# Patient Record
Sex: Male | Born: 1950 | Race: White | Hispanic: Yes | Marital: Married | Smoking: Former smoker
Health system: Southern US, Community
[De-identification: ages and names within clinical notes are randomized; demographics above are authoritative.]

## PROBLEM LIST (undated history)

## (undated) DIAGNOSIS — Z85828 Personal history of other malignant neoplasm of skin: Secondary | ICD-10-CM

## (undated) DIAGNOSIS — K219 Gastro-esophageal reflux disease without esophagitis: Secondary | ICD-10-CM

## (undated) DIAGNOSIS — M5412 Radiculopathy, cervical region: Secondary | ICD-10-CM

## (undated) DIAGNOSIS — I10 Essential (primary) hypertension: Principal | ICD-10-CM

## (undated) DIAGNOSIS — J869 Pyothorax without fistula: Secondary | ICD-10-CM

## (undated) DIAGNOSIS — J189 Pneumonia, unspecified organism: Secondary | ICD-10-CM

## (undated) DIAGNOSIS — K5730 Diverticulosis of large intestine without perforation or abscess without bleeding: Secondary | ICD-10-CM

## (undated) DIAGNOSIS — E785 Hyperlipidemia, unspecified: Secondary | ICD-10-CM

## (undated) DIAGNOSIS — K648 Other hemorrhoids: Secondary | ICD-10-CM

## (undated) DIAGNOSIS — C3490 Malignant neoplasm of unspecified part of unspecified bronchus or lung: Secondary | ICD-10-CM

## (undated) HISTORY — DX: Essential (primary) hypertension: I10

## (undated) HISTORY — DX: Diverticulosis of large intestine without perforation or abscess without bleeding: K57.30

## (undated) HISTORY — DX: Personal history of other malignant neoplasm of skin: Z85.828

## (undated) HISTORY — DX: Radiculopathy, cervical region: M54.12

## (undated) HISTORY — DX: Gastro-esophageal reflux disease without esophagitis: K21.9

## (undated) HISTORY — PX: TONSILLECTOMY: SHX5217

## (undated) HISTORY — DX: Hyperlipidemia, unspecified: E78.5

## (undated) HISTORY — DX: Other hemorrhoids: K64.8

---

## 2003-12-26 ENCOUNTER — Ambulatory Visit: Payer: Self-pay | Admitting: Internal Medicine

## 2004-01-07 ENCOUNTER — Ambulatory Visit: Payer: Self-pay | Admitting: Internal Medicine

## 2004-07-01 ENCOUNTER — Ambulatory Visit: Payer: Self-pay | Admitting: Internal Medicine

## 2004-07-14 ENCOUNTER — Ambulatory Visit: Payer: Self-pay | Admitting: Gastroenterology

## 2004-07-29 ENCOUNTER — Ambulatory Visit: Payer: Self-pay | Admitting: Gastroenterology

## 2004-08-11 ENCOUNTER — Ambulatory Visit: Payer: Self-pay | Admitting: Internal Medicine

## 2004-10-22 ENCOUNTER — Ambulatory Visit: Payer: Self-pay | Admitting: Internal Medicine

## 2004-12-31 ENCOUNTER — Ambulatory Visit: Payer: Self-pay | Admitting: Internal Medicine

## 2005-06-30 ENCOUNTER — Ambulatory Visit: Payer: Self-pay | Admitting: Internal Medicine

## 2005-07-07 ENCOUNTER — Ambulatory Visit: Payer: Self-pay | Admitting: Internal Medicine

## 2005-10-26 ENCOUNTER — Ambulatory Visit: Payer: Self-pay | Admitting: Internal Medicine

## 2005-12-23 ENCOUNTER — Ambulatory Visit: Payer: Self-pay | Admitting: Internal Medicine

## 2006-05-18 ENCOUNTER — Ambulatory Visit: Payer: Self-pay | Admitting: Internal Medicine

## 2006-07-21 ENCOUNTER — Ambulatory Visit: Payer: Self-pay | Admitting: Internal Medicine

## 2006-07-21 LAB — CONVERTED CEMR LAB
Basophils Absolute: 0.1 10*3/uL (ref 0.0–0.1)
Calcium: 9.3 mg/dL (ref 8.4–10.5)
Creatinine, Ser: 1.1 mg/dL (ref 0.4–1.5)
Eosinophils Absolute: 0.1 10*3/uL (ref 0.0–0.6)
GFR calc Af Amer: 89 mL/min
Glucose, Bld: 102 mg/dL — ABNORMAL HIGH (ref 70–99)
HCT: 38 % — ABNORMAL LOW (ref 39.0–52.0)
HDL: 54.9 mg/dL (ref >39.0)
MCHC: 34.9 g/dL (ref 30.0–36.0)
MCV: 99.9 fL (ref 78.0–100.0)
Monocytes Absolute: 0.9 10*3/uL — ABNORMAL HIGH (ref 0.2–0.7)
Platelets: 253 10*3/uL (ref 150–400)
Potassium: 3.9 meq/L (ref 3.5–5.1)
RBC: 3.8 M/uL — ABNORMAL LOW (ref 4.22–5.81)
RDW: 12.6 % (ref 11.5–14.6)
Total Bilirubin: 0.6 mg/dL (ref 0.3–1.2)
Total Protein: 7.3 g/dL (ref 6.0–8.3)
Triglycerides: 103 mg/dL (ref 0–149)
WBC: 8.6 10*3/uL (ref 4.5–10.5)

## 2006-08-04 ENCOUNTER — Ambulatory Visit: Payer: Self-pay | Admitting: Internal Medicine

## 2006-08-15 ENCOUNTER — Encounter: Payer: Self-pay | Admitting: Internal Medicine

## 2006-08-15 DIAGNOSIS — K219 Gastro-esophageal reflux disease without esophagitis: Secondary | ICD-10-CM

## 2006-08-15 DIAGNOSIS — E785 Hyperlipidemia, unspecified: Secondary | ICD-10-CM

## 2006-08-15 DIAGNOSIS — I10 Essential (primary) hypertension: Secondary | ICD-10-CM | POA: Insufficient documentation

## 2006-08-15 DIAGNOSIS — K5730 Diverticulosis of large intestine without perforation or abscess without bleeding: Secondary | ICD-10-CM

## 2006-08-15 DIAGNOSIS — Z85828 Personal history of other malignant neoplasm of skin: Secondary | ICD-10-CM

## 2006-08-15 HISTORY — DX: Essential (primary) hypertension: I10

## 2006-08-15 HISTORY — DX: Hyperlipidemia, unspecified: E78.5

## 2006-08-15 HISTORY — DX: Personal history of other malignant neoplasm of skin: Z85.828

## 2006-08-15 HISTORY — DX: Gastro-esophageal reflux disease without esophagitis: K21.9

## 2006-08-15 HISTORY — DX: Diverticulosis of large intestine without perforation or abscess without bleeding: K57.30

## 2007-02-01 ENCOUNTER — Ambulatory Visit: Payer: Self-pay | Admitting: Internal Medicine

## 2007-02-01 ENCOUNTER — Encounter: Payer: Self-pay | Admitting: Internal Medicine

## 2007-05-03 ENCOUNTER — Ambulatory Visit: Payer: Self-pay | Admitting: Internal Medicine

## 2007-05-03 LAB — CONVERTED CEMR LAB: AST: 33 units/L (ref 0–37)

## 2007-05-09 ENCOUNTER — Telehealth: Payer: Self-pay | Admitting: Internal Medicine

## 2008-02-29 ENCOUNTER — Ambulatory Visit: Payer: Self-pay | Admitting: Internal Medicine

## 2008-02-29 DIAGNOSIS — K648 Other hemorrhoids: Secondary | ICD-10-CM

## 2008-02-29 HISTORY — DX: Other hemorrhoids: K64.8

## 2008-09-12 ENCOUNTER — Telehealth: Payer: Self-pay | Admitting: Internal Medicine

## 2009-07-29 ENCOUNTER — Telehealth: Payer: Self-pay | Admitting: Internal Medicine

## 2009-08-05 ENCOUNTER — Ambulatory Visit: Payer: Self-pay | Admitting: Internal Medicine

## 2009-08-05 LAB — CONVERTED CEMR LAB
Albumin: 4.2 g/dL (ref 3.5–5.2)
Alkaline Phosphatase: 80 units/L (ref 39–117)
BUN: 14 mg/dL (ref 6–23)
Basophils Absolute: 0 10*3/uL (ref 0.0–0.1)
Basophils Relative: 0.3 % (ref 0.0–3.0)
Bilirubin, Direct: 0.1 mg/dL (ref 0.0–0.3)
Blood in Urine, dipstick: NEGATIVE
Calcium: 9.4 mg/dL (ref 8.4–10.5)
Cholesterol: 268 mg/dL — ABNORMAL HIGH (ref 0–200)
Direct LDL: 197.3 mg/dL
Eosinophils Absolute: 0.1 10*3/uL (ref 0.0–0.7)
GFR calc non Af Amer: 77.18 mL/min (ref >60)
Glucose, Bld: 93 mg/dL (ref 70–99)
HCT: 41.1 % (ref 39.0–52.0)
Hemoglobin: 13.9 g/dL (ref 13.0–17.0)
Lymphs Abs: 2.9 10*3/uL (ref 0.7–4.0)
Monocytes Absolute: 1.2 10*3/uL — ABNORMAL HIGH (ref 0.1–1.0)
Neutrophils Relative %: 63.7 % (ref 43.0–77.0)
Nitrite: NEGATIVE
PSA: 1.12 ng/mL (ref 0.10–4.00)
Potassium: 4.1 meq/L (ref 3.5–5.1)
Specific Gravity, Urine: 1.02
Total Bilirubin: 0.5 mg/dL (ref 0.3–1.2)
Triglycerides: 167 mg/dL — ABNORMAL HIGH (ref 0.0–149.0)
WBC Urine, dipstick: NEGATIVE
pH: 7.5

## 2009-08-12 ENCOUNTER — Ambulatory Visit: Payer: Self-pay | Admitting: Internal Medicine

## 2009-08-12 DIAGNOSIS — M5412 Radiculopathy, cervical region: Secondary | ICD-10-CM

## 2009-08-12 HISTORY — DX: Radiculopathy, cervical region: M54.12

## 2010-02-05 ENCOUNTER — Ambulatory Visit: Payer: Self-pay | Admitting: Internal Medicine

## 2010-02-06 LAB — CONVERTED CEMR LAB: AST: 27 units/L (ref 0–37)

## 2010-03-08 ENCOUNTER — Encounter: Payer: Self-pay | Admitting: Internal Medicine

## 2010-03-19 NOTE — Progress Notes (Signed)
Summary:  refill hctz - 14 day supply  Phone Note Call from Patient Call back at Home Phone [phone]   Caller: Patient----live call Summary of Call: pt has cpx on 08-12-2009. please refill HCTZ at [facility] on [address]. he is out. thanks. Initial call taken by: Warnell Forester,  July 29, 2009 10:27 AM    Prescriptions: HYDROCHLOROTHIAZIDE 25 MG  TABS (HYDROCHLOROTHIAZIDE) 1 once daily  #14 x 0   Entered by:   Duard Brady LPN   Authorized by:   Gordy Savers  MD   Signed by:   Duard Brady LPN on 09/81/1914   Method used:   Electronically to        [facility] [address] (retail)       [phone]       [place], [place]  [address]       Ph: [phone]       Fax: [phone]   RxID:   [id number]  faxed 2 wk supply to [facility]   kik

## 2010-03-19 NOTE — Assessment & Plan Note (Signed)
Summary: cpx//ccm   Vital Signs:  Patient profile:   60 year old male Height:      68.75 inches Weight:      163 pounds BMI:     24.33 Temp:     98.2 degrees F oral BP sitting:   128 / 80  (left arm) Cuff size:   regular  Vitals Entered By: Duard Brady LPN (August 12, 2009 3:04 PM) CC: cpx - doing well Is Patient Diabetic? No   CC:  cpx - doing well.  History of Present Illness: 60 year old patient who is seen today for a wellness exam.  Medical problems include hypertension, dyslipidemia, and ongoing tobacco use.  For the past 3 weeks.  She has had shoulder pain and tingling down his right arm involving the thumb and index finger.  Pain is aggravated by head and neck movement, but not shoulder movement.  He has been intolerant of simvastatin in the past and after 3 months.  Tolerated Crestor, poorly  Preventive Screening-Counseling & Management  Alcohol-Tobacco     Smoking Status: current     Smoking Cessation Counseling: yes  Allergies (verified): No Known Drug Allergies  Past History:  Past Medical History: GERD Hyperlipidemia Hypertension Diverticulosis, colon Skin cancer, hx of cervical radiculopathy  Past Surgical History: Tonsillectomy colonoscopy 2006  Family History: Reviewed history from 02/01/2007 and no changes required. 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    Physical Exam  General:  Well-developed,well-nourished,in no acute distress; alert,appropriate and cooperative throughout examination; 124/80 Head:  Normocephalic and atraumatic without obvious abnormalities. No apparent alopecia or balding. Eyes:  No corneal or conjunctival inflammation noted. EOMI. Perrla. Funduscopic exam benign, without hemorrhages, exudates or papilledema. Vision grossly normal. Ears:  External ear exam shows no significant lesions or deformities.  Otoscopic examination reveals clear canals, tympanic membranes are intact bilaterally without bulging, retraction, inflammation or discharge. Hearing is grossly normal bilaterally. Nose:  External nasal examination shows no deformity or inflammation. Nasal mucosa are pink and moist without lesions or exudates. Mouth:  Oral mucosa and oropharynx without lesions or exudates.  Teeth in good repair. Neck:  No deformities, masses, or tenderness noted. Chest Wall:  No deformities, masses, tenderness or gynecomastia noted. Breasts:  No masses or gynecomastia noted Lungs:  Normal respiratory effort, chest expands symmetrically. Lungs are clear to auscultation, no crackles or wheezes. Heart:  Normal rate and regular rhythm. S1 and S2 normal without gallop, murmur, click, rub or other extra sounds. Abdomen:  Bowel sounds positive,abdomen soft and non-tender without masses, organomegaly or hernias noted. Rectal:  No external abnormalities noted. Normal sphincter tone. No rectal masses or tenderness. Genitalia:  Testes bilaterally descended without nodularity, tenderness or masses. No scrotal masses or lesions. No penis lesions or urethral discharge. Prostate:  2+ enlarged.  2+ enlarged.   Msk:  No deformity or scoliosis noted of  thoracic or lumbar  spine.   Pulses:  R and L carotid,radial,femoral,dorsalis pedis and posterior tibial pulses are full and equal bilaterally Extremities:  No clubbing, cyanosis, edema, or deformity noted with normal full range of motion of all joints.   Neurologic:  No cranial nerve deficits noted. Station and gait are normal. Plantar reflexes are down-going bilaterally. DTRs are symmetrical throughout. Sensory, motor and coordinative functions appear intact. Skin:  Intact without suspicious lesions or rashes Cervical Nodes:  No lymphadenopathy noted Axillary Nodes:  No palpable lymphadenopathy Inguinal Nodes:  No significant adenopathy Psych:  Cognition and judgment appear intact. Alert and cooperative with normal attention span and concentration. No apparent delusions, illusions, hallucinations   Impression & Recommendations:  Problem # 1:  Preventive Health Care (ICD-V70.0)  Problem # 2:  CERVICAL RADICULOPATHY, RIGHT (ICD-723.4) will treat  with Depo-Medrol 80 mg IM and observed  Complete Medication List: 1)  Hydrochlorothiazide 25 Mg Tabs (Hydrochlorothiazide) .Marland Kitchen.. 1 once daily 2)  Citalopram Hydrobromide 20 Mg Tabs (Citalopram hydrobromide) .Marland Kitchen.. 1 1/2 once daily 3)  Crestor 10 Mg Tabs (Rosuvastatin calcium) .... One twice weekly 4)  Viagra 100 Mg Tabs (Sildenafil citrate) .... As directed  Other Orders: EKG w/ Interpretation (93000)  Patient Instructions: 1)  Please schedule a follow-up appointment in 6 months. 2)  Limit your Sodium (Salt) to less than 2 grams a day(slightly less than 1/2 a teaspoon) to prevent fluid retention, swelling, or worsening of symptoms. 3)  Tobacco is very bad for your health and your loved ones! You Should stop smoking!. 4)  It is important that you exercise regularly at least 20 minutes 5 times a week. If you develop chest pain, have severe difficulty breathing, or feel very tired , stop exercising immediately and seek medical attention. 5)  Take  400-600mg  of Ibuprofen (Advil, Motrin) with food every 4-6 hours as needed for relief of pain or comfort of fever. Prescriptions: VIAGRA 100 MG  TABS (SILDENAFIL CITRATE) as directed  #12 x 6   Entered and Authorized by:   Gordy Savers  MD   Signed by:   Gordy Savers  MD on 08/12/2009   Method used:   Print then Give to Patient   RxID:   [id number] CRESTOR 10 MG  TABS (ROSUVASTATIN CALCIUM) one twice weekly  #90 x 6   Entered and Authorized by:   Gordy Savers  MD   Signed by:   Gordy Savers  MD on 08/12/2009   Method used:   Print then Give to Patient   RxID:   [id number] CITALOPRAM HYDROBROMIDE 20 MG  TABS (CITALOPRAM HYDROBROMIDE) 1 1/2 once daily  #180 x 6   Entered and Authorized by:   Gordy Savers  MD   Signed by:   Gordy Savers  MD on 08/12/2009   Method used:   Print then Give to Patient   RxID:   [id number] HYDROCHLOROTHIAZIDE 25 MG  TABS (HYDROCHLOROTHIAZIDE) 1 once daily  #90 x 6   Entered and Authorized by:   Gordy Savers  MD   Signed by:   Gordy Savers  MD on 08/12/2009   Method used:   Print then Give to Patient   RxID:   [id number]   Appended Document: cpx//ccm     Allergies: No Known Drug Allergies   Complete Medication List: 1)  Hydrochlorothiazide 25 Mg Tabs (Hydrochlorothiazide) .Marland Kitchen.. 1 once daily 2)  Citalopram Hydrobromide 20 Mg Tabs (Citalopram hydrobromide) .Marland Kitchen.. 1 1/2 once daily 3)  Crestor 10 Mg Tabs (Rosuvastatin calcium) .... One twice weekly 4)  Viagra 100 Mg Tabs (Sildenafil citrate) .... As directed  Other Orders: Depo- Medrol 80mg  (J1040) Admin of Therapeutic Inj  intramuscular or subcutaneous (16109)    Medication Administration  Injection # 1:    Medication: Depo- Medrol 80mg     Diagnosis: CERVICAL RADICULOPATHY, RIGHT (ICD-723.4)    Route: IM    Site: R deltoid    Exp Date: 05/2012    Lot #: [id number]    Mfr: Pharmacia    Patient tolerated injection without  complications    Given by: Duard Brady LPN (August 12, 2009 4:29 PM)  Orders Added: 1)  Depo- Medrol 80mg  [J1040] 2)  Admin of Therapeutic Inj  intramuscular or subcutaneous [60454]

## 2010-03-19 NOTE — Assessment & Plan Note (Signed)
Summary: 6 mnth rov//slm   Vital Signs:  Patient profile:   60 year old male Weight:      167 pounds Temp:     98.1 degrees F oral BP sitting:   124 / 80  (left arm) Cuff size:   regular  Vitals Entered By: Duard Brady LPN (February 05, 2010 8:04 AM) CC: 6 mos rov - doing well Is Patient Diabetic? No   CC:  6 mos rov - doing well.  History of Present Illness: 39 -year-old patient who is seen today for follow-up of his hypertension.He was placed on Crestor in the year due to elevated total cholesterol and LDL of 197.  He has done well on this medication and is asymptomatic.  He has hypertension, treated with diuretic therapy.  No concerns or complaints.  He denies any cardiopulmonary complaints.  Preventive Screening-Counseling & Management  Alcohol-Tobacco     Smoking Status: current  Allergies: No Known Drug Allergies  Past History:  Past Medical History: Reviewed history from 08/12/2009 and no changes required. GERD Hyperlipidemia Hypertension Diverticulosis, colon Skin cancer, hx of cervical radiculopathy  Past Surgical History: Reviewed history from 08/12/2009 and no changes required. Tonsillectomy colonoscopy 2006  Review of Systems  The patient denies anorexia, fever, weight loss, weight gain, vision loss, decreased hearing, hoarseness, chest pain, syncope, dyspnea on exertion, peripheral edema, prolonged cough, headaches, hemoptysis, abdominal pain, melena, hematochezia, severe indigestion/heartburn, hematuria, incontinence, genital sores, muscle weakness, suspicious skin lesions, transient blindness, difficulty walking, depression, unusual weight change, abnormal bleeding, enlarged lymph nodes, angioedema, breast masses, and testicular masses.    Physical Exam  General:  Well-developed,well-nourished,in no acute distress; alert,appropriate and cooperative throughout examination Head:  Normocephalic and atraumatic without obvious abnormalities. No  apparent alopecia or balding. Eyes:  No corneal or conjunctival inflammation noted. EOMI. Perrla. Funduscopic exam benign, without hemorrhages, exudates or papilledema. Vision grossly normal. Mouth:  Oral mucosa and oropharynx without lesions or exudates.  Teeth in good repair. Neck:  No deformities, masses, or tenderness noted. Lungs:  Normal respiratory effort, chest expands symmetrically. Lungs are clear to auscultation, no crackles or wheezes. Heart:  Normal rate and regular rhythm. S1 and S2 normal without gallop, murmur, click, rub or other extra sounds. Abdomen:  Bowel sounds positive,abdomen soft and non-tender without masses, organomegaly or hernias noted. Msk:  No deformity or scoliosis noted of thoracic or lumbar spine.   Pulses:  R and L carotid,radial,femoral,dorsalis pedis and posterior tibial pulses are full and equal bilaterally Extremities:  No clubbing, cyanosis, edema, or deformity noted with normal full range of motion of all joints.     Impression & Recommendations:  Problem # 1:  HYPERTENSION (ICD-401.9)  His updated medication list for this problem includes:    Hydrochlorothiazide 25 Mg Tabs (Hydrochlorothiazide) .Marland Kitchen... 1 once daily    His updated medication list for this problem includes:    Hydrochlorothiazide 25 Mg Tabs (Hydrochlorothiazide) .Marland Kitchen... 1 once daily  Orders: Venipuncture (47829)  Problem # 2:  HYPERLIPIDEMIA (ICD-272.4)  His updated medication list for this problem includes:    Crestor 10 Mg Tabs (Rosuvastatin calcium) ..... One twice weekly    His updated medication list for this problem includes:    Crestor 10 Mg Tabs (Rosuvastatin calcium) ..... One twice weekly  Orders: Venipuncture (56213) TLB-Cholesterol, Total (82465-CHO)  Complete Medication List: 1)  Hydrochlorothiazide 25 Mg Tabs (Hydrochlorothiazide) .Marland Kitchen.. 1 once daily 2)  Citalopram Hydrobromide 20 Mg Tabs (Citalopram hydrobromide) .Marland Kitchen.. 1 1/2 once daily 3)  Crestor 10  Mg Tabs  (Rosuvastatin calcium) .... One twice weekly 4)  Viagra 100 Mg Tabs (Sildenafil citrate) .... As directed  Other Orders: TLB-AST (SGOT) (84450-SGOT) Specimen Handling (14782)  Patient Instructions: 1)  Please schedule a follow-up appointment in 6 months. 2)  Limit your Sodium (Salt) to less than 2 grams a day(slightly less than 1/2 a teaspoon) to prevent fluid retention, swelling, or worsening of symptoms. 3)  It is important that you exercise regularly at least 20 minutes 5 times a week. If you develop chest pain, have severe difficulty breathing, or feel very tired , stop exercising immediately and seek medical attention. Prescriptions: VIAGRA 100 MG  TABS (SILDENAFIL CITRATE) as directed  #12 x 6   Entered and Authorized by:   Gordy Savers  MD   Signed by:   Gordy Savers  MD on 02/05/2010   Method used:   Electronically to        [facility] # [phone]* (retail)       [address]       [place], [place]  [address]       Ph: [phone]       Fax: [phone]   RxID:   [id number] CRESTOR 10 MG  TABS (ROSUVASTATIN CALCIUM) one twice weekly  #90 x 6   Entered and Authorized by:   Gordy Savers  MD   Signed by:   Gordy Savers  MD on 02/05/2010   Method used:   Electronically to        [facility] # [phone]* (retail)       [address]       [place], [place]  [address]       Ph: [phone]       Fax: [phone]   RxID:   [id number] CITALOPRAM HYDROBROMIDE 20 MG  TABS (CITALOPRAM HYDROBROMIDE) 1 1/2 once daily  #180 x 6   Entered and Authorized by:   Gordy Savers  MD   Signed by:   Gordy Savers  MD on 02/05/2010   Method used:   Electronically to        [facility] # [phone]* (retail)       [address]       [place], [place]  [address]       Ph: [phone]       Fax: [phone]   RxID:   [id number] HYDROCHLOROTHIAZIDE 25 MG  TABS  (HYDROCHLOROTHIAZIDE) 1 once daily  #90 x 6   Entered and Authorized by:   Gordy Savers  MD   Signed by:   Gordy Savers  MD on 02/05/2010   Method used:   Electronically to        [facility] # [phone]* (retail)       [address]       [place], [place]  [address]       Ph: [phone]       Fax: [phone]   RxID:   [id number]    Orders Added: 1)  Est. Patient Level III [86761] 2)  Venipuncture [95093] 3)  TLB-AST (SGOT) [84450-SGOT] 4)  TLB-Cholesterol, Total [82465-CHO] 5)  Specimen Handling [99000]

## 2010-06-30 NOTE — Assessment & Plan Note (Signed)
[facility] OFFICE NOTE   Shane Vasquez, Shane Vasquez                       MRN:          [id number]  DATE:08/04/2006                            DOB:          1950/04/11    A 60 year old gentleman seen today for an annual exam.  He has mild  hypertension, hypercholesterolemia.  He has a history of mild reflux.  He has had a remote tonsillectomy.   PAST MEDICAL HISTORY:  Fairly unremarkable.  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   REVIEW OF SYSTEMS:  He did have a screening colonoscopy in 2006 that  revealed diverticular disease only.  He has a number of concerns,  including some increase in anxiety, weight gain since cessation of  smoking.  He also describes some paresthesias involving the hands and  the feet.   Medical regimen includes simvastatin, hydrochlorothiazide, and p.r.n.  Prevacid.   FAMILY HISTORY:  Reviewed.  Father died at 12 of an MI.  Mother died at  68.  Two brothers, one sister.  Positive for renal stone disease, MS.   PHYSICAL EXAMINATION:  GENERAL:  A well-developed male in no acute  distress.  VITAL SIGNS:  Blood pressure 120/84.  HEENT:  Fundi, ears, nose, and throat clear.  NECK:  No bruits or adenopathy.  CHEST:  Clear.  CARDIOVASCULAR:  Normal heart sounds.  No murmurs.  ABDOMEN:  Benign.  No organomegaly.  No bruits.  GENITOURINARY:  External genitalia normal.  EXTREMITIES:  Full peripheral pulses.  RECTAL:  Prostate +2 and benign.  Stool heme negative.   IMPRESSION:  1. Hypertension.  2. Hypercholesterolemia.  3. Weight gain.  4. Reflux.  5. Diverticulosis.   DISPOSITION:  Medical regimen unchanged.  Exercise regimen encouraged.  He is seeing a Veterinary surgeon for his anxiety.  Will reassess his general  status in six months or p.r.n.    Gordy Savers, MD  Electronically Signed   PFK/MedQ  DD: 08/04/2006  DT: 08/05/2006  Job #: [id number]

## 2010-11-03 ENCOUNTER — Other Ambulatory Visit: Payer: Self-pay | Admitting: Internal Medicine

## 2011-01-25 ENCOUNTER — Encounter: Payer: Self-pay | Admitting: Internal Medicine

## 2011-01-25 ENCOUNTER — Ambulatory Visit (INDEPENDENT_AMBULATORY_CARE_PROVIDER_SITE_OTHER): Payer: BC Managed Care – PPO | Admitting: Internal Medicine

## 2011-01-25 DIAGNOSIS — I10 Essential (primary) hypertension: Secondary | ICD-10-CM

## 2011-01-25 DIAGNOSIS — E785 Hyperlipidemia, unspecified: Secondary | ICD-10-CM

## 2011-01-25 MED ORDER — HYDROCHLOROTHIAZIDE 25 MG PO TABS: 25.0000 mg | ORAL_TABLET | Freq: Every day | ORAL | Status: DC

## 2011-01-25 MED ORDER — SILDENAFIL CITRATE 100 MG PO TABS: 100.0000 mg | ORAL_TABLET | Freq: Every day | ORAL | Status: DC | PRN

## 2011-01-25 MED ORDER — CITALOPRAM HYDROBROMIDE 20 MG PO TABS: 20.0000 mg | ORAL_TABLET | Freq: Every day | ORAL | Status: DC

## 2011-01-25 MED ORDER — ROSUVASTATIN CALCIUM 10 MG PO TABS: ORAL_TABLET | ORAL | Status: DC

## 2011-01-25 NOTE — Patient Instructions (Signed)
Limit your sodium (Salt) intake    It is important that you exercise regularly, at least 20 minutes 3 to 4 times per week.  If you develop chest pain or shortness of breath seek  medical attention.  Please check your blood pressure on a regular basis.  If it is consistently greater than 150/90, please make an office appointment.  

## 2011-01-25 NOTE — Progress Notes (Signed)
  Subjective:    Patient ID: Shane Vasquez, male    DOB: [birth year], 60 y.o.   MRN: [id number]  HPI  60 year old patient who is seen today for followup of hypertension and dyslipidemia he remains well controlled on diuretic therapy. Last seen here approximately one year ago he seemed to tolerate twice weekly Crestor. He had been intolerant of other statins in the past. He has discontinued this medication. It is unclear whether this was side effect related. He has ongoing tobacco use    Review of Systems  Constitutional: Negative for fever, chills, appetite change and fatigue.  HENT: Negative for hearing loss, ear pain, congestion, sore throat, trouble swallowing, neck stiffness, dental problem, voice change and tinnitus.   Eyes: Negative for pain, discharge and visual disturbance.  Respiratory: Negative for cough, chest tightness, wheezing and stridor.   Cardiovascular: Negative for chest pain, palpitations and leg swelling.  Gastrointestinal: Negative for nausea, vomiting, abdominal pain, diarrhea, constipation, blood in stool and abdominal distention.  Genitourinary: Negative for urgency, hematuria, flank pain, discharge, difficulty urinating and genital sores.  Musculoskeletal: Negative for myalgias, back pain, joint swelling, arthralgias and gait problem.  Skin: Negative for rash.  Neurological: Negative for dizziness, syncope, speech difficulty, weakness, numbness and headaches.  Hematological: Negative for adenopathy. Does not bruise/bleed easily.  Psychiatric/Behavioral: Negative for behavioral problems and dysphoric mood. The patient is not nervous/anxious.        Objective:   Physical Exam  Constitutional: He is oriented to person, place, and time. He appears well-developed.  HENT:  Head: Normocephalic.  Right Ear: External ear normal.  Left Ear: External ear normal.  Eyes: Conjunctivae and EOM are normal.  Neck: Normal range of motion.  Cardiovascular: Normal rate and  normal heart sounds.   Pulmonary/Chest: Breath sounds normal.  Abdominal: Bowel sounds are normal.  Musculoskeletal: Normal range of motion. He exhibits no edema and no tenderness.  Neurological: He is alert and oriented to person, place, and time.  Psychiatric: He has a normal mood and affect. His behavior is normal.          Assessment & Plan:    Hypertension controlled Dyslipidemia. We'll rechallenge on Crestor 10 mg twice weekly Ongoing tobacco use. Smoking cessation encouraged  CPX in 6 months

## 2011-10-29 ENCOUNTER — Telehealth: Payer: Self-pay | Admitting: Internal Medicine

## 2011-10-29 MED ORDER — EPINEPHRINE 0.3 MG/0.3ML IJ DEVI: 0.3000 mg | Freq: Once | INTRAMUSCULAR | Status: DC

## 2011-10-29 NOTE — Telephone Encounter (Signed)
Caller: Aerik/Patient; Patient Name: Shane Vasquez; PCP: Eleonore Chiquito [facility]); Best Callback Phone Number: [phone] Has Severe Bee Sting Allergy and leaving to go to [place] for 2 weeks- 10/31/11-11/14/11. Needs refill of EPI Pen to take with him. He has not had a physical in several years. Call transfered to Appointments for scheduling. He uses Psychologist, forensic on [address] in [place]. Please have Epi Pen called in -Very Important since leaving in 2 days.

## 2011-10-29 NOTE — Telephone Encounter (Signed)
Pt called and is leaving town on Sunday and is needing as refill of Epipen called in to [facility] at [facility]. Pt said that the date on Epipen was 2007 and Dr Amador Cunas was the perscriber. Pls call in today.

## 2011-10-29 NOTE — Telephone Encounter (Signed)
Spoke with pt- we already had phone note requesting this - but it was sent to [facility] as requested and I took care of it earlier.

## 2011-10-29 NOTE — Telephone Encounter (Signed)
done

## 2011-11-23 ENCOUNTER — Other Ambulatory Visit (INDEPENDENT_AMBULATORY_CARE_PROVIDER_SITE_OTHER): Payer: BC Managed Care – PPO

## 2011-11-23 DIAGNOSIS — Z0000 Encounter for general adult medical examination without abnormal findings: Secondary | ICD-10-CM

## 2011-11-23 LAB — CBC WITH DIFFERENTIAL/PLATELET
Basophils Relative: 0.4 % (ref 0.0–3.0)
Eosinophils Absolute: 0.1 10*3/uL (ref 0.0–0.7)
MCHC: 32.9 g/dL (ref 30.0–36.0)
MCV: 103.5 fl — ABNORMAL HIGH (ref 78.0–100.0)
Monocytes Absolute: 1.2 10*3/uL — ABNORMAL HIGH (ref 0.1–1.0)
Neutrophils Relative %: 66.6 % (ref 43.0–77.0)
Platelets: 280 10*3/uL (ref 150.0–400.0)
RDW: 14 % (ref 11.5–14.6)

## 2011-11-23 LAB — PSA: PSA: 0.71 ng/mL (ref 0.10–4.00)

## 2011-11-23 LAB — BASIC METABOLIC PANEL
BUN: 12 mg/dL (ref 6–23)
CO2: 27 mEq/L (ref 19–32)
Chloride: 102 mEq/L (ref 96–112)
Creatinine, Ser: 1.1 mg/dL (ref 0.4–1.5)
Glucose, Bld: 94 mg/dL (ref 70–99)

## 2011-11-23 LAB — HEPATIC FUNCTION PANEL
Bilirubin, Direct: 0.1 mg/dL (ref 0.0–0.3)
Total Protein: 7.4 g/dL (ref 6.0–8.3)

## 2011-11-23 LAB — POCT URINALYSIS DIPSTICK
Bilirubin, UA: NEGATIVE
Leukocytes, UA: NEGATIVE
Nitrite, UA: NEGATIVE
Protein, UA: NEGATIVE
pH, UA: 6

## 2011-11-23 LAB — LIPID PANEL
Cholesterol: 299 mg/dL — ABNORMAL HIGH (ref 0–200)
Triglycerides: 115 mg/dL (ref 0.0–149.0)

## 2011-11-30 ENCOUNTER — Ambulatory Visit (INDEPENDENT_AMBULATORY_CARE_PROVIDER_SITE_OTHER): Payer: BC Managed Care – PPO | Admitting: Internal Medicine

## 2011-11-30 ENCOUNTER — Encounter: Payer: Self-pay | Admitting: Internal Medicine

## 2011-11-30 VITALS — BP 120/72 | HR 72 | Temp 97.7°F | Resp 18 | Ht 69.25 in | Wt 158.0 lb

## 2011-11-30 DIAGNOSIS — Z0000 Encounter for general adult medical examination without abnormal findings: Secondary | ICD-10-CM

## 2011-11-30 DIAGNOSIS — E785 Hyperlipidemia, unspecified: Secondary | ICD-10-CM

## 2011-11-30 DIAGNOSIS — I10 Essential (primary) hypertension: Secondary | ICD-10-CM

## 2011-11-30 MED ORDER — SILDENAFIL CITRATE 100 MG PO TABS: 100.0000 mg | ORAL_TABLET | Freq: Every day | ORAL | Status: DC | PRN

## 2011-11-30 MED ORDER — CITALOPRAM HYDROBROMIDE 20 MG PO TABS: 20.0000 mg | ORAL_TABLET | Freq: Every day | ORAL | Status: DC

## 2011-11-30 MED ORDER — HYDROCHLOROTHIAZIDE 25 MG PO TABS: 25.0000 mg | ORAL_TABLET | Freq: Every day | ORAL | Status: DC

## 2011-11-30 MED ORDER — ROSUVASTATIN CALCIUM 10 MG PO TABS: ORAL_TABLET | ORAL | Status: DC

## 2011-11-30 NOTE — Progress Notes (Signed)
Subjective:    Patient ID: Shane Vasquez, male    DOB: Apr 27, 1950, 61 y.o.   MRN: [id number]  HPI  61 year old patient who is seen today for a wellness exam. He has hypertension treated with diuretic therapy and also to a history of dyslipidemia. He has been intolerant of other statins in the past but has done well on a twice-weekly regimen of Crestor. He has been out of his medication for some time. No new concerns or complaints  Alcohol-Tobacco  Smoking Status: current   Allergies:  No Known Drug Allergies   Past History:  Past Medical History:  Reviewed history from 08/12/2009 and no changes required.   GERD  Hyperlipidemia  Hypertension  Diverticulosis, colon  Skin cancer, hx of  cervical radiculopathy   Past Surgical History:  Reviewed history from 08/12/2009 and no changes required.   Tonsillectomy  colonoscopy 2006   Past Medical History  Diagnosis Date  . CERVICAL RADICULOPATHY, RIGHT 08/12/2009  . DIVERTICULOSIS, COLON 08/15/2006  . GERD 08/15/2006  . HYPERLIPIDEMIA 08/15/2006  . HYPERTENSION 08/15/2006  . INTERNAL HEMORRHOIDS 02/29/2008  . SKIN CANCER, HX OF 08/15/2006    History   Social History  . Marital Status: Married    Spouse Name: N/A    Number of Children: N/A  . Years of Education: N/A   Occupational History  . Not on file.   Social History Main Topics  . Smoking status: Light Tobacco Smoker -- 1.0 packs/day    Types: Cigarettes  . Smokeless tobacco: Never Used  . Alcohol Use: Yes  . Drug Use: No  . Sexually Active: Not on file   Other Topics Concern  . Not on file   Social History Narrative  . No narrative on file    Past Surgical History  Procedure Date  . Tonsillectomy     Family History  Problem Relation Age of Onset  . Osteoporosis Mother   . COPD Father     No Known Allergies  Current Outpatient Prescriptions on File Prior to Visit  Medication Sig Dispense Refill  . citalopram (CELEXA) 20 MG tablet Take 1 tablet (20  mg total) by mouth daily.  180 tablet  3  . EPINEPHrine (EPI-PEN) 0.3 mg/0.3 mL DEVI Inject 0.3 mLs (0.3 mg total) into the muscle once.  2 Device  1  . hydrochlorothiazide (HYDRODIURIL) 25 MG tablet Take 1 tablet (25 mg total) by mouth daily.  90 tablet  3  . rosuvastatin (CRESTOR) 10 MG tablet 1 tablet twice weekly  30 tablet  11  . sildenafil (VIAGRA) 100 MG tablet Take 1 tablet (100 mg total) by mouth daily as needed. As directed  10 tablet  3    BP 120/72  Pulse 72  Temp 97.7 F (36.5 C) (Oral)  Resp 18  Ht 5' 9.25" (1.759 m)  Wt 158 lb (71.668 kg)  BMI 23.16 kg/m2  SpO2 97%     Review of Systems  Constitutional: Negative for fever, chills, activity change, appetite change and fatigue.  HENT: Negative for hearing loss, ear pain, congestion, rhinorrhea, sneezing, mouth sores, trouble swallowing, neck pain, neck stiffness, dental problem, voice change, sinus pressure and tinnitus.   Eyes: Negative for photophobia, pain, redness and visual disturbance.  Respiratory: Negative for apnea, cough, choking, chest tightness, shortness of breath and wheezing.   Cardiovascular: Negative for chest pain, palpitations and leg swelling.  Gastrointestinal: Negative for nausea, vomiting, abdominal pain, diarrhea, constipation, blood in stool, abdominal distention, anal bleeding and rectal  pain.  Genitourinary: Negative for dysuria, urgency, frequency, hematuria, flank pain, decreased urine volume, discharge, penile swelling, scrotal swelling, difficulty urinating, genital sores and testicular pain.  Musculoskeletal: Negative for myalgias, back pain, joint swelling, arthralgias and gait problem.  Skin: Negative for color change, rash and wound.  Neurological: Negative for dizziness, tremors, seizures, syncope, facial asymmetry, speech difficulty, weakness, light-headedness, numbness and headaches.  Hematological: Negative for adenopathy. Does not bruise/bleed easily.  Psychiatric/Behavioral:  Negative for suicidal ideas, hallucinations, behavioral problems, confusion, disturbed wake/sleep cycle, self-injury, dysphoric mood, decreased concentration and agitation. The patient is not nervous/anxious.        Objective:   Physical Exam  Constitutional: He appears well-developed and well-nourished.  HENT:  Head: Normocephalic and atraumatic.  Right Ear: External ear normal.  Left Ear: External ear normal.  Nose: Nose normal.  Mouth/Throat: Oropharynx is clear and moist.  Eyes: Conjunctivae normal and EOM are normal. Pupils are equal, round, and reactive to light. No scleral icterus.  Neck: Normal range of motion. Neck supple. No JVD present. No thyromegaly present.  Cardiovascular: Regular rhythm, normal heart sounds and intact distal pulses.  Exam reveals no gallop and no friction rub.   No murmur heard. Pulmonary/Chest: Effort normal and breath sounds normal. He exhibits no tenderness.  Abdominal: Soft. Bowel sounds are normal. He exhibits no distension and no mass. There is no tenderness.  Genitourinary: Prostate normal and penis normal.  Musculoskeletal: Normal range of motion. He exhibits no edema and no tenderness.  Lymphadenopathy:    He has no cervical adenopathy.  Neurological: He is alert. He has normal reflexes. No cranial nerve deficit. Coordination normal.  Skin: Skin is warm and dry. No rash noted.  Psychiatric: He has a normal mood and affect. His behavior is normal.          Assessment & Plan:   Preventive health examination Dyslipidemia. We'll resume Crestor 10 mg twice weekly Tobacco abuse. Total smoking cessation encouraged Hypertension well controlled  Recheck 1 year

## 2011-11-30 NOTE — Progress Notes (Deleted)
Patient ID: Shane Vasquez, male   DOB: 1950-11-12, 61 y.o.   MRN: [id number]

## 2011-11-30 NOTE — Patient Instructions (Signed)
Smoking tobacco is very bad for your health. You should stop smoking immediately.  Limit your sodium (Salt) intake  Please check your blood pressure on a regular basis.  If it is consistently greater than 150/90, please make an office appointment.  Return in one year for follow-up

## 2012-12-25 ENCOUNTER — Other Ambulatory Visit: Payer: Self-pay | Admitting: Internal Medicine

## 2013-01-04 ENCOUNTER — Encounter: Payer: Self-pay | Admitting: Internal Medicine

## 2013-01-04 ENCOUNTER — Ambulatory Visit (INDEPENDENT_AMBULATORY_CARE_PROVIDER_SITE_OTHER): Payer: BC Managed Care – PPO | Admitting: Internal Medicine

## 2013-01-04 VITALS — BP 130/90 | HR 66 | Temp 98.1°F | Resp 20 | Ht 69.75 in | Wt 162.0 lb

## 2013-01-04 DIAGNOSIS — E785 Hyperlipidemia, unspecified: Secondary | ICD-10-CM

## 2013-01-04 DIAGNOSIS — I10 Essential (primary) hypertension: Secondary | ICD-10-CM

## 2013-01-04 MED ORDER — SILDENAFIL CITRATE 100 MG PO TABS: 100.0000 mg | ORAL_TABLET | Freq: Every day | ORAL | Status: DC | PRN

## 2013-01-04 MED ORDER — HYDROCHLOROTHIAZIDE 25 MG PO TABS: ORAL_TABLET | ORAL | Status: DC

## 2013-01-04 MED ORDER — CITALOPRAM HYDROBROMIDE 20 MG PO TABS: 20.0000 mg | ORAL_TABLET | Freq: Every day | ORAL | Status: DC

## 2013-01-04 NOTE — Progress Notes (Signed)
Subjective:    Patient ID: Shane Vasquez, male    DOB: 1950/07/21, 62 y.o.   MRN: [id number]  HPI Pre-visit discussion using our clinic review tool. No additional management support is needed unless otherwise documented below in the visit note.  62 year old patient who is seen today for followup. He has history of hypertension and dyslipidemia. Doing quite well. He discontinued tobacco use in July of this year.  Past Medical History  Diagnosis Date  . CERVICAL RADICULOPATHY, RIGHT 08/12/2009  . DIVERTICULOSIS, COLON 08/15/2006  . GERD 08/15/2006  . HYPERLIPIDEMIA 08/15/2006  . HYPERTENSION 08/15/2006  . INTERNAL HEMORRHOIDS 02/29/2008  . SKIN CANCER, HX OF 08/15/2006    History   Social History  . Marital Status: Married    Spouse Name: N/A    Number of Children: N/A  . Years of Education: N/A   Occupational History  . Not on file.   Social History Main Topics  . Smoking status: Former Smoker -- 1.00 packs/day    Types: Cigarettes    Quit date: 08/29/2012  . Smokeless tobacco: Never Used  . Alcohol Use: Yes  . Drug Use: No  . Sexual Activity: Not on file   Other Topics Concern  . Not on file   Social History Narrative  . No narrative on file    Past Surgical History  Procedure Laterality Date  . Tonsillectomy      Family History  Problem Relation Age of Onset  . Osteoporosis Mother   . COPD Father     No Known Allergies  Current Outpatient Prescriptions on File Prior to Visit  Medication Sig Dispense Refill  . desonide (DESOWEN) 0.05 % cream Apply 1 application topically 2 (two) times daily as needed.      Marland Kitchen EPINEPHrine (EPI-PEN) 0.3 mg/0.3 mL DEVI Inject 0.3 mLs (0.3 mg total) into the muscle once.  2 Device  1  . nicotine polacrilex (COMMIT) 2 MG lozenge Place 2 mg inside cheek as needed.      . rosuvastatin (CRESTOR) 10 MG tablet 1 tablet twice weekly  90 tablet  5   No current facility-administered medications on file prior to visit.    BP 130/90   Pulse 66  Temp(Src) 98.1 F (36.7 C) (Oral)  Resp 20  Ht 5' 9.75" (1.772 m)  Wt 162 lb (73.483 kg)  BMI 23.40 kg/m2  SpO2 98%      Review of Systems  Constitutional: Negative for fever, chills, appetite change and fatigue.  HENT: Negative for congestion, dental problem, ear pain, hearing loss, sore throat, tinnitus, trouble swallowing and voice change.   Eyes: Negative for pain, discharge and visual disturbance.  Respiratory: Negative for cough, chest tightness, wheezing and stridor.   Cardiovascular: Negative for chest pain, palpitations and leg swelling.  Gastrointestinal: Negative for nausea, vomiting, abdominal pain, diarrhea, constipation, blood in stool and abdominal distention.  Genitourinary: Negative for urgency, hematuria, flank pain, discharge, difficulty urinating and genital sores.  Musculoskeletal: Negative for arthralgias, back pain, gait problem, joint swelling, myalgias and neck stiffness.  Skin: Negative for rash.  Neurological: Negative for dizziness, syncope, speech difficulty, weakness, numbness and headaches.  Hematological: Negative for adenopathy. Does not bruise/bleed easily.  Psychiatric/Behavioral: Negative for behavioral problems and dysphoric mood. The patient is not nervous/anxious.        Objective:   Physical Exam  Constitutional: He is oriented to person, place, and time. He appears well-developed.  HENT:  Head: Normocephalic.  Right Ear: External ear normal.  Left  Ear: External ear normal.  Eyes: Conjunctivae and EOM are normal.  Neck: Normal range of motion.  Cardiovascular: Normal rate and normal heart sounds.   Pulmonary/Chest: Breath sounds normal.  Abdominal: Bowel sounds are normal.  Musculoskeletal: Normal range of motion. He exhibits no edema and no tenderness.  Neurological: He is alert and oriented to person, place, and time.  Psychiatric: He has a normal mood and affect. His behavior is normal.          Assessment & Plan:    HTN-stable  Dyslipidemia  CPX 12 mon

## 2013-01-04 NOTE — Patient Instructions (Signed)
Limit your sodium (Salt) intake  Please check your blood pressure on a regular basis.  If it is consistently greater than 150/90, please make an office appointment.  Return in one year for follow-up  

## 2013-03-26 ENCOUNTER — Telehealth: Payer: Self-pay | Admitting: Internal Medicine

## 2013-03-26 MED ORDER — CITALOPRAM HYDROBROMIDE 40 MG PO TABS: 40.0000 mg | ORAL_TABLET | Freq: Every day | ORAL | Status: DC

## 2013-03-26 NOTE — Telephone Encounter (Signed)
Dr. Raliegh Ip, Please advise if pt is suppose to take Celexa 20 mg one and half tablets daily?

## 2013-03-26 NOTE — Telephone Encounter (Signed)
Spoke to pt told him Dr. Raliegh Ip is changing Rx for Celexa to 40 mg one tablet daily and new Rx sent to pharmacy. Pt verbalized understanding. Rx sent

## 2013-03-26 NOTE — Telephone Encounter (Signed)
Pt states he takes 1 1/2 tablets of citalopram (CELEXA) 20 MG tablet daily.  However, the rx he has only gives him enough for 1 tab per day.  Pt states the quantity needs to be updated on current script so he doesn't continue running out of medication.  Pharmacy is [facility].  Please follow-up with pt.

## 2013-03-26 NOTE — Telephone Encounter (Signed)
A prescription for generic Celexa 40 mg #90 one daily

## 2014-01-01 ENCOUNTER — Other Ambulatory Visit: Payer: Self-pay | Admitting: Internal Medicine

## 2014-02-12 ENCOUNTER — Ambulatory Visit: Payer: BC Managed Care – PPO | Admitting: Internal Medicine

## 2014-02-13 ENCOUNTER — Encounter: Payer: Self-pay | Admitting: Internal Medicine

## 2014-02-13 ENCOUNTER — Ambulatory Visit (INDEPENDENT_AMBULATORY_CARE_PROVIDER_SITE_OTHER): Payer: Medicare Other | Admitting: Internal Medicine

## 2014-02-13 VITALS — BP 144/90 | HR 79 | Temp 98.2°F | Wt 170.0 lb

## 2014-02-13 DIAGNOSIS — I10 Essential (primary) hypertension: Secondary | ICD-10-CM

## 2014-02-13 DIAGNOSIS — K219 Gastro-esophageal reflux disease without esophagitis: Secondary | ICD-10-CM

## 2014-02-13 DIAGNOSIS — E785 Hyperlipidemia, unspecified: Secondary | ICD-10-CM

## 2014-02-13 DIAGNOSIS — K5730 Diverticulosis of large intestine without perforation or abscess without bleeding: Secondary | ICD-10-CM

## 2014-02-13 LAB — CBC WITH DIFFERENTIAL/PLATELET
BASOS ABS: 0 10*3/uL (ref 0.0–0.1)
Basophils Relative: 0.2 % (ref 0.0–3.0)
EOS ABS: 0.1 10*3/uL (ref 0.0–0.7)
Eosinophils Relative: 0.7 % (ref 0.0–5.0)
HCT: 42.3 % (ref 39.0–52.0)
HEMOGLOBIN: 14.2 g/dL (ref 13.0–17.0)
LYMPHS ABS: 2.9 10*3/uL (ref 0.7–4.0)
Lymphocytes Relative: 24.2 % (ref 12.0–46.0)
MCHC: 33.6 g/dL (ref 30.0–36.0)
MCV: 98.4 fl (ref 78.0–100.0)
Monocytes Absolute: 1.5 10*3/uL — ABNORMAL HIGH (ref 0.1–1.0)
Monocytes Relative: 12.3 % — ABNORMAL HIGH (ref 3.0–12.0)
NEUTROS PCT: 62.6 % (ref 43.0–77.0)
Neutro Abs: 7.6 10*3/uL (ref 1.4–7.7)
Platelets: 286 10*3/uL (ref 150.0–400.0)
RBC: 4.29 Mil/uL (ref 4.22–5.81)
RDW: 13.4 % (ref 11.5–15.5)
WBC: 12.2 10*3/uL — ABNORMAL HIGH (ref 4.0–10.5)

## 2014-02-13 LAB — LIPID PANEL
CHOL/HDL RATIO: 5
Cholesterol: 335 mg/dL — ABNORMAL HIGH (ref 0–200)
HDL: 68.3 mg/dL (ref >39.00)
LDL CALC: 239 mg/dL — AB (ref 0–99)
NONHDL: 266.7
Triglycerides: 140 mg/dL (ref 0.0–149.0)
VLDL: 28 mg/dL (ref 0.0–40.0)

## 2014-02-13 LAB — COMPREHENSIVE METABOLIC PANEL
ALBUMIN: 4.2 g/dL (ref 3.5–5.2)
ALK PHOS: 77 U/L (ref 39–117)
ALT: 27 U/L (ref 0–53)
AST: 34 U/L (ref 0–37)
BUN: 14 mg/dL (ref 6–23)
CALCIUM: 9.7 mg/dL (ref 8.4–10.5)
CO2: 26 mEq/L (ref 19–32)
Chloride: 101 mEq/L (ref 96–112)
Creatinine, Ser: 1.2 mg/dL (ref 0.4–1.5)
GFR: 62.68 mL/min (ref >60.00)
Glucose, Bld: 107 mg/dL — ABNORMAL HIGH (ref 70–99)
Potassium: 4.7 mEq/L (ref 3.5–5.1)
SODIUM: 138 meq/L (ref 135–145)
Total Bilirubin: 0.5 mg/dL (ref 0.2–1.2)
Total Protein: 7.7 g/dL (ref 6.0–8.3)

## 2014-02-13 LAB — TSH: TSH: 1.36 u[IU]/mL (ref 0.35–4.50)

## 2014-02-13 NOTE — Progress Notes (Signed)
Pre visit review using our clinic review tool, if applicable. No additional management support is needed unless otherwise documented below in the visit note. 

## 2014-02-13 NOTE — Progress Notes (Signed)
Subjective:    Patient ID: Shane Vasquez, male    DOB: [birth year], 63 y.o.   MRN: [id number]  HPI  63 year old patient who is seen today for follow-up.  He has a history of hypertension and dyslipidemia.  Doing quite well.  No major concerns or complaints. He did discontinue Crestor.  6 date months ago due to nonspecific muscle twitching.  He has noted some increased urinary frequency but otherwise no concerns.  Blood pressure regimen includes diuretic therapy.  Past Medical History  Diagnosis Date  . CERVICAL RADICULOPATHY, RIGHT 08/12/2009  . DIVERTICULOSIS, COLON 08/15/2006  . GERD 08/15/2006  . HYPERLIPIDEMIA 08/15/2006  . HYPERTENSION 08/15/2006  . INTERNAL HEMORRHOIDS 02/29/2008  . SKIN CANCER, HX OF 08/15/2006    History   Social History  . Marital Status: Married    Spouse Name: N/A    Number of Children: N/A  . Years of Education: N/A   Occupational History  . Not on file.   Social History Main Topics  . Smoking status: Former Smoker -- 1.00 packs/day    Types: Cigarettes    Quit date: 08/29/2012  . Smokeless tobacco: Never Used  . Alcohol Use: Yes  . Drug Use: No  . Sexual Activity: Not on file   Other Topics Concern  . Not on file   Social History Narrative    Past Surgical History  Procedure Laterality Date  . Tonsillectomy      Family History  Problem Relation Age of Onset  . Osteoporosis Mother   . COPD Father     No Known Allergies  Current Outpatient Prescriptions on File Prior to Visit  Medication Sig Dispense Refill  . citalopram (CELEXA) 40 MG tablet Take 1 tablet (40 mg total) by mouth daily. 90 tablet 3  . desonide (DESOWEN) 0.05 % cream Apply 1 application topically 2 (two) times daily as needed.    Marland Kitchen EPINEPHrine (EPI-PEN) 0.3 mg/0.3 mL DEVI Inject 0.3 mLs (0.3 mg total) into the muscle once. 2 Device 1  . hydrochlorothiazide (HYDRODIURIL) 25 MG tablet TAKE 1 TABLET BY MOUTH EVERY DAY 90 tablet 0  . nicotine polacrilex (COMMIT) 2 MG  lozenge Place 2 mg inside cheek as needed.    . sildenafil (VIAGRA) 100 MG tablet Take 1 tablet (100 mg total) by mouth daily as needed. As directed 10 tablet 3  . rosuvastatin (CRESTOR) 10 MG tablet 1 tablet twice weekly 90 tablet 5   No current facility-administered medications on file prior to visit.    BP 144/90 mmHg  Pulse 79  Temp(Src) 98.2 F (36.8 C) (Oral)  Wt 170 lb (77.111 kg)  SpO2 96%     Review of Systems  Constitutional: Negative for fever, chills, appetite change and fatigue.  HENT: Negative for congestion, dental problem, ear pain, hearing loss, sore throat, tinnitus, trouble swallowing and voice change.   Eyes: Negative for pain, discharge and visual disturbance.  Respiratory: Negative for cough, chest tightness, wheezing and stridor.   Cardiovascular: Negative for chest pain, palpitations and leg swelling.  Gastrointestinal: Negative for nausea, vomiting, abdominal pain, diarrhea, constipation, blood in stool and abdominal distention.  Genitourinary: Positive for frequency. Negative for urgency, hematuria, flank pain, discharge, difficulty urinating and genital sores.  Musculoskeletal: Negative for myalgias, back pain, joint swelling, arthralgias, gait problem and neck stiffness.  Skin: Negative for rash.  Neurological: Negative for dizziness, syncope, speech difficulty, weakness, numbness and headaches.  Hematological: Negative for adenopathy. Does not bruise/bleed easily.  Psychiatric/Behavioral: Negative for  behavioral problems and dysphoric mood. The patient is not nervous/anxious.        Objective:   Physical Exam  Constitutional: He is oriented to person, place, and time. He appears well-developed.  Blood pressure 134/90  HENT:  Head: Normocephalic.  Right Ear: External ear normal.  Left Ear: External ear normal.  Eyes: Conjunctivae and EOM are normal.  Neck: Normal range of motion.  Cardiovascular: Normal rate and normal heart sounds.     Pulmonary/Chest: Breath sounds normal.  Abdominal: Bowel sounds are normal.  Musculoskeletal: Normal range of motion. He exhibits no edema or tenderness.  Neurological: He is alert and oriented to person, place, and time.  Psychiatric: He has a normal mood and affect. His behavior is normal.          Assessment & Plan:   Hypertension.  Continue diuretic therapy.  Continue home blood pressure monitoring. Dyslipidemia.  Will check a lipid profile off therapy Urinary frequency.  Will observe at this time.  May need to consider discontinuation of diuretic therapy   CPX 6 months

## 2014-02-13 NOTE — Patient Instructions (Signed)
Limit your sodium (Salt) intake  Please check your blood pressure on a regular basis.  If it is consistently greater than 150/90, please make an office appointment.  Return in 6 months for follow-up   

## 2014-02-14 ENCOUNTER — Telehealth: Payer: Self-pay | Admitting: Internal Medicine

## 2014-02-14 NOTE — Telephone Encounter (Signed)
emmi emailed °

## 2014-02-19 ENCOUNTER — Telehealth: Payer: Self-pay | Admitting: Internal Medicine

## 2014-02-19 MED ORDER — SILDENAFIL CITRATE 100 MG PO TABS: 100.0000 mg | ORAL_TABLET | Freq: Every day | ORAL | Status: DC | PRN

## 2014-02-19 MED ORDER — CITALOPRAM HYDROBROMIDE 40 MG PO TABS: 40.0000 mg | ORAL_TABLET | Freq: Every day | ORAL | Status: DC

## 2014-02-19 MED ORDER — HYDROCHLOROTHIAZIDE 25 MG PO TABS
25.0000 mg | ORAL_TABLET | Freq: Every day | ORAL | Status: DC
Start: 2014-02-19 — End: 2015-05-09

## 2014-02-19 MED ORDER — ROSUVASTATIN CALCIUM 10 MG PO TABS: ORAL_TABLET | ORAL | Status: DC

## 2014-02-19 NOTE — Telephone Encounter (Signed)
Patient states his re-fills was not sent on 02/13/14 after his appointment.  He said he is completely out and need the following sent to [id number] - [place], [place] - [address] AT [place]:  citalopram (CELEXA) 40 MG tablet hydrochlorothiazide (HYDRODIURIL) 25 MG tablet rosuvastatin (CRESTOR) 10 MG tablet(Expired)V sildenafil (VIAGRA) 100 MG tablet

## 2014-02-19 NOTE — Telephone Encounter (Signed)
Spoke to pt, told him labs showed Cholesterol was elevated need to go back on Crestor per Dr. Raliegh Ip. Also Rx's were sent to pharmacy. Pt verbalized understanding.

## 2014-07-18 ENCOUNTER — Telehealth: Payer: Self-pay | Admitting: Internal Medicine

## 2014-07-18 NOTE — Telephone Encounter (Signed)
[facility] - Client [facility] Patient Name: Shane Vasquez DOB: 08/25/1960 Initial Comment Caller states c/o fever, flu symptoms Nurse Assessment Nurse: Erlene Quan, RN, Manuela Schwartz Date/Time Eilene Ghazi Time): 07/18/2014 1:36:15 PM Confirm and document reason for call. If symptomatic, describe symptoms. ---Caller states c/o fever, flu symptoms - no sure what temp is just feels he may have a fever - no runny nose cough is mild but feels congested like he can not get it loose - achy joints - no vomiting no diarrhea - eating and drinking well Has the patient traveled out of the country within the last 30 days? ---Not Applicable Does the patient require triage? ---Yes Related visit to physician within the last 2 weeks? ---No Does the PT have any chronic conditions? (i.e. diabetes, asthma, etc.) ---Yes List chronic conditions. ---high blood pressure Guidelines Guideline Title Affirmed Question Affirmed Notes Cough - Acute Productive Cough (all triage questions negative) Final Disposition User [place], RN, Manuela Schwartz

## 2014-07-18 NOTE — Telephone Encounter (Signed)
FYI

## 2014-07-21 ENCOUNTER — Emergency Department (HOSPITAL_COMMUNITY): Payer: Medicare Other

## 2014-07-21 ENCOUNTER — Inpatient Hospital Stay (HOSPITAL_COMMUNITY)
Admission: EM | Admit: 2014-07-21 | Discharge: 2014-08-01 | DRG: 853 | Disposition: A | Discharge status: Home / Self Care | Payer: Medicare Other | Attending: Internal Medicine | Admitting: Internal Medicine

## 2014-07-21 ENCOUNTER — Encounter (HOSPITAL_COMMUNITY): Payer: Self-pay | Admitting: Emergency Medicine

## 2014-07-21 DIAGNOSIS — J189 Pneumonia, unspecified organism: Secondary | ICD-10-CM | POA: Diagnosis present

## 2014-07-21 DIAGNOSIS — T501X5A Adverse effect of loop [high-ceiling] diuretics, initial encounter: Secondary | ICD-10-CM | POA: Diagnosis present

## 2014-07-21 DIAGNOSIS — E785 Hyperlipidemia, unspecified: Secondary | ICD-10-CM | POA: Diagnosis not present

## 2014-07-21 DIAGNOSIS — J9811 Atelectasis: Secondary | ICD-10-CM | POA: Diagnosis not present

## 2014-07-21 DIAGNOSIS — E860 Dehydration: Secondary | ICD-10-CM | POA: Diagnosis not present

## 2014-07-21 DIAGNOSIS — Z9889 Other specified postprocedural states: Secondary | ICD-10-CM

## 2014-07-21 DIAGNOSIS — Z23 Encounter for immunization: Secondary | ICD-10-CM | POA: Diagnosis not present

## 2014-07-21 DIAGNOSIS — D509 Iron deficiency anemia, unspecified: Secondary | ICD-10-CM | POA: Diagnosis not present

## 2014-07-21 DIAGNOSIS — K5900 Constipation, unspecified: Secondary | ICD-10-CM | POA: Diagnosis not present

## 2014-07-21 DIAGNOSIS — I5031 Acute diastolic (congestive) heart failure: Secondary | ICD-10-CM | POA: Diagnosis present

## 2014-07-21 DIAGNOSIS — J181 Lobar pneumonia, unspecified organism: Secondary | ICD-10-CM | POA: Diagnosis present

## 2014-07-21 DIAGNOSIS — Z87891 Personal history of nicotine dependence: Secondary | ICD-10-CM | POA: Diagnosis not present

## 2014-07-21 DIAGNOSIS — J9621 Acute and chronic respiratory failure with hypoxia: Secondary | ICD-10-CM | POA: Diagnosis present

## 2014-07-21 DIAGNOSIS — J90 Pleural effusion, not elsewhere classified: Secondary | ICD-10-CM | POA: Diagnosis not present

## 2014-07-21 DIAGNOSIS — J869 Pyothorax without fistula: Secondary | ICD-10-CM | POA: Diagnosis not present

## 2014-07-21 DIAGNOSIS — J948 Other specified pleural conditions: Secondary | ICD-10-CM | POA: Diagnosis not present

## 2014-07-21 DIAGNOSIS — Z9689 Presence of other specified functional implants: Secondary | ICD-10-CM

## 2014-07-21 DIAGNOSIS — J9690 Respiratory failure, unspecified, unspecified whether with hypoxia or hypercapnia: Secondary | ICD-10-CM

## 2014-07-21 DIAGNOSIS — I10 Essential (primary) hypertension: Secondary | ICD-10-CM | POA: Diagnosis present

## 2014-07-21 DIAGNOSIS — R509 Fever, unspecified: Secondary | ICD-10-CM | POA: Diagnosis present

## 2014-07-21 DIAGNOSIS — A419 Sepsis, unspecified organism: Secondary | ICD-10-CM | POA: Diagnosis present

## 2014-07-21 DIAGNOSIS — J449 Chronic obstructive pulmonary disease, unspecified: Secondary | ICD-10-CM | POA: Diagnosis not present

## 2014-07-21 DIAGNOSIS — N179 Acute kidney failure, unspecified: Secondary | ICD-10-CM | POA: Diagnosis present

## 2014-07-21 DIAGNOSIS — J939 Pneumothorax, unspecified: Secondary | ICD-10-CM

## 2014-07-21 DIAGNOSIS — F329 Major depressive disorder, single episode, unspecified: Secondary | ICD-10-CM | POA: Diagnosis not present

## 2014-07-21 DIAGNOSIS — J9601 Acute respiratory failure with hypoxia: Secondary | ICD-10-CM | POA: Diagnosis not present

## 2014-07-21 DIAGNOSIS — R091 Pleurisy: Secondary | ICD-10-CM | POA: Diagnosis not present

## 2014-07-21 DIAGNOSIS — D638 Anemia in other chronic diseases classified elsewhere: Secondary | ICD-10-CM | POA: Diagnosis not present

## 2014-07-21 DIAGNOSIS — E876 Hypokalemia: Secondary | ICD-10-CM | POA: Diagnosis not present

## 2014-07-21 DIAGNOSIS — Z85828 Personal history of other malignant neoplasm of skin: Secondary | ICD-10-CM

## 2014-07-21 DIAGNOSIS — J9620 Acute and chronic respiratory failure, unspecified whether with hypoxia or hypercapnia: Secondary | ICD-10-CM

## 2014-07-21 DIAGNOSIS — J918 Pleural effusion in other conditions classified elsewhere: Secondary | ICD-10-CM

## 2014-07-21 DIAGNOSIS — K219 Gastro-esophageal reflux disease without esophagitis: Secondary | ICD-10-CM | POA: Diagnosis present

## 2014-07-21 DIAGNOSIS — I959 Hypotension, unspecified: Secondary | ICD-10-CM | POA: Diagnosis not present

## 2014-07-21 DIAGNOSIS — D72829 Elevated white blood cell count, unspecified: Secondary | ICD-10-CM | POA: Diagnosis not present

## 2014-07-21 DIAGNOSIS — D649 Anemia, unspecified: Secondary | ICD-10-CM | POA: Diagnosis not present

## 2014-07-21 DIAGNOSIS — R079 Chest pain, unspecified: Secondary | ICD-10-CM | POA: Diagnosis not present

## 2014-07-21 LAB — COMPREHENSIVE METABOLIC PANEL
ALK PHOS: 89 U/L (ref 38–126)
ALT: 25 U/L (ref 17–63)
AST: 23 U/L (ref 15–41)
Albumin: 3.2 g/dL — ABNORMAL LOW (ref 3.5–5.0)
Anion gap: 11 (ref 5–15)
BUN: 22 mg/dL — ABNORMAL HIGH (ref 6–20)
CALCIUM: 9.1 mg/dL (ref 8.9–10.3)
CO2: 26 mmol/L (ref 22–32)
Chloride: 98 mmol/L — ABNORMAL LOW (ref 101–111)
Creatinine, Ser: 1.33 mg/dL — ABNORMAL HIGH (ref 0.61–1.24)
GFR calc Af Amer: >60 mL/min (ref >60)
GFR, EST NON AFRICAN AMERICAN: 55 mL/min — AB (ref >60)
Glucose, Bld: 116 mg/dL — ABNORMAL HIGH (ref 65–99)
Potassium: 3.7 mmol/L (ref 3.5–5.1)
Sodium: 135 mmol/L (ref 135–145)
Total Bilirubin: 0.3 mg/dL (ref 0.3–1.2)
Total Protein: 8 g/dL (ref 6.5–8.1)

## 2014-07-21 LAB — CBC
HCT: 36 % — ABNORMAL LOW (ref 39.0–52.0)
HEMOGLOBIN: 12.5 g/dL — AB (ref 13.0–17.0)
MCH: 33.2 pg (ref 26.0–34.0)
MCHC: 34.7 g/dL (ref 30.0–36.0)
MCV: 95.5 fL (ref 78.0–100.0)
Platelets: 391 10*3/uL (ref 150–400)
RBC: 3.77 MIL/uL — AB (ref 4.22–5.81)
RDW: 13 % (ref 11.5–15.5)
WBC: 28.9 10*3/uL — AB (ref 4.0–10.5)

## 2014-07-21 LAB — URINALYSIS, ROUTINE W REFLEX MICROSCOPIC
GLUCOSE, UA: NEGATIVE mg/dL
Ketones, ur: NEGATIVE mg/dL
Leukocytes, UA: NEGATIVE
NITRITE: NEGATIVE
PH: 5.5 (ref 5.0–8.0)
PROTEIN: 100 mg/dL — AB
Specific Gravity, Urine: 1.039 — ABNORMAL HIGH (ref 1.005–1.030)
Urobilinogen, UA: 0.2 mg/dL (ref 0.0–1.0)

## 2014-07-21 LAB — URINE MICROSCOPIC-ADD ON

## 2014-07-21 LAB — CK: Total CK: 36 U/L — ABNORMAL LOW (ref 49–397)

## 2014-07-21 LAB — D-DIMER, QUANTITATIVE: D-Dimer, Quant: 1.99 ug/mL-FEU — ABNORMAL HIGH (ref 0.00–0.48)

## 2014-07-21 LAB — I-STAT TROPONIN, ED: Troponin i, poc: 0 ng/mL (ref 0.00–0.08)

## 2014-07-21 LAB — LACTIC ACID, PLASMA: LACTIC ACID, VENOUS: 0.9 mmol/L (ref 0.5–2.0)

## 2014-07-21 MED ORDER — IOHEXOL 350 MG/ML SOLN
100.0000 mL | Freq: Once | INTRAVENOUS | Status: AC | PRN
  Administered 2014-07-21: 80 mL via INTRAVENOUS

## 2014-07-21 MED ORDER — ONDANSETRON HCL 4 MG/2ML IJ SOLN: 4.0000 mg | Freq: Four times a day (QID) | INTRAMUSCULAR | Status: DC | PRN

## 2014-07-21 MED ORDER — SODIUM CHLORIDE 0.9 % IV BOLUS (SEPSIS)
1000.0000 mL | Freq: Once | INTRAVENOUS | Status: AC
  Administered 2014-07-21: 1000 mL via INTRAVENOUS

## 2014-07-21 MED ORDER — HYDROCHLOROTHIAZIDE 25 MG PO TABS
25.0000 mg | ORAL_TABLET | Freq: Every day | ORAL | Status: DC
  Administered 2014-07-22 – 2014-08-01 (×10): 25 mg via ORAL
  Filled 2014-07-21 (×12): qty 1

## 2014-07-21 MED ORDER — ACETAMINOPHEN 325 MG PO TABS
650.0000 mg | ORAL_TABLET | Freq: Four times a day (QID) | ORAL | Status: DC | PRN
  Administered 2014-07-21: 650 mg via ORAL
  Filled 2014-07-21: qty 2

## 2014-07-21 MED ORDER — IBUPROFEN 200 MG PO TABS: 200.0000 mg | ORAL_TABLET | ORAL | Status: DC | PRN

## 2014-07-21 MED ORDER — ONDANSETRON HCL 4 MG PO TABS: 4.0000 mg | ORAL_TABLET | Freq: Four times a day (QID) | ORAL | Status: DC | PRN

## 2014-07-21 MED ORDER — ACETAMINOPHEN 650 MG RE SUPP: 650.0000 mg | Freq: Four times a day (QID) | RECTAL | Status: DC | PRN

## 2014-07-21 MED ORDER — ACETAMINOPHEN 500 MG PO TABS
1000.0000 mg | ORAL_TABLET | Freq: Three times a day (TID) | ORAL | Status: DC | PRN
  Administered 2014-07-21: 500 mg via ORAL
  Administered 2014-07-22: 1000 mg via ORAL
  Filled 2014-07-21 (×2): qty 2

## 2014-07-21 MED ORDER — KETOROLAC TROMETHAMINE 30 MG/ML IJ SOLN
30.0000 mg | Freq: Once | INTRAMUSCULAR | Status: AC
  Administered 2014-07-21: 30 mg via INTRAVENOUS
  Filled 2014-07-21: qty 1

## 2014-07-21 MED ORDER — SODIUM CHLORIDE 0.9 % IV SOLN: INTRAVENOUS | Status: DC

## 2014-07-21 MED ORDER — SODIUM CHLORIDE 0.9 % IV SOLN
INTRAVENOUS | Status: DC
  Administered 2014-07-21: 14:00:00 via INTRAVENOUS
  Administered 2014-07-22: 125 mL/h via INTRAVENOUS

## 2014-07-21 MED ORDER — ENOXAPARIN SODIUM 40 MG/0.4ML ~~LOC~~ SOLN
40.0000 mg | SUBCUTANEOUS | Status: DC
  Administered 2014-07-21 – 2014-07-31 (×10): 40 mg via SUBCUTANEOUS
  Filled 2014-07-21 (×13): qty 0.4

## 2014-07-21 MED ORDER — AZITHROMYCIN 500 MG PO TABS
500.0000 mg | ORAL_TABLET | ORAL | Status: DC
  Administered 2014-07-22 – 2014-07-26 (×5): 500 mg via ORAL
  Filled 2014-07-21 (×7): qty 1

## 2014-07-21 MED ORDER — KETOROLAC TROMETHAMINE 30 MG/ML IJ SOLN: 30.0000 mg | Freq: Four times a day (QID) | INTRAMUSCULAR | Status: DC | PRN

## 2014-07-21 MED ORDER — CEFTRIAXONE SODIUM IN DEXTROSE 20 MG/ML IV SOLN
1.0000 g | INTRAVENOUS | Status: DC
  Administered 2014-07-22 – 2014-07-26 (×5): 1 g via INTRAVENOUS
  Filled 2014-07-21 (×6): qty 50

## 2014-07-21 MED ORDER — ACETAMINOPHEN 500 MG PO TABS
1000.0000 mg | ORAL_TABLET | Freq: Once | ORAL | Status: AC
  Administered 2014-07-21: 1000 mg via ORAL
  Filled 2014-07-21: qty 2

## 2014-07-21 MED ORDER — DEXTROSE 5 % IV SOLN
500.0000 mg | Freq: Once | INTRAVENOUS | Status: AC
  Administered 2014-07-21: 500 mg via INTRAVENOUS
  Filled 2014-07-21: qty 500

## 2014-07-21 MED ORDER — DEXTROSE 5 % IV SOLN
1.0000 g | Freq: Once | INTRAVENOUS | Status: AC
  Administered 2014-07-21: 1 g via INTRAVENOUS
  Filled 2014-07-21: qty 10

## 2014-07-21 MED ORDER — CITALOPRAM HYDROBROMIDE 40 MG PO TABS
40.0000 mg | ORAL_TABLET | Freq: Every day | ORAL | Status: DC
  Administered 2014-07-21: 20 mg via ORAL
  Filled 2014-07-21 (×2): qty 1

## 2014-07-21 NOTE — H&P (Addendum)
[facility] History and Physical  MERVILLE HIJAZI JEH:[id number] DOB: 10-24-1950 DOA: 07/21/2014  Referring physician: Dorie Rank PCP: [name], MD   Chief Complaint:  Liver with chills for 4 days  HPI:  64 year old male with history of GERD, hypertension and hyperlipidemia who presented to the ED with 4 day history of fever with chills and sweating since past 4 days. He reports nonproductive cough with left lateral and posterior chest pain with movement and deep inspiration and coughing. He reports having frequent fever for the past 4 days with maximum temperature of 102.3F which would response to Tylenol. Reports poor appetite. Patient denies headache, dizziness,nausea , vomiting, malaise,  palpitations, SOB, abdominal pain, bowel or urinary symptoms. Denies change in weight . Denies recent travel or sick contact.  Course in the ED Patient was afebrile, remaining vitals were stable. Blood will done showed significant leukocytosis with WBC of 28.9. Hemoglobin of 12.5, normal platelets,  Chemistry done showed sodium 135, K 3.7, BUN of 23. Cr 1.33. Lactic acid of 0.9. Chest x-ray done showed left lower lobe consolidation suggestive of pneumonia. D-dimer was checked showed elevated 1.99 this was followed by CT angiogram of the chest which was negative for PE but showed sensitive pneumonia in the left lower lobe with reactive left hilar adenopathy. Also a small loculated left pleural effusion.  Patient given IV  Rocephin and azithromycin, a 2 L IV normal saline bolus and hospitalist admission requested to medical floor.  Review of Systems:  Constitutional: fever, chills, diaphoresis, appetite change and fatigue.  HEENT: Nonproductive cough, Denies visual or hearing symptoms, difficulty swallowing, denies congestion, neck pain or stiffness Respiratory: Denies SOB, DOE, cough, chest tightness,  and wheezing.   Cardiovascular: Pleuritic chest pain, denies palpitations and leg  swelling.  Gastrointestinal: Denies nausea, vomiting, abdominal pain, diarrhea, constipation, blood in stool and abdominal distention.  Genitourinary: Denies dysuria,  hematuria, flank pain and difficulty urinating.  Endocrine: Denies: hot or cold intolerance, polyuria, polydipsia. Musculoskeletal: Denies myalgias, back pain, joint pain or swelling Skin: Denies pallor, rash and wound.  Neurological: Denies dizziness, seizures, syncope, weakness, light-headedness, numbness and headaches.  Hematological: Denies adenopathy.  Psychiatric/Behavioral: Denies confusion   Past Medical History  Diagnosis Date  . CERVICAL RADICULOPATHY, RIGHT 08/12/2009  . DIVERTICULOSIS, COLON 08/15/2006  . GERD 08/15/2006  . HYPERLIPIDEMIA 08/15/2006  . HYPERTENSION 08/15/2006  . INTERNAL HEMORRHOIDS 02/29/2008  . SKIN CANCER, HX OF 08/15/2006   Past Surgical History  Procedure Laterality Date  . Tonsillectomy     Social History:  reports that he quit smoking about 22 months ago. His smoking use included Cigarettes. He smoked 1.00 pack per day. He has never used smokeless tobacco. He reports that he drinks alcohol. He reports that he does not use illicit drugs.  Allergies  Allergen Reactions  . Bee Venom Anaphylaxis and Nausea And Vomiting    Family History  Problem Relation Age of Onset  . Osteoporosis Mother   . COPD Father     Prior to Admission medications   Medication Sig Start Date End Date Taking? Authorizing Provider  acetaminophen (TYLENOL) 500 MG tablet Take 1,000 mg by mouth every 6 (six) hours as needed for mild pain, moderate pain, fever or headache.   Yes Historical Provider, MD  citalopram (CELEXA) 40 MG tablet Take 1 tablet (40 mg total) by mouth daily. 02/19/14  Yes Marletta Lor, MD  EPINEPHrine (EPI-PEN) 0.3 mg/0.3 mL DEVI Inject 0.3 mLs (0.3 mg total) into the muscle once. 10/29/11  Yes Marletta Lor, MD  hydrochlorothiazide (HYDRODIURIL) 25 MG tablet Take 1 tablet (25 mg  total) by mouth daily. 02/19/14  Yes Marletta Lor, MD  ibuprofen (ADVIL,MOTRIN) 200 MG tablet Take 200 mg by mouth every 4 (four) hours as needed for fever, headache, moderate pain or cramping.   Yes Historical Provider, MD  sildenafil (VIAGRA) 100 MG tablet Take 1 tablet (100 mg total) by mouth daily as needed. As directed 02/19/14  Yes Marletta Lor, MD  rosuvastatin (CRESTOR) 10 MG tablet 1 tablet twice weekly Patient not taking: Reported on 07/21/2014 02/19/14 06/20/15  Marletta Lor, MD     Physical Exam:  Filed Vitals:   07/21/14 1125 07/21/14 1335  BP: 146/68 116/68  Pulse: 87 87  Temp: 98.4 F (36.9 C) 99.7 F (37.6 C)  TempSrc: Oral Oral  Resp: 16 18  SpO2: 97% 97%    Constitutional: Vital signs reviewed.  Patient is a well-developed and well-nourished in no acute distress. HEENT: no pallor, no icterus, moist oral mucosa, no cervical lymphadenopathy Cardiovascular: RRR, S1 normal, S2 normal, no MRG Chest: CTAB, no wheezes, rales, or rhonchi Abdominal: Soft. Non-tender, non-distended, bowel sounds are normal, no masses, organomegaly, or guarding present.  GU: no CVA tenderness Ext: warm, no edema Neurological: A&O x3, non focal  Labs on Admission:  Basic Metabolic Panel:  Recent Labs Lab 07/21/14 1134  NA 135  K 3.7  CL 98*  CO2 26  GLUCOSE 116*  BUN 22*  CREATININE 1.33*  CALCIUM 9.1   Liver Function Tests:  Recent Labs Lab 07/21/14 1134  AST 23  ALT 25  ALKPHOS 89  BILITOT 0.3  PROT 8.0  ALBUMIN 3.2*   No results for input(s): LIPASE, AMYLASE in the last 168 hours. No results for input(s): AMMONIA in the last 168 hours. CBC:  Recent Labs Lab 07/21/14 1134  WBC 28.9*  HGB 12.5*  HCT 36.0*  MCV 95.5  PLT 391   Cardiac Enzymes:  Recent Labs Lab 07/21/14 1217  CKTOTAL 36*   BNP: Invalid input(s): POCBNP CBG: No results for input(s): GLUCAP in the last 168 hours.  Radiological Exams on Admission: Dg Chest 2  View  07/21/2014   CLINICAL DATA:  Fever, night sweats, LEFT flank pain for 5 days, LEFT side pain with deep breathing, history hypertension, former smoker  EXAM: CHEST  2 VIEW  COMPARISON:  None  FINDINGS: Upper normal heart size.  Mediastinal contours and pulmonary vascularity normal.  Subsegmental atelectasis RIGHT base.  LEFT lower lobe consolidation consistent with pneumonia.  Upper lungs clear.  Underlying emphysematous and bronchitic changes.  No pleural effusion or pneumothorax.  Bones demineralized.  IMPRESSION: Question COPD changes with mi subsegmental atelectasis RIGHT base.  LEFT lower lobe consolidation consistent with pneumonia.  Followup PA and lateral chest X-ray is recommended in 3-4 weeks following trial of antibiotic therapy to ensure resolution and exclude underlying malignancy.   Electronically Signed   By: Lavonia Dana M.D.   On: 07/21/2014 13:28   Ct Angio Chest Pe W/cm &/or Wo Cm  07/21/2014   CLINICAL DATA:  Fever and left-sided flank pain. Left lower lobe pneumonia.  EXAM: CT ANGIOGRAPHY CHEST WITH CONTRAST  TECHNIQUE: Multidetector CT imaging of the chest was performed using the standard protocol during bolus administration of intravenous contrast. Multiplanar CT image reconstructions and MIPs were obtained to evaluate the vascular anatomy.  CONTRAST:  67m OMNIPAQUE IOHEXOL 350 MG/ML SOLN  COMPARISON:  Chest x-ray dated 07/21/2014  FINDINGS:  There are no pulmonary emboli. There is a 7 x 6 by 6 cm area of consolidation in the left lower lobe with adjacent areas of less well-defined consolidation. There is atelectasis at the right lung base. There are several slightly enlarged lymph nodes in the left hilum measuring 9 mm, 8 mm, and 11 mm on images 130 through 137 of series 10. Heart size is normal. Minimal coronary artery calcification.  Small left pleural effusion, loculated posterior to the superior segment of the left lower lobe. Diffuse emphysematous changes. No acute osseous  abnormality. Small hiatal hernia. The visualized portion of the upper abdomen is otherwise normal.  Review of the MIP images confirms the above findings.  IMPRESSION: 1. Extensive consolidative pneumonia in the left lower lobe with reactive left hilar adenopathy. 2. Small loculated left pleural effusion. 3. Atelectasis at the right lung base. 4. Emphysema.   Electronically Signed   By: Lorriane Shire M.D.   On: 07/21/2014 14:41    EKG: Independently reviewed, normal sinus rhythm at 93, no ST-T changes  Assessment/Plan  Principal Problem:   Left lower lobe pneumonia Admit to MedSurg Continue empiric IV Rocephin and azithromycin. Follow blood culture, sputum culture, urine for strep antigen and Legionella antigen. Continue O2 via nasal cannula. When necessary IV Toradol for pleurisy. Supportive care with Tylenol and antitussives. Besides leukocytosis patient does not meet any other criteria for surgery/sepsis. Has minimal loculted parapneumonic effusion on CT. We'll monitor closely.     Active Problems: Acute kidney injury Likely secondary to dehydration. Monitor with IV fluids.     Dyslipidemia Resume home medication    Essential hypertension stable. Continue HCTZ     Depression Stable. Continue Celexa.       Diet:regular  DVT prophylaxis: sq lovenox   Code Status: full code Family Communication: discussed with patient and his wife at bedside Disposition Plan: Admit to inpatient. Home once clinically improved. Possibly in the next 48-72 hours  Ayline Dingus [facility] Pager [phone]  Total time spent on admission :70 minutes  If 7PM-7AM, please contact night-coverage [email or web address] Password [facility] 07/21/2014, 3:42 PM

## 2014-07-21 NOTE — ED Notes (Addendum)
Pt c/o fever, L sided flank pain x 5 days. Pt sts "nothing brings my fever down." Pt's temperature in triage is 98.4. When informed of temperature pt sts "I feel like it'll be going up any second now."  Pt also c/o night sweats after taking Tylenol at night. Pt sts "I suspect I lose a pint of fluid a night at least." Pt denies urinary symptoms. Pt denies SOB, unless taking a deep breath due to pain in L side. Pt A&Ox4 and ambulatory. Pt denies N/V/D.

## 2014-07-21 NOTE — ED Provider Notes (Signed)
CSN: [id number]     Arrival date & time 07/21/14  1051 History   First MD Initiated Contact with Patient 07/21/14 1147     Chief Complaint  Patient presents with  . Fever  . Flank Pain     (Consider location/radiation/quality/duration/timing/severity/associated sxs/prior Treatment) HPI Pt is a 64yo male with hx of HTN, Hyperlipidemia, and GERD, presenting to ED with c/o 1 week long, gradually worsening hx of fever, chills, diaphoresis, myalgias and arthralgias, with Left flank pain. Tmax 103. States it improves with acetaminophen but returns at least every 4 hours, and sometimes sooner than he can take his next dose of acetaminophen.  States last night he had to change his pajamas twice due to profuse sweating. States "i suspect i lost a pint of fluid last night."  Pt reports mild intermittent dry cough. Left flank pain is sharp and stabbing, 7/10, worse with deep inspiration. States he feels SOB at times as he cannot take regular breaths due to pain. Denies injury to Left side. Denies urinary frequency, urgency or hematuria. Denies hx of kidney stones. Denies recent travel or sick contacts. Denies hx of PE or DVT. Denies leg pain or swelling.    Past Medical History  Diagnosis Date  . CERVICAL RADICULOPATHY, RIGHT 08/12/2009  . DIVERTICULOSIS, COLON 08/15/2006  . GERD 08/15/2006  . HYPERLIPIDEMIA 08/15/2006  . HYPERTENSION 08/15/2006  . INTERNAL HEMORRHOIDS 02/29/2008  . SKIN CANCER, HX OF 08/15/2006   Past Surgical History  Procedure Laterality Date  . Tonsillectomy     Family History  Problem Relation Age of Onset  . Osteoporosis Mother   . COPD Father    History  Substance Use Topics  . Smoking status: Former Smoker -- 1.00 packs/day    Types: Cigarettes    Quit date: 08/29/2012  . Smokeless tobacco: Never Used  . Alcohol Use: Yes    Review of Systems  Constitutional: Positive for fever ( Tmax 103), chills, diaphoresis, appetite change and fatigue.  HENT: Negative for  congestion, ear pain and sore throat.   Respiratory: Positive for cough and shortness of breath.   Cardiovascular: Positive for chest pain (Left side). Negative for palpitations and leg swelling.  Gastrointestinal: Positive for nausea. Negative for vomiting, abdominal pain, diarrhea and constipation.  Genitourinary: Positive for flank pain (Left flank). Negative for dysuria, frequency and hematuria.  Musculoskeletal: Positive for myalgias and arthralgias. Negative for back pain and joint swelling.  Skin: Negative for rash.  Neurological: Positive for weakness ( generalized). Negative for dizziness, light-headedness and headaches.  All other systems reviewed and are negative.     Allergies  Bee venom  Home Medications   Prior to Admission medications   Medication Sig Start Date End Date Taking? Authorizing Provider  acetaminophen (TYLENOL) 500 MG tablet Take 1,000 mg by mouth every 6 (six) hours as needed for mild pain, moderate pain, fever or headache.   Yes Historical Provider, MD  citalopram (CELEXA) 40 MG tablet Take 1 tablet (40 mg total) by mouth daily. 02/19/14  Yes Marletta Lor, MD  EPINEPHrine (EPI-PEN) 0.3 mg/0.3 mL DEVI Inject 0.3 mLs (0.3 mg total) into the muscle once. 10/29/11  Yes Marletta Lor, MD  hydrochlorothiazide (HYDRODIURIL) 25 MG tablet Take 1 tablet (25 mg total) by mouth daily. 02/19/14  Yes Marletta Lor, MD  ibuprofen (ADVIL,MOTRIN) 200 MG tablet Take 200 mg by mouth every 4 (four) hours as needed for fever, headache, moderate pain or cramping.   Yes Historical Provider, MD  sildenafil (VIAGRA) 100 MG tablet Take 1 tablet (100 mg total) by mouth daily as needed. As directed 02/19/14  Yes Marletta Lor, MD  rosuvastatin (CRESTOR) 10 MG tablet 1 tablet twice weekly Patient not taking: Reported on 07/21/2014 02/19/14 06/20/15  Marletta Lor, MD   BP 116/68 mmHg  Pulse 87  Temp(Src) 99.7 F (37.6 C) (Oral)  Resp 18  SpO2 97% Physical Exam   Constitutional: He appears well-developed and well-nourished.  Pt lying in exam bed, NAD  HENT:  Head: Normocephalic and atraumatic.  Eyes: Conjunctivae are normal. No scleral icterus.  Neck: Normal range of motion. Neck supple.  Cardiovascular: Normal rate, regular rhythm and normal heart sounds.   Pulmonary/Chest: Effort normal. No respiratory distress. He has decreased breath sounds in the left lower field. He has no wheezes. He has rhonchi in the left lower field. He has rales in the left lower field. He exhibits no tenderness.  No respiratory distress, able to speak in full sentences w/o difficulty. Lungs: decreased breath sounds with crackles in Left lower lung field. No wheeze.  Left sided chest wall tenderness.   Abdominal: Soft. Bowel sounds are normal. He exhibits no distension and no mass. There is no tenderness. There is no rebound and no guarding.  Musculoskeletal: Normal range of motion.  Neurological: He is alert.  Skin: Skin is warm and dry. He is not diaphoretic.  Nursing note and vitals reviewed.   ED Course  Procedures (including critical care time) Labs Review Labs Reviewed  URINALYSIS, ROUTINE W REFLEX MICROSCOPIC (NOT AT [facility]) - Abnormal; Notable for the following:    Color, Urine AMBER (*)    APPearance CLOUDY (*)    Specific Gravity, Urine 1.039 (*)    Hgb urine dipstick SMALL (*)    Bilirubin Urine SMALL (*)    Protein, ur 100 (*)    All other components within normal limits  CBC - Abnormal; Notable for the following:    WBC 28.9 (*)    RBC 3.77 (*)    Hemoglobin 12.5 (*)    HCT 36.0 (*)    All other components within normal limits  COMPREHENSIVE METABOLIC PANEL - Abnormal; Notable for the following:    Chloride 98 (*)    Glucose, Bld 116 (*)    BUN 22 (*)    Creatinine, Ser 1.33 (*)    Albumin 3.2 (*)    GFR calc non Af Amer 55 (*)    All other components within normal limits  D-DIMER, QUANTITATIVE (NOT AT [facility]) - Abnormal; Notable for the  following:    D-Dimer, Quant 1.99 (*)    All other components within normal limits  CK - Abnormal; Notable for the following:    Total CK 36 (*)    All other components within normal limits  URINE MICROSCOPIC-ADD ON - Abnormal; Notable for the following:    Squamous Epithelial / LPF FEW (*)    Bacteria, UA FEW (*)    Casts HYALINE CASTS (*)    All other components within normal limits  CULTURE, BLOOD (ROUTINE X 2)  CULTURE, BLOOD (ROUTINE X 2)  LACTIC ACID, PLASMA  I-STAT TROPOININ, ED    Imaging Review Dg Chest 2 View  07/21/2014   CLINICAL DATA:  Fever, night sweats, LEFT flank pain for 5 days, LEFT side pain with deep breathing, history hypertension, former smoker  EXAM: CHEST  2 VIEW  COMPARISON:  None  FINDINGS: Upper normal heart size.  Mediastinal contours and pulmonary  vascularity normal.  Subsegmental atelectasis RIGHT base.  LEFT lower lobe consolidation consistent with pneumonia.  Upper lungs clear.  Underlying emphysematous and bronchitic changes.  No pleural effusion or pneumothorax.  Bones demineralized.  IMPRESSION: Question COPD changes with mi subsegmental atelectasis RIGHT base.  LEFT lower lobe consolidation consistent with pneumonia.  Followup PA and lateral chest X-ray is recommended in 3-4 weeks following trial of antibiotic therapy to ensure resolution and exclude underlying malignancy.   Electronically Signed   By: Lavonia Dana M.D.   On: 07/21/2014 13:28   Ct Angio Chest Pe W/cm &/or Wo Cm  07/21/2014   CLINICAL DATA:  Fever and left-sided flank pain. Left lower lobe pneumonia.  EXAM: CT ANGIOGRAPHY CHEST WITH CONTRAST  TECHNIQUE: Multidetector CT imaging of the chest was performed using the standard protocol during bolus administration of intravenous contrast. Multiplanar CT image reconstructions and MIPs were obtained to evaluate the vascular anatomy.  CONTRAST:  64m OMNIPAQUE IOHEXOL 350 MG/ML SOLN  COMPARISON:  Chest x-ray dated 07/21/2014  FINDINGS: There are no  pulmonary emboli. There is a 7 x 6 by 6 cm area of consolidation in the left lower lobe with adjacent areas of less well-defined consolidation. There is atelectasis at the right lung base. There are several slightly enlarged lymph nodes in the left hilum measuring 9 mm, 8 mm, and 11 mm on images 130 through 137 of series 10. Heart size is normal. Minimal coronary artery calcification.  Small left pleural effusion, loculated posterior to the superior segment of the left lower lobe. Diffuse emphysematous changes. No acute osseous abnormality. Small hiatal hernia. The visualized portion of the upper abdomen is otherwise normal.  Review of the MIP images confirms the above findings.  IMPRESSION: 1. Extensive consolidative pneumonia in the left lower lobe with reactive left hilar adenopathy. 2. Small loculated left pleural effusion. 3. Atelectasis at the right lung base. 4. Emphysema.   Electronically Signed   By: JLorriane ShireM.D.   On: 07/21/2014 14:41     EKG Interpretation   Date/Time:  Sunday July 21 2014 13:56:04 EDT Ventricular Rate:  93 PR Interval:  148 QRS Duration: 85 QT Interval:  370 QTC Calculation: 460 R Axis:   32 Text Interpretation:  Sinus rhythm Low voltage, precordial leads Baseline  wander in lead(s) II III aVF No old tracing to compare Confirmed by KNAPP   MD-J, JON (([id number] on 07/21/2014 2:02:03 PM      MDM   Final diagnoses:  CAP (community acquired pneumonia)    Pt is a 89yomale presenting to ED with c/o Left flank pain, myalgias, arthralgias, night sweats, Temp of 103 max at home for 1 week. Vitals in ED: WNL, pt is afebrile.   CBC: leukocytosis with WBC-28.9 CMP: mildly elevated Cr 1.33  Pt is low risk for PE, however, with sharp chest pain with inspiration and no hx of kidney stones or urinary symptoms, D-dimer ordered.  Pt also c/o severe body aches. Pt has been on Crestor, CK ordered to r/o rhabdomyolysis.  CK: unremarkable D-dimer: positive at 1.99  1:29  PM CXR official read still pending.  This provider reviewed labs and imaging, CXR concerning for Left Lower Lobe pneumonia. Will start CAP antibiotics given CXR and WBC of 28.9    CXR: confirms suspected Left lower lobe pneumonia.  CT angio chest: extensive consolidative pneumonia in Left lower lobe with reactive left hilar adenopathy. Small loculated left pleural effusion.  No Pulmonary emboli.    Discussed pt  with Dr. Dorie Rank, will consult to admit pt.    Consulted with Dr. Clementeen Graham, [facility], agreed to have pt admitted to med-surg bed. Temporary admission orders placed.   Noland Fordyce, PA-C 07/21/14 1548  Dorie Rank, MD 07/22/14 [phone]

## 2014-07-22 DIAGNOSIS — D649 Anemia, unspecified: Secondary | ICD-10-CM

## 2014-07-22 DIAGNOSIS — E876 Hypokalemia: Secondary | ICD-10-CM

## 2014-07-22 LAB — CBC
HEMATOCRIT: 27 % — AB (ref 39.0–52.0)
Hemoglobin: 9.2 g/dL — ABNORMAL LOW (ref 13.0–17.0)
MCH: 32.2 pg (ref 26.0–34.0)
MCHC: 34.1 g/dL (ref 30.0–36.0)
MCV: 94.4 fL (ref 78.0–100.0)
PLATELETS: 316 10*3/uL (ref 150–400)
RBC: 2.86 MIL/uL — ABNORMAL LOW (ref 4.22–5.81)
RDW: 13 % (ref 11.5–15.5)
WBC: 20.5 10*3/uL — ABNORMAL HIGH (ref 4.0–10.5)

## 2014-07-22 LAB — BASIC METABOLIC PANEL
Anion gap: 10 (ref 5–15)
BUN: 15 mg/dL (ref 6–20)
CHLORIDE: 108 mmol/L (ref 101–111)
CO2: 21 mmol/L — ABNORMAL LOW (ref 22–32)
CREATININE: 1.07 mg/dL (ref 0.61–1.24)
Calcium: 8 mg/dL — ABNORMAL LOW (ref 8.9–10.3)
GFR calc Af Amer: >60 mL/min (ref >60)
Glucose, Bld: 131 mg/dL — ABNORMAL HIGH (ref 65–99)
Potassium: 3.3 mmol/L — ABNORMAL LOW (ref 3.5–5.1)
Sodium: 139 mmol/L (ref 135–145)

## 2014-07-22 LAB — STREP PNEUMONIAE URINARY ANTIGEN: Strep Pneumo Urinary Antigen: NEGATIVE

## 2014-07-22 LAB — EXPECTORATED SPUTUM ASSESSMENT W REFEX TO RESP CULTURE: Special Requests: NORMAL

## 2014-07-22 LAB — IRON AND TIBC
IRON: 11 ug/dL — AB (ref 45–182)
Saturation Ratios: 7 % — ABNORMAL LOW (ref 17.9–39.5)
TIBC: 164 ug/dL — ABNORMAL LOW (ref 250–450)
UIBC: 153 ug/dL

## 2014-07-22 LAB — EXPECTORATED SPUTUM ASSESSMENT W GRAM STAIN, RFLX TO RESP C

## 2014-07-22 MED ORDER — POTASSIUM CHLORIDE CRYS ER 20 MEQ PO TBCR
40.0000 meq | EXTENDED_RELEASE_TABLET | Freq: Once | ORAL | Status: AC
  Administered 2014-07-22: 40 meq via ORAL
  Filled 2014-07-22: qty 2

## 2014-07-22 MED ORDER — ACETAMINOPHEN 325 MG PO TABS
650.0000 mg | ORAL_TABLET | Freq: Four times a day (QID) | ORAL | Status: DC | PRN
  Filled 2014-07-22 (×2): qty 2

## 2014-07-22 MED ORDER — HYDROCODONE-ACETAMINOPHEN 5-325 MG PO TABS
2.0000 | ORAL_TABLET | ORAL | Status: DC | PRN
  Administered 2014-07-22 (×2): 1 via ORAL
  Administered 2014-07-23 – 2014-07-26 (×16): 2 via ORAL
  Filled 2014-07-22 (×17): qty 2

## 2014-07-22 MED ORDER — CITALOPRAM HYDROBROMIDE 20 MG PO TABS
20.0000 mg | ORAL_TABLET | Freq: Every day | ORAL | Status: DC
  Administered 2014-07-22 – 2014-07-31 (×10): 20 mg via ORAL
  Filled 2014-07-22 (×13): qty 1

## 2014-07-22 MED ORDER — HYDROCODONE-ACETAMINOPHEN 5-325 MG PO TABS
2.0000 | ORAL_TABLET | Freq: Four times a day (QID) | ORAL | Status: DC | PRN
  Administered 2014-07-22: 1 via ORAL
  Filled 2014-07-22: qty 2

## 2014-07-22 NOTE — Progress Notes (Signed)
Date:  July 22, 2014 U.R. performed for needs and level of care. Will continue to follow for Case Management needs.  Velva Harman, RN, BSN, [place]   [phone]

## 2014-07-22 NOTE — Progress Notes (Signed)
[facility] PROGRESS NOTE  Shane Vasquez ZTI:[id number] DOB: [birth year] DOA: 07/21/2014 PCP: [name], MD  Assessment/Plan: Principal Problem:  Left lower lobe pneumonia -empiric IV Rocephin and azithromycin. Follow blood culture, sputum culture, urine for strep antigen and Legionella antigen. Continue O2 via nasal cannula. When necessary IV Toradol for pleurisy.  Add vicodin prn as symptoms persist. Supportive care with Tylenol and antitussives. Wbc improving. Noted small left loculated effusion on CT . Needs follow up CXR in 4-6 weeks to look for resolution.     Active Problems: Acute kidney injury Likely secondary to dehydration. Improved with fluids    Dyslipidemia Cont  home medication   Essential hypertension stable. Continue HCTZ   Hypokalemia  replenished   Depression Stable. Continue Celexa.   Anemia Baseline hb normal. check iron panel and stool for occult blood.     Diet:regular  DVT prophylaxis: sq lovenox   Code Status: full code Family Communication: none at bedside Disposition Plan:  Home Possibly in the next 48 hours   Consultants:  none  Procedures:  Ct ANGIO CHEST  Antibiotics:  IV rocephin and azithro 6/5--  HPI/Subjective: Still has left pleurisy. tmax of 100.9 overngiht  Objective: Filed Vitals:   07/22/14 0555  BP: 104/63  Pulse: 79  Temp: 99.5 F (37.5 C)  Resp: 20    Intake/Output Summary (Last 24 hours) at 07/22/14 1253 Last data filed at 07/22/14 0900  Gross per 24 hour  Intake    720 ml  Output    600 ml  Net    120 ml   Filed Weights   07/21/14 1700  Weight: 74.3 kg (163 lb 12.8 oz)    Exam:   General: NAD, fatigued   HEENT: no pallor, moist mucosa, supple neck   chest: coarse crackles over left lung with diminished breath sounds  CVS: N S1&S2, no murmurs  Abd: soft, NT ND BS+  Musculoskeletal: warm, no edema    Data Reviewed: Basic Metabolic Panel:  Recent  Labs Lab 07/21/14 1134 07/22/14 0442  NA 135 139  K 3.7 3.3*  CL 98* 108  CO2 26 21*  GLUCOSE 116* 131*  BUN 22* 15  CREATININE 1.33* 1.07  CALCIUM 9.1 8.0*   Liver Function Tests:  Recent Labs Lab 07/21/14 1134  AST 23  ALT 25  ALKPHOS 89  BILITOT 0.3  PROT 8.0  ALBUMIN 3.2*   No results for input(s): LIPASE, AMYLASE in the last 168 hours. No results for input(s): AMMONIA in the last 168 hours. CBC:  Recent Labs Lab 07/21/14 1134 07/22/14 0442  WBC 28.9* 20.5*  HGB 12.5* 9.2*  HCT 36.0* 27.0*  MCV 95.5 94.4  PLT 391 316   Cardiac Enzymes:  Recent Labs Lab 07/21/14 1217  CKTOTAL 36*   BNP (last 3 results) No results for input(s): BNP in the last 8760 hours.  ProBNP (last 3 results) No results for input(s): PROBNP in the last 8760 hours.  CBG: No results for input(s): GLUCAP in the last 168 hours.  Recent Results (from the past 240 hour(s))  Culture, blood (routine x 2)     Status: None (Preliminary result)   Collection Time: 07/21/14  4:14 PM  Result Value Ref Range Status   Specimen Description BLOOD LEFT WRIST  Final   Special Requests BOTTLES DRAWN AEROBIC AND ANAEROBIC 5CC  Final   Culture   Final           BLOOD CULTURE RECEIVED NO GROWTH TO DATE CULTURE  WILL BE HELD FOR 5 DAYS BEFORE ISSUING A FINAL NEGATIVE REPORT Performed at [facility]    Report Status PENDING  Incomplete  Culture, blood (routine x 2)     Status: None (Preliminary result)   Collection Time: 07/21/14  4:14 PM  Result Value Ref Range Status   Specimen Description BLOOD RIGHT HAND  Final   Special Requests BOTTLES DRAWN AEROBIC AND ANAEROBIC 5CC  Final   Culture   Final           BLOOD CULTURE RECEIVED NO GROWTH TO DATE CULTURE WILL BE HELD FOR 5 DAYS BEFORE ISSUING A FINAL NEGATIVE REPORT Performed at [facility]    Report Status PENDING  Incomplete  Culture, sputum-assessment     Status: None   Collection Time: 07/22/14  3:05 AM  Result Value Ref  Range Status   Specimen Description SPUTUM  Final   Special Requests Normal  Final   Sputum evaluation   Final    THIS SPECIMEN IS ACCEPTABLE. RESPIRATORY CULTURE REPORT TO FOLLOW.   Report Status 07/22/2014 FINAL  Final     Studies: Dg Chest 2 View  07/21/2014   CLINICAL DATA:  Fever, night sweats, LEFT flank pain for 5 days, LEFT side pain with deep breathing, history hypertension, former smoker  EXAM: CHEST  2 VIEW  COMPARISON:  None  FINDINGS: Upper normal heart size.  Mediastinal contours and pulmonary vascularity normal.  Subsegmental atelectasis RIGHT base.  LEFT lower lobe consolidation consistent with pneumonia.  Upper lungs clear.  Underlying emphysematous and bronchitic changes.  No pleural effusion or pneumothorax.  Bones demineralized.  IMPRESSION: Question COPD changes with mi subsegmental atelectasis RIGHT base.  LEFT lower lobe consolidation consistent with pneumonia.  Followup PA and lateral chest X-ray is recommended in 3-4 weeks following trial of antibiotic therapy to ensure resolution and exclude underlying malignancy.   Electronically Signed   By: Lavonia Dana M.D.   On: 07/21/2014 13:28   Ct Angio Chest Pe W/cm &/or Wo Cm  07/21/2014   CLINICAL DATA:  Fever and left-sided flank pain. Left lower lobe pneumonia.  EXAM: CT ANGIOGRAPHY CHEST WITH CONTRAST  TECHNIQUE: Multidetector CT imaging of the chest was performed using the standard protocol during bolus administration of intravenous contrast. Multiplanar CT image reconstructions and MIPs were obtained to evaluate the vascular anatomy.  CONTRAST:  41m OMNIPAQUE IOHEXOL 350 MG/ML SOLN  COMPARISON:  Chest x-ray dated 07/21/2014  FINDINGS: There are no pulmonary emboli. There is a 7 x 6 by 6 cm area of consolidation in the left lower lobe with adjacent areas of less well-defined consolidation. There is atelectasis at the right lung base. There are several slightly enlarged lymph nodes in the left hilum measuring 9 mm, 8 mm, and 11 mm  on images 130 through 137 of series 10. Heart size is normal. Minimal coronary artery calcification.  Small left pleural effusion, loculated posterior to the superior segment of the left lower lobe. Diffuse emphysematous changes. No acute osseous abnormality. Small hiatal hernia. The visualized portion of the upper abdomen is otherwise normal.  Review of the MIP images confirms the above findings.  IMPRESSION: 1. Extensive consolidative pneumonia in the left lower lobe with reactive left hilar adenopathy. 2. Small loculated left pleural effusion. 3. Atelectasis at the right lung base. 4. Emphysema.   Electronically Signed   By: JLorriane ShireM.D.   On: 07/21/2014 14:41    Scheduled Meds: . azithromycin  500 mg Oral Q24H  .  cefTRIAXone (ROCEPHIN)  IV  1 g Intravenous Q24H  . citalopram  20 mg Oral QHS  . enoxaparin (LOVENOX) injection  40 mg Subcutaneous Q24H  . hydrochlorothiazide  25 mg Oral Daily   Continuous Infusions: . sodium chloride 50 mL/hr at 07/22/14 1138     Time spent:25 minutes    Topanga Alvelo, [place]  [facility] Pager [phone]. If 7PM-7AM, please contact night-coverage at [email or web address], password [facility] 07/22/2014, 12:53 PM  LOS: 1 day

## 2014-07-22 NOTE — Care Management Note (Signed)
Case Management Note  Patient Details  Name: Shane Vasquez MRN: [id number] Date of Birth: 27-Jan-1951  Subjective/Objective:             pna       Action/Plan:home when stable   Expected Discharge Date:         [id number]         Expected Discharge Plan:  Home/Self Care  In-House Referral:  NA  Discharge planning Services  CM Consult  Post Acute Care Choice:  NA Choice offered to:  NA  DME Arranged:  N/A DME Agency:  NA  HH Arranged:  NA HH Agency:  NA  Status of Service:  In process, will continue to follow  Medicare Important Message Given:    Date Medicare IM Given:    Medicare IM give by:    Date Additional Medicare IM Given:    Additional Medicare Important Message give by:     If discussed at [place] of Stay Meetings, dates discussed:    Additional Comments:  Leeroy Cha, RN 07/22/2014, 12:53 PM

## 2014-07-23 ENCOUNTER — Inpatient Hospital Stay (HOSPITAL_COMMUNITY): Payer: Medicare Other

## 2014-07-23 DIAGNOSIS — R091 Pleurisy: Secondary | ICD-10-CM

## 2014-07-23 DIAGNOSIS — A419 Sepsis, unspecified organism: Principal | ICD-10-CM

## 2014-07-23 LAB — CBC
HEMATOCRIT: 27.6 % — AB (ref 39.0–52.0)
HEMOGLOBIN: 9.2 g/dL — AB (ref 13.0–17.0)
MCH: 32.1 pg (ref 26.0–34.0)
MCHC: 33.3 g/dL (ref 30.0–36.0)
MCV: 96.2 fL (ref 78.0–100.0)
PLATELETS: 347 10*3/uL (ref 150–400)
RBC: 2.87 MIL/uL — ABNORMAL LOW (ref 4.22–5.81)
RDW: 13.5 % (ref 11.5–15.5)
WBC: 17.3 10*3/uL — ABNORMAL HIGH (ref 4.0–10.5)

## 2014-07-23 LAB — HIV ANTIBODY (ROUTINE TESTING W REFLEX): HIV Screen 4th Generation wRfx: NONREACTIVE

## 2014-07-23 LAB — LEGIONELLA ANTIGEN, URINE

## 2014-07-23 LAB — LACTIC ACID, PLASMA: LACTIC ACID, VENOUS: 0.9 mmol/L (ref 0.5–2.0)

## 2014-07-23 MED ORDER — PANTOPRAZOLE SODIUM 40 MG PO TBEC
40.0000 mg | DELAYED_RELEASE_TABLET | Freq: Every day | ORAL | Status: DC
  Administered 2014-07-23 – 2014-07-26 (×4): 40 mg via ORAL
  Filled 2014-07-23 (×4): qty 1

## 2014-07-23 MED ORDER — SODIUM CHLORIDE 0.9 % IV SOLN: INTRAVENOUS | Status: DC

## 2014-07-23 MED ORDER — MORPHINE SULFATE 2 MG/ML IJ SOLN
2.0000 mg | Freq: Once | INTRAMUSCULAR | Status: DC
  Filled 2014-07-23: qty 1

## 2014-07-23 MED ORDER — SODIUM CHLORIDE 0.9 % IV BOLUS (SEPSIS)
2000.0000 mL | Freq: Once | INTRAVENOUS | Status: AC
  Administered 2014-07-23: 2000 mL via INTRAVENOUS

## 2014-07-23 MED ORDER — FUROSEMIDE 10 MG/ML IJ SOLN
40.0000 mg | Freq: Once | INTRAMUSCULAR | Status: AC
  Administered 2014-07-23: 40 mg via INTRAVENOUS
  Filled 2014-07-23: qty 4

## 2014-07-23 MED ORDER — IBUPROFEN 400 MG PO TABS
400.0000 mg | ORAL_TABLET | Freq: Once | ORAL | Status: AC
  Administered 2014-07-23: 400 mg via ORAL
  Filled 2014-07-23: qty 1

## 2014-07-23 MED ORDER — SODIUM CHLORIDE 0.9 % IV BOLUS (SEPSIS)
500.0000 mL | Freq: Once | INTRAVENOUS | Status: AC
Start: 2014-07-23 — End: 2014-07-23
  Administered 2014-07-23: 500 mL via INTRAVENOUS

## 2014-07-23 NOTE — Progress Notes (Addendum)
[facility] PROGRESS NOTE  KEDDRICK WYNE GMW:[id number] DOB: Dec 02, 1950 DOA: 07/21/2014 PCP: [name], MD  Brief narrative 64 year old male with history of hypertension, hyperlipidemia and GERD presented to the ED with 4 day history of fever with chills and sweating. Also had nonproductive cough and left lateral chest pain with deep inspiration and coughing. MAXIMUM TEMPERATURE at home was 102.24F. In the ED patient had significant leukocytosis with WBC of 29K and was afebrile. A chest x-ray and CT angiogram done showed extensive left lower lobe pneumonia with reactive left hilar adenopathy and a small loculated left pleural effusion. Patient admitted for community-acquired pneumonia.  Assessment/Plan: Principal Problem: Sepsis due to  Left lower lobe pneumonia -meets sepsis criteria today with elevated wbc fever and tachycardia. Will order 2 L NS bolus . Check lactic acid. -empiric IV Rocephin and azithromycin. Follow blood culture so far negative, respiratory culture shows few GNR, urine for strep antigen and Legionella negative. -Continue O2 via nasal cannula. When necessary Vicodin for pleurisy.   Supportive care with Tylenol and antitussives. Wbc improving but continues to be febrile and has persistent pleurisy. Will repeat PA and lateral chest x-ray.   Active Problems: Acute kidney injury Likely secondary to dehydration. Improved with fluids . Resume HCTZ    Dyslipidemia Cont home medication   Essential hypertension stable. Continue HCTZ   Hypokalemia replenished   Depression Stable. Continue Celexa.   Anemia Baseline hb normal. Iron panel suggests iron deficiency. Check stool for occult blood.    Diet:regular  DVT prophylaxis: sq lovenox   Code Status: full code Family Communication: Wife updated on the phone Disposition Plan: Home Possibly in the next 24-48 if clinically improved   Consultants:  none  Procedures:  CT  Angio chest   Antibiotics:  IV rocephin and azithro 6/5--  HPI/Subjective: Still continues to have left-sided chest pain. Febrile to 103F overnight  Objective: Filed Vitals:   07/23/14 1047  BP: 141/74  Pulse: 70  Temp: 98.4 F (36.9 C)  Resp:     Intake/Output Summary (Last 24 hours) at 07/23/14 1306 Last data filed at 07/23/14 1225  Gross per 24 hour  Intake 3551.66 ml  Output    575 ml  Net 2976.66 ml   Filed Weights   07/21/14 1700  Weight: 74.3 kg (163 lb 12.8 oz)    Exam:   General:  Elderly male in no acute distress  HEENT: No pallor, moist oral mucosa, supple neck  Chest: Left basilar crackles, no rhonchi or wheeze  Cardiovascular: Normal S1 and S2, no murmurs rub or gallop  GI: Soft, nondistended, nontender, bowel sounds present  Musculoskeletal: Warm, no edema   CNS: Alert and oriented   Data Reviewed: Basic Metabolic Panel:  Recent Labs Lab 07/21/14 1134 07/22/14 0442  NA 135 139  K 3.7 3.3*  CL 98* 108  CO2 26 21*  GLUCOSE 116* 131*  BUN 22* 15  CREATININE 1.33* 1.07  CALCIUM 9.1 8.0*   Liver Function Tests:  Recent Labs Lab 07/21/14 1134  AST 23  ALT 25  ALKPHOS 89  BILITOT 0.3  PROT 8.0  ALBUMIN 3.2*   No results for input(s): LIPASE, AMYLASE in the last 168 hours. No results for input(s): AMMONIA in the last 168 hours. CBC:  Recent Labs Lab 07/21/14 1134 07/22/14 0442 07/23/14 0415  WBC 28.9* 20.5* 17.3*  HGB 12.5* 9.2* 9.2*  HCT 36.0* 27.0* 27.6*  MCV 95.5 94.4 96.2  PLT 391 316 347   Cardiac Enzymes:  Recent Labs Lab 07/21/14 1217  CKTOTAL 36*   BNP (last 3 results) No results for input(s): BNP in the last 8760 hours.  ProBNP (last 3 results) No results for input(s): PROBNP in the last 8760 hours.  CBG: No results for input(s): GLUCAP in the last 168 hours.  Recent Results (from the past 240 hour(s))  Culture, blood (routine x 2)     Status: None (Preliminary result)   Collection Time:  07/21/14  4:14 PM  Result Value Ref Range Status   Specimen Description BLOOD LEFT WRIST  Final   Special Requests BOTTLES DRAWN AEROBIC AND ANAEROBIC 5CC  Final   Culture   Final           BLOOD CULTURE RECEIVED NO GROWTH TO DATE CULTURE WILL BE HELD FOR 5 DAYS BEFORE ISSUING A FINAL NEGATIVE REPORT Performed at [facility]    Report Status PENDING  Incomplete  Culture, blood (routine x 2)     Status: None (Preliminary result)   Collection Time: 07/21/14  4:14 PM  Result Value Ref Range Status   Specimen Description BLOOD RIGHT HAND  Final   Special Requests BOTTLES DRAWN AEROBIC AND ANAEROBIC 5CC  Final   Culture   Final           BLOOD CULTURE RECEIVED NO GROWTH TO DATE CULTURE WILL BE HELD FOR 5 DAYS BEFORE ISSUING A FINAL NEGATIVE REPORT Performed at [facility]    Report Status PENDING  Incomplete  Culture, sputum-assessment     Status: None   Collection Time: 07/22/14  3:05 AM  Result Value Ref Range Status   Specimen Description SPUTUM  Final   Special Requests Normal  Final   Sputum evaluation   Final    THIS SPECIMEN IS ACCEPTABLE. RESPIRATORY CULTURE REPORT TO FOLLOW.   Report Status 07/22/2014 FINAL  Final  Culture, respiratory (NON-Expectorated)     Status: None (Preliminary result)   Collection Time: 07/22/14  3:05 AM  Result Value Ref Range Status   Specimen Description SPUTUM  Final   Special Requests NONE  Final   Gram Stain   Final    MODERATE WBC PRESENT,BOTH PMN AND MONONUCLEAR RARE SQUAMOUS EPITHELIAL CELLS PRESENT FEW GRAM NEGATIVE RODS Performed at [facility]    Culture   Final    Culture reincubated for better growth Performed at [facility]    Report Status PENDING  Incomplete     Studies: Ct Angio Chest Pe W/cm &/or Wo Cm  07/21/2014   CLINICAL DATA:  Fever and left-sided flank pain. Left lower lobe pneumonia.  EXAM: CT ANGIOGRAPHY CHEST WITH CONTRAST  TECHNIQUE: Multidetector CT imaging of the chest was  performed using the standard protocol during bolus administration of intravenous contrast. Multiplanar CT image reconstructions and MIPs were obtained to evaluate the vascular anatomy.  CONTRAST:  59m OMNIPAQUE IOHEXOL 350 MG/ML SOLN  COMPARISON:  Chest x-ray dated 07/21/2014  FINDINGS: There are no pulmonary emboli. There is a 7 x 6 by 6 cm area of consolidation in the left lower lobe with adjacent areas of less well-defined consolidation. There is atelectasis at the right lung base. There are several slightly enlarged lymph nodes in the left hilum measuring 9 mm, 8 mm, and 11 mm on images 130 through 137 of series 10. Heart size is normal. Minimal coronary artery calcification.  Small left pleural effusion, loculated posterior to the superior segment of the left lower lobe. Diffuse emphysematous changes. No acute osseous  abnormality. Small hiatal hernia. The visualized portion of the upper abdomen is otherwise normal.  Review of the MIP images confirms the above findings.  IMPRESSION: 1. Extensive consolidative pneumonia in the left lower lobe with reactive left hilar adenopathy. 2. Small loculated left pleural effusion. 3. Atelectasis at the right lung base. 4. Emphysema.   Electronically Signed   By: Lorriane Shire M.D.   On: 07/21/2014 14:41    Scheduled Meds: . azithromycin  500 mg Oral Q24H  . cefTRIAXone (ROCEPHIN)  IV  1 g Intravenous Q24H  . citalopram  20 mg Oral QHS  . enoxaparin (LOVENOX) injection  40 mg Subcutaneous Q24H  . hydrochlorothiazide  25 mg Oral Daily   Continuous Infusions:    Time spent: 25 minutes    Ninoska Goswick, [place]  [facility] Pager [phone]. If 7PM-7AM, please contact night-coverage at [email or web address], password [facility] 07/23/2014, 1:06 PM  LOS: 2 days

## 2014-07-23 NOTE — Progress Notes (Signed)
RN following up on chest xray results. Xray impression interpreted by radiologist showed CHF. MD paged about chest xray resulting.   RN using nursing judgement stopped 2098m bolus, and called physician. Physician read xray, and informed RN to stop bolus, and give IV lasix.   RN completed these orders.

## 2014-07-24 LAB — BASIC METABOLIC PANEL
Anion gap: 9 (ref 5–15)
BUN: 17 mg/dL (ref 6–20)
CALCIUM: 8 mg/dL — AB (ref 8.9–10.3)
CO2: 25 mmol/L (ref 22–32)
Chloride: 102 mmol/L (ref 101–111)
Creatinine, Ser: 1.14 mg/dL (ref 0.61–1.24)
GFR calc Af Amer: >60 mL/min (ref >60)
Glucose, Bld: 106 mg/dL — ABNORMAL HIGH (ref 65–99)
Potassium: 3.4 mmol/L — ABNORMAL LOW (ref 3.5–5.1)
Sodium: 136 mmol/L (ref 135–145)

## 2014-07-24 LAB — CULTURE, RESPIRATORY: CULTURE: NORMAL

## 2014-07-24 LAB — CBC
HCT: 30.6 % — ABNORMAL LOW (ref 39.0–52.0)
HEMOGLOBIN: 10.4 g/dL — AB (ref 13.0–17.0)
MCH: 32.3 pg (ref 26.0–34.0)
MCHC: 34 g/dL (ref 30.0–36.0)
MCV: 95 fL (ref 78.0–100.0)
PLATELETS: 403 10*3/uL — AB (ref 150–400)
RBC: 3.22 MIL/uL — ABNORMAL LOW (ref 4.22–5.81)
RDW: 13.6 % (ref 11.5–15.5)
WBC: 19.5 10*3/uL — ABNORMAL HIGH (ref 4.0–10.5)

## 2014-07-24 LAB — CULTURE, RESPIRATORY W GRAM STAIN

## 2014-07-24 MED ORDER — IPRATROPIUM-ALBUTEROL 0.5-2.5 (3) MG/3ML IN SOLN: 3.0000 mL | RESPIRATORY_TRACT | Status: DC | PRN

## 2014-07-24 MED ORDER — GUAIFENESIN ER 600 MG PO TB12
600.0000 mg | ORAL_TABLET | Freq: Two times a day (BID) | ORAL | Status: DC
Start: 2014-07-24 — End: 2014-08-01
  Administered 2014-07-24 – 2014-08-01 (×15): 600 mg via ORAL
  Filled 2014-07-24 (×19): qty 1

## 2014-07-24 MED ORDER — GUAIFENESIN ER 600 MG PO TB12: 600.0000 mg | ORAL_TABLET | Freq: Two times a day (BID) | ORAL | Status: DC

## 2014-07-24 MED ORDER — FUROSEMIDE 10 MG/ML IJ SOLN
40.0000 mg | Freq: Once | INTRAMUSCULAR | Status: AC
  Administered 2014-07-24: 40 mg via INTRAVENOUS
  Filled 2014-07-24: qty 4

## 2014-07-24 MED ORDER — IPRATROPIUM-ALBUTEROL 0.5-2.5 (3) MG/3ML IN SOLN: 3.0000 mL | Freq: Three times a day (TID) | RESPIRATORY_TRACT | Status: DC

## 2014-07-24 MED ORDER — IPRATROPIUM-ALBUTEROL 0.5-2.5 (3) MG/3ML IN SOLN: 3.0000 mL | Freq: Two times a day (BID) | RESPIRATORY_TRACT | Status: DC

## 2014-07-24 MED ORDER — IPRATROPIUM-ALBUTEROL 0.5-2.5 (3) MG/3ML IN SOLN
3.0000 mL | Freq: Four times a day (QID) | RESPIRATORY_TRACT | Status: DC
  Administered 2014-07-24 (×3): 3 mL via RESPIRATORY_TRACT
  Filled 2014-07-24 (×2): qty 3

## 2014-07-24 MED ORDER — LEVOFLOXACIN 500 MG PO TABS: 500.0000 mg | ORAL_TABLET | Freq: Every day | ORAL | Status: DC

## 2014-07-24 MED ORDER — IPRATROPIUM-ALBUTEROL 0.5-2.5 (3) MG/3ML IN SOLN
3.0000 mL | Freq: Three times a day (TID) | RESPIRATORY_TRACT | Status: DC
  Filled 2014-07-24: qty 3

## 2014-07-24 MED ORDER — FUROSEMIDE 10 MG/ML IJ SOLN
INTRAMUSCULAR | Status: AC
  Filled 2014-07-24: qty 4

## 2014-07-24 MED ORDER — GUAIFENESIN 100 MG/5ML PO SYRP
200.0000 mg | ORAL_SOLUTION | ORAL | Status: DC | PRN
  Filled 2014-07-24: qty 10

## 2014-07-24 MED ORDER — HYDROCODONE-ACETAMINOPHEN 5-325 MG PO TABS: 2.0000 | ORAL_TABLET | ORAL | Status: DC | PRN

## 2014-07-24 NOTE — Progress Notes (Addendum)
Patient ID: Shane Vasquez, male   DOB: April 29, 1950, 64 y.o.   MRN: [id number]  [facility] PROGRESS NOTE  Shane Vasquez [id number]:[id number] DOB: 1950-08-06 DOA: 07/21/2014 PCP: [name], MD   Brief narrative:    64 year old male with history of hypertension, hyperlipidemia and GERD presented to the ED with 4 days history of fever with chills, sweating, a/w nonproductive cough and left lateral chest pain with deep inspiration and coughing. Tmax at home 102.76F.  In the ED patient had significant leukocytosis with WBC of 29 K, chest x-ray and CT angiogram done showed extensive left lower lobe pneumonia with reactive left hilar adenopathy and a small loculated left pleural effusion.  Assessment/Plan:    Sepsis due toLeft lower lobe pneumonia - meets sepsis criteria with elevated wbc, fever, and tachycardia, source lobar PNA but unknown pathogen at this time, likely CAP - continue empiric IV Rocephin and azithromycin - blood culture so far negative, respiratory culture shows few GNR, urine for strep antigen and Legionella negative. - Continue O2 via nasal cannula. When necessary Vicodin for pleurisy.  - repeat CXR 6/7 with interval development and pulmonary vascular congestion and worsening pneumonia - We'll discuss findings with pulmonary specialist  Active Problems:   Acute on chronic respiratory distress due to Acute diastolic CHF imposed on COPD Emphysema  - Secondary to IV fluids patient has received for the problem above, sepsis secondary to pneumonia - Lasix 40 mg IV 1 dose given 07/23/2014, we'll give additional dose of Lasix today - Weight on admission 163 lbs, monitor daily weights, strict I/O    Chronic respiratory disease, emphysema/COPD - will check oxygen with ambulation and at rest to evaluate oxygen needs - continue BD's scheduled and as needed     Acute kidney injury - Likely secondary to dehydration. Improved with fluids and Cr WNL 07/22/14 - repeat  BMP in AM   Dyslipidemia - Cont home medication   Essential hypertension - reasonable inpatient control  - Continue HCTZ    Hypokalemia - Secondary to Lasix - supplement and repeat BMP in AM    Depression - Stable. Continue Celexa.    Anemia of chronic disease IDA - no signs of active bleeding  - repeat CBC in AM  DVT prophylaxis - Lovenox SQ  Code Status: Full.  Family Communication:  plan of care discussed with the patient Disposition Plan: Home 07/25/2014  IV access:  Peripheral IV  Procedures and diagnostic studies:    Dg Chest 2 View 07/23/2014  Interval development of CHF and/or fluid overload since the examinations 2 days ago, with stable mild cardiomegaly and moderate diffuse interstitial pulmonary edema. 2. Worsening pneumonia involving the left lower lobe. 3. Bilateral pleural effusions, moderate-sized on the left and small on the right.     Dg Chest 2 View 07/21/2014   Question COPD changes with mi subsegmental atelectasis RIGHT base.  LEFT lower lobe consolidation consistent with pneumonia.  Followup PA and lateral chest X-ray is recommended in 3-4 weeks following trial of antibiotic therapy to ensure resolution and exclude underlying malignancy.    Ct Angio Chest Pe W/cm &/or Wo Cm 07/21/2014    Extensive consolidative pneumonia in the left lower lobe with reactive left hilar adenopathy. 2. Small loculated left pleural effusion. 3. Atelectasis at the right lung base. 4. Emphysema.    Medical Consultants:  None  Other Consultants:  None  IAnti-Infectives:   Zithromax 07/21/2014 --> Rocephin 07/21/2014 -->  Faye Ramsay, MD  [facility] Pager [phone]  If  7PM-7AM, please contact night-coverage [email or web address] Password [facility] 07/24/2014, 11:47 AM   LOS: 3 days   HPI/Subjective: No events overnight.   Objective: Filed Vitals:   07/23/14 1447 07/23/14 1811 07/23/14 2117 07/24/14 0535  BP: 143/74 131/77 104/64 130/76  Pulse: 71 89 88 84  Temp: 98.4 F  (36.9 C) 99.5 F (37.5 C) 99 F (37.2 C) 98.7 F (37.1 C)  TempSrc: Oral Oral Oral Oral  Resp: '18 20 20 20  '$ Height:      Weight:      SpO2: 96% 93% 95% 93%    Intake/Output Summary (Last 24 hours) at 07/24/14 1147 Last data filed at 07/23/14 2027  Gross per 24 hour  Intake 897.16 ml  Output   1300 ml  Net -402.84 ml    Exam:   General:  Pt is alert, follows commands appropriately, not in acute distress  Cardiovascular: Regular rate and rhythm, S1/S2, no murmurs, no rubs, no gallops  Respiratory: Rhonchi as on the left side, bibasilar crackles, no wheezing  Abdomen: Soft, non tender, non distended, bowel sounds present, no guarding  Extremities:  pulses DP and PT palpable bilaterally  Neuro: Grossly nonfocal  Data Reviewed: Basic Metabolic Panel:  Recent Labs Lab 07/21/14 1134 07/22/14 0442  NA 135 139  K 3.7 3.3*  CL 98* 108  CO2 26 21*  GLUCOSE 116* 131*  BUN 22* 15  CREATININE 1.33* 1.07  CALCIUM 9.1 8.0*   Liver Function Tests:  Recent Labs Lab 07/21/14 1134  AST 23  ALT 25  ALKPHOS 89  BILITOT 0.3  PROT 8.0  ALBUMIN 3.2*   CBC:  Recent Labs Lab 07/21/14 1134 07/22/14 0442 07/23/14 0415 07/24/14 0520  WBC 28.9* 20.5* 17.3* 19.5*  HGB 12.5* 9.2* 9.2* 10.4*  HCT 36.0* 27.0* 27.6* 30.6*  MCV 95.5 94.4 96.2 95.0  PLT 391 316 347 403*   Cardiac Enzymes:  Recent Labs Lab 07/21/14 1217  CKTOTAL 36*   Recent Results (from the past 240 hour(s))  Culture, blood (routine x 2)     Status: None (Preliminary result)   Collection Time: 07/21/14  4:14 PM  Result Value Ref Range Status   Specimen Description BLOOD LEFT WRIST  Final   Special Requests BOTTLES DRAWN AEROBIC AND ANAEROBIC 5CC  Final   Culture   Final           BLOOD CULTURE RECEIVED NO GROWTH TO DATE CULTURE WILL BE HELD FOR 5 DAYS BEFORE ISSUING A FINAL NEGATIVE REPORT Performed at [facility]    Report Status PENDING  Incomplete  Culture, blood (routine x 2)      Status: None (Preliminary result)   Collection Time: 07/21/14  4:14 PM  Result Value Ref Range Status   Specimen Description BLOOD RIGHT HAND  Final   Special Requests BOTTLES DRAWN AEROBIC AND ANAEROBIC 5CC  Final   Culture   Final           BLOOD CULTURE RECEIVED NO GROWTH TO DATE CULTURE WILL BE HELD FOR 5 DAYS BEFORE ISSUING A FINAL NEGATIVE REPORT Performed at [facility]    Report Status PENDING  Incomplete  Culture, sputum-assessment     Status: None   Collection Time: 07/22/14  3:05 AM  Result Value Ref Range Status   Specimen Description SPUTUM  Final   Special Requests Normal  Final   Sputum evaluation   Final    THIS SPECIMEN IS ACCEPTABLE. RESPIRATORY CULTURE REPORT TO FOLLOW.   Report Status  07/22/2014 FINAL  Final  Culture, respiratory (NON-Expectorated)     Status: None   Collection Time: 07/22/14  3:05 AM  Result Value Ref Range Status   Specimen Description SPUTUM  Final   Special Requests NONE  Final   Gram Stain   Final    MODERATE WBC PRESENT,BOTH PMN AND MONONUCLEAR RARE SQUAMOUS EPITHELIAL CELLS PRESENT FEW GRAM NEGATIVE RODS Performed at [facility]    Culture   Final    NORMAL OROPHARYNGEAL FLORA Performed at [facility]    Report Status 07/24/2014 FINAL  Final     Scheduled Meds: . azithromycin  500 mg Oral Q24H  . cefTRIAXone (ROCEPHIN)  IV  1 g Intravenous Q24H  . citalopram  20 mg Oral QHS  . enoxaparin (LOVENOX) injection  40 mg Subcutaneous Q24H  . hydrochlorothiazide  25 mg Oral Daily  .  morphine injection  2 mg Intravenous Once  . pantoprazole  40 mg Oral Daily   Continuous Infusions: . sodium chloride 10 mL/hr at 07/23/14 1516

## 2014-07-25 LAB — CBC
HCT: 31 % — ABNORMAL LOW (ref 39.0–52.0)
Hemoglobin: 10.7 g/dL — ABNORMAL LOW (ref 13.0–17.0)
MCH: 32 pg (ref 26.0–34.0)
MCHC: 34.5 g/dL (ref 30.0–36.0)
MCV: 92.8 fL (ref 78.0–100.0)
Platelets: 438 10*3/uL — ABNORMAL HIGH (ref 150–400)
RBC: 3.34 MIL/uL — AB (ref 4.22–5.81)
RDW: 13.3 % (ref 11.5–15.5)
WBC: 20.2 10*3/uL — ABNORMAL HIGH (ref 4.0–10.5)

## 2014-07-25 LAB — BASIC METABOLIC PANEL
ANION GAP: 12 (ref 5–15)
BUN: 20 mg/dL (ref 6–20)
CALCIUM: 8.1 mg/dL — AB (ref 8.9–10.3)
CHLORIDE: 94 mmol/L — AB (ref 101–111)
CO2: 30 mmol/L (ref 22–32)
Creatinine, Ser: 1.18 mg/dL (ref 0.61–1.24)
GFR calc Af Amer: >60 mL/min (ref >60)
Glucose, Bld: 129 mg/dL — ABNORMAL HIGH (ref 65–99)
POTASSIUM: 2.8 mmol/L — AB (ref 3.5–5.1)
Sodium: 136 mmol/L (ref 135–145)

## 2014-07-25 MED ORDER — MENTHOL 3 MG MT LOZG
1.0000 | LOZENGE | OROMUCOSAL | Status: DC | PRN
  Administered 2014-07-25: 3 mg via ORAL
  Filled 2014-07-25: qty 9

## 2014-07-25 MED ORDER — IPRATROPIUM-ALBUTEROL 0.5-2.5 (3) MG/3ML IN SOLN
3.0000 mL | Freq: Four times a day (QID) | RESPIRATORY_TRACT | Status: DC
  Administered 2014-07-25 – 2014-07-26 (×6): 3 mL via RESPIRATORY_TRACT
  Filled 2014-07-25 (×7): qty 3

## 2014-07-25 MED ORDER — POTASSIUM CHLORIDE CRYS ER 20 MEQ PO TBCR
40.0000 meq | EXTENDED_RELEASE_TABLET | Freq: Two times a day (BID) | ORAL | Status: AC
  Administered 2014-07-25 (×2): 40 meq via ORAL
  Filled 2014-07-25 (×2): qty 2

## 2014-07-25 NOTE — Progress Notes (Signed)
Patient ID: Shane Vasquez, male   DOB: 1950/08/09, 64 y.o.   MRN: [id number]  [facility] PROGRESS NOTE  TAVISH GETTIS ZDG:[id number] DOB: [birth year] DOA: 07/21/2014 PCP: [name], MD  Brief narrative:    64 year old male with history of hypertension, hyperlipidemia and GERD presented to the ED with 4 days history of fever with chills, sweating, a/w nonproductive cough and left lateral chest pain with deep inspiration and coughing. Tmax at home 102.13F.  In the ED patient had significant leukocytosis with WBC of 29 K, chest x-ray and CT angiogram done showed extensive left lower lobe pneumonia with reactive left hilar adenopathy and a small loculated left pleural effusion.  Assessment/Plan:    Sepsis due toLeft lower lobe pneumonia - meets sepsis criteria with elevated wbc, fever, and tachycardia, source lobar PNA but unknown pathogen at this time, likely CAP - continue empiric IV Rocephin and azithromycin - blood culture so far negative, respiratory culture shows few GNR, urine for strep antigen and Legionella negative. - Continue O2 via nasal cannula. When necessary Vicodin for pleurisy.  - repeat CXR 6/7 with interval development and pulmonary vascular congestion and worsening pneumonia - pt overall feeling better but still oxygen dependent, also WBC is slightly up, will not de escalate ABX at this time   Active Problems:   Acute on chronic respiratory distress due to Acute diastolic CHF imposed on COPD Emphysema  - Secondary to IV fluids patient has received for the problem above, sepsis secondary to pneumonia - Lasix 40 mg IV 1 dose given 07/23/2014, additional dose given 6/8 - Weight on admission 163 lbs and 158 lbs this AM, monitor daily weights, strict I/O    Chronic respiratory disease, emphysema/COPD - oxygen saturations checked with ambulation, needs 3 L O2 while ambulating to keep O2 sat > 92% - continue BD's scheduled and as needed  - continue  antitussives scheduled and as needed     Acute kidney injury - Likely secondary to dehydration. Improved with fluids and Cr WNL 07/22/14 - repeat BMP in AM   Dyslipidemia - Cont home medication   Essential hypertension - reasonable inpatient control  - Continue HCTZ    Hypokalemia - Secondary to Lasix and BD's - supplement and repeat BMP in AM    Depression - Stable. Continue Celexa.    Anemia of chronic disease IDA - no signs of active bleeding  - repeat CBC in AM  DVT prophylaxis - Lovenox SQ  Code Status: Full.  Family Communication:  plan of care discussed with the patient Disposition Plan: Home 07/26/2014 as still with low K and WBC is up this AM  IV access:  Peripheral IV  Procedures and diagnostic studies:    Dg Chest 2 View 07/23/2014  Interval development of CHF and/or fluid overload since the examinations 2 days ago, with stable mild cardiomegaly and moderate diffuse interstitial pulmonary edema. 2. Worsening pneumonia involving the left lower lobe. 3. Bilateral pleural effusions, moderate-sized on the left and small on the right.     Dg Chest 2 View 07/21/2014   Question COPD changes with mi subsegmental atelectasis RIGHT base.  LEFT lower lobe consolidation consistent with pneumonia.  Followup PA and lateral chest X-ray is recommended in 3-4 weeks following trial of antibiotic therapy to ensure resolution and exclude underlying malignancy.    Ct Angio Chest Pe W/cm &/or Wo Cm 07/21/2014    Extensive consolidative pneumonia in the left lower lobe with reactive left hilar adenopathy. 2. Small loculated left  pleural effusion. 3. Atelectasis at the right lung base. 4. Emphysema.    Medical Consultants:  None  Other Consultants:  None  IAnti-Infectives:   Zithromax 07/21/2014 --> Rocephin 07/21/2014 -->  Faye Ramsay, MD  [facility] Pager [phone]  If 7PM-7AM, please contact night-coverage [email or web address] Password [email or web address] 07/25/2014, 10:15 AM   LOS: 4 days    HPI/Subjective: No events overnight.   Objective: Filed Vitals:   07/24/14 2025 07/24/14 2107 07/24/14 2108 07/25/14 0529  BP:  95/83 96/53 108/66  Pulse:  92  77  Temp:  98.5 F (36.9 C)  98.2 F (36.8 C)  TempSrc:  Oral  Oral  Resp:  21  20  Height:      Weight:    71.668 kg (158 lb)  SpO2: 91% 94%  97%    Intake/Output Summary (Last 24 hours) at 07/25/14 1015 Last data filed at 07/24/14 2107  Gross per 24 hour  Intake    120 ml  Output   2500 ml  Net  -2380 ml    Exam:   General:  Pt is alert, follows commands appropriately, not in acute distress  Cardiovascular: Regular rate and rhythm, S1/S2, no murmurs, no rubs, no gallops  Respiratory: Rhonchi as on the left side, no crackles, no wheezing  Abdomen: Soft, non tender, non distended, bowel sounds present, no guarding  Extremities:  pulses DP and PT palpable bilaterally  Neuro: Grossly nonfocal  Data Reviewed: Basic Metabolic Panel:  Recent Labs Lab 07/21/14 1134 07/22/14 0442 07/24/14 1200 07/25/14 0515  NA 135 139 136 136  K 3.7 3.3* 3.4* 2.8*  CL 98* 108 102 94*  CO2 26 21* 25 30  GLUCOSE 116* 131* 106* 129*  BUN 22* '15 17 20  '$ CREATININE 1.33* 1.07 1.14 1.18  CALCIUM 9.1 8.0* 8.0* 8.1*   Liver Function Tests:  Recent Labs Lab 07/21/14 1134  AST 23  ALT 25  ALKPHOS 89  BILITOT 0.3  PROT 8.0  ALBUMIN 3.2*   CBC:  Recent Labs Lab 07/21/14 1134 07/22/14 0442 07/23/14 0415 07/24/14 0520 07/25/14 0515  WBC 28.9* 20.5* 17.3* 19.5* 20.2*  HGB 12.5* 9.2* 9.2* 10.4* 10.7*  HCT 36.0* 27.0* 27.6* 30.6* 31.0*  MCV 95.5 94.4 96.2 95.0 92.8  PLT 391 316 347 403* 438*   Cardiac Enzymes:  Recent Labs Lab 07/21/14 1217  CKTOTAL 36*   Recent Results (from the past 240 hour(s))  Culture, blood (routine x 2)     Status: None (Preliminary result)   Collection Time: 07/21/14  4:14 PM  Result Value Ref Range Status   Specimen Description BLOOD LEFT WRIST  Final   Special Requests  BOTTLES DRAWN AEROBIC AND ANAEROBIC 5CC  Final   Culture   Final           BLOOD CULTURE RECEIVED NO GROWTH TO DATE CULTURE WILL BE HELD FOR 5 DAYS BEFORE ISSUING A FINAL NEGATIVE REPORT Performed at [facility]    Report Status PENDING  Incomplete  Culture, blood (routine x 2)     Status: None (Preliminary result)   Collection Time: 07/21/14  4:14 PM  Result Value Ref Range Status   Specimen Description BLOOD RIGHT HAND  Final   Special Requests BOTTLES DRAWN AEROBIC AND ANAEROBIC 5CC  Final   Culture   Final           BLOOD CULTURE RECEIVED NO GROWTH TO DATE CULTURE WILL BE HELD FOR 5 DAYS BEFORE ISSUING A FINAL NEGATIVE REPORT Performed at  [facility]    Report Status PENDING  Incomplete  Culture, sputum-assessment     Status: None   Collection Time: 07/22/14  3:05 AM  Result Value Ref Range Status   Specimen Description SPUTUM  Final   Special Requests Normal  Final   Sputum evaluation   Final    THIS SPECIMEN IS ACCEPTABLE. RESPIRATORY CULTURE REPORT TO FOLLOW.   Report Status 07/22/2014 FINAL  Final  Culture, respiratory (NON-Expectorated)     Status: None   Collection Time: 07/22/14  3:05 AM  Result Value Ref Range Status   Specimen Description SPUTUM  Final   Special Requests NONE  Final   Gram Stain   Final    MODERATE WBC PRESENT,BOTH PMN AND MONONUCLEAR RARE SQUAMOUS EPITHELIAL CELLS PRESENT FEW GRAM NEGATIVE RODS Performed at [facility]    Culture   Final    NORMAL OROPHARYNGEAL FLORA Performed at [facility]    Report Status 07/24/2014 FINAL  Final     Scheduled Meds: . azithromycin  500 mg Oral Q24H  . cefTRIAXone (ROCEPHIN)  IV  1 g Intravenous Q24H  . citalopram  20 mg Oral QHS  . enoxaparin (LOVENOX) injection  40 mg Subcutaneous Q24H  . guaiFENesin  600 mg Oral BID  . hydrochlorothiazide  25 mg Oral Daily  . ipratropium-albuterol  3 mL Nebulization TID  .  morphine injection  2 mg Intravenous Once  .  pantoprazole  40 mg Oral Daily  . potassium chloride  40 mEq Oral BID   Continuous Infusions: . sodium chloride 10 mL/hr at 07/23/14 1516

## 2014-07-25 NOTE — Evaluation (Signed)
Physical Therapy Evaluation Patient Details Name: Shane Vasquez MRN: [id number] DOB: 12-13-1950 Today's Date: 07/25/2014   History of Present Illness  64 year old male with history of GERD, hypertension and hyperlipidemia who presented to the ED 6/5  with 4 day history of fever with chills and sweating since past 4 days. He reports nonproductive cough with left lateral and posterior chest pain with movement and deep inspiration and coughing. He reports having frequent fever for the past 4 days with maximum temperature of 102.42F  CT chest c/W left lower lobe  reactive hilar  adenopathy.  Clinical Impression  Patient tolerated ambulation well, does not require AD. Sats > 88% on RA x 200'. Patient will benefit from PT to address problems listed in PT Problem list below.    Follow Up Recommendations No PT follow up    Equipment Recommendations  None recommended by PT    Recommendations for Other Services       Precautions / Restrictions Precautions Precaution Comments: monitor sats      Mobility  Bed Mobility Overal bed mobility: Independent                Transfers Overall transfer level: Independent                  Ambulation/Gait Ambulation/Gait assistance: Supervision Ambulation Distance (Feet): 400 Feet Assistive device: None Gait Pattern/deviations: Decreased stride length     General Gait Details: steady gait, slow  Stairs            Wheelchair Mobility    Modified Rankin (Stroke Patients Only)       Balance Overall balance assessment: Independent                                           Pertinent Vitals/Pain Pain Assessment: 0-10 Pain Score: 3  Pain Location: L side of chest Pain Descriptors / Indicators: Discomfort Pain Intervention(s): Monitored during session    Home Living Family/patient expects to be discharged to:: Private residence Living Arrangements: Spouse/significant other Available Help at  Discharge: Available PRN/intermittently;Family Type of Home: House Home Access: Stairs to enter Entrance Stairs-Rails: Right Entrance Stairs-Number of Steps: 2 Home Layout: One level Home Equipment: None Additional Comments: wife works at business they own    Prior Function Level of Independence: Independent               Journalist, newspaper        Extremity/Trunk Assessment   Upper Extremity Assessment: Overall WFL for tasks assessed           Lower Extremity Assessment: Overall WFL for tasks assessed      Cervical / Trunk Assessment: Normal  Communication   Communication: No difficulties  Cognition Arousal/Alertness: Awake/alert Behavior During Therapy: WFL for tasks assessed/performed Overall Cognitive Status: Within Functional Limits for tasks assessed                      General Comments      Exercises        Assessment/Plan    PT Assessment Patient needs continued PT services  PT Diagnosis Generalized weakness   PT Problem List Decreased activity tolerance;Decreased mobility  PT Treatment Interventions Gait training;Functional mobility training   PT Goals (Current goals can be found in the Care Plan section) Acute Rehab PT Goals Patient Stated Goal: to go home PT Goal  Formulation: With patient Time For Goal Achievement: 08/08/14 Potential to Achieve Goals: Good    Frequency Min 3X/week   Barriers to discharge        Co-evaluation               End of Session   Activity Tolerance: Patient tolerated treatment well Patient left: in chair;with call bell/phone within reach Nurse Communication: Mobility status         Time: 9509-3267 PT Time Calculation (min) (ACUTE ONLY): 22 min   Charges:   PT Evaluation $Initial PT Evaluation Tier I: 1 Procedure     PT G CodesClaretha Cooper 07/25/2014, 10:16 AM [name] PT [phone]

## 2014-07-25 NOTE — Progress Notes (Signed)
07/25/14 SATURATION QUALIFICATIONS: (This note is used to comply with regulatory documentation for home oxygen)  Patient Saturations on Room Air at Rest = 94%  Patient Saturations on Room Air while Ambulating = 88% x 200'  Patient Saturations on 3 Liters of oxygen while Ambulating = 96% x 200'  Please briefly explain why patient needs home oxygen: lowest saturation 88% [name] PT [phone]

## 2014-07-26 ENCOUNTER — Inpatient Hospital Stay (HOSPITAL_COMMUNITY): Payer: Medicare Other

## 2014-07-26 ENCOUNTER — Encounter (HOSPITAL_COMMUNITY): Payer: Self-pay | Admitting: Radiology

## 2014-07-26 DIAGNOSIS — J948 Other specified pleural conditions: Secondary | ICD-10-CM

## 2014-07-26 DIAGNOSIS — J90 Pleural effusion, not elsewhere classified: Secondary | ICD-10-CM

## 2014-07-26 LAB — ABO/RH: ABO/RH(D): O POS

## 2014-07-26 LAB — BODY FLUID CELL COUNT WITH DIFFERENTIAL
Eos, Fluid: 0 %
LYMPHS FL: 9 %
MONOCYTE-MACROPHAGE-SEROUS FLUID: 17 % — AB (ref 50–90)
NEUTROPHIL FLUID: 74 % — AB (ref 0–25)
Total Nucleated Cell Count, Fluid: 285 cu mm (ref 0–1000)

## 2014-07-26 LAB — BLOOD GAS, ARTERIAL
Acid-Base Excess: 0.2 mmol/L (ref 0.0–2.0)
Bicarbonate: 23.3 mEq/L (ref 20.0–24.0)
Drawn by: 406621
O2 Content: 2 L/min
O2 Saturation: 93.9 %
Patient temperature: 98.6
TCO2: 24.2 mmol/L (ref 0–100)
pCO2 arterial: 31.3 mmHg — ABNORMAL LOW (ref 35.0–45.0)
pH, Arterial: 7.485 — ABNORMAL HIGH (ref 7.350–7.450)
pO2, Arterial: 69.1 mmHg — ABNORMAL LOW (ref 80.0–100.0)

## 2014-07-26 LAB — APTT: aPTT: 32 seconds (ref 24–37)

## 2014-07-26 LAB — BASIC METABOLIC PANEL
Anion gap: 7 (ref 5–15)
BUN: 18 mg/dL (ref 6–20)
CALCIUM: 7.8 mg/dL — AB (ref 8.9–10.3)
CO2: 26 mmol/L (ref 22–32)
CREATININE: 1.05 mg/dL (ref 0.61–1.24)
Chloride: 100 mmol/L — ABNORMAL LOW (ref 101–111)
GFR calc non Af Amer: >60 mL/min (ref >60)
Glucose, Bld: 114 mg/dL — ABNORMAL HIGH (ref 65–99)
Potassium: 3.5 mmol/L (ref 3.5–5.1)
Sodium: 133 mmol/L — ABNORMAL LOW (ref 135–145)

## 2014-07-26 LAB — URINALYSIS, ROUTINE W REFLEX MICROSCOPIC
Bilirubin Urine: NEGATIVE
Glucose, UA: NEGATIVE mg/dL
Hgb urine dipstick: NEGATIVE
Ketones, ur: NEGATIVE mg/dL
Leukocytes, UA: NEGATIVE
Nitrite: NEGATIVE
Protein, ur: 30 mg/dL — AB
Specific Gravity, Urine: 1.03 (ref 1.005–1.030)
Urobilinogen, UA: 0.2 mg/dL (ref 0.0–1.0)
pH: 6.5 (ref 5.0–8.0)

## 2014-07-26 LAB — COMPREHENSIVE METABOLIC PANEL
ALT: 42 U/L (ref 17–63)
AST: 45 U/L — ABNORMAL HIGH (ref 15–41)
Albumin: 2 g/dL — ABNORMAL LOW (ref 3.5–5.0)
Alkaline Phosphatase: 105 U/L (ref 38–126)
Anion gap: 10 (ref 5–15)
BUN: 13 mg/dL (ref 6–20)
CO2: 27 mmol/L (ref 22–32)
Calcium: 8.2 mg/dL — ABNORMAL LOW (ref 8.9–10.3)
Chloride: 98 mmol/L — ABNORMAL LOW (ref 101–111)
Creatinine, Ser: 1.02 mg/dL (ref 0.61–1.24)
GFR calc Af Amer: >60 mL/min (ref >60)
GFR calc non Af Amer: >60 mL/min (ref >60)
Glucose, Bld: 113 mg/dL — ABNORMAL HIGH (ref 65–99)
Potassium: 3.5 mmol/L (ref 3.5–5.1)
Sodium: 135 mmol/L (ref 135–145)
Total Bilirubin: 0.4 mg/dL (ref 0.3–1.2)
Total Protein: 6.3 g/dL — ABNORMAL LOW (ref 6.5–8.1)

## 2014-07-26 LAB — CBC
HCT: 30 % — ABNORMAL LOW (ref 39.0–52.0)
HCT: 31.6 % — ABNORMAL LOW (ref 39.0–52.0)
HEMOGLOBIN: 10.3 g/dL — AB (ref 13.0–17.0)
Hemoglobin: 11.1 g/dL — ABNORMAL LOW (ref 13.0–17.0)
MCH: 32.2 pg (ref 26.0–34.0)
MCH: 32.5 pg (ref 26.0–34.0)
MCHC: 34.3 g/dL (ref 30.0–36.0)
MCHC: 35.1 g/dL (ref 30.0–36.0)
MCV: 93.8 fL (ref 78.0–100.0)
MCV: 92.4 fL (ref 78.0–100.0)
Platelets: 467 10*3/uL — ABNORMAL HIGH (ref 150–400)
Platelets: 529 10*3/uL — ABNORMAL HIGH (ref 150–400)
RBC: 3.2 MIL/uL — AB (ref 4.22–5.81)
RBC: 3.42 MIL/uL — ABNORMAL LOW (ref 4.22–5.81)
RDW: 13.5 % (ref 11.5–15.5)
RDW: 13.7 % (ref 11.5–15.5)
WBC: 18.7 10*3/uL — AB (ref 4.0–10.5)
WBC: 23.2 10*3/uL — ABNORMAL HIGH (ref 4.0–10.5)

## 2014-07-26 LAB — PROTEIN, BODY FLUID: Total protein, fluid: 3.1 g/dL

## 2014-07-26 LAB — PROTIME-INR
INR: 1.19 (ref 0.00–1.49)
Prothrombin Time: 15.3 seconds — ABNORMAL HIGH (ref 11.6–15.2)

## 2014-07-26 LAB — LACTATE DEHYDROGENASE: LDH: 164 U/L (ref 98–192)

## 2014-07-26 LAB — PROTEIN, TOTAL: Total Protein: 6.4 g/dL — ABNORMAL LOW (ref 6.5–8.1)

## 2014-07-26 LAB — URINE MICROSCOPIC-ADD ON

## 2014-07-26 LAB — TYPE AND SCREEN
ABO/RH(D): O POS
Antibody Screen: NEGATIVE

## 2014-07-26 LAB — LACTATE DEHYDROGENASE, PLEURAL OR PERITONEAL FLUID: LD, Fluid: 601 U/L — ABNORMAL HIGH (ref 3–23)

## 2014-07-26 MED ORDER — PNEUMOCOCCAL VAC POLYVALENT 25 MCG/0.5ML IJ INJ
0.5000 mL | INJECTION | INTRAMUSCULAR | Status: DC
  Filled 2014-07-26: qty 0.5

## 2014-07-26 MED ORDER — KETOROLAC TROMETHAMINE 15 MG/ML IJ SOLN
15.0000 mg | INTRAMUSCULAR | Status: AC
Start: 2014-07-26 — End: 2014-07-26
  Administered 2014-07-26: 15 mg via INTRAVENOUS
  Filled 2014-07-26: qty 1

## 2014-07-26 MED ORDER — POTASSIUM CHLORIDE CRYS ER 20 MEQ PO TBCR
40.0000 meq | EXTENDED_RELEASE_TABLET | Freq: Once | ORAL | Status: AC
  Administered 2014-07-26: 40 meq via ORAL
  Filled 2014-07-26: qty 2

## 2014-07-26 MED ORDER — IOHEXOL 300 MG/ML  SOLN
100.0000 mL | Freq: Once | INTRAMUSCULAR | Status: AC | PRN
  Administered 2014-07-26: 80 mL via INTRAVENOUS

## 2014-07-26 MED ORDER — KETOROLAC TROMETHAMINE 15 MG/ML IJ SOLN: 15.0000 mg | Freq: Four times a day (QID) | INTRAMUSCULAR | Status: DC | PRN

## 2014-07-26 NOTE — Progress Notes (Addendum)
Admission note:  Arrival Method: Patient arrived on stretcher via [facility] from [place] [facility] along with wife. Mental Orientation:  Alert and oriented x 4. Telemetry: N/A Assessment: See doc flow sheets. Skin: Warm, dry and intact, band aid present on left upper back d/t thoracentesis.  Assessed by two nurses, (Charito). IV: Right forearm saline lock. Pain: C/o pain, administered Toradol IV. Safety Measures: Bed in low position, bed alarm, call light and phone within reach. Admission Screening: Completed. 6700 Orientation: Patient has been oriented to the unit, staff and to the room.

## 2014-07-26 NOTE — Progress Notes (Signed)
Report called to Ulice Dash at [facility] arrived to transport patient.

## 2014-07-26 NOTE — Consult Note (Signed)
Name: Shane Vasquez MRN: [id number] DOB: [birth year]    ADMISSION DATE:  07/21/2014 CONSULTATION DATE: 6/11  REFERRING MD :  Doyle Askew  CHIEF COMPLAINT:  Pleural effusion   BRIEF PATIENT DESCRIPTION:  64 year old male admitted 6/2 w/ CAP (NOS). On admission had very small loculated left effusion. He remained febrile and still has sig leukocytosis in spite of 6 days CAP coverage. Because of this repeat CT imaging was obtained which showed increased size of left loculated effusion. Because of this PCCM was called for consult.    SIGNIFICANT EVENTS    STUDIES:  CT chest 6/10: 1. Moderate, partially loculated left pleural effusion, greatly increased in size from prior CT. 2. Persistent extensive left lower lobe pneumonia with increased compressive atelectasis from the effusion.3. New, small right pleural effusion. 4. Basilar right lower lobe atelectasis versus pneumonia.    HISTORY OF PRESENT ILLNESS:   64 year old male with history of GERD, hypertension and hyperlipidemia who presented to the ED on 6/5 with 4 day history of fever with chills and sweating. He reported nonproductive cough with left lateral and posterior chest pain with movement and deep inspiration and coughing. He reports having frequent fever for the past 4 days with maximum temperature of 102.28F which would response to Tylenol. Reported poor appetite. Patient denied headache, dizziness,nausea , vomiting, malaise, palpitations, SOB, abdominal pain, bowel or urinary symptoms. Denied change in weight . Denied recent travel or sick contact. CXR showed what looked like LLL PNA and he was admitted w/ working dx of CAP w/ small loculated effusion. He was treated w/ empiric CAP coverage. All culture data was negative, U strep was negative. In spite of this treatment he continued to have significant leukocytosis and spiking fever even after 6 days of antibiotics. A repeat CT chest was obtained which showed increased loculated left  effusion and because of this PCCM was called.    PAST MEDICAL HISTORY :   has a past medical history of CERVICAL RADICULOPATHY, RIGHT (08/12/2009); DIVERTICULOSIS, COLON (08/15/2006); GERD (08/15/2006); HYPERLIPIDEMIA (08/15/2006); HYPERTENSION (08/15/2006); INTERNAL HEMORRHOIDS (02/29/2008); and SKIN CANCER, HX OF (08/15/2006).  has past surgical history that includes Tonsillectomy. Prior to Admission medications   Medication Sig Start Date End Date Taking? Authorizing Provider  acetaminophen (TYLENOL) 500 MG tablet Take 1,000 mg by mouth every 6 (six) hours as needed for mild pain, moderate pain, fever or headache.   Yes Historical Provider, MD  citalopram (CELEXA) 40 MG tablet Take 1 tablet (40 mg total) by mouth daily. Patient taking differently: Take 20 mg by mouth daily.  02/19/14  Yes Marletta Lor, MD  EPINEPHrine (EPI-PEN) 0.3 mg/0.3 mL DEVI Inject 0.3 mLs (0.3 mg total) into the muscle once. 10/29/11  Yes Marletta Lor, MD  hydrochlorothiazide (HYDRODIURIL) 25 MG tablet Take 1 tablet (25 mg total) by mouth daily. 02/19/14  Yes Marletta Lor, MD  ibuprofen (ADVIL,MOTRIN) 200 MG tablet Take 200 mg by mouth every 4 (four) hours as needed for fever, headache, moderate pain or cramping.   Yes Historical Provider, MD  sildenafil (VIAGRA) 100 MG tablet Take 1 tablet (100 mg total) by mouth daily as needed. As directed 02/19/14  Yes Marletta Lor, MD  guaiFENesin (MUCINEX) 600 MG 12 hr tablet Take 1 tablet (600 mg total) by mouth 2 (two) times daily. 07/24/14   Theodis Blaze, MD  HYDROcodone-acetaminophen (NORCO/VICODIN) 5-325 MG per tablet Take 2 tablets by mouth every 4 (four) hours as needed for moderate pain (left sided  pleurisy). 07/24/14   Theodis Blaze, MD  ipratropium-albuterol (DUONEB) 0.5-2.5 (3) MG/3ML SOLN Take 3 mLs by nebulization every 4 (four) hours as needed. 07/24/14   Theodis Blaze, MD  ipratropium-albuterol (DUONEB) 0.5-2.5 (3) MG/3ML SOLN Take 3 mLs by nebulization 3  (three) times daily. 07/25/14   Theodis Blaze, MD  levofloxacin (LEVAQUIN) 500 MG tablet Take 1 tablet (500 mg total) by mouth daily. 07/24/14   Theodis Blaze, MD  rosuvastatin (CRESTOR) 10 MG tablet 1 tablet twice weekly Patient not taking: Reported on 07/21/2014 02/19/14 06/20/15  Marletta Lor, MD   Allergies  Allergen Reactions  . Bee Venom Anaphylaxis and Nausea And Vomiting    FAMILY HISTORY:  family history includes COPD in his father; Osteoporosis in his mother. SOCIAL HISTORY:  reports that he quit smoking about 22 months ago. His smoking use included Cigarettes. He smoked 1.00 pack per day. He has never used smokeless tobacco. He reports that he drinks alcohol. He reports that he does not use illicit drugs.  REVIEW OF SYSTEMS:   Constitutional: + fever, chills, weight loss, malaise/fatigue and diaphoresis.  HENT: Negative for hearing loss, ear pain, nosebleeds, congestion, sore throat, neck pain, tinnitus and ear discharge.   Eyes: Negative for blurred vision, double vision, photophobia, pain, discharge and redness.  Respiratory: + NP cough, hemoptysis, + shortness of breath, + pleuritic type CP, wheezing and stridor.   Cardiovascular: Negative for chest pain, palpitations, orthopnea, claudication, leg swelling and PND.  Gastrointestinal: Negative for heartburn, nausea, vomiting, abdominal pain, diarrhea, constipation, blood in stool and melena.  Genitourinary: Negative for dysuria, urgency, frequency, hematuria and flank pain.  Musculoskeletal: Negative for myalgias, back pain, joint pain and falls.  Skin: Negative for itching and rash.  Neurological: Negative for dizziness, tingling, tremors, sensory change, speech change, focal weakness, seizures, loss of consciousness, weakness and headaches.  Endo/Heme/Allergies: Negative for environmental allergies and polydipsia. Does not bruise/bleed easily.  SUBJECTIVE:  Feels a little better VITAL SIGNS: Temp:  [98.1 F (36.7 C)-102 F  (38.9 C)] 98.1 F (36.7 C) (06/10 0607) Pulse Rate:  [78-89] 78 (06/10 0607) Resp:  [20] 20 (06/10 0607) BP: (93-110)/(63-65) 110/63 mmHg (06/10 0607) SpO2:  [94 %-97 %] 97 % (06/10 1216) Weight:  [71.94 kg (158 lb 9.6 oz)] 71.94 kg (158 lb 9.6 oz) (06/10 0607)  PHYSICAL EXAMINATION: General:  Well developed 64 year old male Neuro:  Awake and alert, no focal def  HEENT:  [place], no JVD  Cardiovascular:  rrr Lungs:  Crackles right base, + friction rub left AFTER thoracentesis Abdomen:  Soft, non-tender + bowel sounds  Musculoskeletal:  Intact  Skin:  Intact    Recent Labs Lab 07/24/14 1200 07/25/14 0515 07/26/14 0550  NA 136 136 133*  K 3.4* 2.8* 3.5  CL 102 94* 100*  CO2 '25 30 26  '$ BUN '17 20 18  '$ CREATININE 1.14 1.18 1.05  GLUCOSE 106* 129* 114*    Recent Labs Lab 07/24/14 0520 07/25/14 0515 07/26/14 0550  HGB 10.4* 10.7* 10.3*  HCT 30.6* 31.0* 30.0*  WBC 19.5* 20.2* 18.7*  PLT 403* 438* 467*   Ct Chest W Contrast  07/26/2014   CLINICAL DATA:  Pneumonia, cough, and shortness of breath.  EXAM: CT CHEST WITH CONTRAST  TECHNIQUE: Multidetector CT imaging of the chest was performed during intravenous contrast administration.  CONTRAST:  91m OMNIPAQUE IOHEXOL 300 MG/ML  SOLN  COMPARISON:  07/21/2014 chest CT  FINDINGS: Small mediastinal lymph nodes are similar to  the prior study, most likely reactive. Heart size is within normal limits. There is no pericardial effusion.  There is a new, small right pleural effusion. Centrilobular emphysema is again seen. Minimal scarring or subsegmental atelectasis is noted in the right middle lobe, improved from prior. There is also linear scarring or subsegmental atelectasis laterally in the right lower lobe. There is a more confluent region of opacity involving the basilar right lower lobe with some air bronchograms.  Left pleural effusion has increased in size, now moderate. The effusion appears homogeneous and low in density with at most  minimal pleural thickening posteriorly. The effusion is partially loculated, medially more so than laterally near the lung apex. A small amount of fluid is also noted in the left major fissure. There is increased confluent opacity involving the majority of the left lower lobe which represents a combination of increased compressive atelectasis from the pleural effusion as well as persistent airspace filling due to the previously described pneumonia. No frank left lower lobe cavitation or abscess is identified. Mild atelectasis is present in the lingula.  Small hiatal hernia is less conspicuous than on the prior CT. No acute osseous abnormality is identified.  IMPRESSION: 1. Moderate, partially loculated left pleural effusion, greatly increased in size from prior CT. 2. Persistent extensive left lower lobe pneumonia with increased compressive atelectasis from the effusion. 3. New, small right pleural effusion. 4. Basilar right lower lobe atelectasis versus pneumonia.   Electronically Signed   By: Logan Bores   On: 07/26/2014 12:42  mod/marked left loculated effusion   ASSESSMENT / PLAN: Acute hypoxic respiratory failure in setting of CAP (NOS) and Loculated left and small right effusion  Pleuritic type chest pain   Discussion Bedside US guided thoracentesis yielded ~425 concentrated yellow pleural fluid. Unfortunately still has sig fluid collection seen via [place]  Plan Will cont current abx Have sent diagnostics: PH, LDH, proteins and cultures Will ask thoracics to eval    Erick Colace ACNP-BC [place] Pager # [phone] OR # [phone] if no answer   07/26/2014, 1:36 PM  PCCM ATTENDING: I have reviewed pt's initial presentation, consultants notes and hospital database in detail.  The above assessment and plan was formulated under my direction.  In summary: His initial presentation and radiographic picture are highly suggestive of PNA. There is no identified organism. He  failed to respond fully to abx therapy. Re-imaging studies revealed a large and partially loculated L pleural effusion. And he has had L pleuritic CP. I have supervised a thoracentesis performed by ACNP Kary Kos. This did not fully drain the pleural space. Post procedure, there was a pleural friction rub on the L consistent with pleuritis. I believe the pleural space needs to be fully evacuated to achieve the best and most expeditious recovery. I have spoken with Dr Servando Snare who intends to perform VATS 6/11. We have facilitated transfer to [facility] in anticipation of this procedure   Merton Border, MD;  PCCM service; [place] [phone]

## 2014-07-26 NOTE — Progress Notes (Signed)
Patient ID: Shane Vasquez, male   DOB: 08/30/1950, 64 y.o.   MRN: [id number]  [facility] PROGRESS NOTE  TORRIE LAFAVOR SHF:[id number] DOB: [birth year] DOA: 07/21/2014 PCP: [name], MD  Brief narrative:    64 year old male with history of hypertension, hyperlipidemia and GERD presented to the ED with 4 days history of fever with chills, sweating, a/w nonproductive cough and left lateral chest pain with deep inspiration and coughing. Tmax at home 102.94F.  In the ED patient had significant leukocytosis with WBC of 29 K, chest x-ray and CT angiogram done showed extensive left lower lobe pneumonia with reactive left hilar adenopathy and a small loculated left pleural effusion.  Assessment/Plan:    Sepsis due toLeft lower lobe pneumonia - meets sepsis criteria with elevated wbc, fever, and tachycardia, source lobar PNA but unknown pathogen at this time, likely CAP - has been on empiric IV Rocephin and azithromycin, today is day #6 but pt still febrile with T max 102F - repeat CT chest with worsening PNA and now with loculated effusion - PCCM consulted for further assistance, appreciate help   Active Problems:   Acute on chronic respiratory distress due to PNA, pleural effusion, COPD Emphysema  - Secondary to IV fluids patient has received for the problem above, sepsis secondary to pneumonia - Lasix 40 mg IV 1 dose given 07/23/2014, additional dose given 6/8 - Weight on admission 163 lbs and 158 lbs this AM, monitor daily weights, strict I/O - no additional lasix needed     Chronic respiratory disease, emphysema/COPD - oxygen saturations checked with ambulation, needs 3 L O2 while ambulating to keep O2 sat > 92% - continue BD's scheduled and as needed  - continue antitussives scheduled and as needed     Acute kidney injury - Likely secondary to dehydration. Improved with fluids and Cr WNL 07/22/14 - repeat BMP in AM   Dyslipidemia - Cont home medication    Essential hypertension - reasonable inpatient control  - Continue HCTZ    Hypokalemia - Secondary to Lasix and BD's - supplement and repeat BMP in AM    Depression - Stable. Continue Celexa.    Anemia of chronic disease IDA - no signs of active bleeding  - repeat CBC in AM  DVT prophylaxis - Lovenox SQ  Code Status: Full.  Family Communication:  plan of care discussed with the patient Disposition Plan: Not ready for d/c, PNA worse and now with loculated effusion   IV access:  Peripheral IV  Procedures and diagnostic studies:    Dg Chest 2 View 07/23/2014  Interval development of CHF and/or fluid overload since the examinations 2 days ago, with stable mild cardiomegaly and moderate diffuse interstitial pulmonary edema. 2. Worsening pneumonia involving the left lower lobe. 3. Bilateral pleural effusions, moderate-sized on the left and small on the right.     Dg Chest 2 View 07/21/2014   Question COPD changes with mi subsegmental atelectasis RIGHT base.  LEFT lower lobe consolidation consistent with pneumonia.  Followup PA and lateral chest X-ray is recommended in 3-4 weeks following trial of antibiotic therapy to ensure resolution and exclude underlying malignancy.    Ct Angio Chest Pe W/cm &/or Wo Cm 07/21/2014    Extensive consolidative pneumonia in the left lower lobe with reactive left hilar adenopathy. 2. Small loculated left pleural effusion. 3. Atelectasis at the right lung base. 4. Emphysema.    Ct Chest W Contrast 07/26/2014  Moderate, partially loculated left pleural effusion, greatly increased in  size from prior CT. 2. Persistent extensive left lower lobe pneumonia with increased compressive atelectasis from the effusion. 3. New, small right pleural effusion. 4. Basilar right lower lobe atelectasis versus pneumonia.   Medical Consultants:  PCCM  Other Consultants:  None  IAnti-Infectives:   Zithromax 07/21/2014 --> Rocephin 07/21/2014 -->  Faye Ramsay,  MD  [facility] Pager [phone]  If 7PM-7AM, please contact night-coverage [email or web address] Password [email or web address] 07/26/2014, 1:22 PM   LOS: 5 days   HPI/Subjective: No events overnight.   Objective: Filed Vitals:   07/25/14 2136 07/26/14 0607 07/26/14 0740 07/26/14 1216  BP: 93/65 110/63    Pulse: 84 78    Temp: 98.7 F (37.1 C) 98.1 F (36.7 C)    TempSrc: Oral Oral    Resp: 20 20    Height:      Weight:  71.94 kg (158 lb 9.6 oz)    SpO2: 96% 95% 94% 97%    Intake/Output Summary (Last 24 hours) at 07/26/14 1322 Last data filed at 07/26/14 1032  Gross per 24 hour  Intake    290 ml  Output    750 ml  Net   -460 ml    Exam:   General:  Pt is alert, follows commands appropriately, not in acute distress  Cardiovascular: Regular rate and rhythm, S1/S2, no murmurs, no rubs, no gallops  Respiratory: diminished breath sounds on the the left side, no wheezing   Abdomen: Soft, non tender, non distended, bowel sounds present, no guarding  Extremities:  pulses DP and PT palpable bilaterally  Neuro: Grossly nonfocal  Data Reviewed: Basic Metabolic Panel:  Recent Labs Lab 07/21/14 1134 07/22/14 0442 07/24/14 1200 07/25/14 0515 07/26/14 0550  NA 135 139 136 136 133*  K 3.7 3.3* 3.4* 2.8* 3.5  CL 98* 108 102 94* 100*  CO2 26 21* '25 30 26  '$ GLUCOSE 116* 131* 106* 129* 114*  BUN 22* '15 17 20 18  '$ CREATININE 1.33* 1.07 1.14 1.18 1.05  CALCIUM 9.1 8.0* 8.0* 8.1* 7.8*   Liver Function Tests:  Recent Labs Lab 07/21/14 1134  AST 23  ALT 25  ALKPHOS 89  BILITOT 0.3  PROT 8.0  ALBUMIN 3.2*   CBC:  Recent Labs Lab 07/22/14 0442 07/23/14 0415 07/24/14 0520 07/25/14 0515 07/26/14 0550  WBC 20.5* 17.3* 19.5* 20.2* 18.7*  HGB 9.2* 9.2* 10.4* 10.7* 10.3*  HCT 27.0* 27.6* 30.6* 31.0* 30.0*  MCV 94.4 96.2 95.0 92.8 93.8  PLT 316 347 403* 438* 467*   Cardiac Enzymes:  Recent Labs Lab 07/21/14 1217  CKTOTAL 36*   Recent Results (from the past 240 hour(s))  Culture,  blood (routine x 2)     Status: None (Preliminary result)   Collection Time: 07/21/14  4:14 PM  Result Value Ref Range Status   Specimen Description BLOOD LEFT WRIST  Final   Special Requests BOTTLES DRAWN AEROBIC AND ANAEROBIC 5CC  Final   Culture   Final           BLOOD CULTURE RECEIVED NO GROWTH TO DATE CULTURE WILL BE HELD FOR 5 DAYS BEFORE ISSUING A FINAL NEGATIVE REPORT Performed at [facility]    Report Status PENDING  Incomplete  Culture, blood (routine x 2)     Status: None (Preliminary result)   Collection Time: 07/21/14  4:14 PM  Result Value Ref Range Status   Specimen Description BLOOD RIGHT HAND  Final   Special Requests BOTTLES DRAWN AEROBIC AND ANAEROBIC 5CC  Final   Culture  Final           BLOOD CULTURE RECEIVED NO GROWTH TO DATE CULTURE WILL BE HELD FOR 5 DAYS BEFORE ISSUING A FINAL NEGATIVE REPORT Performed at [facility]    Report Status PENDING  Incomplete  Culture, sputum-assessment     Status: None   Collection Time: 07/22/14  3:05 AM  Result Value Ref Range Status   Specimen Description SPUTUM  Final   Special Requests Normal  Final   Sputum evaluation   Final    THIS SPECIMEN IS ACCEPTABLE. RESPIRATORY CULTURE REPORT TO FOLLOW.   Report Status 07/22/2014 FINAL  Final  Culture, respiratory (NON-Expectorated)     Status: None   Collection Time: 07/22/14  3:05 AM  Result Value Ref Range Status   Specimen Description SPUTUM  Final   Special Requests NONE  Final   Gram Stain   Final    MODERATE WBC PRESENT,BOTH PMN AND MONONUCLEAR RARE SQUAMOUS EPITHELIAL CELLS PRESENT FEW GRAM NEGATIVE RODS Performed at [facility]    Culture   Final    NORMAL OROPHARYNGEAL FLORA Performed at [facility]    Report Status 07/24/2014 FINAL  Final     Scheduled Meds: . azithromycin  500 mg Oral Q24H  . cefTRIAXone (ROCEPHIN)  IV  1 g Intravenous Q24H  . citalopram  20 mg Oral QHS  . enoxaparin (LOVENOX) injection  40 mg  Subcutaneous Q24H  . guaiFENesin  600 mg Oral BID  . hydrochlorothiazide  25 mg Oral Daily  . ipratropium-albuterol  3 mL Nebulization QID  .  morphine injection  2 mg Intravenous Once  . pantoprazole  40 mg Oral Daily  . [START ON 07/27/2014] pneumococcal 23 valent vaccine  0.5 mL Intramuscular Tomorrow-1000   Continuous Infusions: . sodium chloride 10 mL/hr at 07/23/14 1516

## 2014-07-26 NOTE — Consult Note (Signed)
[address]       [place] [address]             [phone]        Jonavin E Mcgroarty [place] Medical Record #[id number] Date of Birth: 1950-11-04  Referring: Dr Rosita Fire Primary Care: [name], MD  Chief Complaint:    Chief Complaint  Patient presents with  . Fever  . Flank Pain    History of Present Illness:     64 year old male with history of GERD, hypertension and hyperlipidemia who presented to the ED Monday  with 4 day history of fever with chills and sweating . He reports nonproductive cough with left lateral and posterior chest pain with movement and deep inspiration and coughing. He stared having  frequent fever for the past 4 days with maximum temperature of 102.43F . Patient is a previous smoker but quit 20 years ago.Denies work exposure to asbestosis or other known toxins, works as Programme researcher, broadcasting/film/video.   Current Activity/ Functional Status: Patient is independent with mobility/ambulation, transfers, ADL's, IADL's.   Zubrod Score: At the time of surgery this patient's most appropriate activity status/level should be described as: '[x]'$     0    Normal activity, no symptoms '[]'$     1    Restricted in physical strenuous activity but ambulatory, able to do out light work '[]'$     2    Ambulatory and capable of self care, unable to do work activities, up and about                 more than 50%  Of the time                            '[]'$     3    Only limited self care, in bed greater than 50% of waking hours '[]'$     4    Completely disabled, no self care, confined to bed or chair '[]'$     5    Moribund  Past Medical History  Diagnosis Date  . CERVICAL RADICULOPATHY, RIGHT 08/12/2009  . DIVERTICULOSIS, COLON 08/15/2006  . GERD 08/15/2006  . HYPERLIPIDEMIA 08/15/2006  . HYPERTENSION 08/15/2006  . INTERNAL HEMORRHOIDS 02/29/2008  . SKIN CANCER, HX OF 08/15/2006    Past Surgical History  Procedure Laterality Date  . Tonsillectomy      History    Smoking status  . Former Smoker -- 1.00 packs/day  . Types: Cigarettes  . Quit date: 08/29/2012  Smokeless tobacco  . Never Used    History  Alcohol Use  . Yes    History   Social History  . Marital Status: Married    Spouse Name: N/A  . Number of Children: N/A  . Years of Education: N/A   Occupational History  . Not on file.   Social History Main Topics  . Smoking status: Former Smoker -- 1.00 packs/day    Types: Cigarettes    Quit date: 08/29/2012  . Smokeless tobacco: Never Used  . Alcohol Use: Yes  . Drug Use: No  . Sexual Activity: Not on file   Other Topics Concern  . Not on file   Social History Narrative    Allergies  Allergen Reactions  . Bee Venom Anaphylaxis and Nausea And Vomiting    Current Facility-Administered Medications  Medication Dose Route Frequency Provider Last Rate Last Dose  . 0.9 %  sodium chloride  infusion   Intravenous Continuous Nishant Dhungel, MD 10 mL/hr at 07/23/14 1516    . acetaminophen (TYLENOL) tablet 650 mg  650 mg Oral Q6H PRN Nishant Dhungel, MD      . azithromycin (ZITHROMAX) tablet 500 mg  500 mg Oral Q24H Nishant Dhungel, MD   500 mg at 07/26/14 0930  . cefTRIAXone (ROCEPHIN) 1 g in dextrose 5 % 50 mL IVPB - Premix  1 g Intravenous Q24H Nishant Dhungel, MD   1 g at 07/26/14 1333  . citalopram (CELEXA) tablet 20 mg  20 mg Oral QHS Nishant Dhungel, MD   20 mg at 07/25/14 2109  . enoxaparin (LOVENOX) injection 40 mg  40 mg Subcutaneous Q24H Nishant Dhungel, MD   40 mg at 07/25/14 1852  . guaiFENesin (MUCINEX) 12 hr tablet 600 mg  600 mg Oral BID Theodis Blaze, MD   600 mg at 07/26/14 0930  . guaifenesin (ROBITUSSIN) 100 MG/5ML syrup 200 mg  200 mg Oral Q4H PRN Theodis Blaze, MD      . hydrochlorothiazide (HYDRODIURIL) tablet 25 mg  25 mg Oral Daily Nishant Dhungel, MD   25 mg at 07/26/14 0930  . HYDROcodone-acetaminophen (NORCO/VICODIN) 5-325 MG per tablet 2 tablet  2 tablet Oral Q4H PRN Nishant Dhungel, MD   2 tablet at  07/26/14 1340  . ipratropium-albuterol (DUONEB) 0.5-2.5 (3) MG/3ML nebulizer solution 3 mL  3 mL Nebulization Q2H PRN Theodis Blaze, MD      . ipratropium-albuterol (DUONEB) 0.5-2.5 (3) MG/3ML nebulizer solution 3 mL  3 mL Nebulization QID Theodis Blaze, MD   3 mL at 07/26/14 1616  . ketorolac (TORADOL) 15 MG/ML injection 15 mg  15 mg Intravenous Q6H PRN Erick Colace, NP      . menthol-cetylpyridinium (CEPACOL) lozenge 3 mg  1 lozenge Oral PRN Jeryl Columbia, NP   3 mg at 07/25/14 5176  . morphine 2 MG/ML injection 2 mg  2 mg Intravenous Once Nishant Dhungel, MD   2 mg at 07/23/14 1315  . ondansetron (ZOFRAN) tablet 4 mg  4 mg Oral Q6H PRN Nishant Dhungel, MD       Or  . ondansetron (ZOFRAN) injection 4 mg  4 mg Intravenous Q6H PRN Nishant Dhungel, MD      . pantoprazole (PROTONIX) EC tablet 40 mg  40 mg Oral Daily Nishant Dhungel, MD   40 mg at 07/26/14 0930  . [START ON 07/27/2014] pneumococcal 23 valent vaccine (PNU-IMMUNE) injection 0.5 mL  0.5 mL Intramuscular Tomorrow-1000 Theodis Blaze, MD        Prescriptions prior to admission  Medication Sig Dispense Refill Last Dose  . acetaminophen (TYLENOL) 500 MG tablet Take 1,000 mg by mouth every 6 (six) hours as needed for mild pain, moderate pain, fever or headache.   07/21/2014 at 0430  . citalopram (CELEXA) 40 MG tablet Take 1 tablet (40 mg total) by mouth daily. (Patient taking differently: Take 20 mg by mouth daily. ) 90 tablet 3 07/21/2014 at Unknown time  . EPINEPHrine (EPI-PEN) 0.3 mg/0.3 mL DEVI Inject 0.3 mLs (0.3 mg total) into the muscle once. 2 Device 1 unknown  . hydrochlorothiazide (HYDRODIURIL) 25 MG tablet Take 1 tablet (25 mg total) by mouth daily. 90 tablet 3 07/21/2014 at Unknown time  . ibuprofen (ADVIL,MOTRIN) 200 MG tablet Take 200 mg by mouth every 4 (four) hours as needed for fever, headache, moderate pain or cramping.   07/20/2014 at 2000  . sildenafil (VIAGRA) 100 MG  tablet Take 1 tablet (100 mg total) by mouth daily as  needed. As directed 10 tablet 5 unknown  . rosuvastatin (CRESTOR) 10 MG tablet 1 tablet twice weekly (Patient not taking: Reported on 07/21/2014) 90 tablet 3 Not Taking at Unknown time    Family History  Problem Relation Age of Onset  . Osteoporosis Mother   . COPD Father      Review of Systems:      Cardiac Review of Systems: Y or N  Chest Pain [  y  ]  Resting SOB [ n  ] Exertional SOB  [  y]  Orthopnea [ n ]   Pedal Edema [  n ]    Palpitations [n  ] Syncope  [n]   Presyncope [  n ]  General Review of Systems: [Y] = yes [  ]=no Constitional: recent weight change [  ]; anorexia [  ]; fatigue [  ]; nausea [  ]; night sweats [  ]; fever [  ]; or chills [  ]                                                               Dental: poor dentition[  ]; Last Dentist visit:   Eye : blurred vision [  ]; diplopia [   ]; vision changes [  ];  Amaurosis fugax[  ]; Resp: cough Blue.Reese ];  wheezing[n  ];  hemoptysis[n  ]; shortness of breath[ y ]; paroxysmal nocturnal dyspnea[  ]; dyspnea on exertion[  ]; or orthopnea[  ];  GI:  gallstones[  ], vomiting[ n ];  dysphagia[  ]; melena[  ];  hematochezia [  ]; heartburn[  ];   Hx of  Colonoscopy[  ]; GU: kidney stones [  ]; hematuria[  ];   dysuria [  ];  nocturia[  ];  history of     obstruction [  ]; urinary frequency [  ]             Skin: rash, swelling[  ];, hair loss[  ];  peripheral edema[  ];  or itching[  ]; Musculosketetal: myalgias[  ];  joint swelling[  ];  joint erythema[  ];  joint pain[  ];  back pain[  ];  Heme/Lymph: bruising[  ];  bleeding[  ];  anemia[  ];  Neuro: TIA[  ];  headaches[  ];  stroke[  ];  vertigo[  ];  seizures[  ];   paresthesias[  ];  difficulty walking[ n ];  Psych:depression[  ]; anxiety[  ];  Endocrine: diabetes[  ];  thyroid dysfunction[  ];  Immunizations: Flu Blue.Reese  ]; Pneumococcal[n  ];  Other:  Physical Exam: BP 132/69 mmHg  Pulse 82  Temp(Src) 99 F (37.2 C) (Oral)  Resp 20  Ht '5\' 10"'$  (1.778 m)  Wt 158 lb 6.4  oz (71.85 kg)  BMI 22.73 kg/m2  SpO2 97%   General appearance: alert and cooperative Head: Normocephalic, without obvious abnormality, atraumatic Neck: no adenopathy, no carotid bruit, no JVD, supple, symmetrical, trachea midline and thyroid not enlarged, symmetric, no tenderness/mass/nodules Lymph nodes: Cervical, supraclavicular, and axillary nodes normal. Resp: clear to auscultation  On right, decreased on the left Back: symmetric, no curvature. ROM normal. No CVA tenderness. Cardio:  regular rate and rhythm, S1, S2 normal, no murmur, click, rub or gallop GI: soft, non-tender; bowel sounds normal; no masses,  no organomegaly Extremities: extremities normal, atraumatic, no cyanosis or edema and Homans sign is negative, no sign of DVT Neurologic: Grossly normal  Diagnostic Studies & Laboratory data:     Recent Radiology Findings:   Ct Chest W Contrast  07/26/2014   CLINICAL DATA:  Pneumonia, cough, and shortness of breath.  EXAM: CT CHEST WITH CONTRAST  TECHNIQUE: Multidetector CT imaging of the chest was performed during intravenous contrast administration.  CONTRAST:  26m OMNIPAQUE IOHEXOL 300 MG/ML  SOLN  COMPARISON:  07/21/2014 chest CT  FINDINGS: Small mediastinal lymph nodes are similar to the prior study, most likely reactive. Heart size is within normal limits. There is no pericardial effusion.  There is a new, small right pleural effusion. Centrilobular emphysema is again seen. Minimal scarring or subsegmental atelectasis is noted in the right middle lobe, improved from prior. There is also linear scarring or subsegmental atelectasis laterally in the right lower lobe. There is a more confluent region of opacity involving the basilar right lower lobe with some air bronchograms.  Left pleural effusion has increased in size, now moderate. The effusion appears homogeneous and low in density with at most minimal pleural thickening posteriorly. The effusion is partially loculated, medially  more so than laterally near the lung apex. A small amount of fluid is also noted in the left major fissure. There is increased confluent opacity involving the majority of the left lower lobe which represents a combination of increased compressive atelectasis from the pleural effusion as well as persistent airspace filling due to the previously described pneumonia. No frank left lower lobe cavitation or abscess is identified. Mild atelectasis is present in the lingula.  Small hiatal hernia is less conspicuous than on the prior CT. No acute osseous abnormality is identified.  IMPRESSION: 1. Moderate, partially loculated left pleural effusion, greatly increased in size from prior CT. 2. Persistent extensive left lower lobe pneumonia with increased compressive atelectasis from the effusion. 3. New, small right pleural effusion. 4. Basilar right lower lobe atelectasis versus pneumonia.   Electronically Signed   By: ALogan Bores  On: 07/26/2014 12:42   Dg Chest Port 1 View  07/26/2014   CLINICAL DATA:  LEFT thoracentesis.  Evaluate for pneumothorax.  EXAM: PORTABLE CHEST - 1 VIEW  COMPARISON:  CT 07/26/2014.  Chest x-ray 07/23/2014.  FINDINGS: Interval decrease in the LEFT pleural effusion. No pneumothorax. Mildly improved aeration of the LEFT lung. Persistent consolidation of the LEFT lung base. Increased density at the RIGHT lung base likely representing atelectasis. Cardiopericardial silhouette is unchanged, enlarged.  IMPRESSION: No pneumothorax status post LEFT thoracentesis. Mildly improved aeration of the LEFT lung.   Electronically Signed   By: GDereck LigasM.D.   On: 07/26/2014 15:18     I have independently reviewed the above radiologic studies.  Recent Lab Findings: Lab Results  Component Value Date   WBC 23.2* 07/26/2014   HGB 11.1* 07/26/2014   HCT 31.6* 07/26/2014   PLT 529* 07/26/2014   GLUCOSE 114* 07/26/2014   CHOL 335* 02/13/2014   TRIG 140.0 02/13/2014   HDL 68.30 02/13/2014    LDLDIRECT 205.7 11/23/2011   LDLCALC 239* 02/13/2014   ALT 25 07/21/2014   AST 23 07/21/2014   NA 133* 07/26/2014   K 3.5 07/26/2014   CL 100* 07/26/2014   CREATININE 1.05 07/26/2014   BUN 18 07/26/2014  CO2 26 07/26/2014   TSH 1.36 02/13/2014   INR 1.19 07/26/2014      Assessment / Plan:   Left lower lobe pneumonia with erythema and incomplete drainage with thoracentesis. I have recommended proceeding with left VATS and drainage  of empyema in am.  The goals risks and alternatives of the planned surgical procedure Bronchoscopy with left VATS and drainage of Empyema   have been discussed with the patient in detail. The risks of the procedure including death, infection, stroke, myocardial infarction, bleeding, blood transfusion have all been discussed specifically.  I have quoted Tarvis E Evelyn a 2 % of perioperative mortality and a complication rate as high as 20 %. The patient's questions have been answered.Chance E Lukasik is willing  to proceed with the planned procedure.  [name] MD      [address] [place] [address] Office [phone]   Beeper [phone]  07/26/2014 7:27 PM

## 2014-07-26 NOTE — Procedures (Signed)
Thoracentesis Procedure Note  Pre-operative Diagnosis: left loculated pleural effusion   Post-operative Diagnosis: same  Indications: evaluation and persistent infection   Procedure Details  Consent: Informed consent was obtained. Risks of the procedure were discussed including: infection, bleeding, pain, pneumothorax.  Under sterile conditions the patient was positioned. Betadine solution and sterile drapes were utilized.  1% buffered lidocaine was used to anesthetize the pleural  Space which was identified via real time [place]. Fluid was obtained without any difficulties and minimal blood loss.  A dressing was applied to the wound and wound care instructions were provided.   Findings 425 ml of concentrated yellow pleural fluid was obtained, thick in nature. A sample was sent to Pathology for cell counts, as well as for infection analysis.  Complications:  None; patient tolerated the procedure well.          Condition: stable  Plan A follow up chest x-ray was ordered. Bed Rest for 0 hours. Tylenol 650 mg. for pain.    I was present for and supervised the entire procedure  Merton Border, MD ; [facility] service Mobile [phone].  After 5:30 PM or weekends, call [phone]

## 2014-07-26 NOTE — Care Management Note (Addendum)
Case Management Note  Patient Details  Name: Shane Vasquez MRN: [id number] Date of Birth: 1950-06-01  Subjective/Objective:          64 yo male admitted with LLL pna          Action/Plan: CM was contacted by patient staff nurse stating that patient will not be dc'd today as he will have a thoracentesis and possible further extensive work-up. Contacted [name] with [facility] and explained that the patient will not be dc'd today and depending on future interventions discharge needs may change. [name] stated that the home O2 will be cancelled and the neb machine will be picked up from the room. Patient aware and had no other questions. Will continue to follow for discharge planning needs.  Spoke with patient and spouse in room. Understand orders for home O2 and neb machine. [facility] DME rep, [name], made aware of order and discharge order. Patient understands that he will be contacted by [facility] for delivery of the home O2 and they will deliver a portable tank to the hospital for him to go home with, they will also deliver the neb machine to the room. Nurse is also aware that patient needs to receive portable O2 tank prior to DC. Patient verbalized understanding and no other needs at this time.  Expected Discharge Date:                  Expected Discharge Plan:  Home/Self Care  In-House Referral:  NA  Discharge planning Services  CM Consult  Post Acute Care Choice:  Durable Medical Equipment Choice offered to:  Patient  DME Arranged:  Oxygen, Nebulizer machine DME Agency:  [place]:  NA [place] Agency:  NA  Status of Service:  Completed, signed off  Medicare Important Message Given:  Yes Date Medicare IM Given:  07/26/14 Medicare IM give by:  [name] Date Additional Medicare IM Given:    Additional Medicare Important Message give by:     If discussed at [place] of Stay Meetings, dates discussed:    Additional Comments:  [name], RN 07/26/2014, 10:40 AM

## 2014-07-27 ENCOUNTER — Inpatient Hospital Stay (HOSPITAL_COMMUNITY): Payer: Medicare Other | Admitting: Certified Registered Nurse Anesthetist

## 2014-07-27 ENCOUNTER — Encounter (HOSPITAL_COMMUNITY)
Admission: EM | Disposition: A | Discharge status: Home / Self Care | Payer: Self-pay | Source: Home / Self Care | Attending: Internal Medicine

## 2014-07-27 ENCOUNTER — Inpatient Hospital Stay (HOSPITAL_COMMUNITY): Payer: Medicare Other

## 2014-07-27 DIAGNOSIS — J869 Pyothorax without fistula: Secondary | ICD-10-CM | POA: Insufficient documentation

## 2014-07-27 DIAGNOSIS — J189 Pneumonia, unspecified organism: Secondary | ICD-10-CM

## 2014-07-27 DIAGNOSIS — I959 Hypotension, unspecified: Secondary | ICD-10-CM

## 2014-07-27 DIAGNOSIS — R079 Chest pain, unspecified: Secondary | ICD-10-CM

## 2014-07-27 DIAGNOSIS — J918 Pleural effusion in other conditions classified elsewhere: Secondary | ICD-10-CM

## 2014-07-27 DIAGNOSIS — J9620 Acute and chronic respiratory failure, unspecified whether with hypoxia or hypercapnia: Secondary | ICD-10-CM

## 2014-07-27 HISTORY — PX: VIDEO BRONCHOSCOPY: SHX5072

## 2014-07-27 HISTORY — PX: VIDEO ASSISTED THORACOSCOPY (VATS)/EMPYEMA: SHX6172

## 2014-07-27 LAB — CULTURE, BLOOD (ROUTINE X 2)
CULTURE: NO GROWTH
Culture: NO GROWTH

## 2014-07-27 LAB — GRAM STAIN

## 2014-07-27 LAB — MRSA PCR SCREENING: MRSA by PCR: NEGATIVE

## 2014-07-27 LAB — PH, BODY FLUID: pH, Fluid: 8

## 2014-07-27 SURGERY — BRONCHOSCOPY, VIDEO-ASSISTED
Anesthesia: General | Site: Chest

## 2014-07-27 MED ORDER — ACETAMINOPHEN 160 MG/5ML PO SOLN
1000.0000 mg | Freq: Four times a day (QID) | ORAL | Status: AC
  Filled 2014-07-27: qty 40

## 2014-07-27 MED ORDER — MEPERIDINE HCL 25 MG/ML IJ SOLN: 6.2500 mg | INTRAMUSCULAR | Status: DC | PRN

## 2014-07-27 MED ORDER — PNEUMOCOCCAL VAC POLYVALENT 25 MCG/0.5ML IJ INJ: 0.5000 mL | INJECTION | INTRAMUSCULAR | Status: DC

## 2014-07-27 MED ORDER — SODIUM CHLORIDE 0.9 % IJ SOLN: 9.0000 mL | INTRAMUSCULAR | Status: DC | PRN

## 2014-07-27 MED ORDER — FENTANYL CITRATE (PF) 100 MCG/2ML IJ SOLN
INTRAMUSCULAR | Status: DC | PRN
  Administered 2014-07-27: 50 ug via INTRAVENOUS
  Administered 2014-07-27: 25 ug via INTRAVENOUS
  Administered 2014-07-27 (×3): 50 ug via INTRAVENOUS
  Administered 2014-07-27: 25 ug via INTRAVENOUS

## 2014-07-27 MED ORDER — FENTANYL 10 MCG/ML IV SOLN
INTRAVENOUS | Status: DC
  Administered 2014-07-27 (×2): 50 ug via INTRAVENOUS
  Administered 2014-07-27: 11:00:00 via INTRAVENOUS
  Administered 2014-07-27: 10 ug via INTRAVENOUS
  Administered 2014-07-28: 80 ug via INTRAVENOUS
  Administered 2014-07-28: 60 ug via INTRAVENOUS
  Administered 2014-07-28: 50 ug via INTRAVENOUS
  Filled 2014-07-27 (×2): qty 50

## 2014-07-27 MED ORDER — HYDROMORPHONE HCL 1 MG/ML IJ SOLN
INTRAMUSCULAR | Status: AC
  Filled 2014-07-27: qty 1

## 2014-07-27 MED ORDER — ONDANSETRON HCL 4 MG/2ML IJ SOLN
INTRAMUSCULAR | Status: DC | PRN
  Administered 2014-07-27: 4 mg via INTRAVENOUS

## 2014-07-27 MED ORDER — MIDAZOLAM HCL 5 MG/5ML IJ SOLN
INTRAMUSCULAR | Status: DC | PRN
  Administered 2014-07-27 (×2): 1 mg via INTRAVENOUS

## 2014-07-27 MED ORDER — DIPHENHYDRAMINE HCL 12.5 MG/5ML PO ELIX
12.5000 mg | ORAL_SOLUTION | Freq: Four times a day (QID) | ORAL | Status: DC | PRN
  Filled 2014-07-27: qty 5

## 2014-07-27 MED ORDER — ACETAMINOPHEN 500 MG PO TABS
1000.0000 mg | ORAL_TABLET | Freq: Four times a day (QID) | ORAL | Status: AC
  Administered 2014-07-27 – 2014-08-01 (×20): 1000 mg via ORAL
  Filled 2014-07-27 (×21): qty 2

## 2014-07-27 MED ORDER — ARTIFICIAL TEARS OP OINT
TOPICAL_OINTMENT | OPHTHALMIC | Status: DC | PRN
  Administered 2014-07-27: 1 via OPHTHALMIC

## 2014-07-27 MED ORDER — STERILE WATER FOR INJECTION IJ SOLN
INTRAMUSCULAR | Status: AC
  Filled 2014-07-27: qty 10

## 2014-07-27 MED ORDER — ONDANSETRON HCL 4 MG/2ML IJ SOLN: 4.0000 mg | Freq: Four times a day (QID) | INTRAMUSCULAR | Status: DC | PRN

## 2014-07-27 MED ORDER — NALOXONE HCL 0.4 MG/ML IJ SOLN: 0.4000 mg | INTRAMUSCULAR | Status: DC | PRN

## 2014-07-27 MED ORDER — HYDROMORPHONE HCL 1 MG/ML IJ SOLN
0.2500 mg | INTRAMUSCULAR | Status: DC | PRN
  Administered 2014-07-27 (×3): 0.5 mg via INTRAVENOUS

## 2014-07-27 MED ORDER — GLYCOPYRROLATE 0.2 MG/ML IJ SOLN
INTRAMUSCULAR | Status: DC | PRN
  Administered 2014-07-27: 0.6 mg via INTRAVENOUS

## 2014-07-27 MED ORDER — MIDAZOLAM HCL 2 MG/2ML IJ SOLN
INTRAMUSCULAR | Status: AC
  Filled 2014-07-27: qty 2

## 2014-07-27 MED ORDER — TRAMADOL HCL 50 MG PO TABS
50.0000 mg | ORAL_TABLET | Freq: Four times a day (QID) | ORAL | Status: DC | PRN
Start: 2014-07-27 — End: 2014-08-01
  Administered 2014-07-27 – 2014-07-31 (×8): 100 mg via ORAL
  Administered 2014-08-01: 50 mg via ORAL
  Filled 2014-07-27 (×8): qty 2
  Filled 2014-07-27: qty 1
  Filled 2014-07-27: qty 2

## 2014-07-27 MED ORDER — ROCURONIUM BROMIDE 50 MG/5ML IV SOLN
INTRAVENOUS | Status: AC
  Filled 2014-07-27: qty 1

## 2014-07-27 MED ORDER — SENNOSIDES-DOCUSATE SODIUM 8.6-50 MG PO TABS
1.0000 | ORAL_TABLET | Freq: Every day | ORAL | Status: DC
  Administered 2014-07-27 – 2014-07-30 (×4): 1 via ORAL
  Filled 2014-07-27 (×6): qty 1

## 2014-07-27 MED ORDER — ARTIFICIAL TEARS OP OINT
TOPICAL_OINTMENT | OPHTHALMIC | Status: AC
  Filled 2014-07-27: qty 3.5

## 2014-07-27 MED ORDER — PROPOFOL 10 MG/ML IV BOLUS
INTRAVENOUS | Status: AC
  Filled 2014-07-27: qty 20

## 2014-07-27 MED ORDER — DIPHENHYDRAMINE HCL 50 MG/ML IJ SOLN: 12.5000 mg | Freq: Four times a day (QID) | INTRAMUSCULAR | Status: DC | PRN

## 2014-07-27 MED ORDER — 0.9 % SODIUM CHLORIDE (POUR BTL) OPTIME
TOPICAL | Status: DC | PRN
  Administered 2014-07-27: 1000 mL

## 2014-07-27 MED ORDER — SUCCINYLCHOLINE CHLORIDE 20 MG/ML IJ SOLN
INTRAMUSCULAR | Status: AC
  Filled 2014-07-27: qty 1

## 2014-07-27 MED ORDER — POTASSIUM CHLORIDE 10 MEQ/50ML IV SOLN
10.0000 meq | Freq: Every day | INTRAVENOUS | Status: DC | PRN
  Filled 2014-07-27: qty 50

## 2014-07-27 MED ORDER — NEOSTIGMINE METHYLSULFATE 10 MG/10ML IV SOLN
INTRAVENOUS | Status: DC | PRN
  Administered 2014-07-27: 4 mg via INTRAVENOUS

## 2014-07-27 MED ORDER — LACTATED RINGERS IV SOLN
INTRAVENOUS | Status: DC | PRN
  Administered 2014-07-27: 07:00:00 via INTRAVENOUS

## 2014-07-27 MED ORDER — PROPOFOL 10 MG/ML IV BOLUS
INTRAVENOUS | Status: DC | PRN
  Administered 2014-07-27: 130 mg via INTRAVENOUS

## 2014-07-27 MED ORDER — DEXTROSE 5 % IV SOLN
500.0000 mg | INTRAVENOUS | Status: AC
  Administered 2014-07-27: 500 mg via INTRAVENOUS
  Filled 2014-07-27: qty 500

## 2014-07-27 MED ORDER — VANCOMYCIN HCL IN DEXTROSE 1-5 GM/200ML-% IV SOLN
1000.0000 mg | Freq: Two times a day (BID) | INTRAVENOUS | Status: DC
  Administered 2014-07-27 – 2014-07-30 (×8): 1000 mg via INTRAVENOUS
  Filled 2014-07-27 (×9): qty 200

## 2014-07-27 MED ORDER — SODIUM CHLORIDE 0.9 % IJ SOLN
INTRAMUSCULAR | Status: AC
  Filled 2014-07-27: qty 10

## 2014-07-27 MED ORDER — LIDOCAINE HCL (CARDIAC) 20 MG/ML IV SOLN
INTRAVENOUS | Status: DC | PRN
  Administered 2014-07-27: 100 mg via INTRAVENOUS

## 2014-07-27 MED ORDER — NEOSTIGMINE METHYLSULFATE 10 MG/10ML IV SOLN
INTRAVENOUS | Status: AC
  Filled 2014-07-27: qty 1

## 2014-07-27 MED ORDER — OXYCODONE HCL 5 MG PO TABS
5.0000 mg | ORAL_TABLET | ORAL | Status: DC | PRN
  Administered 2014-07-27 – 2014-07-29 (×7): 10 mg via ORAL
  Administered 2014-07-30: 5 mg via ORAL
  Administered 2014-07-30 – 2014-08-01 (×7): 10 mg via ORAL
  Filled 2014-07-27 (×7): qty 2
  Filled 2014-07-27: qty 1
  Filled 2014-07-27 (×8): qty 2

## 2014-07-27 MED ORDER — FENTANYL CITRATE (PF) 250 MCG/5ML IJ SOLN
INTRAMUSCULAR | Status: AC
  Filled 2014-07-27: qty 5

## 2014-07-27 MED ORDER — LIDOCAINE HCL (CARDIAC) 20 MG/ML IV SOLN
INTRAVENOUS | Status: AC
  Filled 2014-07-27: qty 5

## 2014-07-27 MED ORDER — ONDANSETRON HCL 4 MG/2ML IJ SOLN
INTRAMUSCULAR | Status: AC
  Filled 2014-07-27: qty 2

## 2014-07-27 MED ORDER — GLYCOPYRROLATE 0.2 MG/ML IJ SOLN
INTRAMUSCULAR | Status: AC
  Filled 2014-07-27: qty 3

## 2014-07-27 MED ORDER — VECURONIUM BROMIDE 10 MG IV SOLR
INTRAVENOUS | Status: DC | PRN
  Administered 2014-07-27: 1 mg via INTRAVENOUS

## 2014-07-27 MED ORDER — AZITHROMYCIN 500 MG PO TABS: 500.0000 mg | ORAL_TABLET | ORAL | Status: DC

## 2014-07-27 MED ORDER — ROCURONIUM BROMIDE 100 MG/10ML IV SOLN
INTRAVENOUS | Status: DC | PRN
  Administered 2014-07-27: 50 mg via INTRAVENOUS

## 2014-07-27 MED ORDER — PHENYLEPHRINE HCL 10 MG/ML IJ SOLN: INTRAMUSCULAR | Status: DC | PRN

## 2014-07-27 MED ORDER — VECURONIUM BROMIDE 10 MG IV SOLR
INTRAVENOUS | Status: AC
  Filled 2014-07-27: qty 10

## 2014-07-27 MED ORDER — BISACODYL 5 MG PO TBEC
10.0000 mg | DELAYED_RELEASE_TABLET | Freq: Every day | ORAL | Status: DC
  Administered 2014-07-28: 10 mg via ORAL
  Filled 2014-07-27 (×4): qty 2

## 2014-07-27 MED ORDER — KCL IN DEXTROSE-NACL 20-5-0.45 MEQ/L-%-% IV SOLN
INTRAVENOUS | Status: DC
Start: 2014-07-27 — End: 2014-07-28
  Administered 2014-07-27 (×2): via INTRAVENOUS
  Filled 2014-07-27 (×4): qty 1000

## 2014-07-27 MED ORDER — ONDANSETRON HCL 4 MG/2ML IJ SOLN: 4.0000 mg | Freq: Once | INTRAMUSCULAR | Status: DC | PRN

## 2014-07-27 MED ORDER — PIPERACILLIN-TAZOBACTAM 3.375 G IVPB
3.3750 g | Freq: Three times a day (TID) | INTRAVENOUS | Status: DC
  Administered 2014-07-27 – 2014-08-01 (×14): 3.375 g via INTRAVENOUS
  Filled 2014-07-27 (×18): qty 50

## 2014-07-27 MED ORDER — PHENYLEPHRINE HCL 10 MG/ML IJ SOLN
10.0000 mg | INTRAMUSCULAR | Status: DC | PRN
  Administered 2014-07-27: 40 ug/min via INTRAVENOUS

## 2014-07-27 SURGICAL SUPPLY — 82 items
APPLICATOR TIP COSEAL (VASCULAR PRODUCTS) IMPLANT
APPLICATOR TIP EXT COSEAL (VASCULAR PRODUCTS) IMPLANT
BLADE SURG 11 STRL SS (BLADE) ×3 IMPLANT
BRUSH CYTOL CELLEBRITY 1.5X140 (MISCELLANEOUS) IMPLANT
CANISTER SUCTION 2500CC (MISCELLANEOUS) ×3 IMPLANT
CATH KIT ON Q 5IN SLV (PAIN MANAGEMENT) IMPLANT
CATH THORACIC 28FR (CATHETERS) IMPLANT
CATH THORACIC 36FR (CATHETERS) IMPLANT
CATH THORACIC 36FR RT ANG (CATHETERS) IMPLANT
CLEANER TIP ELECTROSURG 2X2 (MISCELLANEOUS) ×3 IMPLANT
CLIP TI MEDIUM 6 (CLIP) IMPLANT
CONN ST 1/4X3/8  BEN (MISCELLANEOUS)
CONN ST 1/4X3/8 BEN (MISCELLANEOUS) IMPLANT
CONN Y 3/8X3/8X3/8  BEN (MISCELLANEOUS)
CONN Y 3/8X3/8X3/8 BEN (MISCELLANEOUS) IMPLANT
CONT SPEC 4OZ CLIKSEAL STRL BL (MISCELLANEOUS) ×6 IMPLANT
COVER SURGICAL LIGHT HANDLE (MISCELLANEOUS) ×3 IMPLANT
COVER TABLE BACK 60X90 (DRAPES) ×3 IMPLANT
DERMABOND ADVANCED (GAUZE/BANDAGES/DRESSINGS) ×1
DERMABOND ADVANCED .7 DNX12 (GAUZE/BANDAGES/DRESSINGS) ×2 IMPLANT
DRAIN CHANNEL 28F RND 3/8 FF (WOUND CARE) IMPLANT
DRAPE LAPAROSCOPIC ABDOMINAL (DRAPES) ×3 IMPLANT
DRAPE WARM FLUID 44X44 (DRAPE) ×3 IMPLANT
DRILL BIT 7/64X5 (BIT) IMPLANT
ELECT BLADE 4.0 EZ CLEAN MEGAD (MISCELLANEOUS) ×3
ELECT REM PT RETURN 9FT ADLT (ELECTROSURGICAL) ×3
ELECTRODE BLDE 4.0 EZ CLN MEGD (MISCELLANEOUS) ×2 IMPLANT
ELECTRODE REM PT RTRN 9FT ADLT (ELECTROSURGICAL) ×2 IMPLANT
FORCEPS BIOP RJ4 1.8 (CUTTING FORCEPS) IMPLANT
GAUZE SPONGE 4X4 12PLY STRL (GAUZE/BANDAGES/DRESSINGS) ×3 IMPLANT
GLOVE BIO SURGEON STRL SZ 6.5 (GLOVE) ×6 IMPLANT
GOWN STRL REUS W/ TWL LRG LVL3 (GOWN DISPOSABLE) ×6 IMPLANT
GOWN STRL REUS W/TWL LRG LVL3 (GOWN DISPOSABLE) ×3
KIT BASIN OR (CUSTOM PROCEDURE TRAY) ×3 IMPLANT
KIT CLEAN ENDO COMPLIANCE (KITS) ×3 IMPLANT
KIT ROOM TURNOVER OR (KITS) ×3 IMPLANT
KIT SUCTION CATH 14FR (SUCTIONS) ×3 IMPLANT
MARKER SKIN DUAL TIP RULER LAB (MISCELLANEOUS) ×3 IMPLANT
NEEDLE BIOPSY TRANSBRONCH 21G (NEEDLE) IMPLANT
NS IRRIG 1000ML POUR BTL (IV SOLUTION) ×12 IMPLANT
OIL SILICONE PENTAX (PARTS (SERVICE/REPAIRS)) ×3 IMPLANT
PACK CHEST (CUSTOM PROCEDURE TRAY) ×3 IMPLANT
PAD ARMBOARD 7.5X6 YLW CONV (MISCELLANEOUS) ×6 IMPLANT
PASSER SUT SWANSON 36MM LOOP (INSTRUMENTS) IMPLANT
SCISSORS LAP 5X35 DISP (ENDOMECHANICALS) IMPLANT
SEALANT PROGEL (MISCELLANEOUS) IMPLANT
SEALANT SURG COSEAL 4ML (VASCULAR PRODUCTS) IMPLANT
SEALANT SURG COSEAL 8ML (VASCULAR PRODUCTS) IMPLANT
SOLUTION ANTI FOG 6CC (MISCELLANEOUS) ×3 IMPLANT
SPONGE GAUZE 4X4 12PLY STER LF (GAUZE/BANDAGES/DRESSINGS) ×3 IMPLANT
SUT PROLENE 3 0 SH DA (SUTURE) IMPLANT
SUT PROLENE 4 0 RB 1 (SUTURE)
SUT PROLENE 4-0 RB1 .5 CRCL 36 (SUTURE) IMPLANT
SUT SILK  1 MH (SUTURE) ×4
SUT SILK 1 MH (SUTURE) ×8 IMPLANT
SUT SILK 1 TIES 10X30 (SUTURE) IMPLANT
SUT SILK 2 0SH CR/8 30 (SUTURE) IMPLANT
SUT SILK 3 0SH CR/8 30 (SUTURE) IMPLANT
SUT VIC AB 1 CTX 18 (SUTURE) ×3 IMPLANT
SUT VIC AB 1 CTX 36 (SUTURE)
SUT VIC AB 1 CTX36XBRD ANBCTR (SUTURE) IMPLANT
SUT VIC AB 2-0 CT1 36 (SUTURE) ×3 IMPLANT
SUT VIC AB 2-0 CTX 36 (SUTURE) IMPLANT
SUT VIC AB 3-0 X1 27 (SUTURE) ×3 IMPLANT
SUT VICRYL 0 UR6 27IN ABS (SUTURE) IMPLANT
SUT VICRYL 2 TP 1 (SUTURE) IMPLANT
SWAB COLLECTION DEVICE MRSA (MISCELLANEOUS) IMPLANT
SYR 20ML ECCENTRIC (SYRINGE) ×3 IMPLANT
SYSTEM SAHARA CHEST DRAIN ATS (WOUND CARE) ×3 IMPLANT
TAPE CLOTH 4X10 WHT NS (GAUZE/BANDAGES/DRESSINGS) ×3 IMPLANT
TAPE CLOTH SURG 4X10 WHT LF (GAUZE/BANDAGES/DRESSINGS) ×3 IMPLANT
TAPE UMBILICAL COTTON 1/8X30 (MISCELLANEOUS) IMPLANT
TIP APPLICATOR SPRAY EXTEND 16 (VASCULAR PRODUCTS) IMPLANT
TOWEL OR 17X24 6PK STRL BLUE (TOWEL DISPOSABLE) ×6 IMPLANT
TOWEL OR 17X26 10 PK STRL BLUE (TOWEL DISPOSABLE) ×6 IMPLANT
TRAP SPECIMEN MUCOUS 40CC (MISCELLANEOUS) ×3 IMPLANT
TRAY FOLEY CATH 14FRSI W/METER (CATHETERS) ×3 IMPLANT
TROCAR XCEL BLUNT TIP 100MML (ENDOMECHANICALS) IMPLANT
TUBE ANAEROBIC SPECIMEN COL (MISCELLANEOUS) IMPLANT
TUBE CONNECTING 20X1/4 (TUBING) ×6 IMPLANT
TUNNELER SHEATH ON-Q 11GX8 DSP (PAIN MANAGEMENT) IMPLANT
WATER STERILE IRR 1000ML POUR (IV SOLUTION) ×6 IMPLANT

## 2014-07-27 NOTE — Anesthesia Preprocedure Evaluation (Addendum)
Anesthesia Evaluation  Patient identified by MRN, date of birth, ID band Patient awake    Reviewed: Allergy & Precautions, NPO status , Patient's Chart, lab work & pertinent test results  Airway Mallampati: II  TM Distance: >3 FB Neck ROM: Full    Dental  (+) Teeth Intact, Dental Advisory Given   Pulmonary pneumonia -, former smoker,    Pulmonary exam normal       Cardiovascular hypertension, Pt. on medications Normal cardiovascular exam    Neuro/Psych    GI/Hepatic GERD-  Medicated and Controlled,  Endo/Other    Renal/GU      Musculoskeletal   Abdominal   Peds  Hematology   Anesthesia Other Findings   Reproductive/Obstetrics                            Anesthesia Physical Anesthesia Plan  ASA: III  Anesthesia Plan: General   Post-op Pain Management:    Induction: Intravenous  Airway Management Planned: Double Lumen EBT  Additional Equipment: Arterial line, CVP and Ultrasound Guidance Line Placement  Intra-op Plan:   Post-operative Plan: Extubation in OR  Informed Consent: I have reviewed the patients History and Physical, chart, labs and discussed the procedure including the risks, benefits and alternatives for the proposed anesthesia with the patient or authorized representative who has indicated his/her understanding and acceptance.     Plan Discussed with: CRNA and Surgeon  Anesthesia Plan Comments:         Anesthesia Quick Evaluation

## 2014-07-27 NOTE — Anesthesia Postprocedure Evaluation (Signed)
Anesthesia Post Note  Patient: Shane Vasquez  Procedure(s) Performed: Procedure(s) (LRB): VIDEO BRONCHOSCOPY (N/A) VIDEO ASSISTED THORACOSCOPY (VATS)/EMPYEMA Left, drainage pleural effusion. (Left)  Anesthesia type: general  Patient location: PACU  Post pain: Pain level controlled  Post assessment: Patient's Cardiovascular Status Stable  Last Vitals:  Filed Vitals:   07/27/14 1800  BP: 118/73  Pulse: 77  Temp:   Resp: 16    Post vital signs: Reviewed and stable  Level of consciousness: sedated  Complications: No apparent anesthesia complications

## 2014-07-27 NOTE — Transfer of Care (Signed)
Immediate Anesthesia Transfer of Care Note  Patient: Shane Vasquez  Procedure(s) Performed: Procedure(s): VIDEO BRONCHOSCOPY (N/A) VIDEO ASSISTED THORACOSCOPY (VATS)/EMPYEMA Left, drainage pleural effusion. (Left)  Patient Location: PACU  Anesthesia Type:General  Level of Consciousness: awake and alert   Airway & Oxygen Therapy: Patient Spontanous Breathing and Patient connected to face mask oxygen  Post-op Assessment: Report given to RN and Post -op Vital signs reviewed and stable  Post vital signs: Reviewed and stable  Last Vitals:  Filed Vitals:   07/27/14 0437  BP: 120/70  Pulse: 77  Temp: 37.2 C  Resp: 16    Complications: No apparent anesthesia complications

## 2014-07-27 NOTE — Progress Notes (Signed)
ANTIBIOTIC CONSULT NOTE - INITIAL  Pharmacy Consult for Vancomycin & Zosyn Indication: pneumonia  Allergies  Allergen Reactions  . Bee Venom Anaphylaxis and Nausea And Vomiting    Patient Measurements: Height: '5\' 10"'$  (177.8 cm) Weight: 162 lb 4.1 oz (73.6 kg) IBW/kg (Calculated) : 73  Vital Signs: Temp: 98.6 F (37 C) (06/11 1050) Temp Source: Oral (06/11 0437) BP: 111/69 mmHg (06/11 1050) Pulse Rate: 80 (06/11 1050) Intake/Output from previous day: 06/10 0701 - 06/11 0700 In: 460 [P.O.:360; IV Piggyback:100] Out: 450 [Urine:450] Intake/Output from this shift: Total I/O In: 1200 [I.V.:1200] Out: 400 [Urine:210; Blood:50; Chest Tube:140]  Labs:  Recent Labs  07/25/14 0515 07/26/14 0550 07/26/14 1851  WBC 20.2* 18.7* 23.2*  HGB 10.7* 10.3* 11.1*  PLT 438* 467* 529*  CREATININE 1.18 1.05 1.02   Estimated Creatinine Clearance: 74.6 mL/min (by C-G formula based on Cr of 1.02). No results for input(s): VANCOTROUGH, VANCOPEAK, VANCORANDOM, GENTTROUGH, GENTPEAK, GENTRANDOM, TOBRATROUGH, TOBRAPEAK, TOBRARND, AMIKACINPEAK, AMIKACINTROU, AMIKACIN in the last 72 hours.   Microbiology: Recent Results (from the past 720 hour(s))  Culture, blood (routine x 2)     Status: None   Collection Time: 07/21/14  4:14 PM  Result Value Ref Range Status   Specimen Description BLOOD LEFT WRIST  Final   Special Requests BOTTLES DRAWN AEROBIC AND ANAEROBIC 5CC  Final   Culture   Final    NO GROWTH 5 DAYS Performed at [facility]    Report Status 07/27/2014 FINAL  Final  Culture, blood (routine x 2)     Status: None   Collection Time: 07/21/14  4:14 PM  Result Value Ref Range Status   Specimen Description BLOOD RIGHT HAND  Final   Special Requests BOTTLES DRAWN AEROBIC AND ANAEROBIC 5CC  Final   Culture   Final    NO GROWTH 5 DAYS Performed at [facility]    Report Status 07/27/2014 FINAL  Final  Culture, sputum-assessment     Status: None   Collection Time:  07/22/14  3:05 AM  Result Value Ref Range Status   Specimen Description SPUTUM  Final   Special Requests Normal  Final   Sputum evaluation   Final    THIS SPECIMEN IS ACCEPTABLE. RESPIRATORY CULTURE REPORT TO FOLLOW.   Report Status 07/22/2014 FINAL  Final  Culture, respiratory (NON-Expectorated)     Status: None   Collection Time: 07/22/14  3:05 AM  Result Value Ref Range Status   Specimen Description SPUTUM  Final   Special Requests NONE  Final   Gram Stain   Final    MODERATE WBC PRESENT,BOTH PMN AND MONONUCLEAR RARE SQUAMOUS EPITHELIAL CELLS PRESENT FEW GRAM NEGATIVE RODS Performed at [facility]    Culture   Final    NORMAL OROPHARYNGEAL FLORA Performed at [facility]    Report Status 07/24/2014 FINAL  Final  Body fluid culture     Status: None (Preliminary result)   Collection Time: 07/26/14  3:11 PM  Result Value Ref Range Status   Specimen Description THORACENTESIS  Final   Special Requests Normal  Final   Gram Stain   Final    FEW WBC PRESENT,BOTH PMN AND MONONUCLEAR NO ORGANISMS SEEN Performed at [facility]    Culture PENDING  Incomplete   Report Status PENDING  Incomplete   Assessment: 63 yo M admitted 07/21/2014  to [facility] with 4d history of fever, transferred to [facility] 6/11 for left VATS for empyema. Pharmacy consulted to dose vancomycin  and zosyn  PMH: GERD, HTN, HLD,   ID:  CAP /Empyema-- 6/10 still spiking fevers to 102; 6/11 VATS now afebrile,  WBC trending up 6/5 CTX 6/5 Azith 6/11 respiratory, bronchal washings 6/10 thoracentesis 6/5 blood x 2 ngtd 6/6 sputum OPF  Goal of Therapy:  Vancomycin trough level 15-20 mcg/ml  Plan:  Zosyn 3.375g IV q8h infuse over 4h Vancomycin 1g IV q12h Follow up SCr, UOP, cultures, clinical course and adjust as clinically indicated.  Thank you for allowing pharmacy to be a part of this patients care team.  Rowe Robert Pharm.D., BCPS, AQ-Cardiology Clinical Pharmacist 07/27/2014 11:58  AM Pager: [phone] Phone: [phone]

## 2014-07-27 NOTE — Progress Notes (Signed)
[facility] PROGRESS NOTE Brief narrative:    64 year old male with history of hypertension, hyperlipidemia and GERD presented to the ED with 4 days history of fever with chills, sweating, a/w nonproductive cough and left lateral chest pain with deep inspiration and coughing. Tmax at home 102.21F.  In the ED patient had significant leukocytosis with WBC of 29 K, chest x-ray and CT angiogram done showed extensive left lower lobe pneumonia with reactive left hilar adenopathy and a small loculated left pleural effusion.       Assessment/Plan: Left lower lobe pneumonia/Community acquired pneumonia/Parapneumonic effusion/Sepsis: - CT chest on 07/26/2014 showed an effusion and loculation, pulmonary was consulted and recommended a VATS procedure. - CT surgery was consulted and performed VATS on 07/27/2014 currently with a chest tube, drain about 260 mL. - Currently on vancomycin Zosyn started on 07/26/2014, appreciate pulmonary's assistance. - monitor Fever curve.  Acute on chronic respiratory failure distress PNA/COPD emphysema: - Secondary to IV fluids and sepsis, received 1 dose of IV Lasix and his weight history and down. No further Lasix needed. - Can measure CVP central line. Continue O2 sats and scheduled meds.  AKI: - Prerenal resolve with IV hydration.  Dyslipidemia - cont meds.  Essential hypertension - resume home medications  Anemia of chronic disease IDA - no signs of active bleeding . - monitor Hbg.    Code Status: Full.  Family Communication: plan of care discussed with the patient Disposition Plan: > 4 days.   Consultants:  PCCM  CT surgery  Procedures: Dg Chest 2 View 07/23/2014 Interval development of CHF and/or fluid overload since the examinations 2 days ago, with stable mild cardiomegaly and moderate diffuse interstitial pulmonary edema. 2. Worsening pneumonia involving the left lower lobe. 3. Bilateral pleural effusions, moderate-sized on  the left and small on the right.   Dg Chest 2 View 07/21/2014 Question COPD changes with mi subsegmental atelectasis RIGHT base. LEFT lower lobe consolidation consistent with pneumonia. Followup PA and lateral chest X-ray is recommended in 3-4 weeks following trial of antibiotic therapy to ensure resolution and exclude underlying malignancy.   Ct Angio Chest Pe W/cm &/or Wo Cm 07/21/2014 Extensive consolidative pneumonia in the left lower lobe with reactive left hilar adenopathy. 2. Small loculated left pleural effusion. 3. Atelectasis at the right lung base. 4. Emphysema.   Ct Chest W Contrast 07/26/2014 Moderate, partially loculated left pleural effusion, greatly increased in size from prior CT. 2. Persistent extensive left lower lobe pneumonia with increased compressive atelectasis from the effusion. 3. New, small right pleural effusion. 4. Basilar right lower lobe atelectasis versus pneumonia.   Antibiotics:  vanc and zosyn 6.10.2016  HPI/Subjective: Complain of pain, but manageable with medications.  Objective: Filed Vitals:   07/26/14 1809 07/26/14 2020 07/26/14 2056 07/27/14 0437  BP: 132/69 108/53  120/70  Pulse: 82 83  77  Temp: 99 F (37.2 C) 98.5 F (36.9 C)  98.9 F (37.2 C)  TempSrc: Oral Oral  Oral  Resp: '20 18  16  '$ Height:      Weight: 71.85 kg (158 lb 6.4 oz) 73.6 kg (162 lb 4.1 oz)    SpO2: 97% 95% 97% 95%    Intake/Output Summary (Last 24 hours) at 07/27/14 1021 Last data filed at 07/27/14 0940  Gross per 24 hour  Intake   1210 ml  Output    660 ml  Net    550 ml   Filed Weights   07/26/14 0607 07/26/14 1809 07/26/14 2020  Weight:  71.94 kg (158 lb 9.6 oz) 71.85 kg (158 lb 6.4 oz) 73.6 kg (162 lb 4.1 oz)    Exam:  General: Alert, awake, oriented x3, in no acute distress.  HEENT: No bruits, no goiter. -JVD Heart: Regular rate and rhythm. Lungs: Good air movement, clear Abdomen: Soft, nontender, nondistended, positive bowel sounds.  Neuro:  Grossly intact, nonfocal.   Data Reviewed: Basic Metabolic Panel:  Recent Labs Lab 07/22/14 0442 07/24/14 1200 07/25/14 0515 07/26/14 0550 07/26/14 1851  NA 139 136 136 133* 135  K 3.3* 3.4* 2.8* 3.5 3.5  CL 108 102 94* 100* 98*  CO2 21* '25 30 26 27  '$ GLUCOSE 131* 106* 129* 114* 113*  BUN '15 17 20 18 13  '$ CREATININE 1.07 1.14 1.18 1.05 1.02  CALCIUM 8.0* 8.0* 8.1* 7.8* 8.2*   Liver Function Tests:  Recent Labs Lab 07/21/14 1134 07/26/14 1510 07/26/14 1851  AST 23  --  45*  ALT 25  --  42  ALKPHOS 89  --  105  BILITOT 0.3  --  0.4  PROT 8.0 6.4* 6.3*  ALBUMIN 3.2*  --  2.0*   No results for input(s): LIPASE, AMYLASE in the last 168 hours. No results for input(s): AMMONIA in the last 168 hours. CBC:  Recent Labs Lab 07/23/14 0415 07/24/14 0520 07/25/14 0515 07/26/14 0550 07/26/14 1851  WBC 17.3* 19.5* 20.2* 18.7* 23.2*  HGB 9.2* 10.4* 10.7* 10.3* 11.1*  HCT 27.6* 30.6* 31.0* 30.0* 31.6*  MCV 96.2 95.0 92.8 93.8 92.4  PLT 347 403* 438* 467* 529*   Cardiac Enzymes:  Recent Labs Lab 07/21/14 1217  CKTOTAL 36*   BNP (last 3 results) No results for input(s): BNP in the last 8760 hours.  ProBNP (last 3 results) No results for input(s): PROBNP in the last 8760 hours.  CBG: No results for input(s): GLUCAP in the last 168 hours.  Recent Results (from the past 240 hour(s))  Culture, blood (routine x 2)     Status: None   Collection Time: 07/21/14  4:14 PM  Result Value Ref Range Status   Specimen Description BLOOD LEFT WRIST  Final   Special Requests BOTTLES DRAWN AEROBIC AND ANAEROBIC 5CC  Final   Culture   Final    NO GROWTH 5 DAYS Performed at [facility]    Report Status 07/27/2014 FINAL  Final  Culture, blood (routine x 2)     Status: None   Collection Time: 07/21/14  4:14 PM  Result Value Ref Range Status   Specimen Description BLOOD RIGHT HAND  Final   Special Requests BOTTLES DRAWN AEROBIC AND ANAEROBIC 5CC  Final   Culture    Final    NO GROWTH 5 DAYS Performed at [facility]    Report Status 07/27/2014 FINAL  Final  Culture, sputum-assessment     Status: None   Collection Time: 07/22/14  3:05 AM  Result Value Ref Range Status   Specimen Description SPUTUM  Final   Special Requests Normal  Final   Sputum evaluation   Final    THIS SPECIMEN IS ACCEPTABLE. RESPIRATORY CULTURE REPORT TO FOLLOW.   Report Status 07/22/2014 FINAL  Final  Culture, respiratory (NON-Expectorated)     Status: None   Collection Time: 07/22/14  3:05 AM  Result Value Ref Range Status   Specimen Description SPUTUM  Final   Special Requests NONE  Final   Gram Stain   Final    MODERATE WBC PRESENT,BOTH PMN AND MONONUCLEAR RARE SQUAMOUS  EPITHELIAL CELLS PRESENT FEW GRAM NEGATIVE RODS Performed at [facility]    Culture   Final    NORMAL OROPHARYNGEAL FLORA Performed at [facility]    Report Status 07/24/2014 FINAL  Final  Body fluid culture     Status: None (Preliminary result)   Collection Time: 07/26/14  3:11 PM  Result Value Ref Range Status   Specimen Description THORACENTESIS  Final   Special Requests Normal  Final   Gram Stain   Final    FEW WBC PRESENT,BOTH PMN AND MONONUCLEAR NO ORGANISMS SEEN Performed at [facility]    Culture PENDING  Incomplete   Report Status PENDING  Incomplete     Studies: Ct Chest W Contrast  07/26/2014   CLINICAL DATA:  Pneumonia, cough, and shortness of breath.  EXAM: CT CHEST WITH CONTRAST  TECHNIQUE: Multidetector CT imaging of the chest was performed during intravenous contrast administration.  CONTRAST:  81m OMNIPAQUE IOHEXOL 300 MG/ML  SOLN  COMPARISON:  07/21/2014 chest CT  FINDINGS: Small mediastinal lymph nodes are similar to the prior study, most likely reactive. Heart size is within normal limits. There is no pericardial effusion.  There is a new, small right pleural effusion. Centrilobular emphysema is again seen. Minimal scarring or  subsegmental atelectasis is noted in the right middle lobe, improved from prior. There is also linear scarring or subsegmental atelectasis laterally in the right lower lobe. There is a more confluent region of opacity involving the basilar right lower lobe with some air bronchograms.  Left pleural effusion has increased in size, now moderate. The effusion appears homogeneous and low in density with at most minimal pleural thickening posteriorly. The effusion is partially loculated, medially more so than laterally near the lung apex. A small amount of fluid is also noted in the left major fissure. There is increased confluent opacity involving the majority of the left lower lobe which represents a combination of increased compressive atelectasis from the pleural effusion as well as persistent airspace filling due to the previously described pneumonia. No frank left lower lobe cavitation or abscess is identified. Mild atelectasis is present in the lingula.  Small hiatal hernia is less conspicuous than on the prior CT. No acute osseous abnormality is identified.  IMPRESSION: 1. Moderate, partially loculated left pleural effusion, greatly increased in size from prior CT. 2. Persistent extensive left lower lobe pneumonia with increased compressive atelectasis from the effusion. 3. New, small right pleural effusion. 4. Basilar right lower lobe atelectasis versus pneumonia.   Electronically Signed   By: ALogan Bores  On: 07/26/2014 12:42   Dg Chest Port 1 View  07/26/2014   CLINICAL DATA:  LEFT thoracentesis.  Evaluate for pneumothorax.  EXAM: PORTABLE CHEST - 1 VIEW  COMPARISON:  CT 07/26/2014.  Chest x-ray 07/23/2014.  FINDINGS: Interval decrease in the LEFT pleural effusion. No pneumothorax. Mildly improved aeration of the LEFT lung. Persistent consolidation of the LEFT lung base. Increased density at the RIGHT lung base likely representing atelectasis. Cardiopericardial silhouette is unchanged, enlarged.   IMPRESSION: No pneumothorax status post LEFT thoracentesis. Mildly improved aeration of the LEFT lung.   Electronically Signed   By: GDereck LigasM.D.   On: 07/26/2014 15:18    Scheduled Meds: . [START ON 07/28/2014] azithromycin  500 mg Oral Q24H  . [MAR Hold] cefTRIAXone (ROCEPHIN)  IV  1 g Intravenous Q24H  . [MAR Hold] citalopram  20 mg Oral QHS  . [MAR Hold] enoxaparin (LOVENOX) injection  40 mg Subcutaneous Q24H  . fentaNYL   Intravenous 6 times per day  . [MAR Hold] guaiFENesin  600 mg Oral BID  . [MAR Hold] hydrochlorothiazide  25 mg Oral Daily  . [MAR Hold] ipratropium-albuterol  3 mL Nebulization QID  . [MAR Hold]  morphine injection  2 mg Intravenous Once  . [MAR Hold] pantoprazole  40 mg Oral Daily  . [START ON 07/28/2014] pneumococcal 23 valent vaccine  0.5 mL Intramuscular Tomorrow-1000   Continuous Infusions: . sodium chloride 10 mL/hr at 07/23/14 1516    Time Spent: 25 min   Charlynne Cousins  [facility] Pager [phone]. If 7PM-7AM, please contact night-coverage at [email or web address], password [email or web address] 07/27/2014, 10:21 AM  LOS: 6 days

## 2014-07-27 NOTE — Progress Notes (Signed)
Name: Shane Vasquez MRN: [id number] DOB: 1951/01/07    ADMISSION DATE:  07/21/2014 CONSULTATION DATE: 6/11  REFERRING MD :  Doyle Askew  CHIEF COMPLAINT:  Pleural effusion   BRIEF PATIENT DESCRIPTION:  64 year old male admitted 6/2 w/ CAP (NOS). On admission had very small loculated left effusion. He remained febrile and still has sig leukocytosis in spite of 6 days CAP coverage. Because of this repeat CT imaging was obtained which showed increased size of left loculated effusion. Because of this PCCM was called for consult.    SIGNIFICANT EVENTS  6/10 left thora 425 cc 6/11 left vats  STUDIES:  CT chest 6/10: 1. Moderate, partially loculated left pleural effusion, greatly increased in size from prior CT. 2. Persistent extensive left lower lobe pneumonia with increased compressive atelectasis from the effusion.3. New, small right pleural effusion. 4. Basilar right lower lobe atelectasis versus pneumonia.    HISTORY OF PRESENT ILLNESS:   64 year old male with history of GERD, hypertension and hyperlipidemia who presented to the ED on 6/5 with 4 day history of fever with chills and sweating. He reported nonproductive cough with left lateral and posterior chest pain with movement and deep inspiration and coughing. He reports having frequent fever for the past 4 days with maximum temperature of 102.32F which would response to Tylenol. Reported poor appetite. Patient denied headache, dizziness,nausea , vomiting, malaise, palpitations, SOB, abdominal pain, bowel or urinary symptoms. Denied change in weight . Denied recent travel or sick contact. CXR showed what looked like LLL PNA and he was admitted w/ working dx of CAP w/ small loculated effusion. He was treated w/ empiric CAP coverage. All culture data was negative, U strep was negative. In spite of this treatment he continued to have significant leukocytosis and spiking fever even after 6 days of antibiotics. A repeat CT chest was obtained which  showed increased loculated left effusion and because of this PCCM was called.      SUBJECTIVE:  Sleepy post op VITAL SIGNS: Temp:  [98.2 F (36.8 C)-99 F (37.2 C)] 98.6 F (37 C) (06/11 1050) Pulse Rate:  [77-94] 80 (06/11 1050) Resp:  [15-20] 15 (06/11 1052) BP: (95-132)/(53-72) 111/69 mmHg (06/11 1050) SpO2:  [94 %-97 %] 96 % (06/11 1052) Arterial Line BP: (96-102)/(43-50) 102/43 mmHg (06/11 1050) Weight:  [158 lb 6.4 oz (71.85 kg)-162 lb 4.1 oz (73.6 kg)] 162 lb 4.1 oz (73.6 kg) (06/10 2020)  PHYSICAL EXAMINATION: General:  Well developed 64 year old male, post op 6/11 Neuro:  Awake and alert, no focal def , sleepy post op HEENT:  [place], no JVD . Rt i j cvl Cardiovascular:  rrr Lungs:  Decreased bs left, 2 28 fr ct's to water seal/suction, no air leak. Dressing intact Abdomen:  Soft, non-tender + bowel sounds  Musculoskeletal:  Intact  Skin:  Intact    Recent Labs Lab 07/25/14 0515 07/26/14 0550 07/26/14 1851  NA 136 133* 135  K 2.8* 3.5 3.5  CL 94* 100* 98*  CO2 '30 26 27  '$ BUN '20 18 13  '$ CREATININE 1.18 1.05 1.02  GLUCOSE 129* 114* 113*    Recent Labs Lab 07/25/14 0515 07/26/14 0550 07/26/14 1851  HGB 10.7* 10.3* 11.1*  HCT 31.0* 30.0* 31.6*  WBC 20.2* 18.7* 23.2*  PLT 438* 467* 529*   Ct Chest W Contrast  07/26/2014   CLINICAL DATA:  Pneumonia, cough, and shortness of breath.  EXAM: CT CHEST WITH CONTRAST  TECHNIQUE: Multidetector CT imaging of the chest was  performed during intravenous contrast administration.  CONTRAST:  62m OMNIPAQUE IOHEXOL 300 MG/ML  SOLN  COMPARISON:  07/21/2014 chest CT  FINDINGS: Small mediastinal lymph nodes are similar to the prior study, most likely reactive. Heart size is within normal limits. There is no pericardial effusion.  There is a new, small right pleural effusion. Centrilobular emphysema is again seen. Minimal scarring or subsegmental atelectasis is noted in the right middle lobe, improved from prior. There is also linear  scarring or subsegmental atelectasis laterally in the right lower lobe. There is a more confluent region of opacity involving the basilar right lower lobe with some air bronchograms.  Left pleural effusion has increased in size, now moderate. The effusion appears homogeneous and low in density with at most minimal pleural thickening posteriorly. The effusion is partially loculated, medially more so than laterally near the lung apex. A small amount of fluid is also noted in the left major fissure. There is increased confluent opacity involving the majority of the left lower lobe which represents a combination of increased compressive atelectasis from the pleural effusion as well as persistent airspace filling due to the previously described pneumonia. No frank left lower lobe cavitation or abscess is identified. Mild atelectasis is present in the lingula.  Small hiatal hernia is less conspicuous than on the prior CT. No acute osseous abnormality is identified.  IMPRESSION: 1. Moderate, partially loculated left pleural effusion, greatly increased in size from prior CT. 2. Persistent extensive left lower lobe pneumonia with increased compressive atelectasis from the effusion. 3. New, small right pleural effusion. 4. Basilar right lower lobe atelectasis versus pneumonia.   Electronically Signed   By: ALogan Bores  On: 07/26/2014 12:42   Dg Chest Port 1 View  07/27/2014   CLINICAL DATA:  Central line and chest tube placement  EXAM: PORTABLE CHEST - 1 VIEW  COMPARISON:  CT and radiographs earlier the same date.  FINDINGS: 1031 hours. New right IJ central venous catheter extends to the lower SVC level. Left chest tube has been placed with partial evacuation of the left pleural effusion. There is a small left apical pneumothorax. No pneumothorax is present on the right. Overall left base aeration has improved with mild left-greater-than-right basilar airspace opacities. The heart size and mediastinal contours are  stable.  IMPRESSION: 1. Central line placement as described. No right-sided pneumothorax. 2. Decreased left pleural effusion following left chest tube placement. Small left apical pneumothorax.   Electronically Signed   By: WRichardean SaleM.D.   On: 07/27/2014 11:03   Dg Chest Port 1 View  07/26/2014   CLINICAL DATA:  LEFT thoracentesis.  Evaluate for pneumothorax.  EXAM: PORTABLE CHEST - 1 VIEW  COMPARISON:  CT 07/26/2014.  Chest x-ray 07/23/2014.  FINDINGS: Interval decrease in the LEFT pleural effusion. No pneumothorax. Mildly improved aeration of the LEFT lung. Persistent consolidation of the LEFT lung base. Increased density at the RIGHT lung base likely representing atelectasis. Cardiopericardial silhouette is unchanged, enlarged.  IMPRESSION: No pneumothorax status post LEFT thoracentesis. Mildly improved aeration of the LEFT lung.   Electronically Signed   By: GDereck LigasM.D.   On: 07/26/2014 15:18  mod/marked left loculated effusion   ASSESSMENT / PLAN: Acute hypoxic respiratory failure in setting of CAP (NOS) and Loculated left and small right effusion  Pleuritic type chest pain  6/10 left thoracentesis 6/11 left vats and peel  HTN  Discussion Bedside [place] thoracentesis yielded ~425 concentrated yellow pleural fluid. Unfortunately still has sig  fluid collection seen via [place], therefore taken to OR 6/11  Plan Post thoracentesis left 6/10 with incomplete drainage. CVTS consulted and Left Vats performed 6/11. Returned to ICU extubated and HD stable. Pain control Expand abx to Vanc/Zoysn till culture data mature. Vats care per CVTS When he goes to floor he can go back to triad service Antihypertensives as needed, hold 6/11  [facility] ACNP Maryanna Shape PCCM Pager [phone] till 3 pm If no answer page [phone] 07/27/2014, 82:43 AM  64 year old male with empyema, s/p VATS.  I reviewed CXR myself, evidence of left sided effusion preop with CT on today's CXR.  Pain under control.   Discussed with RN and PCCM-NP.  Empyema:  - D/C rocephin/zithromax.  - Start vanc/zosyn.  - F/u on cultures.  Post op pain:  - PCA in place.  Hypotension: likely related to narcs but can not r/o septic infection.  - IVF resuscitation as ordered.  - Hold off pressors for now.  - Limit narcs as able.  Pleuritic chest pain:  - See above.  Patient seen and examined, agree with above note.  I dictated the care and orders written for this patient under my direction.  Rush Farmer, MD [phone]

## 2014-07-27 NOTE — Progress Notes (Signed)
Patient ID: Shane Vasquez, male   DOB: 08-04-1950, 64 y.o.   MRN: [id number] EVENING ROUNDS NOTE :     [address]       [place] [address]             [phone]                 Day of Surgery Procedure(s) (LRB): VIDEO BRONCHOSCOPY (N/A) VIDEO ASSISTED THORACOSCOPY (VATS)/EMPYEMA Left, drainage pleural effusion. (Left)  Total Length of Stay:  LOS: 6 days  BP 99/60 mmHg  Pulse 67  Temp(Src) 98.1 F (36.7 C) (Oral)  Resp 18  Ht '5\' 10"'$  (1.778 m)  Wt 162 lb 4.1 oz (73.6 kg)  BMI 23.28 kg/m2  SpO2 96%  .Intake/Output      06/11 0701 - 06/12 0700   P.O. 1080   I.V. (mL/kg) 1875 (25.5)   IV Piggyback 250   Total Intake(mL/kg) 3205 (43.5)   Urine (mL/kg/hr) 510 (0.5)   Stool    Blood 50 (0.1)   Chest Tube 320 (0.3)   Total Output 880   Net +2325         . dextrose 5 % and 0.45 % NaCl with KCl 20 mEq/L 100 mL/hr at 07/27/14 2000     Lab Results  Component Value Date   WBC 23.2* 07/26/2014   HGB 11.1* 07/26/2014   HCT 31.6* 07/26/2014   PLT 529* 07/26/2014   GLUCOSE 113* 07/26/2014   CHOL 335* 02/13/2014   TRIG 140.0 02/13/2014   HDL 68.30 02/13/2014   LDLDIRECT 205.7 11/23/2011   LDLCALC 239* 02/13/2014   ALT 42 07/26/2014   AST 45* 07/26/2014   NA 135 07/26/2014   K 3.5 07/26/2014   CL 98* 07/26/2014   CREATININE 1.02 07/26/2014   BUN 13 07/26/2014   CO2 27 07/26/2014   TSH 1.36 02/13/2014   PSA 0.71 11/23/2011   INR 1.19 07/26/2014   Stable in ICU good respiratory effort  [name] MD  Beeper [phone] Office [phone] 07/27/2014 8:33 PM

## 2014-07-27 NOTE — Anesthesia Procedure Notes (Addendum)
Procedure Name: Intubation Date/Time: 07/27/2014 7:55 AM Performed by: Suzy Bouchard Pre-anesthesia Checklist: Patient identified, Timeout performed, Emergency Drugs available, Suction available and Patient being monitored Patient Re-evaluated:Patient Re-evaluated prior to inductionOxygen Delivery Method: Circle system utilized Preoxygenation: Pre-oxygenation with 100% oxygen Intubation Type: IV induction Ventilation: Mask ventilation without difficulty Laryngoscope Size: Mac and 4 Grade View: Grade III Tube type: Oral Tube size: 8.0 mm Number of attempts: 2 Airway Equipment and Method: Stylet Placement Confirmation: ETT inserted through vocal cords under direct vision,  breath sounds checked- equal and bilateral and positive ETCO2 Secured at: 21 cm Tube secured with: Tape Dental Injury: Teeth and Oropharynx as per pre-operative assessment  Difficulty Due To: Difficult Airway- due to anterior larynx

## 2014-07-27 NOTE — Brief Op Note (Addendum)
      [address]       [place] [address]             [phone]         07/27/2014  9:38 AM  PATIENT:  Shane Vasquez  64 y.o. male  PRE-OPERATIVE DIAGNOSIS:  LEFT EMPYEMA   POST-OPERATIVE DIAGNOSIS:  LEFT EMPYEMA  PROCEDURE:  VIDEO BRONCHOSCOPY LEFT VIDEO ASSISTED THORACOSCOPY   Drainage of empyema  Decortication   SURGEON:  Surgeon(s): [name], MD  ASSISTANT: Suzzanne Cloud, PA-C  ANESTHESIA:   general  SPECIMEN:  Source of Specimen:  pleural fluid, pleural peel  DISPOSITION OF SPECIMEN:  Pathology  DRAINS: 2 28 Blake drains  PATIENT CONDITION:  PACU - hemodynamically stable.

## 2014-07-28 ENCOUNTER — Inpatient Hospital Stay (HOSPITAL_COMMUNITY): Payer: Medicare Other

## 2014-07-28 DIAGNOSIS — J9621 Acute and chronic respiratory failure with hypoxia: Secondary | ICD-10-CM

## 2014-07-28 DIAGNOSIS — J869 Pyothorax without fistula: Secondary | ICD-10-CM

## 2014-07-28 LAB — BASIC METABOLIC PANEL
Anion gap: 6 (ref 5–15)
BUN: 12 mg/dL (ref 6–20)
CALCIUM: 7.3 mg/dL — AB (ref 8.9–10.3)
CO2: 26 mmol/L (ref 22–32)
Chloride: 99 mmol/L — ABNORMAL LOW (ref 101–111)
Creatinine, Ser: 0.97 mg/dL (ref 0.61–1.24)
GFR calc non Af Amer: >60 mL/min (ref >60)
GLUCOSE: 136 mg/dL — AB (ref 65–99)
POTASSIUM: 4 mmol/L (ref 3.5–5.1)
Sodium: 131 mmol/L — ABNORMAL LOW (ref 135–145)

## 2014-07-28 LAB — CBC
HEMATOCRIT: 25.4 % — AB (ref 39.0–52.0)
Hemoglobin: 8.7 g/dL — ABNORMAL LOW (ref 13.0–17.0)
MCH: 32.1 pg (ref 26.0–34.0)
MCHC: 34.3 g/dL (ref 30.0–36.0)
MCV: 93.7 fL (ref 78.0–100.0)
PLATELETS: 520 10*3/uL — AB (ref 150–400)
RBC: 2.71 MIL/uL — AB (ref 4.22–5.81)
RDW: 14.1 % (ref 11.5–15.5)
WBC: 23 10*3/uL — ABNORMAL HIGH (ref 4.0–10.5)

## 2014-07-28 LAB — POCT I-STAT 3, ART BLOOD GAS (G3+)
Bicarbonate: 26 mEq/L — ABNORMAL HIGH (ref 20.0–24.0)
O2 Saturation: 92 %
Patient temperature: 98.7
TCO2: 27 mmol/L (ref 0–100)
pCO2 arterial: 45.9 mmHg — ABNORMAL HIGH (ref 35.0–45.0)
pH, Arterial: 7.361 (ref 7.350–7.450)
pO2, Arterial: 68 mmHg — ABNORMAL LOW (ref 80.0–100.0)

## 2014-07-28 LAB — GRAM STAIN: Gram Stain: NONE SEEN

## 2014-07-28 MED ORDER — FENTANYL CITRATE (PF) 100 MCG/2ML IJ SOLN
25.0000 ug | INTRAMUSCULAR | Status: DC | PRN
  Administered 2014-07-28: 50 ug via INTRAVENOUS
  Filled 2014-07-28: qty 2

## 2014-07-28 NOTE — Progress Notes (Addendum)
Name: Shane Vasquez MRN: [id number] DOB: 25-Apr-1950    ADMISSION DATE:  07/21/2014 CONSULTATION DATE: 6/11  REFERRING MD :  Doyle Askew  CHIEF COMPLAINT:  Pleural effusion   BRIEF PATIENT DESCRIPTION:  64 year old male admitted 6/2 w/ CAP (NOS). On admission had very small loculated left effusion. He remained febrile and still has sig leukocytosis in spite of 6 days CAP coverage. Because of this repeat CT imaging was obtained which showed increased size of left loculated effusion. Because of this PCCM was called for consult.    SIGNIFICANT EVENTS  6/10 left thora 425 cc 6/11 left vats  STUDIES:  CT chest 6/10: 1. Moderate, partially loculated left pleural effusion, greatly increased in size from prior CT. 2. Persistent extensive left lower lobe pneumonia with increased compressive atelectasis from the effusion.3. New, small right pleural effusion. 4. Basilar right lower lobe atelectasis versus pneumonia.    HISTORY OF PRESENT ILLNESS:   64 year old male with history of GERD, hypertension and hyperlipidemia who presented to the ED on 6/5 with 4 day history of fever with chills and sweating. He reported nonproductive cough with left lateral and posterior chest pain with movement and deep inspiration and coughing. He reports having frequent fever for the past 4 days with maximum temperature of 102.12F which would response to Tylenol. Reported poor appetite. Patient denied headache, dizziness,nausea , vomiting, malaise, palpitations, SOB, abdominal pain, bowel or urinary symptoms. Denied change in weight . Denied recent travel or sick contact. CXR showed what looked like LLL PNA and he was admitted w/ working dx of CAP w/ small loculated effusion. He was treated w/ empiric CAP coverage. All culture data was negative, U strep was negative. In spite of this treatment he continued to have significant leukocytosis and spiking fever even after 6 days of antibiotics. A repeat CT chest was obtained which  showed increased loculated left effusion and because of this PCCM was called.      SUBJECTIVE:  Sleepy post op VITAL SIGNS: Temp:  [98.1 F (36.7 C)-98.7 F (37.1 C)] 98.3 F (36.8 C) (06/12 0757) Pulse Rate:  [65-86] 80 (06/12 0800) Resp:  [11-22] 16 (06/12 0800) BP: (87-128)/(49-75) 96/59 mmHg (06/12 0800) SpO2:  [90 %-96 %] 94 % (06/12 0800) Arterial Line BP: (83-121)/(35-60) 83/56 mmHg (06/12 0800) Weight:  [73.6 kg (162 lb 4.1 oz)] 73.6 kg (162 lb 4.1 oz) (06/11 1200)  PHYSICAL EXAMINATION: General:  Well developed 64 year old male, post op 6/11 Neuro:  Awake and alert, no focal def , sleepy post op HEENT:  [place], no JVD . Rt i j cvl Cardiovascular:  rrr Lungs:  Decreased bs left, 2 28 fr ct's to water seal/suction, no air leak. Dressing intact Abdomen:  Soft, non-tender + bowel sounds  Musculoskeletal:  Intact  Skin:  Intact    Recent Labs Lab 07/26/14 0550 07/26/14 1851 07/28/14 0400  NA 133* 135 131*  K 3.5 3.5 4.0  CL 100* 98* 99*  CO2 '26 27 26  '$ BUN '18 13 12  '$ CREATININE 1.05 1.02 0.97  GLUCOSE 114* 113* 136*    Recent Labs Lab 07/26/14 0550 07/26/14 1851 07/28/14 0400  HGB 10.3* 11.1* 8.7*  HCT 30.0* 31.6* 25.4*  WBC 18.7* 23.2* 23.0*  PLT 467* 529* 520*   Ct Chest W Contrast  07/26/2014   CLINICAL DATA:  Pneumonia, cough, and shortness of breath.  EXAM: CT CHEST WITH CONTRAST  TECHNIQUE: Multidetector CT imaging of the chest was performed during intravenous contrast administration.  CONTRAST:  8m OMNIPAQUE IOHEXOL 300 MG/ML  SOLN  COMPARISON:  07/21/2014 chest CT  FINDINGS: Small mediastinal lymph nodes are similar to the prior study, most likely reactive. Heart size is within normal limits. There is no pericardial effusion.  There is a new, small right pleural effusion. Centrilobular emphysema is again seen. Minimal scarring or subsegmental atelectasis is noted in the right middle lobe, improved from prior. There is also linear scarring or subsegmental  atelectasis laterally in the right lower lobe. There is a more confluent region of opacity involving the basilar right lower lobe with some air bronchograms.  Left pleural effusion has increased in size, now moderate. The effusion appears homogeneous and low in density with at most minimal pleural thickening posteriorly. The effusion is partially loculated, medially more so than laterally near the lung apex. A small amount of fluid is also noted in the left major fissure. There is increased confluent opacity involving the majority of the left lower lobe which represents a combination of increased compressive atelectasis from the pleural effusion as well as persistent airspace filling due to the previously described pneumonia. No frank left lower lobe cavitation or abscess is identified. Mild atelectasis is present in the lingula.  Small hiatal hernia is less conspicuous than on the prior CT. No acute osseous abnormality is identified.  IMPRESSION: 1. Moderate, partially loculated left pleural effusion, greatly increased in size from prior CT. 2. Persistent extensive left lower lobe pneumonia with increased compressive atelectasis from the effusion. 3. New, small right pleural effusion. 4. Basilar right lower lobe atelectasis versus pneumonia.   Electronically Signed   By: ALogan Bores  On: 07/26/2014 12:42   Dg Chest Port 1 View  07/28/2014   CLINICAL DATA:  Respiratory failure. Hyperlipidemia and hypertension.  EXAM: PORTABLE CHEST - 1 VIEW  COMPARISON:  1 day prior  FINDINGS: Right internal jugular line is unchanged, tip at low SVC. Left-sided chest tube or chest tubes unchanged in position. The previously described left apical pneumothorax has resolved. Midline trachea. Cardiomegaly accentuated by AP portable technique. Interstitial prominence is greater on the left and minimally increased. Small left pleural effusion is decreased. Improved left worse than right base airspace disease.  IMPRESSION: Resolution  of left-sided pneumothorax.  Progressive interstitial edema.  Slight improvement in left worse than right base airspace disease with decreased small left pleural effusion.   Electronically Signed   By: KAbigail MiyamotoM.D.   On: 07/28/2014 08:28   Dg Chest Port 1 View  07/27/2014   CLINICAL DATA:  Central line and chest tube placement  EXAM: PORTABLE CHEST - 1 VIEW  COMPARISON:  CT and radiographs earlier the same date.  FINDINGS: 1031 hours. New right IJ central venous catheter extends to the lower SVC level. Left chest tube has been placed with partial evacuation of the left pleural effusion. There is a small left apical pneumothorax. No pneumothorax is present on the right. Overall left base aeration has improved with mild left-greater-than-right basilar airspace opacities. The heart size and mediastinal contours are stable.  IMPRESSION: 1. Central line placement as described. No right-sided pneumothorax. 2. Decreased left pleural effusion following left chest tube placement. Small left apical pneumothorax.   Electronically Signed   By: WRichardean SaleM.D.   On: 07/27/2014 11:03   Dg Chest Port 1 View  07/26/2014   CLINICAL DATA:  LEFT thoracentesis.  Evaluate for pneumothorax.  EXAM: PORTABLE CHEST - 1 VIEW  COMPARISON:  CT 07/26/2014.  Chest x-ray  07/23/2014.  FINDINGS: Interval decrease in the LEFT pleural effusion. No pneumothorax. Mildly improved aeration of the LEFT lung. Persistent consolidation of the LEFT lung base. Increased density at the RIGHT lung base likely representing atelectasis. Cardiopericardial silhouette is unchanged, enlarged.  IMPRESSION: No pneumothorax status post LEFT thoracentesis. Mildly improved aeration of the LEFT lung.   Electronically Signed   By: Dereck Ligas M.D.   On: 07/26/2014 15:18  mod/marked left loculated effusion   ASSESSMENT / PLAN: Acute hypoxic respiratory failure in setting of CAP (NOS) and Loculated left and small right effusion  Pleuritic type chest  pain  6/10 left thoracentesis 6/11 left vats and peel  HTN  64 year old male with empyema, s/p VATS.  I reviewed CXR myself, evidence of left sided effusion preop with CT on today's CXR.  Pain under control.  Discussed with RN and Dr. Pia Mau.  Empyema:  - D/Ced rocephin/zithromax.  - Continue vanc/zosyn.  - Called lab for cultures, thora culture noted but not from OR yet.  Post op pain:  - D/C PCA.  - PRN fentanyl ordered.  Hypotension: likely related to narcs.  - KVO IVF  - Hold off pressors for now.  - D/C PCA.  - Hold home anti-HTN.  - PRN fentanyl.  - D/C a-line.  Pleuritic chest pain:  - D/C PCA.  - PRN fentanyl as ordered.  Hypoxemia: due to PNA and shallow breathing.  - Explained to patient how to use IS.  - Titrate O2 for sat of 88-92%.  - Mobilize.  Transfer to 3S or 2W pending bed availability.  Continue CT to suction.  Broad spectrum abx for now.  F/u on culture as available.  Transfer care to [facility].  Rush Farmer, M.D. [facility] Pulmonary/Critical Care Medicine. Pager: [phone]. After hours pager: [phone].

## 2014-07-28 NOTE — Progress Notes (Signed)
Patient arrived to unit and immediately had to go to restroom.  Patient alert and oriented. RN waiting in room and will continue to monitor on transport monitor. Payton Emerald, RN

## 2014-07-28 NOTE — Progress Notes (Signed)
Patient ID: Shane Vasquez, male   DOB: 1950-03-21, 64 y.o.   MRN: [id number] T[facility] DAILY ICU PROGRESS NOTE                   [address]            [place] [address]          [phone]   1 Day Post-Op Procedure(s) (LRB): VIDEO BRONCHOSCOPY (N/A) VIDEO ASSISTED THORACOSCOPY (VATS)/EMPYEMA Left, drainage pleural effusion. (Left)  Total Length of Stay:  LOS: 7 days   Subjective: Feels better today good respiratory effort  Objective: Vital signs in last 24 hours: Temp:  [98.1 F (36.7 C)-98.7 F (37.1 C)] 98.3 F (36.8 C) (06/12 0757) Pulse Rate:  [65-86] 80 (06/12 0800) Cardiac Rhythm:  [-] Normal sinus rhythm (06/12 0800) Resp:  [11-22] 16 (06/12 0800) BP: (87-128)/(49-75) 96/59 mmHg (06/12 0800) SpO2:  [90 %-96 %] 94 % (06/12 0800) Arterial Line BP: (83-121)/(35-60) 83/56 mmHg (06/12 0800) Weight:  [162 lb 4.1 oz (73.6 kg)] 162 lb 4.1 oz (73.6 kg) (06/11 1200)  Filed Weights   07/26/14 1809 07/26/14 2020 07/27/14 1200  Weight: 158 lb 6.4 oz (71.85 kg) 162 lb 4.1 oz (73.6 kg) 162 lb 4.1 oz (73.6 kg)    Weight change: 3 lb 13.7 oz (1.75 kg)   Hemodynamic parameters for last 24 hours:    Intake/Output from previous day: 06/11 0701 - 06/12 0700 In: 5325 [P.O.:1800; I.V.:2975; IV Piggyback:550] Out: 1990 [YBOFB:5102; Blood:50; Chest Tube:370]  Intake/Output this shift: Total I/O In: 340 [P.O.:240; I.V.:100] Out: 275 [Urine:275]  Current Meds: Scheduled Meds: . acetaminophen  1,000 mg Oral 4 times per day   Or  . acetaminophen (TYLENOL) oral liquid 160 mg/5 mL  1,000 mg Oral 4 times per day  . bisacodyl  10 mg Oral Daily  . citalopram  20 mg Oral QHS  . enoxaparin (LOVENOX) injection  40 mg Subcutaneous Q24H  . guaiFENesin  600 mg Oral BID  . hydrochlorothiazide  25 mg Oral Daily  . piperacillin-tazobactam (ZOSYN)  IV  3.375 g Intravenous 3 times per day  . senna-docusate  1 tablet Oral QHS  . vancomycin  1,000 mg Intravenous Q12H    Continuous Infusions:  PRN Meds:.diphenhydrAMINE **OR** diphenhydrAMINE, fentaNYL (SUBLIMAZE) injection, naloxone **AND** sodium chloride, ondansetron (ZOFRAN) IV, oxyCODONE, potassium chloride, traMADol  General appearance: alert, cooperative and no distress Neurologic: intact Heart: regular rate and rhythm, S1, S2 normal, no murmur, click, rub or gallop Lungs: diminished breath sounds LLL Abdomen: soft, non-tender; bowel sounds normal; no masses,  no organomegaly Extremities: extremities normal, atraumatic, no cyanosis or edema and Homans sign is negative, no sign of DVT Wound: very small air leak  Lab Results: CBC: Recent Labs  07/26/14 1851 07/28/14 0400  WBC 23.2* 23.0*  HGB 11.1* 8.7*  HCT 31.6* 25.4*  PLT 529* 520*   BMET:  Recent Labs  07/26/14 1851 07/28/14 0400  NA 135 131*  K 3.5 4.0  CL 98* 99*  CO2 27 26  GLUCOSE 113* 136*  BUN 13 12  CREATININE 1.02 0.97  CALCIUM 8.2* 7.3*    PT/INR:  Recent Labs  07/26/14 1851  LABPROT 15.3*  INR 1.19   Radiology: Dg Chest Port 1 View  07/28/2014   CLINICAL DATA:  Respiratory failure. Hyperlipidemia and hypertension.  EXAM: PORTABLE CHEST - 1 VIEW  COMPARISON:  1 day prior  FINDINGS: Right internal jugular line is unchanged, tip at low SVC. Left-sided  chest tube or chest tubes unchanged in position. The previously described left apical pneumothorax has resolved. Midline trachea. Cardiomegaly accentuated by AP portable technique. Interstitial prominence is greater on the left and minimally increased. Small left pleural effusion is decreased. Improved left worse than right base airspace disease.  IMPRESSION: Resolution of left-sided pneumothorax.  Progressive interstitial edema.  Slight improvement in left worse than right base airspace disease with decreased small left pleural effusion.   Electronically Signed   By: Abigail Miyamoto M.D.   On: 07/28/2014 08:28   Dg Chest Port 1 View  07/27/2014   CLINICAL DATA:  Central  line and chest tube placement  EXAM: PORTABLE CHEST - 1 VIEW  COMPARISON:  CT and radiographs earlier the same date.  FINDINGS: 1031 hours. New right IJ central venous catheter extends to the lower SVC level. Left chest tube has been placed with partial evacuation of the left pleural effusion. There is a small left apical pneumothorax. No pneumothorax is present on the right. Overall left base aeration has improved with mild left-greater-than-right basilar airspace opacities. The heart size and mediastinal contours are stable.  IMPRESSION: 1. Central line placement as described. No right-sided pneumothorax. 2. Decreased left pleural effusion following left chest tube placement. Small left apical pneumothorax.   Electronically Signed   By: Richardean Sale M.D.   On: 07/27/2014 11:03    Recent Results (from the past 240 hour(s))  Culture, blood (routine x 2)     Status: None   Collection Time: 07/21/14  4:14 PM  Result Value Ref Range Status   Specimen Description BLOOD LEFT WRIST  Final   Special Requests BOTTLES DRAWN AEROBIC AND ANAEROBIC 5CC  Final   Culture   Final    NO GROWTH 5 DAYS Performed at [facility]    Report Status 07/27/2014 FINAL  Final  Culture, blood (routine x 2)     Status: None   Collection Time: 07/21/14  4:14 PM  Result Value Ref Range Status   Specimen Description BLOOD RIGHT HAND  Final   Special Requests BOTTLES DRAWN AEROBIC AND ANAEROBIC 5CC  Final   Culture   Final    NO GROWTH 5 DAYS Performed at [facility]    Report Status 07/27/2014 FINAL  Final  Culture, sputum-assessment     Status: None   Collection Time: 07/22/14  3:05 AM  Result Value Ref Range Status   Specimen Description SPUTUM  Final   Special Requests Normal  Final   Sputum evaluation   Final    THIS SPECIMEN IS ACCEPTABLE. RESPIRATORY CULTURE REPORT TO FOLLOW.   Report Status 07/22/2014 FINAL  Final  Culture, respiratory (NON-Expectorated)     Status: None   Collection  Time: 07/22/14  3:05 AM  Result Value Ref Range Status   Specimen Description SPUTUM  Final   Special Requests NONE  Final   Gram Stain   Final    MODERATE WBC PRESENT,BOTH PMN AND MONONUCLEAR RARE SQUAMOUS EPITHELIAL CELLS PRESENT FEW GRAM NEGATIVE RODS Performed at [facility]    Culture   Final    NORMAL OROPHARYNGEAL FLORA Performed at [facility]    Report Status 07/24/2014 FINAL  Final  Body fluid culture     Status: None (Preliminary result)   Collection Time: 07/26/14  3:11 PM  Result Value Ref Range Status   Specimen Description THORACENTESIS  Final   Special Requests Normal  Final   Gram Stain   Final  FEW WBC PRESENT,BOTH PMN AND MONONUCLEAR NO ORGANISMS SEEN Performed at [facility]    Culture NO GROWTH Performed at [facility]   Final   Report Status PENDING  Incomplete  Gram stain     Status: None   Collection Time: 07/27/14  8:03 AM  Result Value Ref Range Status   Specimen Description BRONCHIAL WASHINGS LEFT  Final   Special Requests LEFT LOWER LOBE WASHINGS BOTTLE A  Final   Gram Stain   Final    SPECIMEN LABELED A MODERATE WBC PRESENT, PREDOMINANTLY PMN RARE GRAM POSITIVE RODS RARE GRAM POSITIVE COCCI CALLED RN HICKLING,K AT [place] ON [phone] [place]    Report Status 07/27/2014 FINAL  Final  Gram stain     Status: None   Collection Time: 07/27/14  9:04 AM  Result Value Ref Range Status   Specimen Description PLEURAL  Final   Special Requests LEFT PLEURAL FLUID BOTTLE B  Final   Gram Stain   Final    SPECIMEN LABELED B RARE WBC PRESENT, PREDOMINANTLY PMN NO ORGANISMS SEEN CALLED RN HICKLING,K AT [address] ON [id number] [facility]    Report Status 07/27/2014 FINAL  Final  MRSA PCR Screening     Status: None   Collection Time: 07/27/14 11:20 AM  Result Value Ref Range Status   MRSA by PCR NEGATIVE NEGATIVE Final    Comment:        The GeneXpert MRSA Assay (FDA approved for NASAL specimens only), is one component of  a comprehensive MRSA colonization surveillance program. It is not intended to diagnose MRSA infection nor to guide or monitor treatment for MRSA infections.     Assessment/Plan: S/P Procedure(s) (LRB): VIDEO BRONCHOSCOPY (N/A) VIDEO ASSISTED THORACOSCOPY (VATS)/EMPYEMA Left, drainage pleural effusion. (Left) Mobilize Plan for transfer to step-down: see transfer orders leave chest tube in today Improving Culture from or negative so far, gram positive cocci on bronch washing   [name] 07/28/2014 8:57 AM

## 2014-07-28 NOTE — Op Note (Signed)
NAMEZAINE, ELSASS NO.:  [id number]  MEDICAL RECORD NO.:  [id number]  LOCATION:  2S07C                        FACILITY:  [facility]  PHYSICIAN:  Lanelle Bal, MD    DATE OF BIRTH:  January 14, 1951  DATE OF PROCEDURE:  07/27/2014 DATE OF DISCHARGE:                              OPERATIVE REPORT   PREOPERATIVE DIAGNOSIS:  Left lower lobe pneumonia with empyema.  POSTOPERATIVE DIAGNOSIS:  Left lower lobe pneumonia with empyema.  SURGICAL PROCEDURE:  Bronchoscopy with bronchial washings and culture. Left video-assisted thoracoscopy with drainage of empyema and decortication.  SURGEON:  Lanelle Bal, MD.  FIRST ASSISTANT:  Suzzanne Cloud, PA.  BRIEF HISTORY:  The patient is a 64 year old male who presented with several weeks of increasing left chest discomfort.  Ultimately, he was admitted to [facility] with fever, chills, and left flank pain. Further evaluation revealed a left lower lobe pneumonia and evidence of empyema.  He was transferred to [facility] for a surgical intervention and drainage of empyema.  Risks and options of surgery were discussed with the patient in detail, and he agreed and signed informed consent.  DESCRIPTION OF PROCEDURE:  With central line and arterial line in place, the patient underwent general endotracheal anesthesia with a single- lumen endotracheal tube.  Appropriate time-out was performed.  Through this endotracheal tube, a fiberoptic bronchoscope was passed to the subsegmental level, both in the right and left tracheobronchial tree. There were some tenacious secretions noted, particularly toward the left lower lobe with bronchial lavage and suctioning.  These secretions were removed.  Specimens from the washings of the left lower lobe were sent for stat gram stain and culture.  The scope was then removed, and a bronchial blocker tube was placed.  With the patient was turned in lateral decubitus position with the left side  up, left chest was prepped with Betadine and draped in usual sterile manner.  A second time-out was performed.  We then proceeded with a small incision in the midaxillary line, approximately sixth intercostal space.  Through this, a scope was placed.  There were numerous complex loculated effusion that was present.  This incision was expanded slightly; and through this, decortication and evacuation of the hematoma was performed.  As we freed up more space, a second port site was made more anteriorly; and through this, the scope was used to further decorticate to allow visualization as we decorticated through the first incision.  With the fluid drained and the proteinaceous debris removed, we left through the port site a 28 Blake drain anteriorly and a second 28 Blake drain posteriorly through a separate site.  The lung was reinflated.  The initial incision was closed with interrupted 0 Vicryl, and a running 2-0 Vicryl and a 3-0 subcuticular stitch.  Dermabond was placed on this incision, and the 2 chest tubes were secured in place.  The patient was awakened in the operating room and extubated.  He tolerated the procedure without obvious complication.  Blood loss was approximately 100 mL.  The sponge and needle count was reported as correct at completion of the procedure.     Lanelle Bal, MD     EG/MEDQ  D:  07/28/2014  T:  07/28/2014  Job:  [id number]

## 2014-07-29 ENCOUNTER — Encounter (HOSPITAL_COMMUNITY): Payer: Self-pay | Admitting: Cardiothoracic Surgery

## 2014-07-29 ENCOUNTER — Inpatient Hospital Stay (HOSPITAL_COMMUNITY): Payer: Medicare Other

## 2014-07-29 LAB — COMPREHENSIVE METABOLIC PANEL
ALK PHOS: 92 U/L (ref 38–126)
ALT: 35 U/L (ref 17–63)
AST: 34 U/L (ref 15–41)
Albumin: 1.7 g/dL — ABNORMAL LOW (ref 3.5–5.0)
Anion gap: 7 (ref 5–15)
BUN: 10 mg/dL (ref 6–20)
CO2: 27 mmol/L (ref 22–32)
CREATININE: 0.99 mg/dL (ref 0.61–1.24)
Calcium: 7.8 mg/dL — ABNORMAL LOW (ref 8.9–10.3)
Chloride: 101 mmol/L (ref 101–111)
GFR calc Af Amer: >60 mL/min (ref >60)
GFR calc non Af Amer: >60 mL/min (ref >60)
GLUCOSE: 94 mg/dL (ref 65–99)
Potassium: 4 mmol/L (ref 3.5–5.1)
Sodium: 135 mmol/L (ref 135–145)
TOTAL PROTEIN: 5.6 g/dL — AB (ref 6.5–8.1)
Total Bilirubin: 0.6 mg/dL (ref 0.3–1.2)

## 2014-07-29 LAB — CBC
HEMATOCRIT: 26.7 % — AB (ref 39.0–52.0)
Hemoglobin: 9 g/dL — ABNORMAL LOW (ref 13.0–17.0)
MCH: 31.6 pg (ref 26.0–34.0)
MCHC: 33.7 g/dL (ref 30.0–36.0)
MCV: 93.7 fL (ref 78.0–100.0)
Platelets: 582 10*3/uL — ABNORMAL HIGH (ref 150–400)
RBC: 2.85 MIL/uL — AB (ref 4.22–5.81)
RDW: 14.2 % (ref 11.5–15.5)
WBC: 19.2 10*3/uL — ABNORMAL HIGH (ref 4.0–10.5)

## 2014-07-29 LAB — MAGNESIUM: Magnesium: 2.1 mg/dL (ref 1.7–2.4)

## 2014-07-29 LAB — CULTURE, RESPIRATORY W GRAM STAIN

## 2014-07-29 LAB — PHOSPHORUS: Phosphorus: 2.7 mg/dL (ref 2.5–4.6)

## 2014-07-29 MED ORDER — SODIUM CHLORIDE 0.9 % IJ SOLN
10.0000 mL | INTRAMUSCULAR | Status: DC | PRN
  Administered 2014-07-29 – 2014-07-30 (×2): 10 mL
  Filled 2014-07-29 (×2): qty 40

## 2014-07-29 NOTE — Progress Notes (Addendum)
[address]       [place] [address]             [phone]      2 Days Post-Op Procedure(s) (LRB): VIDEO BRONCHOSCOPY (N/A) VIDEO ASSISTED THORACOSCOPY (VATS)/EMPYEMA Left, drainage pleural effusion. (Left) Subjective: Feeling better  Objective: Vital signs in last 24 hours: Temp:  [97.7 F (36.5 C)-98.7 F (37.1 C)] 97.7 F (36.5 C) (06/13 0454) Pulse Rate:  [69-84] 69 (06/13 0454) Cardiac Rhythm:  [-] Normal sinus rhythm (06/12 2115) Resp:  [17-31] 20 (06/13 0454) BP: (95-146)/(52-87) 99/66 mmHg (06/13 0454) SpO2:  [89 %-98 %] 93 % (06/13 0454)  Hemodynamic parameters for last 24 hours:    Intake/Output from previous day: 06/12 0701 - 06/13 0700 In: 1070 [P.O.:720; I.V.:100; IV Piggyback:250] Out: 2380 [Urine:2320; Chest Tube:60] Intake/Output this shift: Total I/O In: 60 [P.O.:60] Out: -   General appearance: alert, cooperative and no distress Heart: regular rate and rhythm Lungs: dim in left base Abdomen: mod distension, non tender Extremities: no edema Wound: incis dressing CDI  Lab Results:  Recent Labs  07/28/14 0400 07/29/14 0255  WBC 23.0* 19.2*  HGB 8.7* 9.0*  HCT 25.4* 26.7*  PLT 520* 582*   BMET:  Recent Labs  07/28/14 0400 07/29/14 0255  NA 131* 135  K 4.0 4.0  CL 99* 101  CO2 26 27  GLUCOSE 136* 94  BUN 12 10  CREATININE 0.97 0.99  CALCIUM 7.3* 7.8*    PT/INR:  Recent Labs  07/26/14 1851  LABPROT 15.3*  INR 1.19   ABG    Component Value Date/Time   PHART 7.361 07/28/2014 0413   HCO3 26.0* 07/28/2014 0413   TCO2 27 07/28/2014 0413   O2SAT 92.0 07/28/2014 0413   CBG (last 3)  No results for input(s): GLUCAP in the last 72 hours.  Meds Scheduled Meds: . acetaminophen  1,000 mg Oral 4 times per day   Or  . acetaminophen (TYLENOL) oral liquid 160 mg/5 mL  1,000 mg Oral 4 times per day  . bisacodyl  10 mg Oral Daily  . citalopram  20 mg Oral QHS  . enoxaparin (LOVENOX) injection  40 mg  Subcutaneous Q24H  . guaiFENesin  600 mg Oral BID  . hydrochlorothiazide  25 mg Oral Daily  . piperacillin-tazobactam (ZOSYN)  IV  3.375 g Intravenous 3 times per day  . senna-docusate  1 tablet Oral QHS  . vancomycin  1,000 mg Intravenous Q12H   Continuous Infusions:  PRN Meds:.diphenhydrAMINE **OR** diphenhydrAMINE, fentaNYL (SUBLIMAZE) injection, naloxone **AND** sodium chloride, ondansetron (ZOFRAN) IV, oxyCODONE, potassium chloride, traMADol  Xrays Dg Chest Port 1 View  07/29/2014   CLINICAL DATA:  Chest soreness, chest tube in place.  EXAM: PORTABLE CHEST - 1 VIEW  COMPARISON:  07/28/2014 and CT chest 07/26/2014.  FINDINGS: Trachea is midline. Heart size stable. Right IJ central line tip projects over the SVC. Two left chest tubes terminate in the upper left hemi thorax. No definite pneumothorax. Patchy airspace consolidation in the left upper and left lower lobes, unchanged. Minimal linear opacification at the base of the right hemi thorax. Small bilateral effusions, left greater than right.  IMPRESSION: 1. Left upper and left lower lobe pneumonia with necrotic features on 07/26/2014. 2. Small residual left pleural effusion/empyema with 2 left chest tubes in place. 3. Minimal right basilar atelectasis or pneumonia with a tiny right pleural effusion.   Electronically Signed   By: Lorin Picket M.D.  On: 07/29/2014 07:41   Dg Chest Port 1 View  07/28/2014   CLINICAL DATA:  Respiratory failure. Hyperlipidemia and hypertension.  EXAM: PORTABLE CHEST - 1 VIEW  COMPARISON:  1 day prior  FINDINGS: Right internal jugular line is unchanged, tip at low SVC. Left-sided chest tube or chest tubes unchanged in position. The previously described left apical pneumothorax has resolved. Midline trachea. Cardiomegaly accentuated by AP portable technique. Interstitial prominence is greater on the left and minimally increased. Small left pleural effusion is decreased. Improved left worse than right base airspace  disease.  IMPRESSION: Resolution of left-sided pneumothorax.  Progressive interstitial edema.  Slight improvement in left worse than right base airspace disease with decreased small left pleural effusion.   Electronically Signed   By: Abigail Miyamoto M.D.   On: 07/28/2014 08:28   Dg Chest Port 1 View  07/27/2014   CLINICAL DATA:  Central line and chest tube placement  EXAM: PORTABLE CHEST - 1 VIEW  COMPARISON:  CT and radiographs earlier the same date.  FINDINGS: 1031 hours. New right IJ central venous catheter extends to the lower SVC level. Left chest tube has been placed with partial evacuation of the left pleural effusion. There is a small left apical pneumothorax. No pneumothorax is present on the right. Overall left base aeration has improved with mild left-greater-than-right basilar airspace opacities. The heart size and mediastinal contours are stable.  IMPRESSION: 1. Central line placement as described. No right-sided pneumothorax. 2. Decreased left pleural effusion following left chest tube placement. Small left apical pneumothorax.   Electronically Signed   By: Richardean Sale M.D.   On: 07/27/2014 11:03   Chest tube- no air leak, 100 cc recorded yesterday   Assessment/Plan: S/P Procedure(s) (LRB): VIDEO BRONCHOSCOPY (N/A) VIDEO ASSISTED THORACOSCOPY (VATS)/EMPYEMA Left, drainage pleural effusion. (Left)  1 overall, showing good improvement 2 afebrile , leukocytosis improving, cont vanc/zosyn 3 H/H pretty stable 4 renal fxn normal 5 poss 1 chest tube today 6 sugars well controlled 7 rehab/pulm toilet- push as able   LOS: 8 days    GOLD,WAYNE E 07/29/2014  Chest tube to water seal today, d/c one chest tube tomorrow Improving I have seen and examined Elmer E Lorenzo and agree with the above assessment  and plan.  [name] MD Beeper [phone] Office [phone] 07/29/2014 3:14 PM

## 2014-07-29 NOTE — Progress Notes (Signed)
Patient Demographics:    Shane Vasquez, is a 64 y.o. male, DOB - 01-28-1951, ZJQ:[id number]  Admit date - 07/21/2014   Admitting Physician No admitting provider for patient encounter.  Outpatient Primary MD for the patient is [name], MD  LOS - 8   Chief Complaint  Patient presents with  . Fever  . Flank Pain        Subjective:   Shane Vasquez is a 64 y.o. male with PMH of GERD, HTN, HLD, who presented to the ED on 07/21/2014 with fever, chills and sweating for 4 days. He had a non-productive cough, and left chest pain with deep inspiration, coughing and movement. He was diagnosed with LLL PNA and on 07/28/2014 he had a VATS procedure. Today he feels well-rested. He denies headache, fever, chills, nausea, tingling or numbness. He endorses 10/10 pain at his incision site with deep inspiration and says he isn't very hungry because he needs to have another bowel movement.   Assessment  & Plan :    Principal Problem:   Left lower lobe pneumonia Active Problems:   Dyslipidemia   Essential hypertension   GERD   Community acquired pneumonia   Parapneumonic effusion   CAP (community acquired pneumonia)   Empyema   Acute on chronic respiratory failure  Left lower lobe CAP with empyema - Patient undergoing VATS on 07/28/2014, procedure performed by Dr. Servando Snare - 2 left chest tube placements confirmed with CXR  - WBC from 23.0 >> 19.2 - Left bronchial washing culture shows on-Pathogenic Oropharyngeal-type Flora, awaiting other cultures - Continue IV Vancomycin, Zosyn, Guaifenisin, and pain management  Acute on chronic respiratory failure in setting of CAP - O2 sats at 93% this morning, stable  - Get patient ambulating within limits of pain - Continue O2 nasal cannula  Essential  hypertension - Continue home meds and monitoring  Constipation - Patient had a bowel movement last night - Continue dulcolax  GERD - Continue home meds  Dyslipidemia - Continue home meds    Code Status : Full Code  Family Communication  :  None  Disposition Plan  : Home when ready  Consults  :  Cardiothoracic Surgery  Procedures  : VATS  DVT Prophylaxis  :  Lovenox  Lab Results  Component Value Date   PLT 582* 07/29/2014    Inpatient Medications  Scheduled Meds: . acetaminophen  1,000 mg Oral 4 times per day   Or  . acetaminophen (TYLENOL) oral liquid 160 mg/5 mL  1,000 mg Oral 4 times per day  . bisacodyl  10 mg Oral Daily  . citalopram  20 mg Oral QHS  . enoxaparin (LOVENOX) injection  40 mg Subcutaneous Q24H  . guaiFENesin  600 mg Oral BID  . hydrochlorothiazide  25 mg Oral Daily  . piperacillin-tazobactam (ZOSYN)  IV  3.375 g Intravenous 3 times per day  . senna-docusate  1 tablet Oral QHS  . vancomycin  1,000 mg Intravenous Q12H   Continuous Infusions:  PRN Meds:.diphenhydrAMINE **OR** diphenhydrAMINE, fentaNYL (SUBLIMAZE) injection, naloxone **AND** sodium chloride, ondansetron (ZOFRAN) IV, oxyCODONE, potassium chloride, traMADol  Antibiotics  :    Anti-infectives    Start     Dose/Rate Route Frequency Ordered Stop   07/28/14 1000  azithromycin (  ZITHROMAX) tablet 500 mg  Status:  Discontinued     500 mg Oral Every 24 hours 07/27/14 0831 07/27/14 1146   07/27/14 1400  piperacillin-tazobactam (ZOSYN) IVPB 3.375 g     3.375 g 12.5 mL/hr over 240 Minutes Intravenous 3 times per day 07/27/14 1155     07/27/14 1200  vancomycin (VANCOCIN) IVPB 1000 mg/200 mL premix     1,000 mg 200 mL/hr over 60 Minutes Intravenous Every 12 hours 07/27/14 1155     07/27/14 0830  azithromycin (ZITHROMAX) 500 mg in dextrose 5 % 250 mL IVPB     500 mg 250 mL/hr over 60 Minutes Intravenous To Surgery 07/27/14 0831 07/27/14 0954   07/24/14 0000  levofloxacin (LEVAQUIN) 500  MG tablet     500 mg Oral Daily 07/24/14 2152     07/22/14 1400  cefTRIAXone (ROCEPHIN) 1 g in dextrose 5 % 50 mL IVPB - Premix  Status:  Discontinued     1 g 100 mL/hr over 30 Minutes Intravenous Every 24 hours 07/21/14 1643 07/27/14 1146   07/22/14 1000  [MAR Hold]  azithromycin (ZITHROMAX) tablet 500 mg  Status:  Discontinued     (MAR Hold since 07/27/14 0630)   500 mg Oral Every 24 hours 07/21/14 1643 07/27/14 0831   07/21/14 1330  cefTRIAXone (ROCEPHIN) 1 g in dextrose 5 % 50 mL IVPB     1 g 100 mL/hr over 30 Minutes Intravenous  Once 07/21/14 1329 07/21/14 1457   07/21/14 1330  azithromycin (ZITHROMAX) 500 mg in dextrose 5 % 250 mL IVPB     500 mg 250 mL/hr over 60 Minutes Intravenous  Once 07/21/14 1329 07/21/14 1616        Objective:   Filed Vitals:   07/28/14 1500 07/28/14 1642 07/28/14 2128 07/29/14 0454  BP: 113/67 135/87 114/65 99/66  Pulse: 78 80 80 69  Temp:  98.3 F (36.8 C) 98.1 F (36.7 C) 97.7 F (36.5 C)  TempSrc:  Oral Oral Oral  Resp: '20 20 20 20  '$ Height:      Weight:      SpO2: 93% 95% 93% 93%    Wt Readings from Last 3 Encounters:  07/27/14 73.6 kg (162 lb 4.1 oz)  02/13/14 77.111 kg (170 lb)  01/04/13 73.483 kg (162 lb)     Intake/Output Summary (Last 24 hours) at 07/29/14 1338 Last data filed at 07/29/14 0853  Gross per 24 hour  Intake    110 ml  Output   1310 ml  Net  -1200 ml     Physical Exam  General: In no acute distress, appears healthy and well nourished, eating breakfast. HENT: Normocephalic, moist oral mucosa without erythema or exudates. Neck: Supple, no lymphadenopathy  Cardiac: RRR, no Murmurs, no LE edema noted, no JVD, peripheral pulses palpable at 2+ Respiratory: Diffuse rhonchi through L lung fields with some rales noted near incision site.   Abdomen: Positive bowel sounds, soft, non tender, no masses, some distention in LUQ Skin:No rashes. Mildly erythematous, dry region above L eyebrow. Incision site not  visualized, no erythema or seepage around wound cover. Extremities: Symmetric without obvious trauma or injury, intact ROM     Data Review:   Micro Results Recent Results (from the past 240 hour(s))  Culture, blood (routine x 2)     Status: None   Collection Time: 07/21/14  4:14 PM  Result Value Ref Range Status   Specimen Description BLOOD LEFT WRIST  Final   Special Requests BOTTLES  DRAWN AEROBIC AND ANAEROBIC 5CC  Final   Culture   Final    NO GROWTH 5 DAYS Performed at [facility]    Report Status 07/27/2014 FINAL  Final  Culture, blood (routine x 2)     Status: None   Collection Time: 07/21/14  4:14 PM  Result Value Ref Range Status   Specimen Description BLOOD RIGHT HAND  Final   Special Requests BOTTLES DRAWN AEROBIC AND ANAEROBIC 5CC  Final   Culture   Final    NO GROWTH 5 DAYS Performed at [facility]    Report Status 07/27/2014 FINAL  Final  Culture, sputum-assessment     Status: None   Collection Time: 07/22/14  3:05 AM  Result Value Ref Range Status   Specimen Description SPUTUM  Final   Special Requests Normal  Final   Sputum evaluation   Final    THIS SPECIMEN IS ACCEPTABLE. RESPIRATORY CULTURE REPORT TO FOLLOW.   Report Status 07/22/2014 FINAL  Final  Culture, respiratory (NON-Expectorated)     Status: None   Collection Time: 07/22/14  3:05 AM  Result Value Ref Range Status   Specimen Description SPUTUM  Final   Special Requests NONE  Final   Gram Stain   Final    MODERATE WBC PRESENT,BOTH PMN AND MONONUCLEAR RARE SQUAMOUS EPITHELIAL CELLS PRESENT FEW GRAM NEGATIVE RODS Performed at [facility]    Culture   Final    NORMAL OROPHARYNGEAL FLORA Performed at [facility]    Report Status 07/24/2014 FINAL  Final  Body fluid culture     Status: None (Preliminary result)   Collection Time: 07/26/14  3:11 PM  Result Value Ref Range Status   Specimen Description THORACENTESIS  Final   Special Requests Normal  Final    Gram Stain   Final    FEW WBC PRESENT,BOTH PMN AND MONONUCLEAR NO ORGANISMS SEEN Performed at [facility]    Culture   Final    NO GROWTH 2 DAYS Performed at [facility]    Report Status PENDING  Incomplete  Fungus Culture with Smear     Status: None (Preliminary result)   Collection Time: 07/27/14  8:03 AM  Result Value Ref Range Status   Specimen Description BRONCHIAL WASHINGS LEFT  Final   Special Requests LEFT LOWER LOBE WASHINGS BOTTLE A  Final   Fungal Smear   Final    NO YEAST OR FUNGAL ELEMENTS SEEN Performed at [facility]    Culture   Final    CULTURE IN PROGRESS FOR FOUR WEEKS Performed at [facility]    Report Status PENDING  Incomplete  Gram stain     Status: None   Collection Time: 07/27/14  8:03 AM  Result Value Ref Range Status   Specimen Description BRONCHIAL WASHINGS LEFT  Final   Special Requests LEFT LOWER LOBE WASHINGS BOTTLE A  Final   Gram Stain   Final    SPECIMEN LABELED A MODERATE WBC PRESENT, PREDOMINANTLY PMN RARE GRAM POSITIVE RODS RARE GRAM POSITIVE COCCI CALLED RN [place] AT [phone] ON [phone] [place]    Report Status 07/27/2014 FINAL  Final  AFB culture with smear     Status: None (Preliminary result)   Collection Time: 07/27/14  8:03 AM  Result Value Ref Range Status   Specimen Description BRONCHIAL WASHINGS LEFT  Final   Special Requests LEFT LOWER LOBE WASHINGS BOTTLE A  Final   Acid Fast Smear   Final  NO ACID FAST BACILLI SEEN Performed at [facility]    Culture   Final    CULTURE WILL BE EXAMINED FOR 6 WEEKS BEFORE ISSUING A FINAL REPORT Performed at [facility]    Report Status PENDING  Incomplete  Culture, respiratory (NON-Expectorated)     Status: None   Collection Time: 07/27/14  8:03 AM  Result Value Ref Range Status   Specimen Description BRONCHIAL WASHINGS LEFT  Final   Special Requests LEFT LOWER LOBE WASHINGS BOTTLE A  Final   Gram Stain   Final    FEW WBC  PRESENT,BOTH PMN AND MONONUCLEAR NO SQUAMOUS EPITHELIAL CELLS SEEN NO ORGANISMS SEEN Performed at [facility]    Culture   Final    Non-Pathogenic Oropharyngeal-type Flora Isolated. Performed at [facility]    Report Status 07/29/2014 FINAL  Final  Anaerobic culture     Status: None (Preliminary result)   Collection Time: 07/27/14  9:04 AM  Result Value Ref Range Status   Specimen Description PLEURAL  Final   Special Requests LEFT PLEURAL FLUID BOTTLE B  Final   Gram Stain   Final    NO WBC SEEN NO SQUAMOUS EPITHELIAL CELLS SEEN NO ORGANISMS SEEN Performed at [facility]    Culture   Final    NO ANAEROBES ISOLATED; CULTURE IN PROGRESS FOR 5 DAYS Performed at [facility]    Report Status PENDING  Incomplete  Fungus Culture with Smear     Status: None (Preliminary result)   Collection Time: 07/27/14  9:04 AM  Result Value Ref Range Status   Specimen Description PLEURAL  Final   Special Requests LEFT PLEURAL FLUID BOTTLE B  Final   Fungal Smear   Final    NO YEAST OR FUNGAL ELEMENTS SEEN Performed at [facility]    Culture   Final    CULTURE IN PROGRESS FOR FOUR WEEKS Performed at [facility]    Report Status PENDING  Incomplete  Gram stain     Status: None   Collection Time: 07/27/14  9:04 AM  Result Value Ref Range Status   Specimen Description PLEURAL  Final   Special Requests LEFT PLEURAL FLUID BOTTLE B  Final   Gram Stain   Final    SPECIMEN LABELED B RARE WBC PRESENT, PREDOMINANTLY PMN NO ORGANISMS SEEN CALLED RN [place],K AT [phone] ON [id number] [facility]    Report Status 07/27/2014 FINAL  Final  AFB culture with smear     Status: None (Preliminary result)   Collection Time: 07/27/14  9:04 AM  Result Value Ref Range Status   Specimen Description PLEURAL  Final   Special Requests LEFT PLEURAL FLUID BOTTLE B  Final   Acid Fast Smear   Final    NO ACID FAST BACILLI SEEN Performed at [facility]     Culture   Final    CULTURE WILL BE EXAMINED FOR 6 WEEKS BEFORE ISSUING A FINAL REPORT Performed at [facility]    Report Status PENDING  Incomplete  Anaerobic culture     Status: None (Preliminary result)   Collection Time: 07/27/14  9:08 AM  Result Value Ref Range Status   Specimen Description TISSUE  Final   Special Requests LEFT PLEURAL PEEL BOTTLE C  Final   Gram Stain   Final    NO WBC SEEN NO SQUAMOUS EPITHELIAL CELLS SEEN NO ORGANISMS SEEN Performed at [facility]  Final    NO ANAEROBES ISOLATED; CULTURE IN PROGRESS FOR 5 DAYS Performed at [facility]    Report Status PENDING  Incomplete  Fungus Culture with Smear     Status: None (Preliminary result)   Collection Time: 07/27/14  9:08 AM  Result Value Ref Range Status   Specimen Description TISSUE  Final   Special Requests LEFT PLEURAL PEEL BOTTLE C  Final   Fungal Smear   Final    NO YEAST OR FUNGAL ELEMENTS SEEN Performed at [facility]    Culture   Final    CULTURE IN PROGRESS FOR FOUR WEEKS Performed at [facility]    Report Status PENDING  Incomplete  Gram stain     Status: None   Collection Time: 07/27/14  9:08 AM  Result Value Ref Range Status   Specimen Description TISSUE  Final   Special Requests LEFT PLEURAL PEEL BOTTLE C  Final   Gram Stain   Final    NO WBC SEEN NO ORGANISMS SEEN Performed at [facility]    Report Status 07/28/2014 FINAL  Final  AFB culture with smear     Status: None (Preliminary result)   Collection Time: 07/27/14  9:08 AM  Result Value Ref Range Status   Specimen Description TISSUE  Final   Special Requests LEFT PLEURAL PEEL BOTTLE C  Final   Acid Fast Smear   Final    NO ACID FAST BACILLI SEEN Performed at [facility]    Culture   Final    CULTURE WILL BE EXAMINED FOR 6 WEEKS BEFORE ISSUING A FINAL REPORT Performed at [facility]    Report Status PENDING  Incomplete  MRSA PCR Screening      Status: None   Collection Time: 07/27/14 11:20 AM  Result Value Ref Range Status   MRSA by PCR NEGATIVE NEGATIVE Final    Comment:        The GeneXpert MRSA Assay (FDA approved for NASAL specimens only), is one component of a comprehensive MRSA colonization surveillance program. It is not intended to diagnose MRSA infection nor to guide or monitor treatment for MRSA infections.     Radiology Reports Dg Chest 2 View  07/23/2014   CLINICAL DATA:  Shortness of breath. Followup left lower lobe pneumonia and associated left pleural effusion.  EXAM: CHEST  2 VIEW  COMPARISON:  Two-view chest x-ray and CTA chest 07/21/2014.  FINDINGS: Since the prior examinations, interval development of moderate diffuse interstitial pulmonary edema. Cardiac silhouette mildly enlarged. Dense airspace consolidation in the left lower lobe, increased since the prior examinations. No confluent airspace consolidation elsewhere. Moderate-sized left pleural effusion and small right pleural effusion, increased on the left and new on the right. Degenerative disc disease and spondylosis throughout the thoracic and visualized upper lumbar spine.  IMPRESSION: 1. Interval development of CHF and/or fluid overload since the examinations 2 days ago, with stable mild cardiomegaly and moderate diffuse interstitial pulmonary edema. 2. Worsening pneumonia involving the left lower lobe. 3. Bilateral pleural effusions, moderate-sized on the left and small on the right.   Electronically Signed   By: Evangeline Dakin M.D.   On: 07/23/2014 15:09   Dg Chest 2 View  07/21/2014   CLINICAL DATA:  Fever, night sweats, LEFT flank pain for 5 days, LEFT side pain with deep breathing, history hypertension, former smoker  EXAM: CHEST  2 VIEW  COMPARISON:  None  FINDINGS: Upper normal heart size.  Mediastinal contours  and pulmonary vascularity normal.  Subsegmental atelectasis RIGHT base.  LEFT lower lobe consolidation consistent with pneumonia.   Upper lungs clear.  Underlying emphysematous and bronchitic changes.  No pleural effusion or pneumothorax.  Bones demineralized.  IMPRESSION: Question COPD changes with mi subsegmental atelectasis RIGHT base.  LEFT lower lobe consolidation consistent with pneumonia.  Followup PA and lateral chest X-ray is recommended in 3-4 weeks following trial of antibiotic therapy to ensure resolution and exclude underlying malignancy.   Electronically Signed   By: Lavonia Dana M.D.   On: 07/21/2014 13:28   Ct Chest W Contrast  07/26/2014   CLINICAL DATA:  Pneumonia, cough, and shortness of breath.  EXAM: CT CHEST WITH CONTRAST  TECHNIQUE: Multidetector CT imaging of the chest was performed during intravenous contrast administration.  CONTRAST:  85m OMNIPAQUE IOHEXOL 300 MG/ML  SOLN  COMPARISON:  07/21/2014 chest CT  FINDINGS: Small mediastinal lymph nodes are similar to the prior study, most likely reactive. Heart size is within normal limits. There is no pericardial effusion.  There is a new, small right pleural effusion. Centrilobular emphysema is again seen. Minimal scarring or subsegmental atelectasis is noted in the right middle lobe, improved from prior. There is also linear scarring or subsegmental atelectasis laterally in the right lower lobe. There is a more confluent region of opacity involving the basilar right lower lobe with some air bronchograms.  Left pleural effusion has increased in size, now moderate. The effusion appears homogeneous and low in density with at most minimal pleural thickening posteriorly. The effusion is partially loculated, medially more so than laterally near the lung apex. A small amount of fluid is also noted in the left major fissure. There is increased confluent opacity involving the majority of the left lower lobe which represents a combination of increased compressive atelectasis from the pleural effusion as well as persistent airspace filling due to the previously described  pneumonia. No frank left lower lobe cavitation or abscess is identified. Mild atelectasis is present in the lingula.  Small hiatal hernia is less conspicuous than on the prior CT. No acute osseous abnormality is identified.  IMPRESSION: 1. Moderate, partially loculated left pleural effusion, greatly increased in size from prior CT. 2. Persistent extensive left lower lobe pneumonia with increased compressive atelectasis from the effusion. 3. New, small right pleural effusion. 4. Basilar right lower lobe atelectasis versus pneumonia.   Electronically Signed   By: ALogan Bores  On: 07/26/2014 12:42   Ct Angio Chest Pe W/cm &/or Wo Cm  07/21/2014   CLINICAL DATA:  Fever and left-sided flank pain. Left lower lobe pneumonia.  EXAM: CT ANGIOGRAPHY CHEST WITH CONTRAST  TECHNIQUE: Multidetector CT imaging of the chest was performed using the standard protocol during bolus administration of intravenous contrast. Multiplanar CT image reconstructions and MIPs were obtained to evaluate the vascular anatomy.  CONTRAST:  850mOMNIPAQUE IOHEXOL 350 MG/ML SOLN  COMPARISON:  Chest x-ray dated 07/21/2014  FINDINGS: There are no pulmonary emboli. There is a 7 x 6 by 6 cm area of consolidation in the left lower lobe with adjacent areas of less well-defined consolidation. There is atelectasis at the right lung base. There are several slightly enlarged lymph nodes in the left hilum measuring 9 mm, 8 mm, and 11 mm on images 130 through 137 of series 10. Heart size is normal. Minimal coronary artery calcification.  Small left pleural effusion, loculated posterior to the superior segment of the left lower lobe. Diffuse emphysematous changes. No acute osseous  abnormality. Small hiatal hernia. The visualized portion of the upper abdomen is otherwise normal.  Review of the MIP images confirms the above findings.  IMPRESSION: 1. Extensive consolidative pneumonia in the left lower lobe with reactive left hilar adenopathy. 2. Small loculated  left pleural effusion. 3. Atelectasis at the right lung base. 4. Emphysema.   Electronically Signed   By: Lorriane Shire M.D.   On: 07/21/2014 14:41   Dg Chest Port 1 View  07/29/2014   CLINICAL DATA:  Chest soreness, chest tube in place.  EXAM: PORTABLE CHEST - 1 VIEW  COMPARISON:  07/28/2014 and CT chest 07/26/2014.  FINDINGS: Trachea is midline. Heart size stable. Right IJ central line tip projects over the SVC. Two left chest tubes terminate in the upper left hemi thorax. No definite pneumothorax. Patchy airspace consolidation in the left upper and left lower lobes, unchanged. Minimal linear opacification at the base of the right hemi thorax. Small bilateral effusions, left greater than right.  IMPRESSION: 1. Left upper and left lower lobe pneumonia with necrotic features on 07/26/2014. 2. Small residual left pleural effusion/empyema with 2 left chest tubes in place. 3. Minimal right basilar atelectasis or pneumonia with a tiny right pleural effusion.   Electronically Signed   By: Lorin Picket M.D.   On: 07/29/2014 07:41   Dg Chest Port 1 View  07/28/2014   CLINICAL DATA:  Respiratory failure. Hyperlipidemia and hypertension.  EXAM: PORTABLE CHEST - 1 VIEW  COMPARISON:  1 day prior  FINDINGS: Right internal jugular line is unchanged, tip at low SVC. Left-sided chest tube or chest tubes unchanged in position. The previously described left apical pneumothorax has resolved. Midline trachea. Cardiomegaly accentuated by AP portable technique. Interstitial prominence is greater on the left and minimally increased. Small left pleural effusion is decreased. Improved left worse than right base airspace disease.  IMPRESSION: Resolution of left-sided pneumothorax.  Progressive interstitial edema.  Slight improvement in left worse than right base airspace disease with decreased small left pleural effusion.   Electronically Signed   By: Abigail Miyamoto M.D.   On: 07/28/2014 08:28   Dg Chest Port 1 View  07/27/2014    CLINICAL DATA:  Central line and chest tube placement  EXAM: PORTABLE CHEST - 1 VIEW  COMPARISON:  CT and radiographs earlier the same date.  FINDINGS: 1031 hours. New right IJ central venous catheter extends to the lower SVC level. Left chest tube has been placed with partial evacuation of the left pleural effusion. There is a small left apical pneumothorax. No pneumothorax is present on the right. Overall left base aeration has improved with mild left-greater-than-right basilar airspace opacities. The heart size and mediastinal contours are stable.  IMPRESSION: 1. Central line placement as described. No right-sided pneumothorax. 2. Decreased left pleural effusion following left chest tube placement. Small left apical pneumothorax.   Electronically Signed   By: Richardean Sale M.D.   On: 07/27/2014 11:03   Dg Chest Port 1 View  07/26/2014   CLINICAL DATA:  LEFT thoracentesis.  Evaluate for pneumothorax.  EXAM: PORTABLE CHEST - 1 VIEW  COMPARISON:  CT 07/26/2014.  Chest x-ray 07/23/2014.  FINDINGS: Interval decrease in the LEFT pleural effusion. No pneumothorax. Mildly improved aeration of the LEFT lung. Persistent consolidation of the LEFT lung base. Increased density at the RIGHT lung base likely representing atelectasis. Cardiopericardial silhouette is unchanged, enlarged.  IMPRESSION: No pneumothorax status post LEFT thoracentesis. Mildly improved aeration of the LEFT lung.   Electronically Signed  By: Dereck Ligas M.D.   On: 07/26/2014 15:18     CBC  Recent Labs Lab 07/25/14 0515 07/26/14 0550 07/26/14 1851 07/28/14 0400 07/29/14 0255  WBC 20.2* 18.7* 23.2* 23.0* 19.2*  HGB 10.7* 10.3* 11.1* 8.7* 9.0*  HCT 31.0* 30.0* 31.6* 25.4* 26.7*  PLT 438* 467* 529* 520* 582*  MCV 92.8 93.8 92.4 93.7 93.7  MCH 32.0 32.2 32.5 32.1 31.6  MCHC 34.5 34.3 35.1 34.3 33.7  RDW 13.3 13.5 13.7 14.1 14.2    Chemistries   Recent Labs Lab 07/25/14 0515 07/26/14 0550 07/26/14 1851 07/28/14 0400  07/29/14 0255  NA 136 133* 135 131* 135  K 2.8* 3.5 3.5 4.0 4.0  CL 94* 100* 98* 99* 101  CO2 '30 26 27 26 27  '$ GLUCOSE 129* 114* 113* 136* 94  BUN '20 18 13 12 10  '$ CREATININE 1.18 1.05 1.02 0.97 0.99  CALCIUM 8.1* 7.8* 8.2* 7.3* 7.8*  MG  --   --   --   --  2.1  AST  --   --  45*  --  34  ALT  --   --  42  --  35  ALKPHOS  --   --  105  --  92  BILITOT  --   --  0.4  --  0.6   ------------------------------------------------------------------------------------------------------------------ estimated creatinine clearance is 76.8 mL/min (by C-G formula based on Cr of 0.99). ------------------------------------------------------------------------------------------------------------------ No results for input(s): HGBA1C in the last 72 hours. ------------------------------------------------------------------------------------------------------------------ No results for input(s): CHOL, HDL, LDLCALC, TRIG, CHOLHDL, LDLDIRECT in the last 72 hours. ------------------------------------------------------------------------------------------------------------------ No results for input(s): TSH, T4TOTAL, T3FREE, THYROIDAB in the last 72 hours.  Invalid input(s): FREET3 ------------------------------------------------------------------------------------------------------------------ No results for input(s): VITAMINB12, FOLATE, FERRITIN, TIBC, IRON, RETICCTPCT in the last 72 hours.  Coagulation profile  Recent Labs Lab 07/26/14 1851  INR 1.19    No results for input(s): DDIMER in the last 72 hours.  Cardiac Enzymes No results for input(s): CKMB, TROPONINI, MYOGLOBIN in the last 168 hours.  Invalid input(s): CK ------------------------------------------------------------------------------------------------------------------ Invalid input(s): POCBNP  Shana Hornbeck, PA-S   Time Spent in minutes   25   Kelvin Cellar M.D on 07/29/2014 at 1:38 PM  Between 7am to 7pm - Pager -  [phone]  After 7pm go to [email or web address] - password [facility]  [facility]   Office  [phone]    Addendum  I personally saw and evaluated patient on 07/29/2014 and agree with above findings. Mr Lumpkin is a pleasant 64 year old gentleman with a past medical history of hypertension, gastroesophageal reflux disease, admitted to the medicine service on 07/21/2014 when he presented with complaints of fevers, chills, shortness of breath. Initial imaging studies revealed the presence of left lower lobe consolidation consistent with pneumonia. He was further worked up with a CT scan which revealed a small loculated pleural effusion. The following days patient clinically worsened with deterioration of his respiratory status. Repeat CT scan of lungs revealed moderate partially loculated left pleural effusion which radiology reported had greatly increased from prior CT. Patient undergoing thoracentesis on 07/26/2014 that yielded 425 ML's of concentrated yellow pleural fluid. Given incomplete removal cardiothoracic surgery was consulted with recommendation of VATS for drainage of empyema. Patient underwent VATS on 07/28/2014, tolerated procedure well. Cultures did not reveal acid-fast bacilli. No organisms were seen on Gram stain. On my exam today he is in no acute distress, sitting comfortably. Having good air movement, scattered rhonchi.  Chest tubes are in place. Will continue IV and microbial therapy  with IV vancomycin and Zosyn.

## 2014-07-29 NOTE — Progress Notes (Signed)
Utilization review completed.  

## 2014-07-30 ENCOUNTER — Inpatient Hospital Stay (HOSPITAL_COMMUNITY): Payer: Medicare Other

## 2014-07-30 LAB — CBC
HCT: 28 % — ABNORMAL LOW (ref 39.0–52.0)
Hemoglobin: 9.6 g/dL — ABNORMAL LOW (ref 13.0–17.0)
MCH: 31.9 pg (ref 26.0–34.0)
MCHC: 34.3 g/dL (ref 30.0–36.0)
MCV: 93 fL (ref 78.0–100.0)
PLATELETS: 736 10*3/uL — AB (ref 150–400)
RBC: 3.01 MIL/uL — ABNORMAL LOW (ref 4.22–5.81)
RDW: 14.2 % (ref 11.5–15.5)
WBC: 19.8 10*3/uL — ABNORMAL HIGH (ref 4.0–10.5)

## 2014-07-30 LAB — BASIC METABOLIC PANEL
ANION GAP: 11 (ref 5–15)
BUN: 11 mg/dL (ref 6–20)
CO2: 24 mmol/L (ref 22–32)
Calcium: 8.2 mg/dL — ABNORMAL LOW (ref 8.9–10.3)
Chloride: 99 mmol/L — ABNORMAL LOW (ref 101–111)
Creatinine, Ser: 1.11 mg/dL (ref 0.61–1.24)
GFR calc non Af Amer: >60 mL/min (ref >60)
Glucose, Bld: 81 mg/dL (ref 65–99)
Potassium: 4.4 mmol/L (ref 3.5–5.1)
Sodium: 134 mmol/L — ABNORMAL LOW (ref 135–145)

## 2014-07-30 LAB — BODY FLUID CULTURE
CULTURE: NO GROWTH
Special Requests: NORMAL

## 2014-07-30 NOTE — Progress Notes (Addendum)
      [address]       ,[place] [address]             [phone]       3 Days Post-Op Procedure(s) (LRB): VIDEO BRONCHOSCOPY (N/A) VIDEO ASSISTED THORACOSCOPY (VATS)/EMPYEMA Left, drainage pleural effusion. (Left)  Subjective: Patient with pain at chest tube sites  Objective: Vital signs in last 24 hours: Temp:  [97.7 F (36.5 C)-98.7 F (37.1 C)] 98 F (36.7 C) (06/14 0411) Pulse Rate:  [63-77] 73 (06/14 0411) Cardiac Rhythm:  [-] Normal sinus rhythm (06/13 2013) Resp:  [17-18] 18 (06/14 0411) BP: (106-117)/(68-77) 116/77 mmHg (06/14 0411) SpO2:  [94 %-95 %] 95 % (06/14 0411) Weight:  [160 lb 15 oz (73 kg)] 160 lb 15 oz (73 kg) (06/14 0411)     Intake/Output from previous day: 06/13 0701 - 06/14 0700 In: 580 [P.O.:540; I.V.:40] Out: 1350 [Urine:1350]   Physical Exam:  Cardiovascular: RRR. Pulmonary: Diminished at bases L>R; no rales, wheezes, or rhonchi. Abdomen: Soft, non tender, bowel sounds present. Extremities: Trace lower extremity edema. Wounds: Clean and dry.  No erythema or signs of infection. Chest Tubes: to water seal, no air leak  Lab Results: CBC: Recent Labs  07/29/14 0255 07/30/14 0410  WBC 19.2* 19.8*  HGB 9.0* 9.6*  HCT 26.7* 28.0*  PLT 582* 736*   BMET:  Recent Labs  07/29/14 0255 07/30/14 0410  NA 135 134*  K 4.0 4.4  CL 101 99*  CO2 27 24  GLUCOSE 94 81  BUN 10 11  CREATININE 0.99 1.11  CALCIUM 7.8* 8.2*    PT/INR: No results for input(s): LABPROT, INR in the last 72 hours. ABG:  INR: Will add last result for INR, ABG once components are confirmed Will add last 4 CBG results once components are confirmed  Assessment/Plan:  1. CV - SR in the 70's. 2.  Pulmonary - On 2 liters via Fletcher-wean as tolerates.Chest tubes are to water seal. There is no air leak. Remove one chest tube today. CXR just taken this am. Encourage incentive spirometer. 3. ID-on Vanco and Zosyn. Still with leukocytosis-WBC remains  19,800 but remains afebrile. Cultures show no WBC or organisms thus far. Re check CBC in am 4. PCA stopped. On Fentanyl PRN, Oxy,and Ultram PRN. Will stop Fentanyl IV PRN 5. Management per medicine  ZIMMERMAN,DONIELLE MPA-C 07/30/2014,7:59 AM  One chest tube out today, poss remaining tube out tomorrow I have seen and examined Shane Vasquez and agree with the above assessment  and plan.  [name] MD Beeper [phone] Office [phone] 07/30/2014 2:44 PM

## 2014-07-30 NOTE — Progress Notes (Signed)
Pt ambulated 400 feet in hall with front wheel walker and standby assist. O2 sats 93% on room air. Patient tolerated well without any dyspnea.

## 2014-07-30 NOTE — Progress Notes (Signed)
Patient Demographics:    Shane Vasquez, is a 64 y.o. male, DOB - 10-19-1950, VPX:[id number]  Admit date - 07/21/2014   Admitting Physician No admitting provider for patient encounter.  Outpatient Primary MD for the patient is [name], MD  LOS - 9   Chief Complaint  Patient presents with  . Fever  . Flank Pain        Subjective:   Shane Vasquez is a 64 y.o. male with PMH of GERD, HTN, HLD, who presented to the ED on 07/21/2014 with fever, chills and sweating for 4 days. He had a non-productive cough, and left chest pain with deep inspiration, coughing and movement. He was diagnosed with LLL PNA and on 07/28/2014 he had a VATS procedure. Today he feels his breathing is good, he produced some sputum this morning. He denies headache, fever, chills, nausea, tingling or numbness. He endorses pain at his incision site with deep inspiration and when having a bowel movement.   Assessment  & Plan :    Principal Problem:   Left lower lobe pneumonia Active Problems:   Dyslipidemia   Essential hypertension   GERD   Community acquired pneumonia   Parapneumonic effusion   CAP (community acquired pneumonia)   Empyema   Acute on chronic respiratory failure  Left lower lobe CAP with empyema - Patient underwent VATS on 07/28/2014, procedure performed by Dr. Servando Snare - 2 left chest tube placements confirmed with CXR - 1 chest tube to be removed today - WBC from 19.2 >>19.8 but afebrile - Re-check CBC tomorrow morning, appreciate Cardiothoracic progress note - Left bronchial washing culture shows Non-Pathogenic Oropharyngeal-type Flora, awaiting other cultures - Continue IV Vancomycin, Zosyn, Guaifenisin, and pain management per surgery protocol  Acute on chronic respiratory failure in setting of  CAP - O2 sats at 95% this morning, stable  - Get patient ambulating within limits of pain - Wean O2 nasal cannula as tolerated  Essential hypertension - Continue home meds and monitoring  Constipation - Patient had a semi-soft bowel movement last night - He has increase in incision pain when he has to bear down - Continue dulcolax  GERD - Continue home meds  Dyslipidemia - Continue home meds   Code Status : Full Code  Family Communication  : None  Disposition Plan  : Home when ready  Consults  :  Cardiothoracic Surgery  Procedures  : VATS  DVT Prophylaxis  :  Lovenox   Lab Results  Component Value Date   PLT 736* 07/30/2014    Inpatient Medications  Scheduled Meds: . acetaminophen  1,000 mg Oral 4 times per day   Or  . acetaminophen (TYLENOL) oral liquid 160 mg/5 mL  1,000 mg Oral 4 times per day  . bisacodyl  10 mg Oral Daily  . citalopram  20 mg Oral QHS  . enoxaparin (LOVENOX) injection  40 mg Subcutaneous Q24H  . guaiFENesin  600 mg Oral BID  . hydrochlorothiazide  25 mg Oral Daily  . piperacillin-tazobactam (ZOSYN)  IV  3.375 g Intravenous 3 times per day  . senna-docusate  1 tablet Oral QHS  . vancomycin  1,000 mg Intravenous Q12H   Continuous Infusions:  PRN Meds:.diphenhydrAMINE **OR** diphenhydrAMINE, naloxone **AND** sodium chloride, ondansetron (ZOFRAN)  IV, oxyCODONE, potassium chloride, sodium chloride, traMADol  Antibiotics  :    Anti-infectives    Start     Dose/Rate Route Frequency Ordered Stop   07/28/14 1000  azithromycin (ZITHROMAX) tablet 500 mg  Status:  Discontinued     500 mg Oral Every 24 hours 07/27/14 0831 07/27/14 1146   07/27/14 1400  piperacillin-tazobactam (ZOSYN) IVPB 3.375 g     3.375 g 12.5 mL/hr over 240 Minutes Intravenous 3 times per day 07/27/14 1155     07/27/14 1200  vancomycin (VANCOCIN) IVPB 1000 mg/200 mL premix     1,000 mg 200 mL/hr over 60 Minutes Intravenous Every 12 hours 07/27/14 1155     07/27/14 0830   azithromycin (ZITHROMAX) 500 mg in dextrose 5 % 250 mL IVPB     500 mg 250 mL/hr over 60 Minutes Intravenous To Surgery 07/27/14 0831 07/27/14 0954   07/24/14 0000  levofloxacin (LEVAQUIN) 500 MG tablet     500 mg Oral Daily 07/24/14 2152     07/22/14 1400  cefTRIAXone (ROCEPHIN) 1 g in dextrose 5 % 50 mL IVPB - Premix  Status:  Discontinued     1 g 100 mL/hr over 30 Minutes Intravenous Every 24 hours 07/21/14 1643 07/27/14 1146   07/22/14 1000  [MAR Hold]  azithromycin (ZITHROMAX) tablet 500 mg  Status:  Discontinued     (MAR Hold since 07/27/14 0630)   500 mg Oral Every 24 hours 07/21/14 1643 07/27/14 0831   07/21/14 1330  cefTRIAXone (ROCEPHIN) 1 g in dextrose 5 % 50 mL IVPB     1 g 100 mL/hr over 30 Minutes Intravenous  Once 07/21/14 1329 07/21/14 1457   07/21/14 1330  azithromycin (ZITHROMAX) 500 mg in dextrose 5 % 250 mL IVPB     500 mg 250 mL/hr over 60 Minutes Intravenous  Once 07/21/14 1329 07/21/14 1616        Objective:   Filed Vitals:   07/29/14 0454 07/29/14 1436 07/29/14 1939 07/30/14 0411  BP: 99/66 106/68 117/69 116/77  Pulse: 69 63 77 73  Temp: 97.7 F (36.5 C) 97.7 F (36.5 C) 98.7 F (37.1 C) 98 F (36.7 C)  TempSrc: Oral Oral Oral Oral  Resp: '20 17 18 18  '$ Height:      Weight:    73 kg (160 lb 15 oz)  SpO2: 93% 95% 94% 95%    Wt Readings from Last 3 Encounters:  07/30/14 73 kg (160 lb 15 oz)  02/13/14 77.111 kg (170 lb)  01/04/13 73.483 kg (162 lb)     Intake/Output Summary (Last 24 hours) at 07/30/14 0846 Last data filed at 07/30/14 0600  Gross per 24 hour  Intake    580 ml  Output   1350 ml  Net   -770 ml     Physical Exam   General: In no acute distress, appears healthy and well nourished, resting in bed and very tired today. HENT: Normocephalic, mildly dry oral mucosa without erythema or exudates. Neck: Supple, no lymphadenopathy  Cardiac: RRR, no Murmurs, no LE edema noted, no JVD, peripheral pulses palpable at 2+ Respiratory:  Mild, diffuse rhonchi through all lung fields, diminished breath sounds at bases bilaterally Abdomen: Positive bowel sounds, soft, non tender, no masses, some distention in LUQ  Skin:No rashes. Mildly erythematous, dry region above L eyebrow. Incision site not visualized, no erythema or seepage around wound cover. Extremities: Symmetric without obvious trauma or injury, intact ROM   Data Review:   Micro Results  Recent Results (from the past 240 hour(s))  Culture, blood (routine x 2)     Status: None   Collection Time: 07/21/14  4:14 PM  Result Value Ref Range Status   Specimen Description BLOOD LEFT WRIST  Final   Special Requests BOTTLES DRAWN AEROBIC AND ANAEROBIC 5CC  Final   Culture   Final    NO GROWTH 5 DAYS Performed at [facility]    Report Status 07/27/2014 FINAL  Final  Culture, blood (routine x 2)     Status: None   Collection Time: 07/21/14  4:14 PM  Result Value Ref Range Status   Specimen Description BLOOD RIGHT HAND  Final   Special Requests BOTTLES DRAWN AEROBIC AND ANAEROBIC 5CC  Final   Culture   Final    NO GROWTH 5 DAYS Performed at [facility]    Report Status 07/27/2014 FINAL  Final  Culture, sputum-assessment     Status: None   Collection Time: 07/22/14  3:05 AM  Result Value Ref Range Status   Specimen Description SPUTUM  Final   Special Requests Normal  Final   Sputum evaluation   Final    THIS SPECIMEN IS ACCEPTABLE. RESPIRATORY CULTURE REPORT TO FOLLOW.   Report Status 07/22/2014 FINAL  Final  Culture, respiratory (NON-Expectorated)     Status: None   Collection Time: 07/22/14  3:05 AM  Result Value Ref Range Status   Specimen Description SPUTUM  Final   Special Requests NONE  Final   Gram Stain   Final    MODERATE WBC PRESENT,BOTH PMN AND MONONUCLEAR RARE SQUAMOUS EPITHELIAL CELLS PRESENT FEW GRAM NEGATIVE RODS Performed at [facility]    Culture   Final    NORMAL OROPHARYNGEAL FLORA Performed at [facility]    Report Status 07/24/2014 FINAL  Final  Body fluid culture     Status: None (Preliminary result)   Collection Time: 07/26/14  3:11 PM  Result Value Ref Range Status   Specimen Description THORACENTESIS  Final   Special Requests Normal  Final   Gram Stain   Final    FEW WBC PRESENT,BOTH PMN AND MONONUCLEAR NO ORGANISMS SEEN Performed at [facility]    Culture   Final    NO GROWTH 2 DAYS Performed at [facility]    Report Status PENDING  Incomplete  Fungus Culture with Smear     Status: None (Preliminary result)   Collection Time: 07/27/14  8:03 AM  Result Value Ref Range Status   Specimen Description BRONCHIAL WASHINGS LEFT  Final   Special Requests LEFT LOWER LOBE WASHINGS BOTTLE A  Final   Fungal Smear   Final    NO YEAST OR FUNGAL ELEMENTS SEEN Performed at [facility]    Culture   Final    CULTURE IN PROGRESS FOR FOUR WEEKS Performed at [facility]    Report Status PENDING  Incomplete  Gram stain     Status: None   Collection Time: 07/27/14  8:03 AM  Result Value Ref Range Status   Specimen Description BRONCHIAL WASHINGS LEFT  Final   Special Requests LEFT LOWER LOBE WASHINGS BOTTLE A  Final   Gram Stain   Final    SPECIMEN LABELED A MODERATE WBC PRESENT, PREDOMINANTLY PMN RARE GRAM POSITIVE RODS RARE [place] CALLED RN HICKLING,K AT [phone] ON [phone] [place]    Report Status 07/27/2014 FINAL  Final  AFB culture with smear  Status: None (Preliminary result)   Collection Time: 07/27/14  8:03 AM  Result Value Ref Range Status   Specimen Description BRONCHIAL WASHINGS LEFT  Final   Special Requests LEFT LOWER LOBE WASHINGS BOTTLE A  Final   Acid Fast Smear   Final    NO ACID FAST BACILLI SEEN Performed at [facility]    Culture   Final    CULTURE WILL BE EXAMINED FOR 6 WEEKS BEFORE ISSUING A FINAL REPORT Performed at [facility]    Report Status PENDING  Incomplete  Culture,  respiratory (NON-Expectorated)     Status: None   Collection Time: 07/27/14  8:03 AM  Result Value Ref Range Status   Specimen Description BRONCHIAL WASHINGS LEFT  Final   Special Requests LEFT LOWER LOBE WASHINGS BOTTLE A  Final   Gram Stain   Final    FEW WBC PRESENT,BOTH PMN AND MONONUCLEAR NO SQUAMOUS EPITHELIAL CELLS SEEN NO ORGANISMS SEEN Performed at [facility]    Culture   Final    Non-Pathogenic Oropharyngeal-type Flora Isolated. Performed at [facility]    Report Status 07/29/2014 FINAL  Final  Anaerobic culture     Status: None (Preliminary result)   Collection Time: 07/27/14  9:04 AM  Result Value Ref Range Status   Specimen Description PLEURAL  Final   Special Requests LEFT PLEURAL FLUID BOTTLE B  Final   Gram Stain   Final    NO WBC SEEN NO SQUAMOUS EPITHELIAL CELLS SEEN NO ORGANISMS SEEN Performed at [facility]    Culture   Final    NO ANAEROBES ISOLATED; CULTURE IN PROGRESS FOR 5 DAYS Performed at [facility]    Report Status PENDING  Incomplete  Fungus Culture with Smear     Status: None (Preliminary result)   Collection Time: 07/27/14  9:04 AM  Result Value Ref Range Status   Specimen Description PLEURAL  Final   Special Requests LEFT PLEURAL FLUID BOTTLE B  Final   Fungal Smear   Final    NO YEAST OR FUNGAL ELEMENTS SEEN Performed at [facility]    Culture   Final    CULTURE IN PROGRESS FOR FOUR WEEKS Performed at [facility]    Report Status PENDING  Incomplete  Gram stain     Status: None   Collection Time: 07/27/14  9:04 AM  Result Value Ref Range Status   Specimen Description PLEURAL  Final   Special Requests LEFT PLEURAL FLUID BOTTLE B  Final   Gram Stain   Final    SPECIMEN LABELED B RARE WBC PRESENT, PREDOMINANTLY PMN NO ORGANISMS SEEN CALLED RN HICKLING,K AT [phone] ON [phone] [facility]    Report Status 07/27/2014 FINAL  Final  AFB culture with smear     Status: None (Preliminary  result)   Collection Time: 07/27/14  9:04 AM  Result Value Ref Range Status   Specimen Description PLEURAL  Final   Special Requests LEFT PLEURAL FLUID BOTTLE B  Final   Acid Fast Smear   Final    NO ACID FAST BACILLI SEEN Performed at [facility]    Culture   Final    CULTURE WILL BE EXAMINED FOR 6 WEEKS BEFORE ISSUING A FINAL REPORT Performed at [facility]    Report Status PENDING  Incomplete  Anaerobic culture     Status: None (Preliminary result)   Collection Time: 07/27/14  9:08 AM  Result Value Ref Range Status  Specimen Description TISSUE  Final   Special Requests LEFT PLEURAL PEEL BOTTLE C  Final   Gram Stain   Final    NO WBC SEEN NO SQUAMOUS EPITHELIAL CELLS SEEN NO ORGANISMS SEEN Performed at [facility]    Culture   Final    NO ANAEROBES ISOLATED; CULTURE IN PROGRESS FOR 5 DAYS Performed at [facility]    Report Status PENDING  Incomplete  Fungus Culture with Smear     Status: None (Preliminary result)   Collection Time: 07/27/14  9:08 AM  Result Value Ref Range Status   Specimen Description TISSUE  Final   Special Requests LEFT PLEURAL PEEL BOTTLE C  Final   Fungal Smear   Final    NO YEAST OR FUNGAL ELEMENTS SEEN Performed at [facility]    Culture   Final    CULTURE IN PROGRESS FOR FOUR WEEKS Performed at [facility]    Report Status PENDING  Incomplete  Gram stain     Status: None   Collection Time: 07/27/14  9:08 AM  Result Value Ref Range Status   Specimen Description TISSUE  Final   Special Requests LEFT PLEURAL PEEL BOTTLE C  Final   Gram Stain   Final    NO WBC SEEN NO ORGANISMS SEEN Performed at [facility]    Report Status 07/28/2014 FINAL  Final  AFB culture with smear     Status: None (Preliminary result)   Collection Time: 07/27/14  9:08 AM  Result Value Ref Range Status   Specimen Description TISSUE  Final   Special Requests LEFT PLEURAL PEEL BOTTLE C  Final   Acid  Fast Smear   Final    NO ACID FAST BACILLI SEEN Performed at [facility]    Culture   Final    CULTURE WILL BE EXAMINED FOR 6 WEEKS BEFORE ISSUING A FINAL REPORT Performed at [facility]    Report Status PENDING  Incomplete  MRSA PCR Screening     Status: None   Collection Time: 07/27/14 11:20 AM  Result Value Ref Range Status   MRSA by PCR NEGATIVE NEGATIVE Final    Comment:        The GeneXpert MRSA Assay (FDA approved for NASAL specimens only), is one component of a comprehensive MRSA colonization surveillance program. It is not intended to diagnose MRSA infection nor to guide or monitor treatment for MRSA infections.     Radiology Reports Dg Chest 2 View  07/23/2014   CLINICAL DATA:  Shortness of breath. Followup left lower lobe pneumonia and associated left pleural effusion.  EXAM: CHEST  2 VIEW  COMPARISON:  Two-view chest x-ray and CTA chest 07/21/2014.  FINDINGS: Since the prior examinations, interval development of moderate diffuse interstitial pulmonary edema. Cardiac silhouette mildly enlarged. Dense airspace consolidation in the left lower lobe, increased since the prior examinations. No confluent airspace consolidation elsewhere. Moderate-sized left pleural effusion and small right pleural effusion, increased on the left and new on the right. Degenerative disc disease and spondylosis throughout the thoracic and visualized upper lumbar spine.  IMPRESSION: 1. Interval development of CHF and/or fluid overload since the examinations 2 days ago, with stable mild cardiomegaly and moderate diffuse interstitial pulmonary edema. 2. Worsening pneumonia involving the left lower lobe. 3. Bilateral pleural effusions, moderate-sized on the left and small on the right.   Electronically Signed   By: Evangeline Dakin M.D.   On: 07/23/2014 15:09   Dg  Chest 2 View  07/21/2014   CLINICAL DATA:  Fever, night sweats, LEFT flank pain for 5 days, LEFT side pain with deep  breathing, history hypertension, former smoker  EXAM: CHEST  2 VIEW  COMPARISON:  None  FINDINGS: Upper normal heart size.  Mediastinal contours and pulmonary vascularity normal.  Subsegmental atelectasis RIGHT base.  LEFT lower lobe consolidation consistent with pneumonia.  Upper lungs clear.  Underlying emphysematous and bronchitic changes.  No pleural effusion or pneumothorax.  Bones demineralized.  IMPRESSION: Question COPD changes with mi subsegmental atelectasis RIGHT base.  LEFT lower lobe consolidation consistent with pneumonia.  Followup PA and lateral chest X-ray is recommended in 3-4 weeks following trial of antibiotic therapy to ensure resolution and exclude underlying malignancy.   Electronically Signed   By: Lavonia Dana M.D.   On: 07/21/2014 13:28   Ct Chest W Contrast  07/26/2014   CLINICAL DATA:  Pneumonia, cough, and shortness of breath.  EXAM: CT CHEST WITH CONTRAST  TECHNIQUE: Multidetector CT imaging of the chest was performed during intravenous contrast administration.  CONTRAST:  38m OMNIPAQUE IOHEXOL 300 MG/ML  SOLN  COMPARISON:  07/21/2014 chest CT  FINDINGS: Small mediastinal lymph nodes are similar to the prior study, most likely reactive. Heart size is within normal limits. There is no pericardial effusion.  There is a new, small right pleural effusion. Centrilobular emphysema is again seen. Minimal scarring or subsegmental atelectasis is noted in the right middle lobe, improved from prior. There is also linear scarring or subsegmental atelectasis laterally in the right lower lobe. There is a more confluent region of opacity involving the basilar right lower lobe with some air bronchograms.  Left pleural effusion has increased in size, now moderate. The effusion appears homogeneous and low in density with at most minimal pleural thickening posteriorly. The effusion is partially loculated, medially more so than laterally near the lung apex. A small amount of fluid is also noted in the  left major fissure. There is increased confluent opacity involving the majority of the left lower lobe which represents a combination of increased compressive atelectasis from the pleural effusion as well as persistent airspace filling due to the previously described pneumonia. No frank left lower lobe cavitation or abscess is identified. Mild atelectasis is present in the lingula.  Small hiatal hernia is less conspicuous than on the prior CT. No acute osseous abnormality is identified.  IMPRESSION: 1. Moderate, partially loculated left pleural effusion, greatly increased in size from prior CT. 2. Persistent extensive left lower lobe pneumonia with increased compressive atelectasis from the effusion. 3. New, small right pleural effusion. 4. Basilar right lower lobe atelectasis versus pneumonia.   Electronically Signed   By: ALogan Bores  On: 07/26/2014 12:42   Ct Angio Chest Pe W/cm &/or Wo Cm  07/21/2014   CLINICAL DATA:  Fever and left-sided flank pain. Left lower lobe pneumonia.  EXAM: CT ANGIOGRAPHY CHEST WITH CONTRAST  TECHNIQUE: Multidetector CT imaging of the chest was performed using the standard protocol during bolus administration of intravenous contrast. Multiplanar CT image reconstructions and MIPs were obtained to evaluate the vascular anatomy.  CONTRAST:  867mOMNIPAQUE IOHEXOL 350 MG/ML SOLN  COMPARISON:  Chest x-ray dated 07/21/2014  FINDINGS: There are no pulmonary emboli. There is a 7 x 6 by 6 cm area of consolidation in the left lower lobe with adjacent areas of less well-defined consolidation. There is atelectasis at the right lung base. There are several slightly enlarged lymph nodes in the  left hilum measuring 9 mm, 8 mm, and 11 mm on images 130 through 137 of series 10. Heart size is normal. Minimal coronary artery calcification.  Small left pleural effusion, loculated posterior to the superior segment of the left lower lobe. Diffuse emphysematous changes. No acute osseous abnormality.  Small hiatal hernia. The visualized portion of the upper abdomen is otherwise normal.  Review of the MIP images confirms the above findings.  IMPRESSION: 1. Extensive consolidative pneumonia in the left lower lobe with reactive left hilar adenopathy. 2. Small loculated left pleural effusion. 3. Atelectasis at the right lung base. 4. Emphysema.   Electronically Signed   By: Lorriane Shire M.D.   On: 07/21/2014 14:41   Dg Chest Port 1 View  07/30/2014   CLINICAL DATA:  Left pleural effusion.  Chest tubes.  Empyema.  EXAM: PORTABLE CHEST - 1 VIEW  COMPARISON:  Chest x-rays dated 07/29/2014 and 07/28/2014 and chest CT dated 07/26/2014 and chest x-ray dated 07/21/2014  FINDINGS: Two left-sided chest tubes and central catheter remain in good position. Tiny left apical pneumothorax. Slightly improved aeration at the left lung base with persistent areas of atelectasis and consolidation at the left base.  Heart size and vascularity are normal. Persistent areas of slight atelectasis at the right lung base.  IMPRESSION: Tiny left apical pneumothorax.  Improving aeration at the left base.   Electronically Signed   By: Lorriane Shire M.D.   On: 07/30/2014 08:41   Dg Chest Port 1 View  07/29/2014   CLINICAL DATA:  Chest soreness, chest tube in place.  EXAM: PORTABLE CHEST - 1 VIEW  COMPARISON:  07/28/2014 and CT chest 07/26/2014.  FINDINGS: Trachea is midline. Heart size stable. Right IJ central line tip projects over the SVC. Two left chest tubes terminate in the upper left hemi thorax. No definite pneumothorax. Patchy airspace consolidation in the left upper and left lower lobes, unchanged. Minimal linear opacification at the base of the right hemi thorax. Small bilateral effusions, left greater than right.  IMPRESSION: 1. Left upper and left lower lobe pneumonia with necrotic features on 07/26/2014. 2. Small residual left pleural effusion/empyema with 2 left chest tubes in place. 3. Minimal right basilar atelectasis or  pneumonia with a tiny right pleural effusion.   Electronically Signed   By: Lorin Picket M.D.   On: 07/29/2014 07:41   Dg Chest Port 1 View  07/28/2014   CLINICAL DATA:  Respiratory failure. Hyperlipidemia and hypertension.  EXAM: PORTABLE CHEST - 1 VIEW  COMPARISON:  1 day prior  FINDINGS: Right internal jugular line is unchanged, tip at low SVC. Left-sided chest tube or chest tubes unchanged in position. The previously described left apical pneumothorax has resolved. Midline trachea. Cardiomegaly accentuated by AP portable technique. Interstitial prominence is greater on the left and minimally increased. Small left pleural effusion is decreased. Improved left worse than right base airspace disease.  IMPRESSION: Resolution of left-sided pneumothorax.  Progressive interstitial edema.  Slight improvement in left worse than right base airspace disease with decreased small left pleural effusion.   Electronically Signed   By: Abigail Miyamoto M.D.   On: 07/28/2014 08:28   Dg Chest Port 1 View  07/27/2014   CLINICAL DATA:  Central line and chest tube placement  EXAM: PORTABLE CHEST - 1 VIEW  COMPARISON:  CT and radiographs earlier the same date.  FINDINGS: 1031 hours. New right IJ central venous catheter extends to the lower SVC level. Left chest tube has been placed with  partial evacuation of the left pleural effusion. There is a small left apical pneumothorax. No pneumothorax is present on the right. Overall left base aeration has improved with mild left-greater-than-right basilar airspace opacities. The heart size and mediastinal contours are stable.  IMPRESSION: 1. Central line placement as described. No right-sided pneumothorax. 2. Decreased left pleural effusion following left chest tube placement. Small left apical pneumothorax.   Electronically Signed   By: Richardean Sale M.D.   On: 07/27/2014 11:03   Dg Chest Port 1 View  07/26/2014   CLINICAL DATA:  LEFT thoracentesis.  Evaluate for pneumothorax.   EXAM: PORTABLE CHEST - 1 VIEW  COMPARISON:  CT 07/26/2014.  Chest x-ray 07/23/2014.  FINDINGS: Interval decrease in the LEFT pleural effusion. No pneumothorax. Mildly improved aeration of the LEFT lung. Persistent consolidation of the LEFT lung base. Increased density at the RIGHT lung base likely representing atelectasis. Cardiopericardial silhouette is unchanged, enlarged.  IMPRESSION: No pneumothorax status post LEFT thoracentesis. Mildly improved aeration of the LEFT lung.   Electronically Signed   By: Dereck Ligas M.D.   On: 07/26/2014 15:18     CBC  Recent Labs Lab 07/26/14 0550 07/26/14 1851 07/28/14 0400 07/29/14 0255 07/30/14 0410  WBC 18.7* 23.2* 23.0* 19.2* 19.8*  HGB 10.3* 11.1* 8.7* 9.0* 9.6*  HCT 30.0* 31.6* 25.4* 26.7* 28.0*  PLT 467* 529* 520* 582* 736*  MCV 93.8 92.4 93.7 93.7 93.0  MCH 32.2 32.5 32.1 31.6 31.9  MCHC 34.3 35.1 34.3 33.7 34.3  RDW 13.5 13.7 14.1 14.2 14.2    Chemistries   Recent Labs Lab 07/26/14 0550 07/26/14 1851 07/28/14 0400 07/29/14 0255 07/30/14 0410  NA 133* 135 131* 135 134*  K 3.5 3.5 4.0 4.0 4.4  CL 100* 98* 99* 101 99*  CO2 '26 27 26 27 24  '$ GLUCOSE 114* 113* 136* 94 81  BUN '18 13 12 10 11  '$ CREATININE 1.05 1.02 0.97 0.99 1.11  CALCIUM 7.8* 8.2* 7.3* 7.8* 8.2*  MG  --   --   --  2.1  --   AST  --  45*  --  34  --   ALT  --  42  --  35  --   ALKPHOS  --  105  --  92  --   BILITOT  --  0.4  --  0.6  --    ------------------------------------------------------------------------------------------------------------------ estimated creatinine clearance is 68.5 mL/min (by C-G formula based on Cr of 1.11). ------------------------------------------------------------------------------------------------------------------ No results for input(s): HGBA1C in the last 72 hours. ------------------------------------------------------------------------------------------------------------------ No results for input(s): CHOL, HDL, LDLCALC,  TRIG, CHOLHDL, LDLDIRECT in the last 72 hours. ------------------------------------------------------------------------------------------------------------------ No results for input(s): TSH, T4TOTAL, T3FREE, THYROIDAB in the last 72 hours.  Invalid input(s): FREET3 ------------------------------------------------------------------------------------------------------------------ No results for input(s): VITAMINB12, FOLATE, FERRITIN, TIBC, IRON, RETICCTPCT in the last 72 hours.  Coagulation profile  Recent Labs Lab 07/26/14 1851  INR 1.19    No results for input(s): DDIMER in the last 72 hours.  Cardiac Enzymes No results for input(s): CKMB, TROPONINI, MYOGLOBIN in the last 168 hours.  Invalid input(s): CK ------------------------------------------------------------------------------------------------------------------ Invalid input(s): POCBNP   Shana Hornbeck, PA-S  Time Spent in minutes   25   Kelvin Cellar M.D on 07/30/2014 at 8:46 AM  Between 7am to 7pm - Pager - [phone]  After 7pm go to [email or web address] - password [facility]  [facility]   Office  [phone]     Addendum    I personally evaluated patient on 07/30/2014 and agree with  the above findings. Formulated plan with Shana Hornbeck PA-S. In summary patient is a pleasant  64 year old gentleman with a history of hypertension admitted to the medicine service on 07/21/2014 present with complaints of fevers, chills and shortness of breath. Imaging studies showing a left lower lobe consolidation consistent with pneumonia as he was started on empiric IV and microbial therapy. Because of clinical deterioration repeat CT scan of lungs revealed moderate partially loculated pleural effusion which had significantly increased from prior CT. Pulmonary critical care medicine was consulted as he underwent thoracentesis on 07/26/2014 with incomplete drainage. Cardiothoracic surgery was consulted as he underwent VATS  procedure on 07/28/2014. Chest tubes currently in place. On physical exam he had good air movement on bilateral lungs with a decrease and rhonchi. Patient ambulated down the hallway today with physical therapy, showing daily clinical improvement. Will continue IV and microbial therapy. Follow-up on cultures.

## 2014-07-30 NOTE — Progress Notes (Signed)
Anterior chest tube removed without difficult and suture tied off. Pt tolerated well. Vital signs and respiratory status stable. Posterior chest tube remains in place connected to [place] collection container to water seal. No air leak noted.

## 2014-07-31 ENCOUNTER — Inpatient Hospital Stay (HOSPITAL_COMMUNITY): Payer: Medicare Other

## 2014-07-31 DIAGNOSIS — I10 Essential (primary) hypertension: Secondary | ICD-10-CM

## 2014-07-31 LAB — CBC
HCT: 30.1 % — ABNORMAL LOW (ref 39.0–52.0)
Hemoglobin: 10.1 g/dL — ABNORMAL LOW (ref 13.0–17.0)
MCH: 31 pg (ref 26.0–34.0)
MCHC: 33.6 g/dL (ref 30.0–36.0)
MCV: 92.3 fL (ref 78.0–100.0)
PLATELETS: 834 10*3/uL — AB (ref 150–400)
RBC: 3.26 MIL/uL — AB (ref 4.22–5.81)
RDW: 14 % (ref 11.5–15.5)
WBC: 19.9 10*3/uL — ABNORMAL HIGH (ref 4.0–10.5)

## 2014-07-31 LAB — BASIC METABOLIC PANEL
Anion gap: 11 (ref 5–15)
BUN: 13 mg/dL (ref 6–20)
CO2: 25 mmol/L (ref 22–32)
Calcium: 8.6 mg/dL — ABNORMAL LOW (ref 8.9–10.3)
Chloride: 100 mmol/L — ABNORMAL LOW (ref 101–111)
Creatinine, Ser: 1.39 mg/dL — ABNORMAL HIGH (ref 0.61–1.24)
GFR calc non Af Amer: 52 mL/min — ABNORMAL LOW (ref >60)
GFR, EST AFRICAN AMERICAN: 60 mL/min — AB (ref >60)
Glucose, Bld: 93 mg/dL (ref 65–99)
POTASSIUM: 4.2 mmol/L (ref 3.5–5.1)
SODIUM: 136 mmol/L (ref 135–145)

## 2014-07-31 LAB — VANCOMYCIN, TROUGH: VANCOMYCIN TR: 30 ug/mL — AB (ref 10.0–20.0)

## 2014-07-31 MED ORDER — VANCOMYCIN HCL IN DEXTROSE 1-5 GM/200ML-% IV SOLN
1000.0000 mg | Freq: Two times a day (BID) | INTRAVENOUS | Status: DC
  Administered 2014-07-31: 1000 mg via INTRAVENOUS
  Filled 2014-07-31 (×3): qty 200

## 2014-07-31 NOTE — Progress Notes (Signed)
SATURATION QUALIFICATIONS: (This note is used to comply with regulatory documentation for home oxygen)  Patient Saturations on Room Air at Rest = 100%  Patient Saturations on Room Air while Ambulating = 97%  Please briefly explain why patient needs home oxygen:  No o2 necessary for discharge Shane Vasquez 2:56 PM

## 2014-07-31 NOTE — Progress Notes (Signed)
CRITICAL VALUE ALERT  Critical value received:  Vancomycin trough 30  Date of notification: 07/31/14  Time of notification: 0049 Critical value read back yes   Nurse who received alert: Richarda Blade, RN  MD notified (1st page):  Schorr, [place] Time of first page:  [phone]  No new orders given

## 2014-07-31 NOTE — Discharge Instructions (Signed)
Pneumonia Pneumonia is an infection of the lungs.  CAUSES Pneumonia may be caused by bacteria or a virus. Usually, these infections are caused by breathing infectious particles into the lungs (respiratory tract). SIGNS AND SYMPTOMS   Cough.  Fever.  Chest pain.  Increased rate of breathing.  Wheezing.  Mucus production. DIAGNOSIS  If you have the common symptoms of pneumonia, your health care provider will typically confirm the diagnosis with a chest X-ray. The X-ray will show an abnormality in the lung (pulmonary infiltrate) if you have pneumonia. Other tests of your blood, urine, or sputum may be done to find the specific cause of your pneumonia. Your health care provider may also do tests (blood gases or pulse oximetry) to see how well your lungs are working. TREATMENT  Some forms of pneumonia may be spread to other people when you cough or sneeze. You may be asked to wear a mask before and during your exam. Pneumonia that is caused by bacteria is treated with antibiotic medicine. Pneumonia that is caused by the influenza virus may be treated with an antiviral medicine. Most other viral infections must run their course. These infections will not respond to antibiotics.  HOME CARE INSTRUCTIONS   Cough suppressants may be used if you are losing too much rest. However, coughing protects you by clearing your lungs. You should avoid using cough suppressants if you can.  Your health care provider may have prescribed medicine if he or she thinks your pneumonia is caused by bacteria or influenza. Finish your medicine even if you start to feel better.  Your health care provider may also prescribe an expectorant. This loosens the mucus to be coughed up.  Take medicines only as directed by your health care provider.  Do not smoke. Smoking is a common cause of bronchitis and can contribute to pneumonia. If you are a smoker and continue to smoke, your cough may last several weeks after your  pneumonia has cleared.  A cold steam vaporizer or humidifier in your room or home may help loosen mucus.  Coughing is often worse at night. Sleeping in a semi-upright position in a recliner or using a couple pillows under your head will help with this.  Get rest as you feel it is needed. Your body will usually let you know when you need to rest. PREVENTION A pneumococcal shot (vaccine) is available to prevent a common bacterial cause of pneumonia. This is usually suggested for:  People over 65 years old.  Patients on chemotherapy.  People with chronic lung problems, such as bronchitis or emphysema.  People with immune system problems. If you are over 65 or have a high risk condition, you may receive the pneumococcal vaccine if you have not received it before. In some countries, a routine influenza vaccine is also recommended. This vaccine can help prevent some cases of pneumonia.You may be offered the influenza vaccine as part of your care. If you smoke, it is time to quit. You may receive instructions on how to stop smoking. Your health care provider can provide medicines and counseling to help you quit. SEEK MEDICAL CARE IF: You have a fever. SEEK IMMEDIATE MEDICAL CARE IF:   Your illness becomes worse. This is especially true if you are elderly or weakened from any other disease.  You cannot control your cough with suppressants and are losing sleep.  You begin coughing up blood.  You develop pain which is getting worse or is uncontrolled with medicines.  Any of the symptoms   which initially brought you in for treatment are getting worse rather than better.  You develop shortness of breath or chest pain. MAKE SURE YOU:   Understand these instructions.  Will watch your condition.  Will get help right away if you are not doing well or get worse. Document Released: 02/01/2005 Document Revised: 06/18/2013 Document Reviewed: 04/23/2010 [facility] Patient Information 2015  [place], [place]. This information is not intended to replace advice given to you by your health care provider. Make sure you discuss any questions you have with your health care provider.  Thoracotomy, Care After Refer to this sheet in the next few weeks. These instructions provide you with information on caring for yourself after your procedure. Your health care provider may also give you more specific instructions. Your treatment has been planned according to current medical practices, but problems sometimes occur. Call your health care provider if you have any problems or questions after your procedure. WHAT TO EXPECT AFTER YOUR PROCEDURE After your procedure, it is typical to have the following sensations:  You may feel pain at the incision site.  You may be constipated from the pain medicine given and the change in your level of activity.  You may feel extremely tired. HOME CARE INSTRUCTIONS  Take over-the-counter or prescription medicines for pain, discomfort, or fever only as directed by your health care provider. It is very important to take pain relieving medicine before your pain becomes severe. You will be able to breathe and cough more comfortably if your pain is well controlled.  Take deep breaths. Deep breathing helps to keep your lungs inflated and protects against a lung infection (pneumonia).  Cough frequently. Even though coughing may cause discomfort, coughing is important to clear mucus (phlegm) and expand your lungs. Coughing helps prevent pneumonia. If it hurts to cough, hold a pillow against your chest when you cough. This may help with the discomfort.  Continue to use an incentive spirometer as directed. The use of an incentive spirometer helps to keep your lungs inflated and protects against pneumonia.  Change the bandages over your incision as needed or as directed by your health care provider.  Remove the bandages over your chest tube site as directed by your health  care provider.  Resume your normal diet as directed. It is important to have adequate protein, calories, vitamins, and minerals to promote healing.  Prevent constipation.  Eat high-fiber foods such as whole grain cereals and breads, brown rice, beans, and fresh fruits and vegetables.  Drink enough water and fluids to keep your urine clear or pale yellow. Avoid drinking beverages containing caffeine. Beverages containing caffeine can cause dehydration and harden your stool.  Talk to your health care provider about taking a stool softener or laxative.  Avoid lifting until you are instructed otherwise.  Do not drive until directed by your health care provider.  Do not drive while taking pain medicines (narcotics).  Do not bathe, swim, or use a hot tub until directed by your health care provider. You may shower instead. Gently wash the area of your incision with water and soap as directed. Do not use anything else to clean your incision except as directed by your health care provider.  Do not use any tobacco products including cigarettes, chewing tobacco, or electronic cigarettes.  Avoid secondhand smoke.  Schedule an appointment for stitch (suture) or staple removal as directed.  Schedule and attend all follow-up visits as directed by your health care provider. It is important to keep all  your appointments.  Participate in pulmonary rehabilitation as directed by your health care provider.  Do not travel by airplane for 2 weeks after your chest tube is removed. SEEK MEDICAL CARE IF:  You are bleeding from your wounds.  Your heartbeat seems irregular.  You have redness, swelling, or increasing pain in the wounds.  There is pus coming from your wounds.  There is a bad smell coming from the wound or dressing.  You have a fever or chills.  You have nausea or are vomiting.  You have muscle aches. SEEK IMMEDIATE MEDICAL CARE IF:  You have a rash.  You have difficulty  breathing.  You have a reaction or side effect to medicines given.  You have persistent nausea.  You have lightheadedness or feel faint.  You have shortness of breath or chest pain.  You have persistent pain. Document Released: 07/17/2010 Document Revised: 02/06/2013 Document Reviewed: 09/20/2012 [facility] Patient Information 2015 [place], [place]. This information is not intended to replace advice given to you by your health care provider. Make sure you discuss any questions you have with your health care provider.

## 2014-07-31 NOTE — Progress Notes (Signed)
[place] for Vancomycin  Indication: pneumonia  Allergies  Allergen Reactions  . Bee Venom Anaphylaxis and Nausea And Vomiting    Patient Measurements: Height: '5\' 10"'$  (177.8 cm) Weight: 160 lb 15 oz (73 kg) IBW/kg (Calculated) : 73  Vital Signs: Temp: 98.1 F (36.7 C) (06/14 2026) Temp Source: Oral (06/14 2026) BP: 120/77 mmHg (06/14 2026) Pulse Rate: 68 (06/14 2026) Intake/Output from previous day: 06/14 0701 - 06/15 0700 In: 480 [P.O.:480] Out: 1390 [Urine:1350; Chest Tube:40] Intake/Output from this shift:    Labs:  Recent Labs  07/28/14 0400 07/29/14 0255 07/30/14 0410  WBC 23.0* 19.2* 19.8*  HGB 8.7* 9.0* 9.6*  PLT 520* 582* 736*  CREATININE 0.97 0.99 1.11   Estimated Creatinine Clearance: 68.5 mL/min (by C-G formula based on Cr of 1.11).  Recent Labs  07/30/14 2308  VANCOTROUGH 30*     Microbiology: Recent Results (from the past 720 hour(s))  Culture, blood (routine x 2)     Status: None   Collection Time: 07/21/14  4:14 PM  Result Value Ref Range Status   Specimen Description BLOOD LEFT WRIST  Final   Special Requests BOTTLES DRAWN AEROBIC AND ANAEROBIC 5CC  Final   Culture   Final    NO GROWTH 5 DAYS Performed at [facility]    Report Status 07/27/2014 FINAL  Final  Culture, blood (routine x 2)     Status: None   Collection Time: 07/21/14  4:14 PM  Result Value Ref Range Status   Specimen Description BLOOD RIGHT HAND  Final   Special Requests BOTTLES DRAWN AEROBIC AND ANAEROBIC 5CC  Final   Culture   Final    NO GROWTH 5 DAYS Performed at [facility]    Report Status 07/27/2014 FINAL  Final  Culture, sputum-assessment     Status: None   Collection Time: 07/22/14  3:05 AM  Result Value Ref Range Status   Specimen Description SPUTUM  Final   Special Requests Normal  Final   Sputum evaluation   Final    THIS SPECIMEN IS ACCEPTABLE. RESPIRATORY CULTURE REPORT TO FOLLOW.   Report Status  07/22/2014 FINAL  Final  Culture, respiratory (NON-Expectorated)     Status: None   Collection Time: 07/22/14  3:05 AM  Result Value Ref Range Status   Specimen Description SPUTUM  Final   Special Requests NONE  Final   Gram Stain   Final    MODERATE WBC PRESENT,BOTH PMN AND MONONUCLEAR RARE SQUAMOUS EPITHELIAL CELLS PRESENT FEW GRAM NEGATIVE RODS Performed at [facility]    Culture   Final    NORMAL OROPHARYNGEAL FLORA Performed at [facility]    Report Status 07/24/2014 FINAL  Final  Body fluid culture     Status: None   Collection Time: 07/26/14  3:11 PM  Result Value Ref Range Status   Specimen Description THORACENTESIS  Final   Special Requests Normal  Final   Gram Stain   Final    FEW WBC PRESENT,BOTH PMN AND MONONUCLEAR NO ORGANISMS SEEN Performed at [facility]    Culture   Final    NO GROWTH 3 DAYS Performed at [facility]    Report Status 07/30/2014 FINAL  Final  Fungus Culture with Smear     Status: None (Preliminary result)   Collection Time: 07/27/14  8:03 AM  Result Value Ref Range Status   Specimen Description BRONCHIAL WASHINGS LEFT  Final   Special Requests LEFT  LOWER LOBE WASHINGS BOTTLE A  Final   Fungal Smear   Final    NO YEAST OR FUNGAL ELEMENTS SEEN Performed at [facility]    Culture   Final    CULTURE IN PROGRESS FOR FOUR WEEKS Performed at [facility]    Report Status PENDING  Incomplete  Gram stain     Status: None   Collection Time: 07/27/14  8:03 AM  Result Value Ref Range Status   Specimen Description BRONCHIAL WASHINGS LEFT  Final   Special Requests LEFT LOWER LOBE WASHINGS BOTTLE A  Final   Gram Stain   Final    SPECIMEN LABELED A MODERATE WBC PRESENT, PREDOMINANTLY PMN RARE GRAM POSITIVE RODS RARE GRAM POSITIVE COCCI CALLED RN HICKLING,K AT 1220 ON [id number] [place]    Report Status 07/27/2014 FINAL  Final  AFB culture with smear     Status: None (Preliminary result)    Collection Time: 07/27/14  8:03 AM  Result Value Ref Range Status   Specimen Description BRONCHIAL WASHINGS LEFT  Final   Special Requests LEFT LOWER LOBE WASHINGS BOTTLE A  Final   Acid Fast Smear   Final    NO ACID FAST BACILLI SEEN Performed at [facility]    Culture   Final    CULTURE WILL BE EXAMINED FOR 6 WEEKS BEFORE ISSUING A FINAL REPORT Performed at [facility]    Report Status PENDING  Incomplete  Culture, respiratory (NON-Expectorated)     Status: None   Collection Time: 07/27/14  8:03 AM  Result Value Ref Range Status   Specimen Description BRONCHIAL WASHINGS LEFT  Final   Special Requests LEFT LOWER LOBE WASHINGS BOTTLE A  Final   Gram Stain   Final    FEW WBC PRESENT,BOTH PMN AND MONONUCLEAR NO SQUAMOUS EPITHELIAL CELLS SEEN NO ORGANISMS SEEN Performed at [facility]    Culture   Final    Non-Pathogenic Oropharyngeal-type Flora Isolated. Performed at [facility]    Report Status 07/29/2014 FINAL  Final  Anaerobic culture     Status: None (Preliminary result)   Collection Time: 07/27/14  9:04 AM  Result Value Ref Range Status   Specimen Description PLEURAL  Final   Special Requests LEFT PLEURAL FLUID BOTTLE B  Final   Gram Stain   Final    NO WBC SEEN NO SQUAMOUS EPITHELIAL CELLS SEEN NO ORGANISMS SEEN Performed at [facility]    Culture   Final    NO ANAEROBES ISOLATED; CULTURE IN PROGRESS FOR 5 DAYS Performed at [facility]    Report Status PENDING  Incomplete  Fungus Culture with Smear     Status: None (Preliminary result)   Collection Time: 07/27/14  9:04 AM  Result Value Ref Range Status   Specimen Description PLEURAL  Final   Special Requests LEFT PLEURAL FLUID BOTTLE B  Final   Fungal Smear   Final    NO YEAST OR FUNGAL ELEMENTS SEEN Performed at [facility]    Culture   Final    CULTURE IN PROGRESS FOR FOUR WEEKS Performed at [facility]    Report Status PENDING   Incomplete  Gram stain     Status: None   Collection Time: 07/27/14  9:04 AM  Result Value Ref Range Status   Specimen Description PLEURAL  Final   Special Requests LEFT PLEURAL FLUID BOTTLE B  Final   Gram Stain   Final    SPECIMEN  LABELED B RARE WBC PRESENT, PREDOMINANTLY PMN NO ORGANISMS SEEN CALLED RN HICKLING,K AT 1220 ON [id number] [facility]    Report Status 07/27/2014 FINAL  Final  AFB culture with smear     Status: None (Preliminary result)   Collection Time: 07/27/14  9:04 AM  Result Value Ref Range Status   Specimen Description PLEURAL  Final   Special Requests LEFT PLEURAL FLUID BOTTLE B  Final   Acid Fast Smear   Final    NO ACID FAST BACILLI SEEN Performed at [facility]    Culture   Final    CULTURE WILL BE EXAMINED FOR 6 WEEKS BEFORE ISSUING A FINAL REPORT Performed at [facility]    Report Status PENDING  Incomplete  Anaerobic culture     Status: None (Preliminary result)   Collection Time: 07/27/14  9:08 AM  Result Value Ref Range Status   Specimen Description TISSUE  Final   Special Requests LEFT PLEURAL PEEL BOTTLE C  Final   Gram Stain   Final    NO WBC SEEN NO SQUAMOUS EPITHELIAL CELLS SEEN NO ORGANISMS SEEN Performed at [facility]    Culture   Final    NO ANAEROBES ISOLATED; CULTURE IN PROGRESS FOR 5 DAYS Performed at [facility]    Report Status PENDING  Incomplete  Fungus Culture with Smear     Status: None (Preliminary result)   Collection Time: 07/27/14  9:08 AM  Result Value Ref Range Status   Specimen Description TISSUE  Final   Special Requests LEFT PLEURAL PEEL BOTTLE C  Final   Fungal Smear   Final    NO YEAST OR FUNGAL ELEMENTS SEEN Performed at [facility]    Culture   Final    CULTURE IN PROGRESS FOR FOUR WEEKS Performed at [facility]    Report Status PENDING  Incomplete  Gram stain     Status: None   Collection Time: 07/27/14  9:08 AM  Result Value Ref Range Status    Specimen Description TISSUE  Final   Special Requests LEFT PLEURAL PEEL BOTTLE C  Final   Gram Stain   Final    NO WBC SEEN NO ORGANISMS SEEN Performed at [facility]    Report Status 07/28/2014 FINAL  Final  AFB culture with smear     Status: None (Preliminary result)   Collection Time: 07/27/14  9:08 AM  Result Value Ref Range Status   Specimen Description TISSUE  Final   Special Requests LEFT PLEURAL PEEL BOTTLE C  Final   Acid Fast Smear   Final    NO ACID FAST BACILLI SEEN Performed at [facility]    Culture   Final    CULTURE WILL BE EXAMINED FOR 6 WEEKS BEFORE ISSUING A FINAL REPORT Performed at [facility]    Report Status PENDING  Incomplete  MRSA PCR Screening     Status: None   Collection Time: 07/27/14 11:20 AM  Result Value Ref Range Status   MRSA by PCR NEGATIVE NEGATIVE Final    Comment:        The GeneXpert MRSA Assay (FDA approved for NASAL specimens only), is one component of a comprehensive MRSA colonization surveillance program. It is not intended to diagnose MRSA infection nor to guide or monitor treatment for MRSA infections.    Assessment: 64 yo male with LLL PNA s/p VATS 6/12 for empiric antibiotics  Goal of Therapy:  Vancomycin trough level  15-20 mcg/ml  Plan:  Change vancomycin 1 g IV q24h F/U renal function  Phillis Knack, PharmD, BCPS

## 2014-07-31 NOTE — Progress Notes (Addendum)
      [address]       [place] [address]             [phone]       4 Days Post-Op Procedure(s) (LRB): VIDEO BRONCHOSCOPY (N/A) VIDEO ASSISTED THORACOSCOPY (VATS)/EMPYEMA Left, drainage pleural effusion. (Left)  Subjective: Patient with pain at chest tube site  Objective: Vital signs in last 24 hours: Temp:  [97.8 F (36.6 C)-98.1 F (36.7 C)] 98 F (36.7 C) (06/15 0531) Pulse Rate:  [63-75] 75 (06/15 0531) Cardiac Rhythm:  [-] Normal sinus rhythm (06/15 0837) Resp:  [18] 18 (06/15 0531) BP: (107-145)/(63-77) 145/73 mmHg (06/15 0531) SpO2:  [89 %-95 %] 91 % (06/15 0531)     Intake/Output from previous day: 06/14 0701 - 06/15 0700 In: 480 [P.O.:480] Out: 1390 [Urine:1350; Chest Tube:40]   Physical Exam:  Cardiovascular: RRR. Pulmonary: Diminished at bases L>R; no rales, wheezes, or rhonchi. Abdomen: Soft, non tender, bowel sounds present. Extremities: No lower extremity edema. Wounds: Clean and dry.  No erythema or signs of infection. Chest Tubes: to water seal, no air leak  Lab Results: CBC:  Recent Labs  07/30/14 0410 07/31/14 0415  WBC 19.8* 19.9*  HGB 9.6* 10.1*  HCT 28.0* 30.1*  PLT 736* 834*   BMET:   Recent Labs  07/30/14 0410 07/31/14 0415  NA 134* 136  K 4.4 4.2  CL 99* 100*  CO2 24 25  GLUCOSE 81 93  BUN 11 13  CREATININE 1.11 1.39*  CALCIUM 8.2* 8.6*    PT/INR: No results for input(s): LABPROT, INR in the last 72 hours. ABG:  INR: Will add last result for INR, ABG once components are confirmed Will add last 4 CBG results once components are confirmed  Assessment/Plan:  1. CV - SR in the 70's. 2.  Pulmonary - On 2 liters via [place]-wean as tolerates.Chest tubes are to water seal and minimal output. There is no air leak. CXR shows trace left apical pneumothorax, left base consolidation and air broncho grams. Will remove remaining chest tube today.Encourage incentive spirometer. 3. ID-on Vanco and Zosyn. Still  with leukocytosis-WBC remains 19,900 but remains afebrile. Cultures show no WBC or organisms thus far.  4. PCA stopped. On Fentanyl PRN, Oxy,and Ultram PRN. Will stop Fentanyl IV PRN 5. Anemia-H and H stable at 10.1 and 30.1 6. Creatinine slightly increased from 1.11 to 1.39. On HCTZ and Vanco. Per medicine 7. Management per medicine  ZIMMERMAN,Shane Vasquez 07/31/2014,9:26 AM   I have seen and examined Shane Vasquez and agree with the above assessment  and plan.  Shane Isaac MD [facility] [phone] Office [phone] 07/31/2014 6:04 PM

## 2014-07-31 NOTE — Progress Notes (Signed)
[facility] Progress Note   Shane Vasquez:[id number] DOB: 10-26-1950 DOA: 07/21/2014 PCP: [name], MD  Brief narrative: Shane Vasquez is a 64 y.o. male with PMH of GERD, HTN, HLD, who presented to the ED on 07/21/2014 with fever, chills and sweating for 4 days. He had a non-productive cough, and left chest pain with deep inspiration, coughing and movement. He was diagnosed with LLL PNA and on 07/28/2014 he had a VATS procedure. Today he feels his breathing is good, he produced some sputum this morning. He denies headache, fever, chills, nausea, tingling or numbness. He endorses pain at his incision site with deep inspiration and when having a bowel movement.   Subjective: Having pain at CT site. No other complaints.   Assessment/Plan: Left lower lobe CAP with empyema - Patient underwent VATS on 07/28/2014, procedure performed by Dr. Servando Snare - 2 left chest tube placements confirmed with CXR - 1 chest tube to be removed on 6/14-  2nd to be removed today - WBC count unchanged at 19 for 3 days- cont to follow - Left bronchial washing culture shows Non-Pathogenic Oropharyngeal-type Flora- other cultures negative - Continue IV Vancomycin, Zosyn, Guaifenisin, and pain management per surgery protocol  Acute on chronic respiratory failure in setting of CAP - Wean O2 nasal cannula as tolerated- add incentive spirometry  Essential hypertension - Continue home meds and monitoring  Constipation - Continue dulcolax  GERD - Continue home meds  Dyslipidemia - Continue home meds   Code Status: full code Disposition Plan: home when stable DVT prophylaxis: Lovenox Consultants:CT surgery Procedures: VATS  Antibiotics: Anti-infectives    Start     Dose/Rate Route Frequency Ordered Stop   07/31/14 2200  vancomycin (VANCOCIN) IVPB 1000 mg/200 mL premix     1,000 mg 200 mL/hr over 60 Minutes Intravenous Every 12 hours 07/31/14 0304     07/28/14 1000  azithromycin  (ZITHROMAX) tablet 500 mg  Status:  Discontinued     500 mg Oral Every 24 hours 07/27/14 0831 07/27/14 1146   07/27/14 1400  piperacillin-tazobactam (ZOSYN) IVPB 3.375 g     3.375 g 12.5 mL/hr over 240 Minutes Intravenous 3 times per day 07/27/14 1155     07/27/14 1200  vancomycin (VANCOCIN) IVPB 1000 mg/200 mL premix  Status:  Discontinued     1,000 mg 200 mL/hr over 60 Minutes Intravenous Every 12 hours 07/27/14 1155 07/31/14 0304   07/27/14 0830  azithromycin (ZITHROMAX) 500 mg in dextrose 5 % 250 mL IVPB     500 mg 250 mL/hr over 60 Minutes Intravenous To Surgery 07/27/14 0831 07/27/14 0954   07/24/14 0000  levofloxacin (LEVAQUIN) 500 MG tablet     500 mg Oral Daily 07/24/14 2152     07/22/14 1400  cefTRIAXone (ROCEPHIN) 1 g in dextrose 5 % 50 mL IVPB - Premix  Status:  Discontinued     1 g 100 mL/hr over 30 Minutes Intravenous Every 24 hours 07/21/14 1643 07/27/14 1146   07/22/14 1000  [MAR Hold]  azithromycin (ZITHROMAX) tablet 500 mg  Status:  Discontinued     (MAR Hold since 07/27/14 0630)   500 mg Oral Every 24 hours 07/21/14 1643 07/27/14 0831   07/21/14 1330  cefTRIAXone (ROCEPHIN) 1 g in dextrose 5 % 50 mL IVPB     1 g 100 mL/hr over 30 Minutes Intravenous  Once 07/21/14 1329 07/21/14 1457   07/21/14 1330  azithromycin (ZITHROMAX) 500 mg in dextrose 5 % 250 mL IVPB  500 mg 250 mL/hr over 60 Minutes Intravenous  Once 07/21/14 1329 07/21/14 1616      Objective: Filed Weights   07/26/14 2020 07/27/14 1200 07/30/14 0411  Weight: 73.6 kg (162 lb 4.1 oz) 73.6 kg (162 lb 4.1 oz) 73 kg (160 lb 15 oz)    Intake/Output Summary (Last 24 hours) at 07/31/14 1222 Last data filed at 07/31/14 0800  Gross per 24 hour  Intake    600 ml  Output    940 ml  Net   -340 ml     Vitals Filed Vitals:   07/30/14 1435 07/30/14 1510 07/30/14 2026 07/31/14 0531  BP: 107/63  120/77 145/73  Pulse: 63  68 75  Temp: 97.8 F (36.6 C)  98.1 F (36.7 C) 98 F (36.7 C)  TempSrc: Oral  Oral  Oral  Resp: '18  18 18  '$ Height:      Weight:      SpO2: 95% 93% 92% 91%    Exam:  General:  Pt is alert, not in acute distress  HEENT: No icterus, No thrush, oral mucosa moist  Cardiovascular: regular rate and rhythm, S1/S2 No murmur  Respiratory: clear to auscultation bilaterally - left chest tube in place  Abdomen: Soft, +Bowel sounds, non distended, no guarding- luq tendeness (under rib cage)  MSK: No LE edema, cyanosis or clubbing  Data Reviewed: Basic Metabolic Panel:  Recent Labs Lab 07/26/14 1851 07/28/14 0400 07/29/14 0255 07/30/14 0410 07/31/14 0415  NA 135 131* 135 134* 136  K 3.5 4.0 4.0 4.4 4.2  CL 98* 99* 101 99* 100*  CO2 '27 26 27 24 25  '$ GLUCOSE 113* 136* 94 81 93  BUN '13 12 10 11 13  '$ CREATININE 1.02 0.97 0.99 1.11 1.39*  CALCIUM 8.2* 7.3* 7.8* 8.2* 8.6*  MG  --   --  2.1  --   --   PHOS  --   --  2.7  --   --    Liver Function Tests:  Recent Labs Lab 07/26/14 1510 07/26/14 1851 07/29/14 0255  AST  --  45* 34  ALT  --  42 35  ALKPHOS  --  105 92  BILITOT  --  0.4 0.6  PROT 6.4* 6.3* 5.6*  ALBUMIN  --  2.0* 1.7*   No results for input(s): LIPASE, AMYLASE in the last 168 hours. No results for input(s): AMMONIA in the last 168 hours. CBC:  Recent Labs Lab 07/26/14 1851 07/28/14 0400 07/29/14 0255 07/30/14 0410 07/31/14 0415  WBC 23.2* 23.0* 19.2* 19.8* 19.9*  HGB 11.1* 8.7* 9.0* 9.6* 10.1*  HCT 31.6* 25.4* 26.7* 28.0* 30.1*  MCV 92.4 93.7 93.7 93.0 92.3  PLT 529* 520* 582* 736* 834*   Cardiac Enzymes: No results for input(s): CKTOTAL, CKMB, CKMBINDEX, TROPONINI in the last 168 hours. BNP (last 3 results) No results for input(s): BNP in the last 8760 hours.  ProBNP (last 3 results) No results for input(s): PROBNP in the last 8760 hours.  CBG: No results for input(s): GLUCAP in the last 168 hours.  Recent Results (from the past 240 hour(s))  Culture, blood (routine x 2)     Status: None   Collection Time: 07/21/14  4:14 PM   Result Value Ref Range Status   Specimen Description BLOOD LEFT WRIST  Final   Special Requests BOTTLES DRAWN AEROBIC AND ANAEROBIC 5CC  Final   Culture   Final    NO GROWTH 5 DAYS Performed at [facility]  Report Status 07/27/2014 FINAL  Final  Culture, blood (routine x 2)     Status: None   Collection Time: 07/21/14  4:14 PM  Result Value Ref Range Status   Specimen Description BLOOD RIGHT HAND  Final   Special Requests BOTTLES DRAWN AEROBIC AND ANAEROBIC 5CC  Final   Culture   Final    NO GROWTH 5 DAYS Performed at [facility]    Report Status 07/27/2014 FINAL  Final  Culture, sputum-assessment     Status: None   Collection Time: 07/22/14  3:05 AM  Result Value Ref Range Status   Specimen Description SPUTUM  Final   Special Requests Normal  Final   Sputum evaluation   Final    THIS SPECIMEN IS ACCEPTABLE. RESPIRATORY CULTURE REPORT TO FOLLOW.   Report Status 07/22/2014 FINAL  Final  Culture, respiratory (NON-Expectorated)     Status: None   Collection Time: 07/22/14  3:05 AM  Result Value Ref Range Status   Specimen Description SPUTUM  Final   Special Requests NONE  Final   Gram Stain   Final    MODERATE WBC PRESENT,BOTH PMN AND MONONUCLEAR RARE SQUAMOUS EPITHELIAL CELLS PRESENT FEW GRAM NEGATIVE RODS Performed at [facility]    Culture   Final    NORMAL OROPHARYNGEAL FLORA Performed at [facility]    Report Status 07/24/2014 FINAL  Final  Body fluid culture     Status: None   Collection Time: 07/26/14  3:11 PM  Result Value Ref Range Status   Specimen Description THORACENTESIS  Final   Special Requests Normal  Final   Gram Stain   Final    FEW WBC PRESENT,BOTH PMN AND MONONUCLEAR NO ORGANISMS SEEN Performed at [facility]    Culture   Final    NO GROWTH 3 DAYS Performed at [facility]    Report Status 07/30/2014 FINAL  Final  Fungus Culture with Smear     Status: None (Preliminary result)    Collection Time: 07/27/14  8:03 AM  Result Value Ref Range Status   Specimen Description BRONCHIAL WASHINGS LEFT  Final   Special Requests LEFT LOWER LOBE WASHINGS BOTTLE A  Final   Fungal Smear   Final    NO YEAST OR FUNGAL ELEMENTS SEEN Performed at [facility]    Culture   Final    CULTURE IN PROGRESS FOR FOUR WEEKS Performed at [facility]    Report Status PENDING  Incomplete  Gram stain     Status: None   Collection Time: 07/27/14  8:03 AM  Result Value Ref Range Status   Specimen Description BRONCHIAL WASHINGS LEFT  Final   Special Requests LEFT LOWER LOBE WASHINGS BOTTLE A  Final   Gram Stain   Final    SPECIMEN LABELED A MODERATE WBC PRESENT, PREDOMINANTLY PMN RARE GRAM POSITIVE RODS RARE GRAM POSITIVE COCCI CALLED RN HICKLING,K AT [phone] ON [id number] [place]    Report Status 07/27/2014 FINAL  Final  AFB culture with smear     Status: None (Preliminary result)   Collection Time: 07/27/14  8:03 AM  Result Value Ref Range Status   Specimen Description BRONCHIAL WASHINGS LEFT  Final   Special Requests LEFT LOWER LOBE WASHINGS BOTTLE A  Final   Acid Fast Smear   Final    NO ACID FAST BACILLI SEEN Performed at [facility]    Culture   Final    CULTURE WILL BE EXAMINED FOR 6  WEEKS BEFORE ISSUING A FINAL REPORT Performed at [facility]    Report Status PENDING  Incomplete  Culture, respiratory (NON-Expectorated)     Status: None   Collection Time: 07/27/14  8:03 AM  Result Value Ref Range Status   Specimen Description BRONCHIAL WASHINGS LEFT  Final   Special Requests LEFT LOWER LOBE WASHINGS BOTTLE A  Final   Gram Stain   Final    FEW WBC PRESENT,BOTH PMN AND MONONUCLEAR NO SQUAMOUS EPITHELIAL CELLS SEEN NO ORGANISMS SEEN Performed at [facility]    Culture   Final    Non-Pathogenic Oropharyngeal-type Flora Isolated. Performed at [facility]    Report Status 07/29/2014 FINAL  Final  Anaerobic culture     Status:  None (Preliminary result)   Collection Time: 07/27/14  9:04 AM  Result Value Ref Range Status   Specimen Description PLEURAL  Final   Special Requests LEFT PLEURAL FLUID BOTTLE B  Final   Gram Stain   Final    NO WBC SEEN NO SQUAMOUS EPITHELIAL CELLS SEEN NO ORGANISMS SEEN Performed at [facility]    Culture   Final    NO ANAEROBES ISOLATED; CULTURE IN PROGRESS FOR 5 DAYS Performed at [facility]    Report Status PENDING  Incomplete  Fungus Culture with Smear     Status: None (Preliminary result)   Collection Time: 07/27/14  9:04 AM  Result Value Ref Range Status   Specimen Description PLEURAL  Final   Special Requests LEFT PLEURAL FLUID BOTTLE B  Final   Fungal Smear   Final    NO YEAST OR FUNGAL ELEMENTS SEEN Performed at [facility]    Culture   Final    CULTURE IN PROGRESS FOR FOUR WEEKS Performed at [facility]    Report Status PENDING  Incomplete  Gram stain     Status: None   Collection Time: 07/27/14  9:04 AM  Result Value Ref Range Status   Specimen Description PLEURAL  Final   Special Requests LEFT PLEURAL FLUID BOTTLE B  Final   Gram Stain   Final    SPECIMEN LABELED B RARE WBC PRESENT, PREDOMINANTLY PMN NO ORGANISMS SEEN CALLED RN HICKLING,K AT [id number] ON [id number] [facility]    Report Status 07/27/2014 FINAL  Final  AFB culture with smear     Status: None (Preliminary result)   Collection Time: 07/27/14  9:04 AM  Result Value Ref Range Status   Specimen Description PLEURAL  Final   Special Requests LEFT PLEURAL FLUID BOTTLE B  Final   Acid Fast Smear   Final    NO ACID FAST BACILLI SEEN Performed at [facility]    Culture   Final    CULTURE WILL BE EXAMINED FOR 6 WEEKS BEFORE ISSUING A FINAL REPORT Performed at [facility]    Report Status PENDING  Incomplete  Anaerobic culture     Status: None (Preliminary result)   Collection Time: 07/27/14  9:08 AM  Result Value Ref Range Status   Specimen  Description TISSUE  Final   Special Requests LEFT PLEURAL PEEL BOTTLE C  Final   Gram Stain   Final    NO WBC SEEN NO SQUAMOUS EPITHELIAL CELLS SEEN NO ORGANISMS SEEN Performed at [facility]    Culture   Final    NO ANAEROBES ISOLATED; CULTURE IN PROGRESS FOR 5 DAYS Performed at [facility]    Report Status PENDING  Incomplete  Fungus Culture with Smear     Status: None (Preliminary result)   Collection Time: 07/27/14  9:08 AM  Result Value Ref Range Status   Specimen Description TISSUE  Final   Special Requests LEFT PLEURAL PEEL BOTTLE C  Final   Fungal Smear   Final    NO YEAST OR FUNGAL ELEMENTS SEEN Performed at [facility]    Culture   Final    CULTURE IN PROGRESS FOR FOUR WEEKS Performed at [facility]    Report Status PENDING  Incomplete  Gram stain     Status: None   Collection Time: 07/27/14  9:08 AM  Result Value Ref Range Status   Specimen Description TISSUE  Final   Special Requests LEFT PLEURAL PEEL BOTTLE C  Final   Gram Stain   Final    NO WBC SEEN NO ORGANISMS SEEN Performed at [facility]    Report Status 07/28/2014 FINAL  Final  AFB culture with smear     Status: None (Preliminary result)   Collection Time: 07/27/14  9:08 AM  Result Value Ref Range Status   Specimen Description TISSUE  Final   Special Requests LEFT PLEURAL PEEL BOTTLE C  Final   Acid Fast Smear   Final    NO ACID FAST BACILLI SEEN Performed at [facility]    Culture   Final    CULTURE WILL BE EXAMINED FOR 6 WEEKS BEFORE ISSUING A FINAL REPORT Performed at [facility]    Report Status PENDING  Incomplete  MRSA PCR Screening     Status: None   Collection Time: 07/27/14 11:20 AM  Result Value Ref Range Status   MRSA by PCR NEGATIVE NEGATIVE Final    Comment:        The GeneXpert MRSA Assay (FDA approved for NASAL specimens only), is one component of a comprehensive MRSA colonization surveillance program. It is  not intended to diagnose MRSA infection nor to guide or monitor treatment for MRSA infections.      Studies: Dg Chest Port 1 View  07/31/2014   CLINICAL DATA:  Pneumothorax on left-sided chest pain  EXAM: PORTABLE CHEST - 1 VIEW  COMPARISON:  07/30/2014  FINDINGS: Stable small left apical pneumothorax, less than 5%. Left basilar lung opacity with air bronchograms and pleural fluid/thickening is also stable, significantly decreased from the preoperative imaging 07/23/2014. The right lung is clear. Normal heart size. Interval removal of 1 of 2 left thoracostomy tubes and the right IJ central line.  IMPRESSION: 1. Stable trace left pneumothorax after a left chest tube removal. The remaining left chest tube is in stable position. 2. Unchanged left basilar pneumonia/atelectasis with small pleural fluid/thickening.   Electronically Signed   By: Monte Fantasia M.D.   On: 07/31/2014 07:27   Dg Chest Port 1 View  07/30/2014   CLINICAL DATA:  Left pleural effusion.  Chest tubes.  Empyema.  EXAM: PORTABLE CHEST - 1 VIEW  COMPARISON:  Chest x-rays dated 07/29/2014 and 07/28/2014 and chest CT dated 07/26/2014 and chest x-ray dated 07/21/2014  FINDINGS: Two left-sided chest tubes and central catheter remain in good position. Tiny left apical pneumothorax. Slightly improved aeration at the left lung base with persistent areas of atelectasis and consolidation at the left base.  Heart size and vascularity are normal. Persistent areas of slight atelectasis at the right lung base.  IMPRESSION: Tiny left apical pneumothorax.  Improving aeration at the left base.   Electronically Signed  By: Lorriane Shire M.D.   On: 07/30/2014 08:41    Scheduled Meds:  Scheduled Meds: . acetaminophen  1,000 mg Oral 4 times per day   Or  . acetaminophen (TYLENOL) oral liquid 160 mg/5 mL  1,000 mg Oral 4 times per day  . bisacodyl  10 mg Oral Daily  . citalopram  20 mg Oral QHS  . enoxaparin (LOVENOX) injection  40 mg  Subcutaneous Q24H  . guaiFENesin  600 mg Oral BID  . hydrochlorothiazide  25 mg Oral Daily  . piperacillin-tazobactam (ZOSYN)  IV  3.375 g Intravenous 3 times per day  . senna-docusate  1 tablet Oral QHS  . vancomycin  1,000 mg Intravenous Q12H   Continuous Infusions:   Time spent on care of this patient: 59 min   [place], MD 07/31/2014, 12:22 PM  LOS: 10 days   [facility] Office  [phone] Pager - Text Page per [email or web address] If 7PM-7AM, please contact night-coverage [email or web address]

## 2014-07-31 NOTE — Progress Notes (Signed)
Chest tube removed. Kathleen Argue S 11:06 AM

## 2014-07-31 NOTE — Progress Notes (Signed)
Verbal order to remove chest tube and place order of CXR tomorrow am. [name] 8:26 AM

## 2014-08-01 ENCOUNTER — Inpatient Hospital Stay (HOSPITAL_COMMUNITY): Payer: Medicare Other

## 2014-08-01 DIAGNOSIS — J9601 Acute respiratory failure with hypoxia: Secondary | ICD-10-CM

## 2014-08-01 DIAGNOSIS — A419 Sepsis, unspecified organism: Secondary | ICD-10-CM | POA: Diagnosis not present

## 2014-08-01 DIAGNOSIS — D509 Iron deficiency anemia, unspecified: Secondary | ICD-10-CM

## 2014-08-01 LAB — CBC
HCT: 28.7 % — ABNORMAL LOW (ref 39.0–52.0)
Hemoglobin: 9.8 g/dL — ABNORMAL LOW (ref 13.0–17.0)
MCH: 31.5 pg (ref 26.0–34.0)
MCHC: 34.1 g/dL (ref 30.0–36.0)
MCV: 92.3 fL (ref 78.0–100.0)
PLATELETS: 891 10*3/uL — AB (ref 150–400)
RBC: 3.11 MIL/uL — ABNORMAL LOW (ref 4.22–5.81)
RDW: 14.1 % (ref 11.5–15.5)
WBC: 19.6 10*3/uL — ABNORMAL HIGH (ref 4.0–10.5)

## 2014-08-01 LAB — ANAEROBIC CULTURE
Gram Stain: NONE SEEN
Gram Stain: NONE SEEN

## 2014-08-01 MED ORDER — AMOXICILLIN-POT CLAVULANATE 875-125 MG PO TABS
1.0000 | ORAL_TABLET | Freq: Two times a day (BID) | ORAL | Status: DC
  Administered 2014-08-01: 1 via ORAL
  Filled 2014-08-01 (×2): qty 1

## 2014-08-01 MED ORDER — PNEUMOCOCCAL VAC POLYVALENT 25 MCG/0.5ML IJ INJ: 0.5000 mL | INJECTION | INTRAMUSCULAR | Status: DC

## 2014-08-01 MED ORDER — OXYCODONE HCL 5 MG PO TABS: 5.0000 mg | ORAL_TABLET | ORAL | Status: DC | PRN

## 2014-08-01 MED ORDER — FERROUS SULFATE 325 (65 FE) MG PO TABS: 325.0000 mg | ORAL_TABLET | Freq: Three times a day (TID) | ORAL | Status: DC

## 2014-08-01 MED ORDER — BISACODYL 5 MG PO TBEC: 10.0000 mg | DELAYED_RELEASE_TABLET | Freq: Every day | ORAL | Status: DC | PRN

## 2014-08-01 MED ORDER — SENNOSIDES-DOCUSATE SODIUM 8.6-50 MG PO TABS: 2.0000 | ORAL_TABLET | Freq: Every day | ORAL | Status: DC

## 2014-08-01 MED ORDER — AMOXICILLIN-POT CLAVULANATE 875-125 MG PO TABS: 1.0000 | ORAL_TABLET | Freq: Two times a day (BID) | ORAL | Status: DC

## 2014-08-01 MED ORDER — PNEUMOCOCCAL VAC POLYVALENT 25 MCG/0.5ML IJ INJ
0.5000 mL | INJECTION | Freq: Once | INTRAMUSCULAR | Status: AC
Start: 2014-08-01 — End: 2014-08-01
  Administered 2014-08-01: 0.5 mL via INTRAMUSCULAR
  Filled 2014-08-01: qty 0.5

## 2014-08-01 NOTE — Progress Notes (Addendum)
[place] for Vancomycin  Indication: pneumonia  Allergies  Allergen Reactions  . Bee Venom Anaphylaxis and Nausea And Vomiting    Patient Measurements: Height: '5\' 10"'$  (177.8 cm) Weight: 160 lb 15 oz (73 kg) IBW/kg (Calculated) : 73  Vital Signs: Temp: 98.5 F (36.9 C) (06/16 0510) Temp Source: Oral (06/16 0510) BP: 104/62 mmHg (06/16 0510) Pulse Rate: 73 (06/16 0510) Intake/Output from previous day: 06/15 0701 - 06/16 0700 In: 960 [P.O.:960] Out: 825 [Urine:825] Intake/Output from this shift:    Labs:  Recent Labs  07/30/14 0410 07/31/14 0415  WBC 19.8* 19.9*  HGB 9.6* 10.1*  PLT 736* 834*  CREATININE 1.11 1.39*   Estimated Creatinine Clearance: 54.7 mL/min (by C-G formula based on Cr of 1.39).  Recent Labs  07/30/14 2308  VANCOTROUGH 30*     Microbiology: Recent Results (from the past 720 hour(s))  Culture, blood (routine x 2)     Status: None   Collection Time: 07/21/14  4:14 PM  Result Value Ref Range Status   Specimen Description BLOOD LEFT WRIST  Final   Special Requests BOTTLES DRAWN AEROBIC AND ANAEROBIC 5CC  Final   Culture   Final    NO GROWTH 5 DAYS Performed at [facility]    Report Status 07/27/2014 FINAL  Final  Culture, blood (routine x 2)     Status: None   Collection Time: 07/21/14  4:14 PM  Result Value Ref Range Status   Specimen Description BLOOD RIGHT HAND  Final   Special Requests BOTTLES DRAWN AEROBIC AND ANAEROBIC 5CC  Final   Culture   Final    NO GROWTH 5 DAYS Performed at [facility]    Report Status 07/27/2014 FINAL  Final  Culture, sputum-assessment     Status: None   Collection Time: 07/22/14  3:05 AM  Result Value Ref Range Status   Specimen Description SPUTUM  Final   Special Requests Normal  Final   Sputum evaluation   Final    THIS SPECIMEN IS ACCEPTABLE. RESPIRATORY CULTURE REPORT TO FOLLOW.   Report Status 07/22/2014 FINAL  Final  Culture, respiratory  (NON-Expectorated)     Status: None   Collection Time: 07/22/14  3:05 AM  Result Value Ref Range Status   Specimen Description SPUTUM  Final   Special Requests NONE  Final   Gram Stain   Final    MODERATE WBC PRESENT,BOTH PMN AND MONONUCLEAR RARE SQUAMOUS EPITHELIAL CELLS PRESENT FEW GRAM NEGATIVE RODS Performed at [facility]    Culture   Final    NORMAL OROPHARYNGEAL FLORA Performed at [facility]    Report Status 07/24/2014 FINAL  Final  Body fluid culture     Status: None   Collection Time: 07/26/14  3:11 PM  Result Value Ref Range Status   Specimen Description THORACENTESIS  Final   Special Requests Normal  Final   Gram Stain   Final    FEW WBC PRESENT,BOTH PMN AND MONONUCLEAR NO ORGANISMS SEEN Performed at [facility]    Culture   Final    NO GROWTH 3 DAYS Performed at [facility]    Report Status 07/30/2014 FINAL  Final  Fungus Culture with Smear     Status: None (Preliminary result)   Collection Time: 07/27/14  8:03 AM  Result Value Ref Range Status   Specimen Description BRONCHIAL WASHINGS LEFT  Final   Special Requests LEFT LOWER LOBE WASHINGS BOTTLE A  Final  Fungal Smear   Final    NO YEAST OR FUNGAL ELEMENTS SEEN Performed at [facility]    Culture   Final    CULTURE IN PROGRESS FOR FOUR WEEKS Performed at [facility]    Report Status PENDING  Incomplete  Gram stain     Status: None   Collection Time: 07/27/14  8:03 AM  Result Value Ref Range Status   Specimen Description BRONCHIAL WASHINGS LEFT  Final   Special Requests LEFT LOWER LOBE WASHINGS BOTTLE A  Final   Gram Stain   Final    SPECIMEN LABELED A MODERATE WBC PRESENT, PREDOMINANTLY PMN RARE GRAM POSITIVE RODS RARE GRAM POSITIVE COCCI CALLED RN HICKLING,K AT [phone] ON [phone] N[place]    Report Status 07/27/2014 FINAL  Final  AFB culture with smear     Status: None (Preliminary result)   Collection Time: 07/27/14  8:03 AM  Result Value Ref  Range Status   Specimen Description BRONCHIAL WASHINGS LEFT  Final   Special Requests LEFT LOWER LOBE WASHINGS BOTTLE A  Final   Acid Fast Smear   Final    NO ACID FAST BACILLI SEEN Performed at [facility]    Culture   Final    CULTURE WILL BE EXAMINED FOR 6 WEEKS BEFORE ISSUING A FINAL REPORT Performed at [facility]    Report Status PENDING  Incomplete  Culture, respiratory (NON-Expectorated)     Status: None   Collection Time: 07/27/14  8:03 AM  Result Value Ref Range Status   Specimen Description BRONCHIAL WASHINGS LEFT  Final   Special Requests LEFT LOWER LOBE WASHINGS BOTTLE A  Final   Gram Stain   Final    FEW WBC PRESENT,BOTH PMN AND MONONUCLEAR NO SQUAMOUS EPITHELIAL CELLS SEEN NO ORGANISMS SEEN Performed at [facility]    Culture   Final    Non-Pathogenic Oropharyngeal-type Flora Isolated. Performed at [facility]    Report Status 07/29/2014 FINAL  Final  Anaerobic culture     Status: None (Preliminary result)   Collection Time: 07/27/14  9:04 AM  Result Value Ref Range Status   Specimen Description PLEURAL  Final   Special Requests LEFT PLEURAL FLUID BOTTLE B  Final   Gram Stain   Final    NO WBC SEEN NO SQUAMOUS EPITHELIAL CELLS SEEN NO ORGANISMS SEEN Performed at [facility]    Culture   Final    NO ANAEROBES ISOLATED; CULTURE IN PROGRESS FOR 5 DAYS Performed at [facility]    Report Status PENDING  Incomplete  Fungus Culture with Smear     Status: None (Preliminary result)   Collection Time: 07/27/14  9:04 AM  Result Value Ref Range Status   Specimen Description PLEURAL  Final   Special Requests LEFT PLEURAL FLUID BOTTLE B  Final   Fungal Smear   Final    NO YEAST OR FUNGAL ELEMENTS SEEN Performed at [facility]    Culture   Final    CULTURE IN PROGRESS FOR FOUR WEEKS Performed at [facility]    Report Status PENDING  Incomplete  Gram stain     Status: None   Collection  Time: 07/27/14  9:04 AM  Result Value Ref Range Status   Specimen Description PLEURAL  Final   Special Requests LEFT PLEURAL FLUID BOTTLE B  Final   Gram Stain   Final    SPECIMEN LABELED B RARE WBC PRESENT, PREDOMINANTLY PMN NO ORGANISMS  SEEN CALLED RN HICKLING,K AT [phone] ON [phone] MARTINB    Report Status 07/27/2014 FINAL  Final  AFB culture with smear     Status: None (Preliminary result)   Collection Time: 07/27/14  9:04 AM  Result Value Ref Range Status   Specimen Description PLEURAL  Final   Special Requests LEFT PLEURAL FLUID BOTTLE B  Final   Acid Fast Smear   Final    NO ACID FAST BACILLI SEEN Performed at [facility]    Culture   Final    CULTURE WILL BE EXAMINED FOR 6 WEEKS BEFORE ISSUING A FINAL REPORT Performed at [facility]    Report Status PENDING  Incomplete  Anaerobic culture     Status: None (Preliminary result)   Collection Time: 07/27/14  9:08 AM  Result Value Ref Range Status   Specimen Description TISSUE  Final   Special Requests LEFT PLEURAL PEEL BOTTLE C  Final   Gram Stain   Final    NO WBC SEEN NO SQUAMOUS EPITHELIAL CELLS SEEN NO ORGANISMS SEEN Performed at [facility]    Culture   Final    NO ANAEROBES ISOLATED; CULTURE IN PROGRESS FOR 5 DAYS Performed at [facility]    Report Status PENDING  Incomplete  Fungus Culture with Smear     Status: None (Preliminary result)   Collection Time: 07/27/14  9:08 AM  Result Value Ref Range Status   Specimen Description TISSUE  Final   Special Requests LEFT PLEURAL PEEL BOTTLE C  Final   Fungal Smear   Final    NO YEAST OR FUNGAL ELEMENTS SEEN Performed at [facility]    Culture   Final    CULTURE IN PROGRESS FOR FOUR WEEKS Performed at [facility]    Report Status PENDING  Incomplete  Gram stain     Status: None   Collection Time: 07/27/14  9:08 AM  Result Value Ref Range Status   Specimen Description TISSUE  Final   Special Requests LEFT  PLEURAL PEEL BOTTLE C  Final   Gram Stain   Final    NO WBC SEEN NO ORGANISMS SEEN Performed at [facility]    Report Status 07/28/2014 FINAL  Final  AFB culture with smear     Status: None (Preliminary result)   Collection Time: 07/27/14  9:08 AM  Result Value Ref Range Status   Specimen Description TISSUE  Final   Special Requests LEFT PLEURAL PEEL BOTTLE C  Final   Acid Fast Smear   Final    NO ACID FAST BACILLI SEEN Performed at [facility]    Culture   Final    CULTURE WILL BE EXAMINED FOR 6 WEEKS BEFORE ISSUING A FINAL REPORT Performed at [facility]    Report Status PENDING  Incomplete  MRSA PCR Screening     Status: None   Collection Time: 07/27/14 11:20 AM  Result Value Ref Range Status   MRSA by PCR NEGATIVE NEGATIVE Final    Comment:        The GeneXpert MRSA Assay (FDA approved for NASAL specimens only), is one component of a comprehensive MRSA colonization surveillance program. It is not intended to diagnose MRSA infection nor to guide or monitor treatment for MRSA infections.    Assessment: 64 yo M admitted 07/21/2014  to [facility] with 4d history of fever, transferred to [facility] 6/11 for left VATS for empyema.  PMH: GERD, HTN, HLD  ID:  CAP /Empyema. now afebrile, WBC 19.9 stable, remains elevated   CTX 6/5>>6/11 Azith 6/5>>6/11 Vanc 6/11>> Zosyn 6/11>>  6/11 respiratory, bronchal washings: AFB and fungal cxs ip for 4 weeks 6/11 bronchial washings: nml flora 6/10 thoracentesis: Neg, final 6/5 blood: neg 6/6 sputum: nml flora  Renal: SCr trending up  CrCl ~50-55 mL/min.   Goal of Therapy:  Vancomycin trough level 15-20 mcg/ml  Plan:  Discontinued vancomycin for now given rising SCr and current consideration of oral antibiotics with clinical improvement and negative cultures.  Spoke with Donielle, will hold off on trough until decision made about course of antibiotic therapy. F/U renal function  Thank you for allowing  pharmacy to be a part of this patients care team.  Rowe Robert Pharm.D., BCPS, AQ-Cardiology Clinical Pharmacist 08/01/2014 8:50 AM Pager: [phone] Phone: [phone]  08/01/2014 9:34 AM Plan to change to oral antibiotics, Augmentin. Will DC vancomycin & zosyn.  Shane Vasquez C

## 2014-08-01 NOTE — Progress Notes (Addendum)
Pt discharged home with wife Discharge instructions given & reviewed Education discussed  Scripts given, new order for iron ordered per MD, requested to be written in on old AVS per pt, no new updated AVS given.  IV dc'd  Tele dc'd  pna vaccine administered.  Pt discharged via wheelchair with RN All patient belongings at side.   Kathleen Argue S 1:30 PM

## 2014-08-01 NOTE — Progress Notes (Addendum)
      [address]       [place] [address]             [phone]       5 Days Post-Op Procedure(s) (LRB): VIDEO BRONCHOSCOPY (N/A) VIDEO ASSISTED THORACOSCOPY (VATS)/EMPYEMA Left, drainage pleural effusion. (Left)  Subjective: Patient sitting in chair about to eat breakfast.  He has no complaints.  Objective: Vital signs in last 24 hours: Temp:  [97.8 F (36.6 C)-99.4 F (37.4 C)] 98.5 F (36.9 C) (06/16 0510) Pulse Rate:  [60-76] 73 (06/16 0510) Cardiac Rhythm:  [-] Normal sinus rhythm (06/15 2124) Resp:  [18] 18 (06/16 0510) BP: (104-110)/(62-68) 104/62 mmHg (06/16 0510) SpO2:  [94 %-95 %] 94 % (06/16 0510)     Intake/Output from previous day: 06/15 0701 - 06/16 0700 In: 960 [P.O.:960] Out: 825 [Urine:825]   Physical Exam:  Cardiovascular: RRR. Pulmonary: Diminished at bases L>R; no rales, wheezes, or rhonchi. Abdomen: Soft, non tender, bowel sounds present. Extremities: No lower extremity edema. Wounds: Clean and dry.  No erythema or signs of infection.   Lab Results: CBC:  Recent Labs  07/30/14 0410 07/31/14 0415  WBC 19.8* 19.9*  HGB 9.6* 10.1*  HCT 28.0* 30.1*  PLT 736* 834*   BMET:   Recent Labs  07/30/14 0410 07/31/14 0415  NA 134* 136  K 4.4 4.2  CL 99* 100*  CO2 24 25  GLUCOSE 81 93  BUN 11 13  CREATININE 1.11 1.39*  CALCIUM 8.2* 8.6*    PT/INR: No results for input(s): LABPROT, INR in the last 72 hours. ABG:  INR: Will add last result for INR, ABG once components are confirmed Will add last 4 CBG results once components are confirmed  Assessment/Plan:  1. CV - SR in the 70's. 2.  Pulmonary - On room air. CXR appears to show trace left apical pneumothorax, left base consolidation improving.Encourage incentive spirometer. 3. ID-on Vanco and Zosyn. Still with leukocytosis-WBC remains 19,900 but remains afebrile. Cultures show no WBC or organisms thus far. As discussed with Dr. Prescott Gum, stop IV antibiotics  and start oral Augmentin. 5. Anemia-H and H stable at 10.1 and 30.1 6. Creatinine slightly increased from 1.11 to 1.39. On HCTZ and Vanco. Per medicine 7. Management and disposition per medicine  ZIMMERMAN,DONIELLE MPA-C 08/01/2014,7:37 AM

## 2014-08-01 NOTE — Discharge Summary (Addendum)
Physician Discharge Summary  Shane Vasquez DDU:[id number] DOB: April 16, 1950 DOA: 07/21/2014  PCP: [name], MD  Admit date: 07/21/2014 Discharge date: 08/01/2014  Time spent: 55 minutes  Recommendations for Outpatient Follow-up:  1. F/u on CBC (WBC count) in 1 wk 2. F/u on Iron levels and Hb in 1-2 months  Discharge Condition: stable Diet recommendation: low sodium, heart healthy  Discharge Diagnoses:  Principal Problem:   Left lower lobe pneumonia Active Problems:   Dyslipidemia   Essential hypertension   GERD   Community acquired pneumonia   Empyema   Anemia, iron deficiency   History of present illness:  Shane Vasquez is a 64 y.o. male with PMH of GERD, HTN, HLD, who presented to the ED on 07/21/2014 with fever, chills and sweating for 4 days. He had a non-productive cough, and left chest pain with deep inspiration, coughing and movement. He was diagnosed with LLL PNA and on 07/28/2014 he had a VATS procedure. Today he feels his breathing is good, he produced some sputum this morning. He denies headache, fever, chills, nausea, tingling or numbness. He endorses pain at his incision site with deep inspiration and when having a bowel movement.  Hospital Course:  Left lower lobe CAP with empyema- un-resolving leukocytosis - Patient underwent VATS on 07/28/2014, procedure performed by Dr. Servando Snare - 2 left chest tube placements confirmed with CXR - 1 chest tube to be removed on 6/14- 2nd removed on 6/15 - Left bronchial washing culture shows Non-Pathogenic Oropharyngeal-type Flora- other cultures negative - Has been on IV Vancomycin, Zosyn, Guaifenisin, and pain management per surgery - WBC count unchanged at 19 for 4 days- no fever- CXR clear- not hypoxic- being transitioned to Augmentin by Ct surgery for 1 more week- f/u WBC count in 1 wk  Acute respiratory failure in setting of CAP - weaned off of O2- pulse ox now 97% on room air on exertion  Anemia- Iron deficiency   Ferrous sulfate prescribed   Ref. Range 07/22/2014 11:22  Iron Latest Ref Range: 45-182 ug/dL 11 (L)  UIBC Latest Units: ug/dL 153  TIBC Latest Ref Range: 250-450 ug/dL 164 (L)  Saturation Ratios Latest Ref Range: 17.9-39.5 % 7 (L)    Essential hypertension - Continue HCTZ  Constipation - CHave ordered Dulcolax and Senna/ Docusate  Dyslipidemia - Continue Celexa  Procedures:  VATS  Consultations:  CT surgery  Discharge Exam: Filed Weights   07/26/14 2020 07/27/14 1200 07/30/14 0411  Weight: 73.6 kg (162 lb 4.1 oz) 73.6 kg (162 lb 4.1 oz) 73 kg (160 lb 15 oz)   Filed Vitals:   08/01/14 1051  BP: 110/64  Pulse: 71  Temp:   Resp:     General: AAO x 3, no distress Cardiovascular: RRR, no murmurs  Respiratory: clear to auscultation bilaterally GI: soft, non-tender, non-distended, bowel sound positive  Discharge Instructions You were cared for by a hospitalist during your hospital stay. If you have any questions about your discharge medications or the care you received while you were in the hospital after you are discharged, you can call the unit and asked to speak with the hospitalist on call if the hospitalist that took care of you is not available. Once you are discharged, your primary care physician will handle any further medical issues. Please note that NO REFILLS for any discharge medications will be authorized once you are discharged, as it is imperative that you return to your primary care physician (or establish a relationship with a primary care  physician if you do not have one) for your aftercare needs so that they can reassess your need for medications and monitor your lab values.      Discharge Instructions    Diet - low sodium heart healthy    Complete by:  As directed      Discharge instructions    Complete by:  As directed   See your family doctor in 1 wk     Increase activity slowly    Complete by:  As directed             Medication List     TAKE these medications        acetaminophen 500 MG tablet  Commonly known as:  TYLENOL  Take 1,000 mg by mouth every 6 (six) hours as needed for mild pain, moderate pain, fever or headache.     amoxicillin-clavulanate 875-125 MG per tablet  Commonly known as:  AUGMENTIN  Take 1 tablet by mouth every 12 (twelve) hours.     bisacodyl 5 MG EC tablet  Commonly known as:  DULCOLAX  Take 2 tablets (10 mg total) by mouth daily as needed for moderate constipation.     citalopram 40 MG tablet  Commonly known as:  CELEXA  Take 1 tablet (40 mg total) by mouth daily.     EPINEPHrine 0.3 mg/0.3 mL Devi  Commonly known as:  EPI-PEN  Inject 0.3 mLs (0.3 mg total) into the muscle once.     ferrous sulfate 325 (65 FE) MG tablet  Take 1 tablet (325 mg total) by mouth 3 (three) times daily with meals.     hydrochlorothiazide 25 MG tablet  Commonly known as:  HYDRODIURIL  Take 1 tablet (25 mg total) by mouth daily.     ibuprofen 200 MG tablet  Commonly known as:  ADVIL,MOTRIN  Take 200 mg by mouth every 4 (four) hours as needed for fever, headache, moderate pain or cramping.     oxyCODONE 5 MG immediate release tablet  Commonly known as:  Oxy IR/ROXICODONE  Take 1-2 tablets (5-10 mg total) by mouth every 4 (four) hours as needed for severe pain.     rosuvastatin 10 MG tablet  Commonly known as:  CRESTOR  1 tablet twice weekly     senna-docusate 8.6-50 MG per tablet  Commonly known as:  Senokot-S  Take 2 tablets by mouth at bedtime.     sildenafil 100 MG tablet  Commonly known as:  VIAGRA  Take 1 tablet (100 mg total) by mouth daily as needed. As directed       Allergies  Allergen Reactions  . Bee Venom Anaphylaxis and Nausea And Vomiting   Follow-up Information    Follow up with [name], MD.   Specialty:  Internal Medicine   Contact information:   [place] [address] [phone]       Call Faye Ramsay, MD.   Specialty:   Internal Medicine   Why:  As needed   Contact information:   [address] [employer] [place] [address] [phone]       Follow up with [name], MD On 08/22/2014.   Specialty:  Cardiothoracic Surgery   Why:  PA/LAT CXR to be taken at ([facility] which is in the same building as Dr. Everrett Coombe office) on 08/22/2014 at 12:30 pm ;Appointment time is at  1:15 pm   Contact information:   [address] [address] [place] [address]  [phone]       Follow up with Nurse On 08/07/2014.   Why:  Appointment is with nurse for chest tube suture removal only. Appointment time is at 9:30 am   Contact information:   [place] [address] [phone]      Follow up with [name], MD In 1 week.   Specialty:  Internal Medicine   Why:  you will need a CBC   Contact information:   [place] [address] [phone]        The results of significant diagnostics from this hospitalization (including imaging, microbiology, ancillary and laboratory) are listed below for reference.    Significant Diagnostic Studies: Dg Chest 2 View  08/01/2014   CLINICAL DATA:  Pneumothorax, post chest tube removal, history hypertension, hyperlipidemia  EXAM: CHEST  2 VIEW  COMPARISON:  07/31/2014  FINDINGS: Interval removal of LEFT thoracostomy tube.  Upper normal size of cardiac silhouette.  Mediastinal contours and pulmonary vascularity normal.  Emphysematous changes with persistent atelectasis versus infiltrate at LEFT base.  Persistent LEFT basilar pleural effusion and/or pleural thickening unchanged.  Central peribronchial thickening present.  Increased interstitial markings are present in the LEFT lung versus RIGHT, raising question of mild diffuse infiltrate or edema.  Tiny LEFT apex pneumothorax, little changed.  Minimal subsegmental atelectasis RIGHT base.  Bones demineralized.  IMPRESSION: Tiny LEFT apex  pneumothorax post thoracostomy tube removal, little changed.  COPD changes with persistent atelectasis versus infiltrate at LEFT lung base associated with minimal pleural effusion and/or thickening, little changed.   Electronically Signed   By: Lavonia Dana M.D.   On: 08/01/2014 08:24   Dg Chest 2 View  07/23/2014   CLINICAL DATA:  Shortness of breath. Followup left lower lobe pneumonia and associated left pleural effusion.  EXAM: CHEST  2 VIEW  COMPARISON:  Two-view chest x-ray and CTA chest 07/21/2014.  FINDINGS: Since the prior examinations, interval development of moderate diffuse interstitial pulmonary edema. Cardiac silhouette mildly enlarged. Dense airspace consolidation in the left lower lobe, increased since the prior examinations. No confluent airspace consolidation elsewhere. Moderate-sized left pleural effusion and small right pleural effusion, increased on the left and new on the right. Degenerative disc disease and spondylosis throughout the thoracic and visualized upper lumbar spine.  IMPRESSION: 1. Interval development of CHF and/or fluid overload since the examinations 2 days ago, with stable mild cardiomegaly and moderate diffuse interstitial pulmonary edema. 2. Worsening pneumonia involving the left lower lobe. 3. Bilateral pleural effusions, moderate-sized on the left and small on the right.   Electronically Signed   By: Evangeline Dakin M.D.   On: 07/23/2014 15:09   Dg Chest 2 View  07/21/2014   CLINICAL DATA:  Fever, night sweats, LEFT flank pain for 5 days, LEFT side pain with deep breathing, history hypertension, former smoker  EXAM: CHEST  2 VIEW  COMPARISON:  None  FINDINGS: Upper normal heart size.  Mediastinal contours and pulmonary vascularity normal.  Subsegmental atelectasis RIGHT base.  LEFT lower lobe consolidation consistent with pneumonia.  Upper lungs clear.  Underlying emphysematous and bronchitic changes.  No pleural effusion or pneumothorax.  Bones demineralized.   IMPRESSION: Question COPD changes with mi subsegmental atelectasis RIGHT base.  LEFT lower lobe consolidation consistent with pneumonia.  Followup PA and lateral chest X-ray is recommended in 3-4 weeks following trial of antibiotic therapy to ensure resolution and exclude underlying malignancy.   Electronically Signed   By: Lavonia Dana  M.D.   On: 07/21/2014 13:28   Ct Chest W Contrast  07/26/2014   CLINICAL DATA:  Pneumonia, cough, and shortness of breath.  EXAM: CT CHEST WITH CONTRAST  TECHNIQUE: Multidetector CT imaging of the chest was performed during intravenous contrast administration.  CONTRAST:  57m OMNIPAQUE IOHEXOL 300 MG/ML  SOLN  COMPARISON:  07/21/2014 chest CT  FINDINGS: Small mediastinal lymph nodes are similar to the prior study, most likely reactive. Heart size is within normal limits. There is no pericardial effusion.  There is a new, small right pleural effusion. Centrilobular emphysema is again seen. Minimal scarring or subsegmental atelectasis is noted in the right middle lobe, improved from prior. There is also linear scarring or subsegmental atelectasis laterally in the right lower lobe. There is a more confluent region of opacity involving the basilar right lower lobe with some air bronchograms.  Left pleural effusion has increased in size, now moderate. The effusion appears homogeneous and low in density with at most minimal pleural thickening posteriorly. The effusion is partially loculated, medially more so than laterally near the lung apex. A small amount of fluid is also noted in the left major fissure. There is increased confluent opacity involving the majority of the left lower lobe which represents a combination of increased compressive atelectasis from the pleural effusion as well as persistent airspace filling due to the previously described pneumonia. No frank left lower lobe cavitation or abscess is identified. Mild atelectasis is present in the lingula.  Small hiatal hernia  is less conspicuous than on the prior CT. No acute osseous abnormality is identified.  IMPRESSION: 1. Moderate, partially loculated left pleural effusion, greatly increased in size from prior CT. 2. Persistent extensive left lower lobe pneumonia with increased compressive atelectasis from the effusion. 3. New, small right pleural effusion. 4. Basilar right lower lobe atelectasis versus pneumonia.   Electronically Signed   By: ALogan Bores  On: 07/26/2014 12:42   Ct Angio Chest Pe W/cm &/or Wo Cm  07/21/2014   CLINICAL DATA:  Fever and left-sided flank pain. Left lower lobe pneumonia.  EXAM: CT ANGIOGRAPHY CHEST WITH CONTRAST  TECHNIQUE: Multidetector CT imaging of the chest was performed using the standard protocol during bolus administration of intravenous contrast. Multiplanar CT image reconstructions and MIPs were obtained to evaluate the vascular anatomy.  CONTRAST:  878mOMNIPAQUE IOHEXOL 350 MG/ML SOLN  COMPARISON:  Chest x-ray dated 07/21/2014  FINDINGS: There are no pulmonary emboli. There is a 7 x 6 by 6 cm area of consolidation in the left lower lobe with adjacent areas of less well-defined consolidation. There is atelectasis at the right lung base. There are several slightly enlarged lymph nodes in the left hilum measuring 9 mm, 8 mm, and 11 mm on images 130 through 137 of series 10. Heart size is normal. Minimal coronary artery calcification.  Small left pleural effusion, loculated posterior to the superior segment of the left lower lobe. Diffuse emphysematous changes. No acute osseous abnormality. Small hiatal hernia. The visualized portion of the upper abdomen is otherwise normal.  Review of the MIP images confirms the above findings.  IMPRESSION: 1. Extensive consolidative pneumonia in the left lower lobe with reactive left hilar adenopathy. 2. Small loculated left pleural effusion. 3. Atelectasis at the right lung base. 4. Emphysema.   Electronically Signed   By: JaLorriane Shire.D.   On:  07/21/2014 14:41   Dg Chest Port 1 View  07/31/2014   CLINICAL DATA:  Pneumothorax on left-sided chest  pain  EXAM: PORTABLE CHEST - 1 VIEW  COMPARISON:  07/30/2014  FINDINGS: Stable small left apical pneumothorax, less than 5%. Left basilar lung opacity with air bronchograms and pleural fluid/thickening is also stable, significantly decreased from the preoperative imaging 07/23/2014. The right lung is clear. Normal heart size. Interval removal of 1 of 2 left thoracostomy tubes and the right IJ central line.  IMPRESSION: 1. Stable trace left pneumothorax after a left chest tube removal. The remaining left chest tube is in stable position. 2. Unchanged left basilar pneumonia/atelectasis with small pleural fluid/thickening.   Electronically Signed   By: Monte Fantasia M.D.   On: 07/31/2014 07:27   Dg Chest Port 1 View  07/30/2014   CLINICAL DATA:  Left pleural effusion.  Chest tubes.  Empyema.  EXAM: PORTABLE CHEST - 1 VIEW  COMPARISON:  Chest x-rays dated 07/29/2014 and 07/28/2014 and chest CT dated 07/26/2014 and chest x-ray dated 07/21/2014  FINDINGS: Two left-sided chest tubes and central catheter remain in good position. Tiny left apical pneumothorax. Slightly improved aeration at the left lung base with persistent areas of atelectasis and consolidation at the left base.  Heart size and vascularity are normal. Persistent areas of slight atelectasis at the right lung base.  IMPRESSION: Tiny left apical pneumothorax.  Improving aeration at the left base.   Electronically Signed   By: Lorriane Shire M.D.   On: 07/30/2014 08:41   Dg Chest Port 1 View  07/29/2014   CLINICAL DATA:  Chest soreness, chest tube in place.  EXAM: PORTABLE CHEST - 1 VIEW  COMPARISON:  07/28/2014 and CT chest 07/26/2014.  FINDINGS: Trachea is midline. Heart size stable. Right IJ central line tip projects over the SVC. Two left chest tubes terminate in the upper left hemi thorax. No definite pneumothorax. Patchy airspace consolidation  in the left upper and left lower lobes, unchanged. Minimal linear opacification at the base of the right hemi thorax. Small bilateral effusions, left greater than right.  IMPRESSION: 1. Left upper and left lower lobe pneumonia with necrotic features on 07/26/2014. 2. Small residual left pleural effusion/empyema with 2 left chest tubes in place. 3. Minimal right basilar atelectasis or pneumonia with a tiny right pleural effusion.   Electronically Signed   By: Lorin Picket M.D.   On: 07/29/2014 07:41   Dg Chest Port 1 View  07/28/2014   CLINICAL DATA:  Respiratory failure. Hyperlipidemia and hypertension.  EXAM: PORTABLE CHEST - 1 VIEW  COMPARISON:  1 day prior  FINDINGS: Right internal jugular line is unchanged, tip at low SVC. Left-sided chest tube or chest tubes unchanged in position. The previously described left apical pneumothorax has resolved. Midline trachea. Cardiomegaly accentuated by AP portable technique. Interstitial prominence is greater on the left and minimally increased. Small left pleural effusion is decreased. Improved left worse than right base airspace disease.  IMPRESSION: Resolution of left-sided pneumothorax.  Progressive interstitial edema.  Slight improvement in left worse than right base airspace disease with decreased small left pleural effusion.   Electronically Signed   By: Abigail Miyamoto M.D.   On: 07/28/2014 08:28   Dg Chest Port 1 View  07/27/2014   CLINICAL DATA:  Central line and chest tube placement  EXAM: PORTABLE CHEST - 1 VIEW  COMPARISON:  CT and radiographs earlier the same date.  FINDINGS: 1031 hours. New right IJ central venous catheter extends to the lower SVC level. Left chest tube has been placed with partial evacuation of the left pleural effusion. There  is a small left apical pneumothorax. No pneumothorax is present on the right. Overall left base aeration has improved with mild left-greater-than-right basilar airspace opacities. The heart size and mediastinal  contours are stable.  IMPRESSION: 1. Central line placement as described. No right-sided pneumothorax. 2. Decreased left pleural effusion following left chest tube placement. Small left apical pneumothorax.   Electronically Signed   By: Richardean Sale M.D.   On: 07/27/2014 11:03   Dg Chest Port 1 View  07/26/2014   CLINICAL DATA:  LEFT thoracentesis.  Evaluate for pneumothorax.  EXAM: PORTABLE CHEST - 1 VIEW  COMPARISON:  CT 07/26/2014.  Chest x-ray 07/23/2014.  FINDINGS: Interval decrease in the LEFT pleural effusion. No pneumothorax. Mildly improved aeration of the LEFT lung. Persistent consolidation of the LEFT lung base. Increased density at the RIGHT lung base likely representing atelectasis. Cardiopericardial silhouette is unchanged, enlarged.  IMPRESSION: No pneumothorax status post LEFT thoracentesis. Mildly improved aeration of the LEFT lung.   Electronically Signed   By: Dereck Ligas M.D.   On: 07/26/2014 15:18    Microbiology: Recent Results (from the past 240 hour(s))  Body fluid culture     Status: None   Collection Time: 07/26/14  3:11 PM  Result Value Ref Range Status   Specimen Description THORACENTESIS  Final   Special Requests Normal  Final   Gram Stain   Final    FEW WBC PRESENT,BOTH PMN AND MONONUCLEAR NO ORGANISMS SEEN Performed at [facility]    Culture   Final    NO GROWTH 3 DAYS Performed at [facility]    Report Status 07/30/2014 FINAL  Final  Fungus Culture with Smear     Status: None (Preliminary result)   Collection Time: 07/27/14  8:03 AM  Result Value Ref Range Status   Specimen Description BRONCHIAL WASHINGS LEFT  Final   Special Requests LEFT LOWER LOBE WASHINGS BOTTLE A  Final   Fungal Smear   Final    NO YEAST OR FUNGAL ELEMENTS SEEN Performed at [facility]    Culture   Final    CULTURE IN PROGRESS FOR FOUR WEEKS Performed at [facility]    Report Status PENDING  Incomplete  Gram stain     Status: None    Collection Time: 07/27/14  8:03 AM  Result Value Ref Range Status   Specimen Description BRONCHIAL WASHINGS LEFT  Final   Special Requests LEFT LOWER LOBE WASHINGS BOTTLE A  Final   Gram Stain   Final    SPECIMEN LABELED A MODERATE WBC PRESENT, PREDOMINANTLY PMN RARE GRAM POSITIVE RODS RARE GRAM POSITIVE COCCI CALLED RN HICKLING,K AT 7829 ON [id number] [place]    Report Status 07/27/2014 FINAL  Final  AFB culture with smear     Status: None (Preliminary result)   Collection Time: 07/27/14  8:03 AM  Result Value Ref Range Status   Specimen Description BRONCHIAL WASHINGS LEFT  Final   Special Requests LEFT LOWER LOBE WASHINGS BOTTLE A  Final   Acid Fast Smear   Final    NO ACID FAST BACILLI SEEN Performed at [facility]    Culture   Final    CULTURE WILL BE EXAMINED FOR 6 WEEKS BEFORE ISSUING A FINAL REPORT Performed at [facility]    Report Status PENDING  Incomplete  Culture, respiratory (NON-Expectorated)     Status: None   Collection Time: 07/27/14  8:03 AM  Result Value Ref Range Status   Specimen  Description BRONCHIAL WASHINGS LEFT  Final   Special Requests LEFT LOWER LOBE WASHINGS BOTTLE A  Final   Gram Stain   Final    FEW WBC PRESENT,BOTH PMN AND MONONUCLEAR NO SQUAMOUS EPITHELIAL CELLS SEEN NO ORGANISMS SEEN Performed at [facility]    Culture   Final    Non-Pathogenic Oropharyngeal-type Flora Isolated. Performed at [facility]    Report Status 07/29/2014 FINAL  Final  Anaerobic culture     Status: None   Collection Time: 07/27/14  9:04 AM  Result Value Ref Range Status   Specimen Description PLEURAL  Final   Special Requests LEFT PLEURAL FLUID BOTTLE B  Final   Gram Stain   Final    NO WBC SEEN NO SQUAMOUS EPITHELIAL CELLS SEEN NO ORGANISMS SEEN Performed at [facility]    Culture   Final    NO ANAEROBES ISOLATED Performed at [facility]    Report Status 08/01/2014 FINAL  Final  Fungus Culture with  Smear     Status: None (Preliminary result)   Collection Time: 07/27/14  9:04 AM  Result Value Ref Range Status   Specimen Description PLEURAL  Final   Special Requests LEFT PLEURAL FLUID BOTTLE B  Final   Fungal Smear   Final    NO YEAST OR FUNGAL ELEMENTS SEEN Performed at [facility]    Culture   Final    CULTURE IN PROGRESS FOR FOUR WEEKS Performed at [facility]    Report Status PENDING  Incomplete  Gram stain     Status: None   Collection Time: 07/27/14  9:04 AM  Result Value Ref Range Status   Specimen Description PLEURAL  Final   Special Requests LEFT PLEURAL FLUID BOTTLE B  Final   Gram Stain   Final    SPECIMEN LABELED B RARE WBC PRESENT, PREDOMINANTLY PMN NO ORGANISMS SEEN CALLED RN HICKLING,K AT 1220 ON [id number] [facility]    Report Status 07/27/2014 FINAL  Final  AFB culture with smear     Status: None (Preliminary result)   Collection Time: 07/27/14  9:04 AM  Result Value Ref Range Status   Specimen Description PLEURAL  Final   Special Requests LEFT PLEURAL FLUID BOTTLE B  Final   Acid Fast Smear   Final    NO ACID FAST BACILLI SEEN Performed at [facility]    Culture   Final    CULTURE WILL BE EXAMINED FOR 6 WEEKS BEFORE ISSUING A FINAL REPORT Performed at [facility]    Report Status PENDING  Incomplete  Anaerobic culture     Status: None   Collection Time: 07/27/14  9:08 AM  Result Value Ref Range Status   Specimen Description TISSUE  Final   Special Requests LEFT PLEURAL PEEL BOTTLE C  Final   Gram Stain   Final    NO WBC SEEN NO SQUAMOUS EPITHELIAL CELLS SEEN NO ORGANISMS SEEN Performed at [facility]    Culture   Final    NO ANAEROBES ISOLATED Performed at [facility]    Report Status 08/01/2014 FINAL  Final  Fungus Culture with Smear     Status: None (Preliminary result)   Collection Time: 07/27/14  9:08 AM  Result Value Ref Range Status   Specimen Description TISSUE  Final   Special  Requests LEFT PLEURAL PEEL BOTTLE C  Final   Fungal Smear   Final    NO YEAST OR FUNGAL ELEMENTS  SEEN Performed at [facility]   Final    CULTURE IN PROGRESS FOR FOUR WEEKS Performed at [facility]    Report Status PENDING  Incomplete  Gram stain     Status: None   Collection Time: 07/27/14  9:08 AM  Result Value Ref Range Status   Specimen Description TISSUE  Final   Special Requests LEFT PLEURAL PEEL BOTTLE C  Final   Gram Stain   Final    NO WBC SEEN NO ORGANISMS SEEN Performed at [facility]    Report Status 07/28/2014 FINAL  Final  AFB culture with smear     Status: None (Preliminary result)   Collection Time: 07/27/14  9:08 AM  Result Value Ref Range Status   Specimen Description TISSUE  Final   Special Requests LEFT PLEURAL PEEL BOTTLE C  Final   Acid Fast Smear   Final    NO ACID FAST BACILLI SEEN Performed at [facility]    Culture   Final    CULTURE WILL BE EXAMINED FOR 6 WEEKS BEFORE ISSUING A FINAL REPORT Performed at [facility]    Report Status PENDING  Incomplete  MRSA PCR Screening     Status: None   Collection Time: 07/27/14 11:20 AM  Result Value Ref Range Status   MRSA by PCR NEGATIVE NEGATIVE Final    Comment:        The GeneXpert MRSA Assay (FDA approved for NASAL specimens only), is one component of a comprehensive MRSA colonization surveillance program. It is not intended to diagnose MRSA infection nor to guide or monitor treatment for MRSA infections.      Labs: Basic Metabolic Panel:  Recent Labs Lab 07/26/14 1851 07/28/14 0400 07/29/14 0255 07/30/14 0410 07/31/14 0415  NA 135 131* 135 134* 136  K 3.5 4.0 4.0 4.4 4.2  CL 98* 99* 101 99* 100*  CO2 '27 26 27 24 25  '$ GLUCOSE 113* 136* 94 81 93  BUN '13 12 10 11 13  '$ CREATININE 1.02 0.97 0.99 1.11 1.39*  CALCIUM 8.2* 7.3* 7.8* 8.2* 8.6*  MG  --   --  2.1  --   --   PHOS  --   --  2.7  --   --    Liver Function  Tests:  Recent Labs Lab 07/26/14 1510 07/26/14 1851 07/29/14 0255  AST  --  45* 34  ALT  --  42 35  ALKPHOS  --  105 92  BILITOT  --  0.4 0.6  PROT 6.4* 6.3* 5.6*  ALBUMIN  --  2.0* 1.7*   No results for input(s): LIPASE, AMYLASE in the last 168 hours. No results for input(s): AMMONIA in the last 168 hours. CBC:  Recent Labs Lab 07/28/14 0400 07/29/14 0255 07/30/14 0410 07/31/14 0415 08/01/14 0818  WBC 23.0* 19.2* 19.8* 19.9* 19.6*  HGB 8.7* 9.0* 9.6* 10.1* 9.8*  HCT 25.4* 26.7* 28.0* 30.1* 28.7*  MCV 93.7 93.7 93.0 92.3 92.3  PLT 520* 582* 736* 834* 891*   Cardiac Enzymes: No results for input(s): CKTOTAL, CKMB, CKMBINDEX, TROPONINI in the last 168 hours. BNP: BNP (last 3 results) No results for input(s): BNP in the last 8760 hours.  ProBNP (last 3 results) No results for input(s): PROBNP in the last 8760 hours.  CBG: No results for input(s): GLUCAP in the last 168 hours.     SignedDebbe Odea, MD [facility] 08/01/2014, 12:37 PM

## 2014-08-01 NOTE — Progress Notes (Signed)
Utilization review completed.  

## 2014-08-07 ENCOUNTER — Other Ambulatory Visit: Payer: Self-pay | Admitting: *Deleted

## 2014-08-07 ENCOUNTER — Ambulatory Visit: Payer: Medicare Other | Admitting: *Deleted

## 2014-08-07 DIAGNOSIS — J869 Pyothorax without fistula: Secondary | ICD-10-CM

## 2014-08-07 DIAGNOSIS — Z4802 Encounter for removal of sutures: Secondary | ICD-10-CM

## 2014-08-07 DIAGNOSIS — G8918 Other acute postprocedural pain: Secondary | ICD-10-CM

## 2014-08-07 MED ORDER — OXYCODONE HCL 5 MG PO TABS: 5.0000 mg | ORAL_TABLET | ORAL | Status: DC | PRN

## 2014-08-07 NOTE — Progress Notes (Signed)
Mr. Decola returns s/p LVATS for drainage of an empyema.  The mini thoracotomy incision is well healed with glue remaining intact. The two chest tube sites are well healed and four sutures were easily removed. Bowels, diet are good. He is using the pain medication and a refill was given. An earlier return appointment was made per his request so that he can be released to go on a two week scheduled vacation made prior to the surgery.

## 2014-08-08 ENCOUNTER — Encounter: Payer: Self-pay | Admitting: Internal Medicine

## 2014-08-08 ENCOUNTER — Ambulatory Visit (INDEPENDENT_AMBULATORY_CARE_PROVIDER_SITE_OTHER): Payer: Medicare Other | Admitting: Internal Medicine

## 2014-08-08 VITALS — BP 128/70 | HR 67 | Temp 98.7°F | Resp 20 | Ht 70.0 in | Wt 154.0 lb

## 2014-08-08 DIAGNOSIS — J189 Pneumonia, unspecified organism: Secondary | ICD-10-CM | POA: Diagnosis not present

## 2014-08-08 DIAGNOSIS — D638 Anemia in other chronic diseases classified elsewhere: Secondary | ICD-10-CM | POA: Diagnosis not present

## 2014-08-08 DIAGNOSIS — I10 Essential (primary) hypertension: Secondary | ICD-10-CM | POA: Diagnosis not present

## 2014-08-08 DIAGNOSIS — J869 Pyothorax without fistula: Secondary | ICD-10-CM

## 2014-08-08 DIAGNOSIS — J181 Lobar pneumonia, unspecified organism: Principal | ICD-10-CM

## 2014-08-08 LAB — CBC WITH DIFFERENTIAL/PLATELET
BASOS ABS: 0.1 10*3/uL (ref 0.0–0.1)
BASOS PCT: 0.4 % (ref 0.0–3.0)
EOS ABS: 0.1 10*3/uL (ref 0.0–0.7)
Eosinophils Relative: 0.7 % (ref 0.0–5.0)
HCT: 29.5 % — ABNORMAL LOW (ref 39.0–52.0)
Hemoglobin: 9.7 g/dL — ABNORMAL LOW (ref 13.0–17.0)
LYMPHS PCT: 20.5 % (ref 12.0–46.0)
Lymphs Abs: 3.1 10*3/uL (ref 0.7–4.0)
MCHC: 33 g/dL (ref 30.0–36.0)
MCV: 94.5 fl (ref 78.0–100.0)
Monocytes Absolute: 1.4 10*3/uL — ABNORMAL HIGH (ref 0.1–1.0)
Monocytes Relative: 9.3 % (ref 3.0–12.0)
Neutro Abs: 10.3 10*3/uL — ABNORMAL HIGH (ref 1.4–7.7)
Neutrophils Relative %: 69.1 % (ref 43.0–77.0)
Platelets: 873 10*3/uL — ABNORMAL HIGH (ref 150.0–400.0)
RBC: 3.12 Mil/uL — AB (ref 4.22–5.81)
RDW: 13.8 % (ref 11.5–15.5)
WBC: 14.9 10*3/uL — ABNORMAL HIGH (ref 4.0–10.5)

## 2014-08-08 MED ORDER — EPINEPHRINE 0.3 MG/0.3ML IJ SOAJ: 0.3000 mg | Freq: Once | INTRAMUSCULAR | Status: AC

## 2014-08-08 NOTE — Progress Notes (Signed)
Subjective:    Patient ID: Shane Vasquez, male    DOB: 1950-11-07, 64 y.o.   MRN: [id number]  HPI PCP: [name], MD  Admit date: 07/21/2014 Discharge date: 08/01/2014   Recommendations for Outpatient Follow-up:  1. F/u on CBC (WBC count) in 1 wk 2. F/u on Iron levels and Hb in 1-2 months  Discharge Condition: stable Diet recommendation: low sodium, heart healthy  Discharge Diagnoses:  Principal Problem:  Left lower lobe pneumonia Active Problems:  Dyslipidemia  Essential hypertension  GERD  Community acquired pneumonia  Empyema  Anemia, iron deficiency  SURGICAL PROCEDURE: Bronchoscopy with bronchial washings and culture. Left video-assisted thoracoscopy with drainage of empyema and decortication.    64 year old patient who is seen following a recent hospital discharge.  7 days ago.  He was seen in follow-up yesterday and had staples removed from a prior left-sided thoracotomy.  He had empyema complicating left lower lobe pneumonia.  He has completed antibiotic therapy and has improved Feels that he is progressing nicely post hospital admission.  No recurrent fever.  No chest pain  He has a history of essential hypertension and remains on hydrochlorothiazide  Hospital records reviewed  Past Medical History  Diagnosis Date  . CERVICAL RADICULOPATHY, RIGHT 08/12/2009  . DIVERTICULOSIS, COLON 08/15/2006  . GERD 08/15/2006  . HYPERLIPIDEMIA 08/15/2006  . HYPERTENSION 08/15/2006  . INTERNAL HEMORRHOIDS 02/29/2008  . SKIN CANCER, HX OF 08/15/2006    History   Social History  . Marital Status: Married    Spouse Name: N/A  . Number of Children: N/A  . Years of Education: N/A   Occupational History  . Not on file.   Social History Main Topics  . Smoking status: Former Smoker -- 1.00 packs/day    Types: Cigarettes    Quit date: 08/29/2012  . Smokeless tobacco: Never Used  . Alcohol Use: Yes  . Drug Use: No  . Sexual Activity: Not on file    Other Topics Concern  . Not on file   Social History Narrative    Past Surgical History  Procedure Laterality Date  . Tonsillectomy    . Video bronchoscopy N/A 07/27/2014    Procedure: VIDEO BRONCHOSCOPY;  Surgeon: [name], MD;  Location: [facility] OR;  Service: Thoracic;  Laterality: N/A;  . Video assisted thoracoscopy (vats)/empyema Left 07/27/2014    Procedure: VIDEO ASSISTED THORACOSCOPY (VATS)/EMPYEMA Left, drainage pleural effusion.;  Surgeon: [name], MD;  Location: [facility] OR;  Service: Thoracic;  Laterality: Left;    Family History  Problem Relation Age of Onset  . Osteoporosis Mother   . COPD Father     Allergies  Allergen Reactions  . Bee Venom Anaphylaxis and Nausea And Vomiting    Current Outpatient Prescriptions on File Prior to Visit  Medication Sig Dispense Refill  . acetaminophen (TYLENOL) 500 MG tablet Take 1,000 mg by mouth every 6 (six) hours as needed for mild pain, moderate pain, fever or headache.    . citalopram (CELEXA) 40 MG tablet Take 1 tablet (40 mg total) by mouth daily. (Patient taking differently: Take 20 mg by mouth daily. ) 90 tablet 3  . ferrous sulfate 325 (65 FE) MG tablet Take 1 tablet (325 mg total) by mouth 3 (three) times daily with meals. 90 tablet 0  . hydrochlorothiazide (HYDRODIURIL) 25 MG tablet Take 1 tablet (25 mg total) by mouth daily. 90 tablet 3  . ibuprofen (ADVIL,MOTRIN) 200 MG tablet Take 200 mg by mouth every 4 (four) hours  as needed for fever, headache, moderate pain or cramping.     No current facility-administered medications on file prior to visit.    BP 128/70 mmHg  Pulse 67  Temp(Src) 98.7 F (37.1 C) (Oral)  Resp 20  Ht '5\' 10"'$  (1.778 m)  Wt 154 lb (69.854 kg)  BMI 22.10 kg/m2  SpO2 98%      Review of Systems  Constitutional: Positive for activity change, appetite change and fatigue. Negative for fever and chills.  HENT: Negative for congestion, dental problem, ear pain, hearing loss, sore  throat, tinnitus, trouble swallowing and voice change.   Eyes: Negative for pain, discharge and visual disturbance.  Respiratory: Negative for cough, chest tightness, wheezing and stridor.   Cardiovascular: Negative for chest pain, palpitations and leg swelling.  Gastrointestinal: Negative for nausea, vomiting, abdominal pain, diarrhea, constipation, blood in stool and abdominal distention.  Genitourinary: Negative for urgency, hematuria, flank pain, discharge, difficulty urinating and genital sores.  Musculoskeletal: Negative for myalgias, back pain, joint swelling, arthralgias, gait problem and neck stiffness.  Skin: Negative for rash.  Neurological: Negative for dizziness, syncope, speech difficulty, weakness, numbness and headaches.  Hematological: Negative for adenopathy. Does not bruise/bleed easily.  Psychiatric/Behavioral: Negative for behavioral problems and dysphoric mood. The patient is not nervous/anxious.        Objective:   Physical Exam  Constitutional: He is oriented to person, place, and time. He appears well-developed.  HENT:  Head: Normocephalic.  Right Ear: External ear normal.  Left Ear: External ear normal.  Eyes: Conjunctivae and EOM are normal.  Neck: Normal range of motion.  Cardiovascular: Normal rate and normal heart sounds.   Pulmonary/Chest: Effort normal. No respiratory distress. He has no wheezes. He has no rales.  Mildly diminished breath sounds left lower lung  Healing small thoracotomy scars  Abdominal: Bowel sounds are normal.  Musculoskeletal: Normal range of motion. He exhibits no edema or tenderness.  Neurological: He is alert and oriented to person, place, and time.  Psychiatric: He has a normal mood and affect. His behavior is normal.          Assessment & Plan:   Status post left lower lobe pneumonia, kidney by empyema s/p VATS Anemia of chronic disease Hypertension  We'll check a CBC.  Continue to slowly increase his activity  level We'll hold hydrochlorothiazide and continue home blood pressure monitoring.  Will resume if blood pressure is consistently greater than 140 over 90  Recheck 3 months or as needed

## 2014-08-08 NOTE — Progress Notes (Signed)
Pre visit review using our clinic review tool, if applicable. No additional management support is needed unless otherwise documented below in the visit note. 

## 2014-08-08 NOTE — Patient Instructions (Signed)
Please check your blood pressure on a regular basis.  If it is consistently greater than 140/90, please resume hydrochlorothiazide    It is important that you exercise regularly, at least 20 minutes 3 to 4 times per week.  If you develop chest pain or shortness of breath seek  medical attention.  Return in 3 months for follow-up

## 2014-08-09 ENCOUNTER — Other Ambulatory Visit: Payer: Self-pay | Admitting: Cardiothoracic Surgery

## 2014-08-09 DIAGNOSIS — J181 Lobar pneumonia, unspecified organism: Principal | ICD-10-CM

## 2014-08-09 DIAGNOSIS — J189 Pneumonia, unspecified organism: Secondary | ICD-10-CM

## 2014-08-12 ENCOUNTER — Ambulatory Visit (INDEPENDENT_AMBULATORY_CARE_PROVIDER_SITE_OTHER): Payer: Self-pay | Admitting: Physician Assistant

## 2014-08-12 ENCOUNTER — Ambulatory Visit
Admission: RE | Admit: 2014-08-12 | Discharge: 2014-08-12 | Disposition: A | Discharge status: Home / Self Care | Payer: Medicare Other | Source: Ambulatory Visit | Attending: Cardiothoracic Surgery | Admitting: Cardiothoracic Surgery

## 2014-08-12 VITALS — BP 116/68 | HR 77 | Resp 16 | Ht 70.0 in | Wt 154.0 lb

## 2014-08-12 DIAGNOSIS — J189 Pneumonia, unspecified organism: Secondary | ICD-10-CM

## 2014-08-12 DIAGNOSIS — J869 Pyothorax without fistula: Secondary | ICD-10-CM

## 2014-08-12 DIAGNOSIS — J181 Lobar pneumonia, unspecified organism: Principal | ICD-10-CM

## 2014-08-12 NOTE — Progress Notes (Signed)
[address]       Shane Vasquez             [phone]          HPI: Patient returns for routine postoperative follow-up having undergone left VATS/drainage of empyema by Dr. Servando Snare on 07/28/2014.  Intraoperative cultures yielded no growth. He was treated with Vancomycin and Zosyn while in the hospital, and switched to Augmentin at discharge. Postoperative course was otherwise uneventful and he was discharged home on 6/16 in improved condition.  Since hospital discharge, the patient has continued to do well.  He has completed his course of Augmentin. He denies cough or shortness of breath, but does get tired and winded with exertion. Appetite is good and pain is controlled with OTC pain medication.  He was seen on 6/23 by his primary MD and had a CBC which showed white count trending down at 14K, stable hemoglobin and hematocrit.  His only complaint is looser than normal stools, which are improving now that he is off antibiotics.  He is only having 1 BM per day and denies abdominal pain, watery or profuse diarrhea.    Current Outpatient Prescriptions  Medication Sig Dispense Refill  . acetaminophen (TYLENOL) 500 MG tablet Take 1,000 mg by mouth every 6 (six) hours as needed for mild pain, moderate pain, fever or headache.    . citalopram (CELEXA) 40 MG tablet Take 1 tablet (40 mg total) by mouth daily. (Patient taking differently: Take 20 mg by mouth daily. ) 90 tablet 3  . EPINEPHrine 0.3 mg/0.3 mL IJ SOAJ injection Inject 0.3 mLs (0.3 mg total) into the muscle once. 1 Device 2  . ferrous sulfate 325 (65 FE) MG tablet Take 1 tablet (325 mg total) by mouth 3 (three) times daily with meals. 90 tablet 0  . hydrochlorothiazide (HYDRODIURIL) 25 MG tablet Take 1 tablet (25 mg total) by mouth daily. 90 tablet 3  . ibuprofen (ADVIL,MOTRIN) 200 MG tablet Take 200 mg by mouth every 4 (four) hours as needed for fever, headache, moderate pain or cramping.     No  current facility-administered medications for this visit.     Physical Exam: BP 116/68 HR 77 Resp 16 Wounds: Clean and dry Heart: regular rate and rhythm Lungs: Clear to auscultation    Diagnostic Tests: Chest xray: Dg Chest 2 View  08/12/2014   CLINICAL DATA:  Recent pneumonia/empyema, status post videoscopic thoracic surgery procedure  EXAM: CHEST  2 VIEW  COMPARISON:  August 01, 2014  FINDINGS: The previously noted tiny left apical pneumothorax is no longer appreciable. There has been slight clearing of ill-defined opacity from the left base. A somewhat triangular-shaped area of increased opacity in the left base remains. There is a small loculated left effusion.  Right lung is clear. Heart size and pulmonary vascularity are normal. No adenopathy.  IMPRESSION: No demonstrable pneumothorax. Partial but incomplete clearing of opacity from the left base. A triangular-shaped opacity in the left base potentially could represent infarction in this area. There is no new opacity. There is a minimal left pleural effusion. Right lung clear. No change in cardiac silhouette.   Electronically Signed   By: Lowella Grip III M.D.   On: 08/12/2014 14:42       Assessment/Plan: Shane Vasquez is overall progressing well from left VATS/drainage of empyema.  All cultures and pathology were negative. He has completed a course of antibiotics and white blood count is trending down  appropriately.  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  I have cautioned him that he may want to start with a part time schedule and increase as tolerated. His loose stools are likely related to the antibiotics.  I am less concerned about the possibility of C diff given his improving white count and fewer and more normal stools, but I have asked him to monitor this and contact his primary MD if diarrhea persists or symptoms worsen.  We will plan to see him back in 3-4  weeks for a recheck.

## 2014-08-18 LAB — FUNGUS CULTURE W SMEAR: Fungal Smear: NONE SEEN

## 2014-08-22 ENCOUNTER — Ambulatory Visit: Payer: Medicare Other | Admitting: Cardiothoracic Surgery

## 2014-08-23 LAB — FUNGUS CULTURE W SMEAR
Fungal Smear: NONE SEEN
Fungal Smear: NONE SEEN

## 2014-09-08 LAB — AFB CULTURE WITH SMEAR (NOT AT ARMC)
Acid Fast Smear: NONE SEEN
Acid Fast Smear: NONE SEEN
Acid Fast Smear: NONE SEEN

## 2014-09-19 ENCOUNTER — Encounter: Payer: Self-pay | Admitting: Cardiothoracic Surgery

## 2014-09-19 ENCOUNTER — Other Ambulatory Visit: Payer: Self-pay

## 2014-09-19 ENCOUNTER — Ambulatory Visit
Admission: RE | Admit: 2014-09-19 | Discharge: 2014-09-19 | Disposition: A | Discharge status: Home / Self Care | Payer: Medicare Other | Source: Ambulatory Visit | Attending: Cardiothoracic Surgery | Admitting: Cardiothoracic Surgery

## 2014-09-19 ENCOUNTER — Ambulatory Visit (INDEPENDENT_AMBULATORY_CARE_PROVIDER_SITE_OTHER): Payer: Self-pay | Admitting: Cardiothoracic Surgery

## 2014-09-19 VITALS — BP 150/87 | HR 64 | Resp 20 | Ht 70.0 in | Wt 162.0 lb

## 2014-09-19 DIAGNOSIS — J869 Pyothorax without fistula: Secondary | ICD-10-CM

## 2014-09-19 NOTE — Progress Notes (Signed)
[address]       [place] [address]             [phone]      Roque E Gwinn [place] Medical Record #[id number] Date of Birth: [birth year]  Referring: Wilhelmina Mcardle, MD Primary Care: [name], MD  Chief Complaint:   POST OP FOLLOW UP 07/27/2014 OPERATIVE REPORT PREOPERATIVE DIAGNOSIS: Left lower lobe pneumonia with empyema. POSTOPERATIVE DIAGNOSIS: Left lower lobe pneumonia with empyema. SURGICAL PROCEDURE: Bronchoscopy with bronchial washings and culture. Left video-assisted thoracoscopy with drainage of empyema and decortication. SURGEON: Lanelle Bal, MD.  History of Present Illness:     Patient returns for routine postoperative follow-up having undergone left VATS/drainage of empyema  on 07/28/2014. Intraoperative cultures yielded no growth. He was treated with Vancomycin and Zosyn while in the hospital, and switched to Augmentin at discharge. Postoperative course was otherwise uneventful and he was discharged home on 6/16 in improved condition.  Since hospital discharge, the patient has continued to do well     Past Medical History  Diagnosis Date  . CERVICAL RADICULOPATHY, RIGHT 08/12/2009  . DIVERTICULOSIS, COLON 08/15/2006  . GERD 08/15/2006  . HYPERLIPIDEMIA 08/15/2006  . HYPERTENSION 08/15/2006  . INTERNAL HEMORRHOIDS 02/29/2008  . SKIN CANCER, HX OF 08/15/2006     History  Smoking status  . Former Smoker -- 1.00 packs/day  . Types: Cigarettes  . Quit date: 08/29/2012  Smokeless tobacco  . Never Used    History  Alcohol Use  . Yes     Allergies  Allergen Reactions  . Bee Venom Anaphylaxis and Nausea And Vomiting    Current Outpatient Prescriptions  Medication Sig Dispense Refill  . citalopram (CELEXA) 40 MG tablet Take 1 tablet (40 mg total) by mouth daily. (Patient taking differently: Take 20 mg by mouth daily. ) 90 tablet 3  . EPINEPHrine 0.3 mg/0.3 mL IJ SOAJ injection Inject 0.3 mLs (0.3  mg total) into the muscle once. 1 Device 2  . ferrous sulfate 325 (65 FE) MG tablet Take 1 tablet (325 mg total) by mouth 3 (three) times daily with meals. 90 tablet 0  . hydrochlorothiazide (HYDRODIURIL) 25 MG tablet Take 1 tablet (25 mg total) by mouth daily. 90 tablet 3  . acetaminophen (TYLENOL) 500 MG tablet Take 1,000 mg by mouth every 6 (six) hours as needed for mild pain, moderate pain, fever or headache.    . ibuprofen (ADVIL,MOTRIN) 200 MG tablet Take 200 mg by mouth every 4 (four) hours as needed for fever, headache, moderate pain or cramping.     No current facility-administered medications for this visit.       Physical Exam: BP 150/87 mmHg  Pulse 64  Resp 20  Ht '5\' 10"'$  (1.778 m)  Wt 162 lb (73.483 kg)  BMI 23.24 kg/m2  SpO2 97%  General appearance: alert and cooperative Neurologic: intact Heart: regular rate and rhythm, S1, S2 normal, no murmur, click, rub or gallop Lungs: clear to auscultation bilaterally Abdomen: soft, non-tender; bowel sounds normal; no masses,  no organomegaly Extremities: extremities normal, atraumatic, no cyanosis or edema and Homans sign is negative, no sign of DVT Wound: wounds well healed   Diagnostic Studies & Laboratory data:   PATH: Pleura, peel - FIBRINOUS MATERIAL. - THERE IS NO EVIDENCE OF MALIGNANCY. Enid Cutter MD   Recent Radiology Findings:   Dg Chest 2 View  09/19/2014   CLINICAL DATA:  Empyema, history of left VA TS in June  of 2016  EXAM: CHEST  2 VIEW  COMPARISON:  Chest x-ray of 08/12/2014  FINDINGS: The previously described opacity at the left lung base has improved although linear atelectasis or scarring remains. At the right lung is clear. Mediastinal and hilar contours are unremarkable. Heart size is stable. No bony abnormality is seen.  IMPRESSION: Interval improvement in opacity at the left lung base most consistent with atelectasis or scarring.   Electronically Signed   By: Ivar Drape M.D.   On: 09/19/2014 11:24        Recent Lab Findings: Lab Results  Component Value Date   WBC 14.9* 08/08/2014   HGB 9.7* 08/08/2014   HCT 29.5* 08/08/2014   PLT 873.0* 08/08/2014   GLUCOSE 93 07/31/2014   CHOL 335* 02/13/2014   TRIG 140.0 02/13/2014   HDL 68.30 02/13/2014   LDLDIRECT 205.7 11/23/2011   LDLCALC 239* 02/13/2014   ALT 35 07/29/2014   AST 34 07/29/2014   NA 136 07/31/2014   K 4.2 07/31/2014   CL 100* 07/31/2014   CREATININE 1.39* 07/31/2014   BUN 13 07/31/2014   CO2 25 07/31/2014   TSH 1.36 02/13/2014   INR 1.19 07/26/2014      Assessment / Plan:      Mr. Beagle il progressing well from left VATS/drainage of empyema. All cultures and pathology were negative. He has completed a course of antibiotics. Chest xray improving, will follow up in 3 months with chest xray to ensure complete clearing  [name] MD      [address] [place] [address] Office [phone]   Beeper [phone]  09/19/2014 11:29 AM

## 2014-12-20 ENCOUNTER — Encounter: Payer: Self-pay | Admitting: Internal Medicine

## 2014-12-20 ENCOUNTER — Ambulatory Visit (INDEPENDENT_AMBULATORY_CARE_PROVIDER_SITE_OTHER): Payer: Medicare Other | Admitting: Internal Medicine

## 2014-12-20 VITALS — BP 142/80 | HR 72 | Temp 98.6°F | Resp 20 | Ht 70.0 in | Wt 170.0 lb

## 2014-12-20 DIAGNOSIS — J869 Pyothorax without fistula: Secondary | ICD-10-CM | POA: Diagnosis not present

## 2014-12-20 DIAGNOSIS — E785 Hyperlipidemia, unspecified: Secondary | ICD-10-CM | POA: Diagnosis not present

## 2014-12-20 DIAGNOSIS — I10 Essential (primary) hypertension: Secondary | ICD-10-CM | POA: Diagnosis not present

## 2014-12-20 NOTE — Patient Instructions (Signed)
Nice and well-developed.  Areows.    It is important that you exercise regularly, at least 20 minutes 3 to 4 times per week.  If you develop chest pain or shortness of breath seek  medical attention.  Please check your blood pressure on a regular basis.  If it is consistently greater than 150/90, please make an office appointment.  l

## 2014-12-20 NOTE — Progress Notes (Signed)
Pre visit review using our clinic review tool, if applicable. No additional management support is needed unless otherwise documented below in the visit note. 

## 2014-12-20 NOTE — Progress Notes (Signed)
Subjective:    Patient ID: Shane Vasquez, male    DOB: 02/24/1950, 64 y.o.   MRN: [id number]  HPI  64 year old patient who is seen today in follow-up.  He has essential hypertension.  He has resumed hydrochlorothiazide after temporary hold while ill with pneumonia, complicated by empyema.  He is doing quite well.  He is scheduled for thoracic surgery follow-up. He has GERD which has been stable.  No new concerns or complaints. Blood pressure average in the 1:30 over 80 range  Preventive health.  Flu vaccine in the administered last month  Past Medical History  Diagnosis Date  . CERVICAL RADICULOPATHY, RIGHT 08/12/2009  . DIVERTICULOSIS, COLON 08/15/2006  . GERD 08/15/2006  . HYPERLIPIDEMIA 08/15/2006  . HYPERTENSION 08/15/2006  . INTERNAL HEMORRHOIDS 02/29/2008  . SKIN CANCER, HX OF 08/15/2006    Social History   Social History  . Marital Status: Married    Spouse Name: N/A  . Number of Children: N/A  . Years of Education: N/A   Occupational History  . Not on file.   Social History Main Topics  . Smoking status: Former Smoker -- 1.00 packs/day    Types: Cigarettes    Quit date: 08/29/2012  . Smokeless tobacco: Never Used  . Alcohol Use: Yes  . Drug Use: No  . Sexual Activity: Not on file   Other Topics Concern  . Not on file   Social History Narrative    Past Surgical History  Procedure Laterality Date  . Tonsillectomy    . Video bronchoscopy N/A 07/27/2014    Procedure: VIDEO BRONCHOSCOPY;  Surgeon: [name], MD;  Location: [facility] OR;  Service: Thoracic;  Laterality: N/A;  . Video assisted thoracoscopy (vats)/empyema Left 07/27/2014    Procedure: VIDEO ASSISTED THORACOSCOPY (VATS)/EMPYEMA Left, drainage pleural effusion.;  Surgeon: [name], MD;  Location: [facility] OR;  Service: Thoracic;  Laterality: Left;    Family History  Problem Relation Age of Onset  . Osteoporosis Mother   . COPD Father     Allergies  Allergen Reactions  . Bee Venom  Anaphylaxis and Nausea And Vomiting    Current Outpatient Prescriptions on File Prior to Visit  Medication Sig Dispense Refill  . acetaminophen (TYLENOL) 500 MG tablet Take 1,000 mg by mouth every 6 (six) hours as needed for mild pain, moderate pain, fever or headache.    . citalopram (CELEXA) 40 MG tablet Take 1 tablet (40 mg total) by mouth daily. (Patient taking differently: Take 20 mg by mouth daily. ) 90 tablet 3  . EPINEPHrine 0.3 mg/0.3 mL IJ SOAJ injection Inject 0.3 mLs (0.3 mg total) into the muscle once. 1 Device 2  . ferrous sulfate 325 (65 FE) MG tablet Take 1 tablet (325 mg total) by mouth 3 (three) times daily with meals. 90 tablet 0  . hydrochlorothiazide (HYDRODIURIL) 25 MG tablet Take 1 tablet (25 mg total) by mouth daily. 90 tablet 3  . ibuprofen (ADVIL,MOTRIN) 200 MG tablet Take 200 mg by mouth every 4 (four) hours as needed for fever, headache, moderate pain or cramping.     No current facility-administered medications on file prior to visit.    BP 142/80 mmHg  Pulse 72  Temp(Src) 98.6 F (37 C) (Oral)  Resp 20  Ht '5\' 10"'$  (1.778 m)  Wt 170 lb (77.111 kg)  BMI 24.39 kg/m2  SpO2 98%     Review of Systems  Constitutional: Negative for fever, chills, appetite change and fatigue.  HENT: Negative  for congestion, dental problem, ear pain, hearing loss, sore throat, tinnitus, trouble swallowing and voice change.   Eyes: Negative for pain, discharge and visual disturbance.  Respiratory: Negative for cough, chest tightness, wheezing and stridor.   Cardiovascular: Negative for chest pain, palpitations and leg swelling.  Gastrointestinal: Negative for nausea, vomiting, abdominal pain, diarrhea, constipation, blood in stool and abdominal distention.  Genitourinary: Negative for urgency, hematuria, flank pain, discharge, difficulty urinating and genital sores.  Musculoskeletal: Negative for myalgias, back pain, joint swelling, arthralgias, gait problem and neck stiffness.    Skin: Negative for rash.  Neurological: Negative for dizziness, syncope, speech difficulty, weakness, numbness and headaches.  Hematological: Negative for adenopathy. Does not bruise/bleed easily.  Psychiatric/Behavioral: Negative for behavioral problems and dysphoric mood. The patient is not nervous/anxious.        Objective:   Physical Exam  Constitutional: He is oriented to person, place, and time. He appears well-developed.  HENT:  Head: Normocephalic.  Right Ear: External ear normal.  Left Ear: External ear normal.  Eyes: Conjunctivae and EOM are normal.  Neck: Normal range of motion.  Cardiovascular: Normal rate and normal heart sounds.   Pulmonary/Chest: Breath sounds normal.  Nicely healed thoracotomy scars left lateral chest wall area  Abdominal: Bowel sounds are normal.  Musculoskeletal: Normal range of motion. He exhibits no edema or tenderness.  Neurological: He is alert and oriented to person, place, and time.  Psychiatric: He has a normal mood and affect. His behavior is normal.          Assessment & Plan:   Status post left lower lobe pneumonia, kidney by empyema Essential hypertension.  Continue present regimen   CPX July 2017

## 2014-12-26 ENCOUNTER — Ambulatory Visit: Payer: Medicare Other | Admitting: Cardiothoracic Surgery

## 2015-01-01 ENCOUNTER — Other Ambulatory Visit: Payer: Self-pay | Admitting: Cardiothoracic Surgery

## 2015-01-01 DIAGNOSIS — J181 Lobar pneumonia, unspecified organism: Principal | ICD-10-CM

## 2015-01-01 DIAGNOSIS — J189 Pneumonia, unspecified organism: Secondary | ICD-10-CM

## 2015-01-02 ENCOUNTER — Ambulatory Visit (INDEPENDENT_AMBULATORY_CARE_PROVIDER_SITE_OTHER): Payer: Medicare Other | Admitting: Cardiothoracic Surgery

## 2015-01-02 ENCOUNTER — Encounter: Payer: Self-pay | Admitting: Cardiothoracic Surgery

## 2015-01-02 ENCOUNTER — Ambulatory Visit
Admission: RE | Admit: 2015-01-02 | Discharge: 2015-01-02 | Disposition: A | Discharge status: Home / Self Care | Payer: Medicare Other | Source: Ambulatory Visit | Attending: Cardiothoracic Surgery | Admitting: Cardiothoracic Surgery

## 2015-01-02 VITALS — BP 140/90 | HR 80 | Resp 20 | Ht 70.0 in | Wt 170.0 lb

## 2015-01-02 DIAGNOSIS — J189 Pneumonia, unspecified organism: Secondary | ICD-10-CM

## 2015-01-02 DIAGNOSIS — J181 Lobar pneumonia, unspecified organism: Principal | ICD-10-CM

## 2015-01-02 DIAGNOSIS — J869 Pyothorax without fistula: Secondary | ICD-10-CM | POA: Diagnosis not present

## 2015-01-02 NOTE — Progress Notes (Signed)
Shane Vasquez       Shane Vasquez,Shane Vasquez             [phone]      [facility] Medical Record #[id number] Date of Birth: 09-20-1950  Referring: Wilhelmina Mcardle, MD Primary Care: [name], MD  Chief Complaint:   POST OP FOLLOW UP 07/27/2014 OPERATIVE REPORT PREOPERATIVE DIAGNOSIS: Left lower lobe pneumonia with empyema. POSTOPERATIVE DIAGNOSIS: Left lower lobe pneumonia with empyema. SURGICAL PROCEDURE: Bronchoscopy with bronchial washings and culture. Left video-assisted thoracoscopy with drainage of empyema and decortication. SURGEON: Lanelle Bal, MD.  History of Present Illness:     Patient returns for routine postoperative follow-up having undergone left VATS/drainage of empyema  on 07/28/2014. Intraoperative cultures yielded no growth. He was treated with Vancomycin and Zosyn while in the hospital, and switched to Augmentin at discharge. Postoperative course was otherwise uneventful and he was discharged home on 6/16 in improved condition.  Since hospital discharge, the patient has continued to do well. At this point his only complaint is some sinus drainage.  He is up-to-date on pneumococcal and flu vaccination     Past Medical History  Diagnosis Date  . CERVICAL RADICULOPATHY, RIGHT 08/12/2009  . DIVERTICULOSIS, COLON 08/15/2006  . GERD 08/15/2006  . HYPERLIPIDEMIA 08/15/2006  . HYPERTENSION 08/15/2006  . INTERNAL HEMORRHOIDS 02/29/2008  . SKIN CANCER, HX OF 08/15/2006     History  Smoking status  . Former Smoker -- 1.00 packs/day  . Types: Cigarettes  . Quit date: 08/29/2012  Smokeless tobacco  . Never Used    History  Alcohol Use  . Yes     Allergies  Allergen Reactions  . Bee Venom Anaphylaxis and Nausea And Vomiting    Current Outpatient Prescriptions  Medication Sig Dispense Refill  . acetaminophen (TYLENOL) 500 MG tablet Take 1,000 mg by mouth every 6 (six) hours as needed for mild  pain, moderate pain, fever or headache.    . citalopram (CELEXA) 40 MG tablet Take 1 tablet (40 mg total) by mouth daily. (Patient taking differently: Take 20 mg by mouth daily. ) 90 tablet 3  . EPINEPHrine 0.3 mg/0.3 mL IJ SOAJ injection Inject 0.3 mLs (0.3 mg total) into the muscle once. 1 Device 2  . ferrous sulfate 325 (65 FE) MG tablet Take 1 tablet (325 mg total) by mouth 3 (three) times daily with meals. 90 tablet 0  . hydrochlorothiazide (HYDRODIURIL) 25 MG tablet Take 1 tablet (25 mg total) by mouth daily. 90 tablet 3  . ibuprofen (ADVIL,MOTRIN) 200 MG tablet Take 200 mg by mouth every 4 (four) hours as needed for fever, headache, moderate pain or cramping.     No current facility-administered medications for this visit.       Physical Exam: BP 140/90 mmHg  Pulse 80  Resp 20  Ht '5\' 10"'$  (1.778 m)  Wt 170 lb (77.111 kg)  BMI 24.39 kg/m2  SpO2 96%  General appearance: alert and cooperative Neurologic: intact Heart: regular rate and rhythm, S1, S2 normal, no murmur, click, rub or gallop Lungs: clear to auscultation bilaterally Abdomen: soft, non-tender; bowel sounds normal; no masses,  no organomegaly Extremities: extremities normal, atraumatic, no cyanosis or edema and Homans sign is negative, no sign of DVT Wound: wounds well healed   Diagnostic Studies & Laboratory data:   PATH: Pleura, peel - FIBRINOUS MATERIAL. - THERE IS NO EVIDENCE OF MALIGNANCY. Enid Cutter MD   Recent Radiology Findings:  Dg Chest 2 View  01/02/2015  CLINICAL DATA:  History of left VA TS for empyema, some cough and chest congestion currently EXAM: CHEST  2 VIEW COMPARISON:  Chest x-ray of 09/19/2014 FINDINGS: Aeration of the left lung base has improved with only linear atelectasis remaining. There may be a tiny left pleural effusion remaining. The right lung is clear. Mediastinal and hilar contours are unchanged. The heart is within normal limits in size. No acute bony abnormality is seen.  IMPRESSION: Improved aeration of the left lung base with linear atelectasis remaining. Possible tiny left effusion as well. Electronically Signed   By: Ivar Drape M.D.   On: 01/02/2015 09:50      Recent Lab Findings: Lab Results  Component Value Date   WBC 14.9* 08/08/2014   HGB 9.7* 08/08/2014   HCT 29.5* 08/08/2014   PLT 873.0* 08/08/2014   GLUCOSE 93 07/31/2014   CHOL 335* 02/13/2014   TRIG 140.0 02/13/2014   HDL 68.30 02/13/2014   LDLDIRECT 205.7 11/23/2011   LDLCALC 239* 02/13/2014   ALT 35 07/29/2014   AST 34 07/29/2014   NA 136 07/31/2014   K 4.2 07/31/2014   CL 100* 07/31/2014   CREATININE 1.39* 07/31/2014   BUN 13 07/31/2014   CO2 25 07/31/2014   TSH 1.36 02/13/2014   INR 1.19 07/26/2014      Assessment / Plan:      [name] il progressing well from left VATS/drainage of empyema. All cultures and pathology were negative. He has completed a course of antibiotics. His follow-up chest x-ray shows continued improvement of the left lower lobe. I've not made a return appointment for him to see me but would be glad to see him at any time  [name] MD      [address] [place] [address] Office [phone]   Beeper [phone]  01/02/2015 10:08 AM

## 2015-05-09 ENCOUNTER — Telehealth: Payer: Self-pay | Admitting: Internal Medicine

## 2015-05-09 ENCOUNTER — Other Ambulatory Visit: Payer: Self-pay | Admitting: Internal Medicine

## 2015-05-09 NOTE — Telephone Encounter (Signed)
Spoke to pt, told him to Drink as much fluid as you can tolerate over the next few days, MiraLAX 2 scoops twice daily until constipation, improved.Then take when necessary, need to start high-fiber diet in regular exercise per Dr.K. Pt verbalized understanding. Told pt if symptoms not improved or develop nausea or vomiting to please call office. Pt verbalized understanding.

## 2015-05-09 NOTE — Telephone Encounter (Signed)
Patient Name: Shane Vasquez  DOB: 1950/06/04    Initial Comment Caller states he has been constipated for 2 wks. Also having back and side pain.   Nurse Assessment  Nurse: Raphael Gibney, RN, Vanita Ingles Date/Time (Eastern Time): 05/09/2015 9:27:29 AM  Confirm and document reason for call. If symptomatic, describe symptoms. You must click the next button to save text entered. ---Caller states he has been constipated for 2 weeks. Has had to take a laxative. Has pain in his right flank area and on his left side below his shoulder blade. Had BM this am. Not sleeping well because of the pain. Pain level 4-5.  Has the patient traveled out of the country within the last 30 days? ---No  Does the patient have any new or worsening symptoms? ---Yes  Will a triage be completed? ---Yes  Related visit to physician within the last 2 weeks? ---No  Does the PT have any chronic conditions? (i.e. diabetes, asthma, etc.) ---No  Is this a behavioral health or substance abuse call? ---No     Guidelines    Guideline Title Affirmed Question Affirmed Notes  Flank Pain MODERATE pain (e.g., interferes with normal activities or awakens from sleep)   Constipation Unable to have a bowel movement (BM) without laxative or enema   Constipation Unable to have a bowel movement (BM) without laxative or enema    Final Disposition User   See Physician within 24 Hours [place], RN, Vera    Comments  Dr. Melina Fiddler does not have any appts available for today. Pt does not want to see another doctor but wants to see Dr. Melina Fiddler. Please call pt back.   Referrals  GO TO FACILITY REFUSED   Disagree/Comply: Comply    Disagree/Comply: Disagree  Disagree/Comply Reason: Disagree with instructions  Disagree/Comply: Comply

## 2015-05-09 NOTE — Telephone Encounter (Signed)
Please see message and advise 

## 2015-05-09 NOTE — Telephone Encounter (Signed)
Drink as much fluid as you  can tolerate over the next few days  MiraLAX 2 scoops twice daily until constipation, improved.  Then take when necessary  Encourage high-fiber diet in regular exercise

## 2015-05-18 ENCOUNTER — Inpatient Hospital Stay (HOSPITAL_COMMUNITY)
Admission: EM | Admit: 2015-05-18 | Discharge: 2015-05-20 | DRG: 180 | Disposition: A | Discharge status: Home / Self Care | Payer: Medicare Other | Attending: Internal Medicine | Admitting: Internal Medicine

## 2015-05-18 ENCOUNTER — Emergency Department (HOSPITAL_COMMUNITY): Payer: Medicare Other

## 2015-05-18 ENCOUNTER — Encounter (HOSPITAL_COMMUNITY): Payer: Self-pay | Admitting: *Deleted

## 2015-05-18 DIAGNOSIS — R590 Localized enlarged lymph nodes: Secondary | ICD-10-CM | POA: Diagnosis present

## 2015-05-18 DIAGNOSIS — J449 Chronic obstructive pulmonary disease, unspecified: Secondary | ICD-10-CM | POA: Diagnosis present

## 2015-05-18 DIAGNOSIS — R7989 Other specified abnormal findings of blood chemistry: Secondary | ICD-10-CM | POA: Diagnosis not present

## 2015-05-18 DIAGNOSIS — D696 Thrombocytopenia, unspecified: Secondary | ICD-10-CM

## 2015-05-18 DIAGNOSIS — K5900 Constipation, unspecified: Secondary | ICD-10-CM | POA: Diagnosis present

## 2015-05-18 DIAGNOSIS — D386 Neoplasm of uncertain behavior of respiratory organ, unspecified: Secondary | ICD-10-CM

## 2015-05-18 DIAGNOSIS — J9601 Acute respiratory failure with hypoxia: Secondary | ICD-10-CM | POA: Diagnosis not present

## 2015-05-18 DIAGNOSIS — Z87891 Personal history of nicotine dependence: Secondary | ICD-10-CM

## 2015-05-18 DIAGNOSIS — J90 Pleural effusion, not elsewhere classified: Secondary | ICD-10-CM | POA: Diagnosis present

## 2015-05-18 DIAGNOSIS — I10 Essential (primary) hypertension: Secondary | ICD-10-CM | POA: Diagnosis present

## 2015-05-18 DIAGNOSIS — E785 Hyperlipidemia, unspecified: Secondary | ICD-10-CM | POA: Diagnosis present

## 2015-05-18 DIAGNOSIS — D491 Neoplasm of unspecified behavior of respiratory system: Secondary | ICD-10-CM | POA: Diagnosis present

## 2015-05-18 DIAGNOSIS — R0902 Hypoxemia: Secondary | ICD-10-CM

## 2015-05-18 DIAGNOSIS — R748 Abnormal levels of other serum enzymes: Secondary | ICD-10-CM

## 2015-05-18 DIAGNOSIS — R0602 Shortness of breath: Secondary | ICD-10-CM | POA: Diagnosis present

## 2015-05-18 DIAGNOSIS — K7689 Other specified diseases of liver: Secondary | ICD-10-CM | POA: Diagnosis not present

## 2015-05-18 DIAGNOSIS — C3490 Malignant neoplasm of unspecified part of unspecified bronchus or lung: Principal | ICD-10-CM | POA: Insufficient documentation

## 2015-05-18 DIAGNOSIS — Z85828 Personal history of other malignant neoplasm of skin: Secondary | ICD-10-CM

## 2015-05-18 DIAGNOSIS — Z79899 Other long term (current) drug therapy: Secondary | ICD-10-CM

## 2015-05-18 DIAGNOSIS — N179 Acute kidney failure, unspecified: Secondary | ICD-10-CM | POA: Diagnosis present

## 2015-05-18 DIAGNOSIS — R109 Unspecified abdominal pain: Secondary | ICD-10-CM | POA: Diagnosis not present

## 2015-05-18 DIAGNOSIS — K219 Gastro-esophageal reflux disease without esophagitis: Secondary | ICD-10-CM | POA: Diagnosis present

## 2015-05-18 DIAGNOSIS — R599 Enlarged lymph nodes, unspecified: Secondary | ICD-10-CM | POA: Diagnosis present

## 2015-05-18 DIAGNOSIS — J9600 Acute respiratory failure, unspecified whether with hypoxia or hypercapnia: Secondary | ICD-10-CM | POA: Diagnosis present

## 2015-05-18 DIAGNOSIS — R945 Abnormal results of liver function studies: Secondary | ICD-10-CM

## 2015-05-18 DIAGNOSIS — K769 Liver disease, unspecified: Secondary | ICD-10-CM | POA: Diagnosis present

## 2015-05-18 DIAGNOSIS — R918 Other nonspecific abnormal finding of lung field: Secondary | ICD-10-CM | POA: Insufficient documentation

## 2015-05-18 DIAGNOSIS — D72829 Elevated white blood cell count, unspecified: Secondary | ICD-10-CM | POA: Diagnosis present

## 2015-05-18 HISTORY — DX: Pyothorax without fistula: J86.9

## 2015-05-18 HISTORY — DX: Pneumonia, unspecified organism: J18.9

## 2015-05-18 LAB — COMPREHENSIVE METABOLIC PANEL
ALBUMIN: 2.9 g/dL — AB (ref 3.5–5.0)
ALT: 276 U/L — AB (ref 17–63)
AST: 336 U/L — AB (ref 15–41)
Alkaline Phosphatase: 482 U/L — ABNORMAL HIGH (ref 38–126)
Anion gap: 16 — ABNORMAL HIGH (ref 5–15)
BILIRUBIN TOTAL: 0.9 mg/dL (ref 0.3–1.2)
BUN: 21 mg/dL — AB (ref 6–20)
CO2: 17 mmol/L — ABNORMAL LOW (ref 22–32)
CREATININE: 1.68 mg/dL — AB (ref 0.61–1.24)
Calcium: 9.5 mg/dL (ref 8.9–10.3)
Chloride: 101 mmol/L (ref 101–111)
GFR calc Af Amer: 47 mL/min — ABNORMAL LOW (ref >60)
GFR, EST NON AFRICAN AMERICAN: 41 mL/min — AB (ref >60)
GLUCOSE: 104 mg/dL — AB (ref 65–99)
POTASSIUM: 3.8 mmol/L (ref 3.5–5.1)
Sodium: 134 mmol/L — ABNORMAL LOW (ref 135–145)
Total Protein: 6.8 g/dL (ref 6.5–8.1)

## 2015-05-18 LAB — URINE MICROSCOPIC-ADD ON

## 2015-05-18 LAB — CBC
HEMATOCRIT: 41.9 % (ref 39.0–52.0)
Hemoglobin: 14.4 g/dL (ref 13.0–17.0)
MCH: 32.1 pg (ref 26.0–34.0)
MCHC: 34.4 g/dL (ref 30.0–36.0)
MCV: 93.5 fL (ref 78.0–100.0)
PLATELETS: 81 10*3/uL — AB (ref 150–400)
RBC: 4.48 MIL/uL (ref 4.22–5.81)
RDW: 13.9 % (ref 11.5–15.5)
WBC: 17 10*3/uL — ABNORMAL HIGH (ref 4.0–10.5)

## 2015-05-18 LAB — I-STAT CG4 LACTIC ACID, ED
LACTIC ACID, VENOUS: 2.81 mmol/L — AB (ref 0.5–2.0)
Lactic Acid, Venous: 1.66 mmol/L (ref 0.5–2.0)

## 2015-05-18 LAB — URINALYSIS, ROUTINE W REFLEX MICROSCOPIC
Glucose, UA: NEGATIVE mg/dL
Ketones, ur: 15 mg/dL — AB
NITRITE: NEGATIVE
PH: 5 (ref 5.0–8.0)
PROTEIN: 30 mg/dL — AB
Specific Gravity, Urine: 1.017 (ref 1.005–1.030)

## 2015-05-18 LAB — POC OCCULT BLOOD, ED: Fecal Occult Bld: POSITIVE — AB

## 2015-05-18 LAB — LIPASE, BLOOD: Lipase: 30 U/L (ref 11–51)

## 2015-05-18 MED ORDER — HEPARIN SODIUM (PORCINE) 5000 UNIT/ML IJ SOLN: 5000.0000 [IU] | Freq: Three times a day (TID) | INTRAMUSCULAR | Status: DC

## 2015-05-18 MED ORDER — ACETAMINOPHEN 650 MG RE SUPP: 650.0000 mg | Freq: Four times a day (QID) | RECTAL | Status: DC | PRN

## 2015-05-18 MED ORDER — ONDANSETRON HCL 4 MG PO TABS: 4.0000 mg | ORAL_TABLET | Freq: Four times a day (QID) | ORAL | Status: DC | PRN

## 2015-05-18 MED ORDER — SODIUM CHLORIDE 0.9 % IV SOLN
INTRAVENOUS | Status: DC
  Administered 2015-05-18 – 2015-05-20 (×4): via INTRAVENOUS

## 2015-05-18 MED ORDER — CITALOPRAM HYDROBROMIDE 20 MG PO TABS
20.0000 mg | ORAL_TABLET | Freq: Every day | ORAL | Status: DC
  Administered 2015-05-18 – 2015-05-19 (×2): 20 mg via ORAL
  Filled 2015-05-18 (×2): qty 1

## 2015-05-18 MED ORDER — SODIUM CHLORIDE 0.9 % IV BOLUS (SEPSIS)
1000.0000 mL | Freq: Once | INTRAVENOUS | Status: AC
  Administered 2015-05-18: 1000 mL via INTRAVENOUS

## 2015-05-18 MED ORDER — MORPHINE SULFATE (PF) 4 MG/ML IV SOLN
4.0000 mg | Freq: Once | INTRAVENOUS | Status: AC
  Administered 2015-05-18: 4 mg via INTRAVENOUS
  Filled 2015-05-18: qty 1

## 2015-05-18 MED ORDER — ONDANSETRON HCL 4 MG/2ML IJ SOLN: 4.0000 mg | Freq: Four times a day (QID) | INTRAMUSCULAR | Status: DC | PRN

## 2015-05-18 MED ORDER — ONDANSETRON HCL 4 MG/2ML IJ SOLN
4.0000 mg | Freq: Once | INTRAMUSCULAR | Status: AC
  Administered 2015-05-18: 4 mg via INTRAVENOUS
  Filled 2015-05-18: qty 2

## 2015-05-18 MED ORDER — CITALOPRAM HYDROBROMIDE 20 MG PO TABS: 40.0000 mg | ORAL_TABLET | Freq: Every day | ORAL | Status: DC

## 2015-05-18 MED ORDER — SENNA 8.6 MG PO TABS: 1.0000 | ORAL_TABLET | Freq: Every day | ORAL | Status: DC | PRN

## 2015-05-18 MED ORDER — ACETAMINOPHEN 325 MG PO TABS: 650.0000 mg | ORAL_TABLET | Freq: Four times a day (QID) | ORAL | Status: DC | PRN

## 2015-05-18 MED ORDER — BISACODYL 10 MG RE SUPP: 10.0000 mg | Freq: Every day | RECTAL | Status: DC | PRN

## 2015-05-18 MED ORDER — HYDROMORPHONE HCL 1 MG/ML IJ SOLN
0.5000 mg | INTRAMUSCULAR | Status: DC | PRN
  Administered 2015-05-19: 0.5 mg via INTRAVENOUS
  Filled 2015-05-18: qty 1

## 2015-05-18 MED ORDER — HYDROCHLOROTHIAZIDE 25 MG PO TABS: 25.0000 mg | ORAL_TABLET | Freq: Every day | ORAL | Status: DC

## 2015-05-18 MED ORDER — POLYETHYLENE GLYCOL 3350 17 G PO PACK: 17.0000 g | PACK | Freq: Every day | ORAL | Status: DC | PRN

## 2015-05-18 MED ORDER — HYDROCODONE-ACETAMINOPHEN 5-325 MG PO TABS
1.0000 | ORAL_TABLET | ORAL | Status: DC | PRN
  Administered 2015-05-18 – 2015-05-20 (×2): 2 via ORAL
  Filled 2015-05-18 (×2): qty 2

## 2015-05-18 MED ORDER — IBUPROFEN 400 MG PO TABS: 400.0000 mg | ORAL_TABLET | ORAL | Status: DC | PRN

## 2015-05-18 MED ORDER — HEPARIN SODIUM (PORCINE) 5000 UNIT/ML IJ SOLN: 5000.0000 [IU] | Freq: Three times a day (TID) | INTRAMUSCULAR | Status: AC

## 2015-05-18 MED ORDER — HYDROMORPHONE HCL 1 MG/ML IJ SOLN
1.0000 mg | Freq: Once | INTRAMUSCULAR | Status: AC
  Administered 2015-05-18: 1 mg via INTRAVENOUS
  Filled 2015-05-18: qty 1

## 2015-05-18 NOTE — H&P (Signed)
[facility] History and Physical  Shane Vasquez PZW:[id number] DOB: Apr 02, 1950 DOA: 05/18/2015  Referring physician: patel PCP: [name], MD   Chief Complaint: abdominal pain  HPI: Shane Vasquez is a 65 y.o. WM PMHx lipidemia, hypertension, left lower lobe pneumonia with empyema in June 2016 since to emergency department with the chief complaint of abdominal pain and gradual worsening shortness of breath with exertion. Initial evaluation in the emergency department reveals acute respiratory failure with hypoxia.  Information is obtained from the patient reports a very gradual worsening dyspnea with exertion over the last 3-4 weeks. Associated symptoms include least nonproductive cough. He denies chest pain palpitation headache dizziness syncope or near-syncope. He denies any fever chills lower extremity edema or orthopnea. He reports he recovered fully from his pneumonia last year and did not require supplemental oxygen. In addition suffered with intermittent constipation and abdominal distention. He reports nausea without vomiting. He reports no unintentional weight loss. He reports his last bowel movement was yesterday it was small and watery. He denies any dark stools or bright red blood per rectum.  In the emergency department he is afebrile hypotensive and decision saturation level of 88% on room air. Provided with oxygen supplementation and IV fluids.  At the time of admission blood pressure 100/69 oxygen saturation level 93% on 2 L nasal cannula  Review of Systems:  10 point review of systems complete and all systems are negative except as indicated in the history of present illness  Past Medical History  Diagnosis Date  . CERVICAL RADICULOPATHY, RIGHT 08/12/2009  . DIVERTICULOSIS, COLON 08/15/2006  . GERD 08/15/2006  . HYPERLIPIDEMIA 08/15/2006  . HYPERTENSION 08/15/2006  . INTERNAL HEMORRHOIDS 02/29/2008  . SKIN CANCER, HX OF 08/15/2006  . CAP (community acquired  pneumonia)     LLL 07/2014  . Empyema lung ([place])     2016 on left   Past Surgical History  Procedure Laterality Date  . Tonsillectomy    . Video bronchoscopy N/A 07/27/2014    Procedure: VIDEO BRONCHOSCOPY;  Surgeon: [name], MD;  Location: [facility] OR;  Service: Thoracic;  Laterality: N/A;  . Video assisted thoracoscopy (vats)/empyema Left 07/27/2014    Procedure: VIDEO ASSISTED THORACOSCOPY (VATS)/EMPYEMA Left, drainage pleural effusion.;  Surgeon: [name], MD;  Location: [facility];  Service: Thoracic;  Laterality: Left;   Social History:  reports that he quit smoking about 2 years ago. His smoking use included Cigarettes. He smoked 1.00 pack per day. He has never used smokeless tobacco. He reports that he does not drink alcohol or use illicit drugs.  Allergies  Allergen Reactions  . Bee Venom Anaphylaxis and Nausea And Vomiting    Family History  Problem Relation Age of Onset  . Osteoporosis Mother   . COPD Father      Prior to Admission medications   Medication Sig Start Date End Date Taking? Authorizing Provider  acetaminophen (TYLENOL) 500 MG tablet Take 1,000 mg by mouth every 6 (six) hours as needed for mild pain, moderate pain, fever or headache.   Yes Historical Provider, MD  citalopram (CELEXA) 40 MG tablet TAKE 1 TABLET BY MOUTH DAILY 05/09/15  Yes Marletta Lor, MD  EPINEPHrine 0.3 mg/0.3 mL IJ SOAJ injection Inject 0.3 mLs (0.3 mg total) into the muscle once. 08/08/14  Yes Marletta Lor, MD  hydrochlorothiazide (HYDRODIURIL) 25 MG tablet TAKE 1 TABLET BY MOUTH DAILY 05/09/15  Yes Marletta Lor, MD  ibuprofen (ADVIL,MOTRIN) 200 MG tablet Take 400 mg  by mouth every 4 (four) hours as needed for fever, headache, moderate pain or cramping.    Yes Historical Provider, MD  polyethylene glycol (MIRALAX / GLYCOLAX) packet Take 17 g by mouth daily as needed for mild constipation.   Yes Historical Provider, MD  senna (SENOKOT) 8.6 MG TABS tablet Take 1  tablet by mouth daily as needed for mild constipation.   Yes Historical Provider, MD   Physical Exam: Filed Vitals:   05/18/15 1545 05/18/15 1600 05/18/15 1615 05/18/15 1710  BP: 114/74 109/65 100/69 108/70  Pulse: 87 85 81 84  Temp:    97.9 F (36.6 C)  TempSrc:    Oral  Resp: '19 20 19 19  '$ Height:    '5\' 9"'$  (1.753 m)  Weight:      SpO2: 91% 87% 93% 93%    Wt Readings from Last 3 Encounters:  05/18/15 71.895 kg (158 lb 8 oz)  01/02/15 77.111 kg (170 lb)  12/20/14 77.111 kg (170 lb)    General:  Appears calm and comfortable, slightly pale Eyes: PERRL, normal lids, irises & conjunctiva ENT: grossly normal hearing, lips & tongue mucous membranes of his mouth are slightly dry but pink Neck: no LAD, masses or thyromegaly Cardiovascular: RRR with sounds somewhat distant, no m/r/g. No LE edema. Respiratory: RML negative breath sounds, diminished breath sounds RUL/RLL, crackles RLL, Normal respiratory effort. Normal sounds all left lung fields. Abdomen: soft, ntnd sluggish bowel sounds no guarding or rebounding Skin: no rash or induration seen on limited exam Musculoskeletal: grossly normal tone BUE/BLE Psychiatric: grossly normal mood and affect, speech fluent and appropriate Neurologic: grossly non-focal. speech clear facial symmetry           Labs on Admission:  Basic Metabolic Panel:  Recent Labs Lab 05/18/15 0916  NA 134*  K 3.8  CL 101  CO2 17*  GLUCOSE 104*  BUN 21*  CREATININE 1.68*  CALCIUM 9.5   Liver Function Tests:  Recent Labs Lab 05/18/15 0916  AST 336*  ALT 276*  ALKPHOS 482*  BILITOT 0.9  PROT 6.8  ALBUMIN 2.9*    Recent Labs Lab 05/18/15 0916  LIPASE 30   No results for input(s): AMMONIA in the last 168 hours. CBC:  Recent Labs Lab 05/18/15 0916  WBC 17.0*  HGB 14.4  HCT 41.9  MCV 93.5  PLT 81*   Cardiac Enzymes: No results for input(s): CKTOTAL, CKMB, CKMBINDEX, TROPONINI in the last 168 hours.  BNP (last 3 results) No  results for input(s): BNP in the last 8760 hours.  ProBNP (last 3 results) No results for input(s): PROBNP in the last 8760 hours.  CBG: No results for input(s): GLUCAP in the last 168 hours.  Radiological Exams on Admission: Ct Abdomen Pelvis Wo Contrast  05/18/2015  CLINICAL DATA:  65 year old male with abdominal pelvic pain, nausea and right lower lung opacity identified on recent chest radiograph. EXAM: CT CHEST, ABDOMEN AND PELVIS WITHOUT CONTRAST TECHNIQUE: Multidetector CT imaging of the chest, abdomen and pelvis was performed following the standard protocol without IV contrast. COMPARISON:  05/18/2015 and prior radiographs.  07/26/2014 chest CT. FINDINGS: CT CHEST Mediastinum/Nodes: Multiple enlarged mediastinal and right hilar lymph nodes are identified. Index nodes include the following: A 3.8 x 3.8 cm right peritracheal node (image 25). A 3.4 x 4.6 cm sub carinal node (image 33). A 1.7 cm right hilar node (image 32). There is no evidence of pericardial effusion. Ectasia of the ascending aorta measuring 3.5 cm noted. Lungs/Pleura: An ill-defined  7 x 9 cm mass within the central right middle/ lower lobes identified extending to the right hilum. Nodular interstitial thickening within the right lower lobe identified. Ill-defined ground-glass and nodular opacities within the right upper lobe are identified. A small right pleural effusion is noted with pleural nodularity. 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 There is no evidence of left pleural effusion. Emphysema is identified. Musculoskeletal: No acute or suspicious abnormalities identified. CT ABDOMEN AND PELVIS Please note that parenchymal abnormalities may be missed without intravenous contrast. Hepatobiliary: Multiple faint hypodense lesions within the liver are noted. The largest lesion measures 1.5 cm and anterior liver (image 57). The gallbladder is unremarkable. There is no evidence  of biliary dilatation. Pancreas: Unremarkable Spleen: Unremarkable Adrenals/Urinary Tract: Mild bilateral renal atrophy noted. No definite mass, hydronephrosis urinary calculi noted. The bladder and adrenal glands are unremarkable. Stomach/Bowel: Colonic diverticulosis noted without diverticulitis. No evidence bowel obstruction or definite focal bowel wall thickening. Vascular/Lymphatic: Enlarged retroperitoneal and upper abdominal lymph nodes identified. An index 1.8 x 2.1 cm right retroperitoneal node (image 68) noted. Aortic atherosclerotic calcifications noted without aneurysm. Reproductive: Mild prostate enlargement noted. Other: No free fluid, pneumoperitoneum or focal collection. Musculoskeletal: No acute or suspicious abnormalities identified. IMPRESSION: 7 x 9 cm central right middle lobe/lower lobe mass compatible with malignancy. Right lower lobe nodular interstitial thickening,, right upper lobe nodules, right pleural nodules with small right pleural effusion, enlarged mediastinal, right hilar and upper abdominal lymph nodes, and ill-defined hepatic lesions- compatible with metastatic disease. Electronically Signed   By: Margarette Canada M.D.   On: 05/18/2015 14:51   Dg Chest 2 View  05/18/2015  CLINICAL DATA:  Shortness of breath and dry cough for 1-1/2 weeks. Former smoker for 50 years. EXAM: CHEST  2 VIEW COMPARISON:  Chest x-rays dated 01/02/2015 and 09/19/2014. FINDINGS: New ill-defined airspace opacity within the anterior aspect of the right lower lobe, extending to the right hilum. Chronic scarring/ atelectasis noted at the left lung base. Lungs are hyperexpanded indicating some degree of COPD. Coarse interstitial markings are seen within both lungs which is likely a related chronic interstitial lung disease. Heart size is upper normal. IMPRESSION: New dense ill-defined airspace opacity within the right lower lobe, extending to the right hilum. Additional vague density at the right hilum and within  the overlying medial suprahilar region. This could represent pneumonia or airspace collapse related to a central obstructing mass. Recommend chest CT with contrast for further characterization. Hyperexpanded lungs indicating COPD/emphysema. Suspect associated chronic interstitial lung disease bilaterally. Electronically Signed   By: Franki Cabot M.D.   On: 05/18/2015 12:37   Dg Abd 1 View  05/18/2015  CLINICAL DATA:  Lower abdominal pain, constipation. EXAM: ABDOMEN - 1 VIEW COMPARISON:  None. FINDINGS: The bowel gas pattern is normal. Phleboliths are noted in the pelvis. Inferior tip of liver extends iliac wing suggesting hepatomegaly. IMPRESSION: No evidence of bowel obstruction or ileus.  Possible hepatomegaly. Electronically Signed   By: Marijo Conception, M.D.   On: 05/18/2015 11:20   Ct Chest Wo Contrast  05/18/2015  CLINICAL DATA:  65 year old male with abdominal pelvic pain, nausea and right lower lung opacity identified on recent chest radiograph. EXAM: CT CHEST, ABDOMEN AND PELVIS WITHOUT CONTRAST TECHNIQUE: Multidetector CT imaging of the chest, abdomen and pelvis was performed following the standard protocol without IV contrast. COMPARISON:  05/18/2015 and prior radiographs.  07/26/2014 chest CT. FINDINGS: CT CHEST Mediastinum/Nodes: Multiple enlarged mediastinal and right  hilar lymph nodes are identified. Index nodes include the following: A 3.8 x 3.8 cm right peritracheal node (image 25). A 3.4 x 4.6 cm sub carinal node (image 33). A 1.7 cm right hilar node (image 32). There is no evidence of pericardial effusion. Ectasia of the ascending aorta measuring 3.5 cm noted. Lungs/Pleura: An ill-defined 7 x 9 cm mass within the central right middle/ lower lobes identified extending to the right hilum. Nodular interstitial thickening within the right lower lobe identified. Ill-defined ground-glass and nodular opacities within the right upper lobe are identified. A small right pleural effusion is noted with  pleural nodularity. 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 There is no evidence of left pleural effusion. Emphysema is identified. Musculoskeletal: No acute or suspicious abnormalities identified. CT ABDOMEN AND PELVIS Please note that parenchymal abnormalities may be missed without intravenous contrast. Hepatobiliary: Multiple faint hypodense lesions within the liver are noted. The largest lesion measures 1.5 cm and anterior liver (image 57). The gallbladder is unremarkable. There is no evidence of biliary dilatation. Pancreas: Unremarkable Spleen: Unremarkable Adrenals/Urinary Tract: Mild bilateral renal atrophy noted. No definite mass, hydronephrosis urinary calculi noted. The bladder and adrenal glands are unremarkable. Stomach/Bowel: Colonic diverticulosis noted without diverticulitis. No evidence bowel obstruction or definite focal bowel wall thickening. Vascular/Lymphatic: Enlarged retroperitoneal and upper abdominal lymph nodes identified. An index 1.8 x 2.1 cm right retroperitoneal node (image 68) noted. Aortic atherosclerotic calcifications noted without aneurysm. Reproductive: Mild prostate enlargement noted. Other: No free fluid, pneumoperitoneum or focal collection. Musculoskeletal: No acute or suspicious abnormalities identified. IMPRESSION: 7 x 9 cm central right middle lobe/lower lobe mass compatible with malignancy. Right lower lobe nodular interstitial thickening,, right upper lobe nodules, right pleural nodules with small right pleural effusion, enlarged mediastinal, right hilar and upper abdominal lymph nodes, and ill-defined hepatic lesions- compatible with metastatic disease. Electronically Signed   By: Margarette Canada M.D.   On: 05/18/2015 14:51    EKG: Independently reviewed. Sinus rhythm Low voltage, precordial leads Borderline prolonged QT interval  Assessment/Plan Principal Problem:   Acute respiratory failure with hypoxia  (HCC) Active Problems:   Essential hypertension   GERD   Shortness of breath   Pleural effusion   Leukocytosis   Acute respiratory failure (HCC)   Liver lesion   Elevated LFTs   Neoplasm of lung   Enlarged lymph nodes  #1. Acute respiratory failure with hypoxia/Lung neoplasm.  -May be multifactorial. No hx of smoking. CT reveals right middle lobe and lower lobe mass compatible with malignancy as well as right lower lobe thickening right upper lobe nodules and small right pleural effusion with enlarged mediastinal right hilar and upper abdominal lymph nodes ill-defined hepatic lesions. WBC 17 as had leukocytosis for last 9 months per chart review, lactic acid 2.81 on presentation trending down after IV fluids. Given CT results concern for PE -Admit to telemetry -Gentle IV fluids -Oxygen supplementation and wean as able -Will go ahead and start heparin as unable to get CT with contrast due to acute kidney injury. In addition patient would be unable to perform necessary breathing maneuvers for VQ scan, If His creatinine improved consider CT angio of chest tomorrow.  -Requested IR drain Right Pleural effusion, and perform core biopsy of RML mass.  - Discussed with patient all possible causes of hypoxia and plan for admission. Will need outpatient follow-up with bronchoscopy and referral to oncology -IR consult for lung biopsy  #2. Abdominal pain/enlarged abdominal lymph nodes/liver lesions.  Elevated LFTs total bili 0.9.  -Counseled patient concerning his enlarged lymph nodes throughout his abdomen which are most likely cause of his pain -May need GI consult in addition to oncology -Supportive therapy  #3. Hypertension. Initial blood pressure hypotensive. On time of admission blood pressure 100/69. Home medications include hydrochlorothiazide. Will hold this for now -Continue gentle IV fluids -Monitor blood pressure  #4. GERD. Appears stable at baseline. Continue home  medications    Code Status: full DVT Prophylaxis: Heparin, stop after 2400--> SCD until biopsies complete then restart heparin Family Communication: wife at bedside Disposition Plan: home when ready  Time spent: 75 minutes  [facility], [facility]   Care during the described time interval was provided by myself and NP Dyanne Carrel .  I have reviewed this patient's available data, including medical history, events of note, physical examination, and all test results as part of my evaluation. I have personally reviewed and interpreted all radiology studies.   Dia Crawford, MD [phone] Pager

## 2015-05-18 NOTE — ED Notes (Signed)
Pt to CT

## 2015-05-18 NOTE — ED Notes (Signed)
Pt returns from ct  

## 2015-05-18 NOTE — ED Notes (Signed)
Pt taking po fluids and tolerating well. 

## 2015-05-18 NOTE — ED Notes (Signed)
[place] Charge Rn informed of WBC & BP & change in acuity

## 2015-05-18 NOTE — ED Notes (Signed)
Pt placed on o2 at 2 l/[place]

## 2015-05-18 NOTE — ED Notes (Signed)
Pt in from home c/o constipation x 4 wks, pt reports having to take a laxative to help him have a BM, pt reports last BM yesterday, denies bloody or dark colored stools, pt reports nausea, denies vomiting, A&O x 4, pt reports bil abd pain

## 2015-05-18 NOTE — ED Provider Notes (Signed)
CSN: [id number]     Arrival date & time 05/18/15  0804 History   First MD Initiated Contact with Patient 05/18/15 1021     Chief Complaint  Patient presents with  . Abdominal Pain   (Consider location/radiation/quality/duration/timing/severity/associated sxs/prior Treatment) Patient is a 65 y.o. male presenting with abdominal pain. The history is provided by the patient and the spouse. No language interpreter was used.  Abdominal Pain Associated symptoms: constipation, nausea and shortness of breath   Associated symptoms: no fever and no vomiting     Shane Vasquez is a 65 y.o male with a history of lipidemia, hypertension who presents from home for intermittent constipation 4 weeks. Reports taking a MiraLAX to help him have a bowel movement and had one yesterday. He reports small bowel movements that have been watery. States he has been severely nauseated without vomiting. Wife reports that he has always had normal bowel movements and goes at 8 AM every day. Reports that he is a daily drinker and drinks 2-3/day and sometimes one to 2 glasses of wine daily. Also complaining of increased shortness of breath over the past 4 weeks. Sometimes with exertion. Denies any previous abdominal surgeries.  He denies any fever, chills, difficulty swallowing, recent illness, chest pain, shortness of breath, vomiting, hematochezia, or urinary symptoms.  Past Medical History  Diagnosis Date  . CERVICAL RADICULOPATHY, RIGHT 08/12/2009  . DIVERTICULOSIS, COLON 08/15/2006  . GERD 08/15/2006  . HYPERLIPIDEMIA 08/15/2006  . HYPERTENSION 08/15/2006  . INTERNAL HEMORRHOIDS 02/29/2008  . SKIN CANCER, HX OF 08/15/2006   Past Surgical History  Procedure Laterality Date  . Tonsillectomy    . Video bronchoscopy N/A 07/27/2014    Procedure: VIDEO BRONCHOSCOPY;  Surgeon: [name], MD;  Location: [facility] OR;  Service: Thoracic;  Laterality: N/A;  . Video assisted thoracoscopy (vats)/empyema Left 07/27/2014    Procedure:  VIDEO ASSISTED THORACOSCOPY (VATS)/EMPYEMA Left, drainage pleural effusion.;  Surgeon: [name], MD;  Location: [facility] OR;  Service: Thoracic;  Laterality: Left;   Family History  Problem Relation Age of Onset  . Osteoporosis Mother   . COPD Father    Social History  Substance Use Topics  . Smoking status: Former Smoker -- 1.00 packs/day    Types: Cigarettes    Quit date: 08/29/2012  . Smokeless tobacco: Never Used  . Alcohol Use: No    Review of Systems  Constitutional: Negative for fever.  Respiratory: Positive for shortness of breath.   Gastrointestinal: Positive for nausea, abdominal pain and constipation. Negative for vomiting.  All other systems reviewed and are negative.     Allergies  Bee venom  Home Medications   Prior to Admission medications   Medication Sig Start Date End Date Taking? Authorizing Provider  acetaminophen (TYLENOL) 500 MG tablet Take 1,000 mg by mouth every 6 (six) hours as needed for mild pain, moderate pain, fever or headache.   Yes Historical Provider, MD  citalopram (CELEXA) 40 MG tablet TAKE 1 TABLET BY MOUTH DAILY 05/09/15  Yes Marletta Lor, MD  EPINEPHrine 0.3 mg/0.3 mL IJ SOAJ injection Inject 0.3 mLs (0.3 mg total) into the muscle once. 08/08/14  Yes Marletta Lor, MD  hydrochlorothiazide (HYDRODIURIL) 25 MG tablet TAKE 1 TABLET BY MOUTH DAILY 05/09/15  Yes Marletta Lor, MD  ibuprofen (ADVIL,MOTRIN) 200 MG tablet Take 400 mg by mouth every 4 (four) hours as needed for fever, headache, moderate pain or cramping.    Yes Historical Provider, MD  polyethylene glycol (MIRALAX /  GLYCOLAX) packet Take 17 g by mouth daily as needed for mild constipation.   Yes Historical Provider, MD  senna (SENOKOT) 8.6 MG TABS tablet Take 1 tablet by mouth daily as needed for mild constipation.   Yes Historical Provider, MD   BP 123/69 mmHg  Pulse 86  Temp(Src) 97.8 F (36.6 C) (Oral)  Resp 19  Wt 71.895 kg  SpO2 87% Physical Exam    Constitutional: He is oriented to person, place, and time. He appears well-developed and well-nourished. No distress.  HENT:  Head: Normocephalic and atraumatic.  Eyes: Conjunctivae are normal.  Neck: Normal range of motion. Neck supple.  Cardiovascular: Normal rate, regular rhythm and normal heart sounds.   No murmur heard. Pulmonary/Chest: Effort normal. No respiratory distress.  Rhonchi at the right base and left middle lobes.  Abdominal: He exhibits distension. There is tenderness. There is no rebound and no guarding.    Mildly distended and heart and abdomen. Epigastric abdominal tenderness on exam. No peritoneal signs.  Musculoskeletal: Normal range of motion.  Neurological: He is alert and oriented to person, place, and time.  Skin: Skin is warm and dry. He is not diaphoretic.  Nursing note and vitals reviewed.   ED Course  Procedures (including critical care time) Labs Review Labs Reviewed  COMPREHENSIVE METABOLIC PANEL - Abnormal; Notable for the following:    Sodium 134 (*)    CO2 17 (*)    Glucose, Bld 104 (*)    BUN 21 (*)    Creatinine, Ser 1.68 (*)    Albumin 2.9 (*)    AST 336 (*)    ALT 276 (*)    Alkaline Phosphatase 482 (*)    GFR calc non Af Amer 41 (*)    GFR calc Af Amer 47 (*)    Anion gap 16 (*)    All other components within normal limits  CBC - Abnormal; Notable for the following:    WBC 17.0 (*)    Platelets 81 (*)    All other components within normal limits  I-STAT CG4 LACTIC ACID, ED - Abnormal; Notable for the following:    Lactic Acid, Venous 2.81 (*)    All other components within normal limits  POC OCCULT BLOOD, ED - Abnormal; Notable for the following:    Fecal Occult Bld POSITIVE (*)    All other components within normal limits  CULTURE, BLOOD (ROUTINE X 2)  CULTURE, BLOOD (ROUTINE X 2)  URINE CULTURE  LIPASE, BLOOD  URINALYSIS, ROUTINE W REFLEX MICROSCOPIC (NOT AT [facility])  I-STAT CG4 LACTIC ACID, ED    Imaging Review Ct  Abdomen Pelvis Wo Contrast  05/18/2015  CLINICAL DATA:  65 year old male with abdominal pelvic pain, nausea and right lower lung opacity identified on recent chest radiograph. EXAM: CT CHEST, ABDOMEN AND PELVIS WITHOUT CONTRAST TECHNIQUE: Multidetector CT imaging of the chest, abdomen and pelvis was performed following the standard protocol without IV contrast. COMPARISON:  05/18/2015 and prior radiographs.  07/26/2014 chest CT. FINDINGS: CT CHEST Mediastinum/Nodes: Multiple enlarged mediastinal and right hilar lymph nodes are identified. Index nodes include the following: A 3.8 x 3.8 cm right peritracheal node (image 25). A 3.4 x 4.6 cm sub carinal node (image 33). A 1.7 cm right hilar node (image 32). There is no evidence of pericardial effusion. Ectasia of the ascending aorta measuring 3.5 cm noted. Lungs/Pleura: An ill-defined 7 x 9 cm mass within the central right middle/ lower lobes identified extending to the right hilum. Nodular interstitial thickening  within the right lower lobe identified. Ill-defined ground-glass and nodular opacities within the right upper lobe are identified. A small right pleural effusion is noted with pleural nodularity. 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 There is no evidence of left pleural effusion. Emphysema is identified. Musculoskeletal: No acute or suspicious abnormalities identified. CT ABDOMEN AND PELVIS Please note that parenchymal abnormalities may be missed without intravenous contrast. Hepatobiliary: Multiple faint hypodense lesions within the liver are noted. The largest lesion measures 1.5 cm and anterior liver (image 57). The gallbladder is unremarkable. There is no evidence of biliary dilatation. Pancreas: Unremarkable Spleen: Unremarkable Adrenals/Urinary Tract: Mild bilateral renal atrophy noted. No definite mass, hydronephrosis urinary calculi noted. The bladder and adrenal glands are unremarkable.  Stomach/Bowel: Colonic diverticulosis noted without diverticulitis. No evidence bowel obstruction or definite focal bowel wall thickening. Vascular/Lymphatic: Enlarged retroperitoneal and upper abdominal lymph nodes identified. An index 1.8 x 2.1 cm right retroperitoneal node (image 68) noted. Aortic atherosclerotic calcifications noted without aneurysm. Reproductive: Mild prostate enlargement noted. Other: No free fluid, pneumoperitoneum or focal collection. Musculoskeletal: No acute or suspicious abnormalities identified. IMPRESSION: 7 x 9 cm central right middle lobe/lower lobe mass compatible with malignancy. Right lower lobe nodular interstitial thickening,, right upper lobe nodules, right pleural nodules with small right pleural effusion, enlarged mediastinal, right hilar and upper abdominal lymph nodes, and ill-defined hepatic lesions- compatible with metastatic disease. Electronically Signed   By: Margarette Canada M.D.   On: 05/18/2015 14:51   Dg Chest 2 View  05/18/2015  CLINICAL DATA:  Shortness of breath and dry cough for 1-1/2 weeks. Former smoker for 50 years. EXAM: CHEST  2 VIEW COMPARISON:  Chest x-rays dated 01/02/2015 and 09/19/2014. FINDINGS: New ill-defined airspace opacity within the anterior aspect of the right lower lobe, extending to the right hilum. Chronic scarring/ atelectasis noted at the left lung base. Lungs are hyperexpanded indicating some degree of COPD. Coarse interstitial markings are seen within both lungs which is likely a related chronic interstitial lung disease. Heart size is upper normal. IMPRESSION: New dense ill-defined airspace opacity within the right lower lobe, extending to the right hilum. Additional vague density at the right hilum and within the overlying medial suprahilar region. This could represent pneumonia or airspace collapse related to a central obstructing mass. Recommend chest CT with contrast for further characterization. Hyperexpanded lungs indicating  COPD/emphysema. Suspect associated chronic interstitial lung disease bilaterally. Electronically Signed   By: Franki Cabot M.D.   On: 05/18/2015 12:37   Dg Abd 1 View  05/18/2015  CLINICAL DATA:  Lower abdominal pain, constipation. EXAM: ABDOMEN - 1 VIEW COMPARISON:  None. FINDINGS: The bowel gas pattern is normal. Phleboliths are noted in the pelvis. Inferior tip of liver extends iliac wing suggesting hepatomegaly. IMPRESSION: No evidence of bowel obstruction or ileus.  Possible hepatomegaly. Electronically Signed   By: Marijo Conception, M.D.   On: 05/18/2015 11:20   Ct Chest Wo Contrast  05/18/2015  CLINICAL DATA:  65 year old male with abdominal pelvic pain, nausea and right lower lung opacity identified on recent chest radiograph. EXAM: CT CHEST, ABDOMEN AND PELVIS WITHOUT CONTRAST TECHNIQUE: Multidetector CT imaging of the chest, abdomen and pelvis was performed following the standard protocol without IV contrast. COMPARISON:  05/18/2015 and prior radiographs.  07/26/2014 chest CT. FINDINGS: CT CHEST Mediastinum/Nodes: Multiple enlarged mediastinal and right hilar lymph nodes are identified. Index nodes include the following: A 3.8 x 3.8 cm right peritracheal node (image 25). A  3.4 x 4.6 cm sub carinal node (image 33). A 1.7 cm right hilar node (image 32). There is no evidence of pericardial effusion. Ectasia of the ascending aorta measuring 3.5 cm noted. Lungs/Pleura: An ill-defined 7 x 9 cm mass within the central right middle/ lower lobes identified extending to the right hilum. Nodular interstitial thickening within the right lower lobe identified. Ill-defined ground-glass and nodular opacities within the right upper lobe are identified. A small right pleural effusion is noted with pleural nodularity. 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 There is no evidence of left pleural effusion. Emphysema is identified. Musculoskeletal: No acute or  suspicious abnormalities identified. CT ABDOMEN AND PELVIS Please note that parenchymal abnormalities may be missed without intravenous contrast. Hepatobiliary: Multiple faint hypodense lesions within the liver are noted. The largest lesion measures 1.5 cm and anterior liver (image 57). The gallbladder is unremarkable. There is no evidence of biliary dilatation. Pancreas: Unremarkable Spleen: Unremarkable Adrenals/Urinary Tract: Mild bilateral renal atrophy noted. No definite mass, hydronephrosis urinary calculi noted. The bladder and adrenal glands are unremarkable. Stomach/Bowel: Colonic diverticulosis noted without diverticulitis. No evidence bowel obstruction or definite focal bowel wall thickening. Vascular/Lymphatic: Enlarged retroperitoneal and upper abdominal lymph nodes identified. An index 1.8 x 2.1 cm right retroperitoneal node (image 68) noted. Aortic atherosclerotic calcifications noted without aneurysm. Reproductive: Mild prostate enlargement noted. Other: No free fluid, pneumoperitoneum or focal collection. Musculoskeletal: No acute or suspicious abnormalities identified. IMPRESSION: 7 x 9 cm central right middle lobe/lower lobe mass compatible with malignancy. Right lower lobe nodular interstitial thickening,, right upper lobe nodules, right pleural nodules with small right pleural effusion, enlarged mediastinal, right hilar and upper abdominal lymph nodes, and ill-defined hepatic lesions- compatible with metastatic disease. Electronically Signed   By: Margarette Canada M.D.   On: 05/18/2015 14:51   I have personally reviewed and evaluated these images and lab results as part of my medical decision-making.   EKG Interpretation   Date/Time:  Sunday May 18 2015 11:55:13 EDT Ventricular Rate:  77 PR Interval:  161 QRS Duration: 85 QT Interval:  436 QTC Calculation: 493 R Axis:   53 Text Interpretation:  Sinus rhythm Low voltage, precordial leads  Borderline prolonged QT interval No  significant change was found Confirmed  by Wyvonnia Dusky  MD, STEPHEN [phone]) on 05/18/2015 12:04:32 PM      MDM   Final diagnoses:  Thrombocytopenia (HCC)  Elevated liver enzymes  Hypoxia  Lung mass  Patient presents for abdominal pain and constipation 4 weeks. He is hypotensive on arrival 88/62. He meets sepsis criteria with WBC of 17. He is a daily alcohol drinker. He has elevated LFTs. Anion gap is 16. Differential: Bowel Obstruction, constipation, alcoholic ketoacidosis. Also has thrombocytopenia with platelet count of 81. Patient is dehydrated with BUN of 21 and creatinine of 1.68. He appears stable in no acute distress at this time. I discussed this patient with Dr. Wyvonnia Dusky who has seen and evaluated the patient. Patient had pneumonia in June 2016 with pleural effusion that was drained. Chest x-ray today shows right lower lobe opacity extending to the right hilum. Could be pneumonia versus obstructing mass. He has some lymphadenopathy. Abdominal x-ray is negative for constipation or evidence of bowel obstruction or ileus. Possible hepatosplenomegaly. Hemoccult is positive.   I discussed findings with the patient and family who agree with admission.  I spoke to Luane School with [facility] who works with Dr. Dia Crawford regarding the patient's hypoxia and new  onset shortness of breath with diagnosis of malignant lung tumor and hypoxia.      Ottie Glazier, PA-C 05/18/15 [place], MD 05/18/15 [phone]

## 2015-05-18 NOTE — Progress Notes (Signed)
Pharmacy Code Sepsis Protocol  Time of code sepsis page: 1158 '[]'$  Antibiotics delivered at  '[]'$  Antibiotics administered prior to code at  (if checked, omit next 2 questions)  Were antibiotics ordered at the time of the code sepsis page? No Was it required to contact the physician? '[]'$  Physician not contacted '[x]'$  Physician contacted to order antibiotics for code sepsis - PA does not think it is related to an infection and does not want antibiotics at this time.  '[]'$  Physician contacted to recommend changing antibiotics   Anti-infectives    None        Nurse education provided: N/A '[]'$  Minutes left to administer antibiotics to achieve 1 hour goal '[]'$  Correct order of antibiotic administration '[]'$  Antibiotic Y-site compatibilities     Tydus Sanmiguel, Rande Lawman, PharmD 05/18/2015, 12:15 PM

## 2015-05-19 ENCOUNTER — Inpatient Hospital Stay (HOSPITAL_COMMUNITY): Payer: Medicare Other

## 2015-05-19 DIAGNOSIS — K5900 Constipation, unspecified: Secondary | ICD-10-CM | POA: Diagnosis present

## 2015-05-19 DIAGNOSIS — N179 Acute kidney failure, unspecified: Secondary | ICD-10-CM | POA: Diagnosis present

## 2015-05-19 DIAGNOSIS — R918 Other nonspecific abnormal finding of lung field: Secondary | ICD-10-CM | POA: Insufficient documentation

## 2015-05-19 DIAGNOSIS — C3490 Malignant neoplasm of unspecified part of unspecified bronchus or lung: Secondary | ICD-10-CM | POA: Insufficient documentation

## 2015-05-19 DIAGNOSIS — D72829 Elevated white blood cell count, unspecified: Secondary | ICD-10-CM | POA: Diagnosis present

## 2015-05-19 DIAGNOSIS — J90 Pleural effusion, not elsewhere classified: Secondary | ICD-10-CM | POA: Diagnosis present

## 2015-05-19 DIAGNOSIS — J449 Chronic obstructive pulmonary disease, unspecified: Secondary | ICD-10-CM | POA: Diagnosis present

## 2015-05-19 DIAGNOSIS — Z87891 Personal history of nicotine dependence: Secondary | ICD-10-CM | POA: Diagnosis not present

## 2015-05-19 DIAGNOSIS — J9601 Acute respiratory failure with hypoxia: Secondary | ICD-10-CM | POA: Diagnosis present

## 2015-05-19 DIAGNOSIS — K7689 Other specified diseases of liver: Secondary | ICD-10-CM

## 2015-05-19 DIAGNOSIS — R7989 Other specified abnormal findings of blood chemistry: Secondary | ICD-10-CM | POA: Diagnosis not present

## 2015-05-19 DIAGNOSIS — R591 Generalized enlarged lymph nodes: Secondary | ICD-10-CM

## 2015-05-19 DIAGNOSIS — Z79899 Other long term (current) drug therapy: Secondary | ICD-10-CM | POA: Diagnosis not present

## 2015-05-19 DIAGNOSIS — E785 Hyperlipidemia, unspecified: Secondary | ICD-10-CM | POA: Diagnosis present

## 2015-05-19 DIAGNOSIS — R590 Localized enlarged lymph nodes: Secondary | ICD-10-CM | POA: Diagnosis present

## 2015-05-19 DIAGNOSIS — I10 Essential (primary) hypertension: Secondary | ICD-10-CM | POA: Diagnosis present

## 2015-05-19 DIAGNOSIS — C3491 Malignant neoplasm of unspecified part of right bronchus or lung: Secondary | ICD-10-CM

## 2015-05-19 DIAGNOSIS — K769 Liver disease, unspecified: Secondary | ICD-10-CM | POA: Diagnosis present

## 2015-05-19 DIAGNOSIS — R109 Unspecified abdominal pain: Secondary | ICD-10-CM | POA: Diagnosis present

## 2015-05-19 DIAGNOSIS — Z85828 Personal history of other malignant neoplasm of skin: Secondary | ICD-10-CM | POA: Diagnosis not present

## 2015-05-19 DIAGNOSIS — K219 Gastro-esophageal reflux disease without esophagitis: Secondary | ICD-10-CM | POA: Diagnosis present

## 2015-05-19 LAB — LACTATE DEHYDROGENASE, PLEURAL OR PERITONEAL FLUID: LD FL: 197 U/L — AB (ref 3–23)

## 2015-05-19 LAB — BASIC METABOLIC PANEL
ANION GAP: 11 (ref 5–15)
BUN: 25 mg/dL — AB (ref 6–20)
CHLORIDE: 108 mmol/L (ref 101–111)
CO2: 18 mmol/L — ABNORMAL LOW (ref 22–32)
Calcium: 8.3 mg/dL — ABNORMAL LOW (ref 8.9–10.3)
Creatinine, Ser: 1.68 mg/dL — ABNORMAL HIGH (ref 0.61–1.24)
GFR, EST AFRICAN AMERICAN: 47 mL/min — AB (ref >60)
GFR, EST NON AFRICAN AMERICAN: 41 mL/min — AB (ref >60)
Glucose, Bld: 73 mg/dL (ref 65–99)
Potassium: 4.1 mmol/L (ref 3.5–5.1)
Sodium: 137 mmol/L (ref 135–145)

## 2015-05-19 LAB — GRAM STAIN

## 2015-05-19 LAB — CBC
HEMATOCRIT: 32.8 % — AB (ref 39.0–52.0)
HEMOGLOBIN: 11.2 g/dL — AB (ref 13.0–17.0)
MCH: 32.6 pg (ref 26.0–34.0)
MCHC: 34.1 g/dL (ref 30.0–36.0)
MCV: 95.3 fL (ref 78.0–100.0)
Platelets: 69 10*3/uL — ABNORMAL LOW (ref 150–400)
RBC: 3.44 MIL/uL — AB (ref 4.22–5.81)
RDW: 14.3 % (ref 11.5–15.5)
WBC: 14.4 10*3/uL — AB (ref 4.0–10.5)

## 2015-05-19 LAB — BODY FLUID CELL COUNT WITH DIFFERENTIAL
EOS FL: 0 %
Lymphs, Fluid: 83 %
Monocyte-Macrophage-Serous Fluid: 9 % — ABNORMAL LOW (ref 50–90)
Neutrophil Count, Fluid: 8 % (ref 0–25)
Total Nucleated Cell Count, Fluid: 586 cu mm (ref 0–1000)

## 2015-05-19 LAB — PROTEIN, BODY FLUID: Total protein, fluid: <3 g/dL

## 2015-05-19 LAB — LACTATE DEHYDROGENASE: LDH: 896 U/L — ABNORMAL HIGH (ref 98–192)

## 2015-05-19 LAB — URINE CULTURE: Culture: NO GROWTH

## 2015-05-19 LAB — PROTEIN, TOTAL: Total Protein: 5.4 g/dL — ABNORMAL LOW (ref 6.5–8.1)

## 2015-05-19 MED ORDER — HYDROMORPHONE HCL 1 MG/ML IJ SOLN
2.0000 mg | INTRAMUSCULAR | Status: DC | PRN
  Administered 2015-05-19 – 2015-05-20 (×3): 2 mg via INTRAVENOUS
  Filled 2015-05-19 (×3): qty 2

## 2015-05-19 MED ORDER — LIDOCAINE HCL (PF) 1 % IJ SOLN
INTRAMUSCULAR | Status: AC
  Filled 2015-05-19: qty 10

## 2015-05-19 MED ORDER — ALBUTEROL SULFATE (2.5 MG/3ML) 0.083% IN NEBU: 2.5000 mg | INHALATION_SOLUTION | RESPIRATORY_TRACT | Status: DC | PRN

## 2015-05-19 NOTE — Care Management Note (Signed)
Case Management Note  Patient Details  Name: CAMILLO QUADROS MRN: [id number] Date of Birth: 1950/04/12  Subjective/Objective:                    Action/Plan:  Continue to follow for discharge needs  Expected Discharge Date:                  Expected Discharge Plan:  Home/Self Care  In-House Referral:     Discharge planning Services     Post Acute Care Choice:    Choice offered to:     DME Arranged:    DME Agency:     HH Arranged:    [place] Agency:     Status of Service:     Medicare Important Message Given:    Date Medicare IM Given:    Medicare IM give by:    Date Additional Medicare IM Given:    Additional Medicare Important Message give by:     If discussed at [place] of Stay Meetings, dates discussed:    Additional Comments:  Marilu Favre, RN 05/19/2015, 2:29 PM

## 2015-05-19 NOTE — Procedures (Signed)
Successful US guided right thoracentesis. Yielded 827m of hazy, dark yellow fluid. Pt tolerated procedure well. No immediate complications.  Specimen was sent for labs. CXR ordered.  Incidentally, limited [place] the liver finds numerous lesions suspicious for mets.  BAscencion DikePA-C 05/19/2015 2:10 PM

## 2015-05-19 NOTE — Progress Notes (Addendum)
[facility] PROGRESS NOTE  [name] VHQ:[id number] DOB: [birth year] DOA: 05/18/2015 PCP: [name], MD  Assessment/Plan: 1. Suspected lung cancer. -Shane Vasquez is a 65 year old gentleman with a history of COPD and tobacco abuse, who had been hospitalized in June 2016 for pneumonia, complicated by empyema. During that time he underwent VATS, with sample sent to pathology. Path report from 07/27/2014 stating no malignant cells identified. -He presented with complaints of shortness of breath and abdominal pain, CT scan of lungs now showing a 7 x 9 cm central right middle lobe/lower lobe mass concerning for malignancy. In addition to this he was found to have liver lesions concerning for metastatic disease. -Case discussed with both interventional radiology and pulmonary critical care medicine. Interventional radiology could perform ultrasound-guided thoracentesis recommending PCCM consultation for bronchoscopy.  -He will need follow-up at the cancer center once biopsies are obtained  2.  Abdominal pain -Patient presenting with complaints of abdominal pain located in the lower abdominal quadrant.  -CT scan of abdomen and pelvis showing hepatic lesions within the liver concerning for metastatic disease. There is no evidence of biliary dilatation. There is no evidence of bowel obstruction or focal bowel wall thickening. -It is possible that abdominal pain could be related to metastatic disease involving liver, however, on physical exam he didn't appear to have significant right upper quadrant tenderness appearing that most of his pain was coming from lower abdominal region. -Provide as needed narcotic analgesics  3.  Acute respiratory failure -Evidence by an O2 sat of 87% having new oxygen requirement and presenting in respiratory distress -I suspect secondary to pulmonary mass -Saturations improved with supplemental oxygen  4.  Elevated transaminases. -Lab work showing an AST  of 336 with ALT of 276, total bilirubin 0.9. -Likely secondary to metastatic involvement of liver  5.  Acute kidney injury -Labs showing creatinine 1.68 with BUN of 25, I suspect secondary to prerenal azotemia having multiple episodes of diarrhea prior to presentation. -Hydrochlorothiazide discontinued -Continue IV fluid resuscitation with normal saline running at 100 mL/hour  6.  History of hypertension. -Patient having lower blood pressures which could be a reflection of hypovolemia. HCTZ was discontinued -Continue the IV fluids  Code Status: Full code Family Communication: I spoke to his brother Disposition Plan: Plan for biopsy   Consultants:  Pulmonary critical care medicine  Interventional radiology  Procedures:  Ultrasound-guided thoracentesis performed on 05/19/2015  Antibiotics:    HPI/Subjective: Shane Plush is a 65 year old gentleman with a past medical history of tobacco abuse, chronic obstructive pulmonary disease, who was hospitalized from 07/21/2014 through 41/32/4401 for complicated pneumonia with empyema requiring VATS. He had samples sent for pathology which did not reveal evidence of malignancy per path report dated 07/27/2014. Shane Rennels presented to the emergency department on 05/18/2015 with complaints of shortness of breath associate with abdominal pain. A CT scan of chest revealed a 7 x 9 cm central right middle lobe/lower lobe mass concerning for malignancy. In addition he was found to have ill-defined hepatic lesions concerning for metastatic disease. Interventional radiology and pulmonary critical care medicine were consulted regarding obtaining biopsy.   Objective: Filed Vitals:   05/19/15 1306 05/19/15 1320  BP: 94/51 94/48  Pulse:    Temp:    Resp:      Intake/Output Summary (Last 24 hours) at 05/19/15 1404 Last data filed at 05/19/15 0549  Gross per 24 hour  Intake 686.25 ml  Output    400 ml  Net 286.25 ml   [employer]  05/18/15  0952  Weight: 71.895 kg (158 lb 8 oz)    Exam:   General:  Nontoxic appearing, awake and alert  Cardiovascular: Regular rate and rhythm normal S1-S2 no murmurs rubs or gallops  Respiratory: Positive right sided crackles and rhonchi, normal respiratory effort on room air  Abdomen: Soft nontender nondistended  Musculoskeletal: No edema  Data Reviewed: Basic Metabolic Panel:  Recent Labs Lab 05/18/15 0916 05/19/15 0631  NA 134* 137  K 3.8 4.1  CL 101 108  CO2 17* 18*  GLUCOSE 104* 73  BUN 21* 25*  CREATININE 1.68* 1.68*  CALCIUM 9.5 8.3*   Liver Function Tests:  Recent Labs Lab 05/18/15 0916 05/19/15 0631  AST 336*  --   ALT 276*  --   ALKPHOS 482*  --   BILITOT 0.9  --   PROT 6.8 5.4*  ALBUMIN 2.9*  --     Recent Labs Lab 05/18/15 0916  LIPASE 30   No results for input(s): AMMONIA in the last 168 hours. CBC:  Recent Labs Lab 05/18/15 0916 05/19/15 0631  WBC 17.0* 14.4*  HGB 14.4 11.2*  HCT 41.9 32.8*  MCV 93.5 95.3  PLT 81* 69*   Cardiac Enzymes: No results for input(s): CKTOTAL, CKMB, CKMBINDEX, TROPONINI in the last 168 hours. BNP (last 3 results) No results for input(s): BNP in the last 8760 hours.  ProBNP (last 3 results) No results for input(s): PROBNP in the last 8760 hours.  CBG: No results for input(s): GLUCAP in the last 168 hours.  Recent Results (from the past 240 hour(s))  Blood Culture (routine x 2)     Status: None (Preliminary result)   Collection Time: 05/18/15 10:37 AM  Result Value Ref Range Status   Specimen Description BLOOD LEFT ANTECUBITAL  Final   Special Requests BOTTLES DRAWN AEROBIC AND ANAEROBIC 5CC  Final   Culture NO GROWTH < 24 HOURS  Final   Report Status PENDING  Incomplete  Blood Culture (routine x 2)     Status: None (Preliminary result)   Collection Time: 05/18/15 10:41 AM  Result Value Ref Range Status   Specimen Description BLOOD RIGHT HAND  Final   Special Requests BOTTLES DRAWN AEROBIC AND  ANAEROBIC 5CC  Final   Culture NO GROWTH < 24 HOURS  Final   Report Status PENDING  Incomplete  Urine culture     Status: None   Collection Time: 05/18/15  3:07 PM  Result Value Ref Range Status   Specimen Description URINE, CLEAN CATCH  Final   Special Requests NONE  Final   Culture NO GROWTH 1 DAY  Final   Report Status 05/19/2015 FINAL  Final     Studies: Ct Abdomen Pelvis Wo Contrast  05/18/2015  CLINICAL DATA:  65 year old male with abdominal pelvic pain, nausea and right lower lung opacity identified on recent chest radiograph. EXAM: CT CHEST, ABDOMEN AND PELVIS WITHOUT CONTRAST TECHNIQUE: Multidetector CT imaging of the chest, abdomen and pelvis was performed following the standard protocol without IV contrast. COMPARISON:  05/18/2015 and prior radiographs.  07/26/2014 chest CT. FINDINGS: CT CHEST Mediastinum/Nodes: Multiple enlarged mediastinal and right hilar lymph nodes are identified. Index nodes include the following: A 3.8 x 3.8 cm right peritracheal node (image 25). A 3.4 x 4.6 cm sub carinal node (image 33). A 1.7 cm right hilar node (image 32). There is no evidence of pericardial effusion. Ectasia of the ascending aorta measuring 3.5 cm noted. Lungs/Pleura: An ill-defined 7 x 9 cm mass within  the central right middle/ lower lobes identified extending to the right hilum. Nodular interstitial thickening within the right lower lobe identified. Ill-defined ground-glass and nodular opacities within the right upper lobe are identified. A small right pleural effusion is noted with pleural nodularity. 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 There is no evidence of left pleural effusion. Emphysema is identified. Musculoskeletal: No acute or suspicious abnormalities identified. CT ABDOMEN AND PELVIS Please note that parenchymal abnormalities may be missed without intravenous contrast. Hepatobiliary: Multiple faint hypodense lesions within the  liver are noted. The largest lesion measures 1.5 cm and anterior liver (image 57). The gallbladder is unremarkable. There is no evidence of biliary dilatation. Pancreas: Unremarkable Spleen: Unremarkable Adrenals/Urinary Tract: Mild bilateral renal atrophy noted. No definite mass, hydronephrosis urinary calculi noted. The bladder and adrenal glands are unremarkable. Stomach/Bowel: Colonic diverticulosis noted without diverticulitis. No evidence bowel obstruction or definite focal bowel wall thickening. Vascular/Lymphatic: Enlarged retroperitoneal and upper abdominal lymph nodes identified. An index 1.8 x 2.1 cm right retroperitoneal node (image 68) noted. Aortic atherosclerotic calcifications noted without aneurysm. Reproductive: Mild prostate enlargement noted. Other: No free fluid, pneumoperitoneum or focal collection. Musculoskeletal: No acute or suspicious abnormalities identified. IMPRESSION: 7 x 9 cm central right middle lobe/lower lobe mass compatible with malignancy. Right lower lobe nodular interstitial thickening,, right upper lobe nodules, right pleural nodules with small right pleural effusion, enlarged mediastinal, right hilar and upper abdominal lymph nodes, and ill-defined hepatic lesions- compatible with metastatic disease. Electronically Signed   By: Margarette Canada M.D.   On: 05/18/2015 14:51   Dg Chest 2 View  05/18/2015  CLINICAL DATA:  Shortness of breath and dry cough for 1-1/2 weeks. Former smoker for 50 years. EXAM: CHEST  2 VIEW COMPARISON:  Chest x-rays dated 01/02/2015 and 09/19/2014. FINDINGS: New ill-defined airspace opacity within the anterior aspect of the right lower lobe, extending to the right hilum. Chronic scarring/ atelectasis noted at the left lung base. Lungs are hyperexpanded indicating some degree of COPD. Coarse interstitial markings are seen within both lungs which is likely a related chronic interstitial lung disease. Heart size is upper normal. IMPRESSION: New dense  ill-defined airspace opacity within the right lower lobe, extending to the right hilum. Additional vague density at the right hilum and within the overlying medial suprahilar region. This could represent pneumonia or airspace collapse related to a central obstructing mass. Recommend chest CT with contrast for further characterization. Hyperexpanded lungs indicating COPD/emphysema. Suspect associated chronic interstitial lung disease bilaterally. Electronically Signed   By: Franki Cabot M.D.   On: 05/18/2015 12:37   Dg Abd 1 View  05/18/2015  CLINICAL DATA:  Lower abdominal pain, constipation. EXAM: ABDOMEN - 1 VIEW COMPARISON:  None. FINDINGS: The bowel gas pattern is normal. Phleboliths are noted in the pelvis. Inferior tip of liver extends iliac wing suggesting hepatomegaly. IMPRESSION: No evidence of bowel obstruction or ileus.  Possible hepatomegaly. Electronically Signed   By: Marijo Conception, M.D.   On: 05/18/2015 11:20   Ct Chest Wo Contrast  05/18/2015  CLINICAL DATA:  65 year old male with abdominal pelvic pain, nausea and right lower lung opacity identified on recent chest radiograph. EXAM: CT CHEST, ABDOMEN AND PELVIS WITHOUT CONTRAST TECHNIQUE: Multidetector CT imaging of the chest, abdomen and pelvis was performed following the standard protocol without IV contrast. COMPARISON:  05/18/2015 and prior radiographs.  07/26/2014 chest CT. FINDINGS: CT CHEST Mediastinum/Nodes: Multiple enlarged mediastinal and right hilar lymph nodes are identified. Index  nodes include the following: A 3.8 x 3.8 cm right peritracheal node (image 25). A 3.4 x 4.6 cm sub carinal node (image 33). A 1.7 cm right hilar node (image 32). There is no evidence of pericardial effusion. Ectasia of the ascending aorta measuring 3.5 cm noted. Lungs/Pleura: An ill-defined 7 x 9 cm mass within the central right middle/ lower lobes identified extending to the right hilum. Nodular interstitial thickening within the right lower lobe  identified. Ill-defined ground-glass and nodular opacities within the right upper lobe are identified. A small right pleural effusion is noted with pleural nodularity. 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 There is no evidence of left pleural effusion. Emphysema is identified. Musculoskeletal: No acute or suspicious abnormalities identified. CT ABDOMEN AND PELVIS Please note that parenchymal abnormalities may be missed without intravenous contrast. Hepatobiliary: Multiple faint hypodense lesions within the liver are noted. The largest lesion measures 1.5 cm and anterior liver (image 57). The gallbladder is unremarkable. There is no evidence of biliary dilatation. Pancreas: Unremarkable Spleen: Unremarkable Adrenals/Urinary Tract: Mild bilateral renal atrophy noted. No definite mass, hydronephrosis urinary calculi noted. The bladder and adrenal glands are unremarkable. Stomach/Bowel: Colonic diverticulosis noted without diverticulitis. No evidence bowel obstruction or definite focal bowel wall thickening. Vascular/Lymphatic: Enlarged retroperitoneal and upper abdominal lymph nodes identified. An index 1.8 x 2.1 cm right retroperitoneal node (image 68) noted. Aortic atherosclerotic calcifications noted without aneurysm. Reproductive: Mild prostate enlargement noted. Other: No free fluid, pneumoperitoneum or focal collection. Musculoskeletal: No acute or suspicious abnormalities identified. IMPRESSION: 7 x 9 cm central right middle lobe/lower lobe mass compatible with malignancy. Right lower lobe nodular interstitial thickening,, right upper lobe nodules, right pleural nodules with small right pleural effusion, enlarged mediastinal, right hilar and upper abdominal lymph nodes, and ill-defined hepatic lesions- compatible with metastatic disease. Electronically Signed   By: Margarette Canada M.D.   On: 05/18/2015 14:51    Scheduled Meds: . citalopram  20 mg Oral  Daily  . lidocaine (PF)       Continuous Infusions: . sodium chloride 100 mL/hr at 05/19/15 1037    Principal Problem:   Acute respiratory failure with hypoxia (HCC) Active Problems:   Essential hypertension   GERD   Shortness of breath   Pleural effusion   Leukocytosis   Acute respiratory failure (HCC)   Liver lesion   Elevated LFTs   Neoplasm of lung   Enlarged lymph nodes   Lung mass    Time spent: 35 min    Kelvin Cellar  [facility] Pager [phone]. If 7PM-7AM, please contact night-coverage at [email or web address], password [facility] 05/19/2015, 2:04 PM  LOS: 0 days

## 2015-05-19 NOTE — Consult Note (Signed)
Name: RIYAN HAILE MRN: [id number] DOB: 1950/05/13    ADMISSION DATE:  05/18/2015 CONSULTATION DATE:  05/19/15  REFERRING MD :  Coralyn Pear  CHIEF COMPLAINT:  Abd pain   HISTORY OF PRESENT ILLNESS:  GORJE IYER is a 65 y.o. male with a PMH as outlined below.  He presented to [facility] 05/18/15 with abdominal pain x 4 weeks.  He first had constipation which he reports is very strange for him since he typically has a BM daily.  Symptoms persisted and worsened to the point where he began to have pain even after successful BM following laxative use.  He then developed SOB and non-productive cough.  Denied any fevers/chills/sweats, headache, chest pain, N/V/D, myalgias.  In ED, CT of the abdomen and chest was obtained which revealed 7 x 9cm central RML/RLL mass compatible with malignancy, RLL nodular interstitial thickening, RUL nodules, small right pleural effusion, enlarged mediastinal LN's, right hilar and upper abdominal lymph nodes, and ill defined hepatic lesions compatible with metastatic disease.  He has roughly a 50 pack year smoking history and also reports an 8 - 10 pound weight loss.  He does state that he quit drinking beers roughly 1 month ago and therefore attributes his weight loss to this.  Denies any hemoptysis.  PCCM was consulted 04/03 for further recs.  PAST MEDICAL HISTORY :   has a past medical history of CERVICAL RADICULOPATHY, RIGHT (08/12/2009); DIVERTICULOSIS, COLON (08/15/2006); GERD (08/15/2006); HYPERLIPIDEMIA (08/15/2006); HYPERTENSION (08/15/2006); INTERNAL HEMORRHOIDS (02/29/2008); SKIN CANCER, HX OF (08/15/2006); CAP (community acquired pneumonia); and Empyema lung ([place]).  has past surgical history that includes Tonsillectomy; Video bronchoscopy (N/A, 07/27/2014); and Video assisted thoracoscopy (vats)/empyema (Left, 07/27/2014). Prior to Admission medications   Medication Sig Start Date End Date Taking? Authorizing Provider  acetaminophen (TYLENOL) 500 MG tablet Take 1,000 mg by  mouth every 6 (six) hours as needed for mild pain, moderate pain, fever or headache.   Yes Historical Provider, MD  citalopram (CELEXA) 40 MG tablet TAKE 1 TABLET BY MOUTH DAILY 05/09/15  Yes Marletta Lor, MD  EPINEPHrine 0.3 mg/0.3 mL IJ SOAJ injection Inject 0.3 mLs (0.3 mg total) into the muscle once. 08/08/14  Yes Marletta Lor, MD  hydrochlorothiazide (HYDRODIURIL) 25 MG tablet TAKE 1 TABLET BY MOUTH DAILY 05/09/15  Yes Marletta Lor, MD  ibuprofen (ADVIL,MOTRIN) 200 MG tablet Take 400 mg by mouth every 4 (four) hours as needed for fever, headache, moderate pain or cramping.    Yes Historical Provider, MD  polyethylene glycol (MIRALAX / GLYCOLAX) packet Take 17 g by mouth daily as needed for mild constipation.   Yes Historical Provider, MD  senna (SENOKOT) 8.6 MG TABS tablet Take 1 tablet by mouth daily as needed for mild constipation.   Yes Historical Provider, MD   Allergies  Allergen Reactions  . Bee Venom Anaphylaxis and Nausea And Vomiting    FAMILY HISTORY:  family history includes COPD in his father; Osteoporosis in his mother. SOCIAL HISTORY:  reports that he quit smoking about 2 years ago. His smoking use included Cigarettes. He smoked 1.00 pack per day. He has never used smokeless tobacco. He reports that he does not drink alcohol or use illicit drugs.  REVIEW OF SYSTEMS:   All negative; except for those that are bolded, which indicate positives.  Constitutional: weight loss, weight gain, night sweats, fevers, chills, fatigue, weakness.  HEENT: headaches, sore throat, sneezing, nasal congestion, post nasal drip, difficulty swallowing, tooth/dental problems, visual complaints, visual changes, ear aches. Neuro:  difficulty with speech, weakness, numbness, ataxia. CV:  chest pain, orthopnea, PND, swelling in lower extremities, dizziness, palpitations, syncope.  Resp: cough, hemoptysis, dyspnea, wheezing. GI  heartburn, indigestion, abdominal pain, nausea,  vomiting, diarrhea, constipation, change in bowel habits, loss of appetite, hematemesis, melena, hematochezia.  GU: dysuria, change in color of urine, urgency or frequency, flank pain, hematuria. MSK: joint pain or swelling, decreased range of motion. Psych: change in mood or affect, depression, anxiety, suicidal ideations, homicidal ideations. Skin: rash, itching, bruising.    SUBJECTIVE:  Denies any SOB at rest.  Abd pain currently improved.  VITAL SIGNS: Temp:  [97.9 F (36.6 C)-98.4 F (36.9 C)] 98.4 F (36.9 C) (04/03 0546) Pulse Rate:  [74-89] 81 (04/03 0546) Resp:  [13-20] 18 (04/03 0546) BP: (98-126)/(55-75) 111/55 mmHg (04/03 0546) SpO2:  [86 %-94 %] 92 % (04/03 0546)  PHYSICAL EXAMINATION: General: Adult male, resting in bed, in NAD. Neuro: A&O x 3, non-focal.  HEENT: [place]/AT. PERRL, sclerae anicteric. Cardiovascular: RRR, no M/R/G.  Lungs: Respirations even and unlabored.  CTA bilaterally, No W/R/R. Abdomen: BS x 4, soft, NT/ND.  Musculoskeletal: No gross deformities, no edema.  Skin: Intact, warm, no rashes.     Recent Labs Lab 05/18/15 0916 05/19/15 0631  NA 134* 137  K 3.8 4.1  CL 101 108  CO2 17* 18*  BUN 21* 25*  CREATININE 1.68* 1.68*  GLUCOSE 104* 73    Recent Labs Lab 05/18/15 0916 05/19/15 0631  HGB 14.4 11.2*  HCT 41.9 32.8*  WBC 17.0* 14.4*  PLT 81* 69*   Ct Abdomen Pelvis Wo Contrast  05/18/2015  CLINICAL DATA:  65 year old male with abdominal pelvic pain, nausea and right lower lung opacity identified on recent chest radiograph. EXAM: CT CHEST, ABDOMEN AND PELVIS WITHOUT CONTRAST TECHNIQUE: Multidetector CT imaging of the chest, abdomen and pelvis was performed following the standard protocol without IV contrast. COMPARISON:  05/18/2015 and prior radiographs.  07/26/2014 chest CT. FINDINGS: CT CHEST Mediastinum/Nodes: Multiple enlarged mediastinal and right hilar lymph nodes are identified. Index nodes include the following: A 3.8 x 3.8 cm  right peritracheal node (image 25). A 3.4 x 4.6 cm sub carinal node (image 33). A 1.7 cm right hilar node (image 32). There is no evidence of pericardial effusion. Ectasia of the ascending aorta measuring 3.5 cm noted. Lungs/Pleura: An ill-defined 7 x 9 cm mass within the central right middle/ lower lobes identified extending to the right hilum. Nodular interstitial thickening within the right lower lobe identified. Ill-defined ground-glass and nodular opacities within the right upper lobe are identified. A small right pleural effusion is noted with pleural nodularity. 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 There is no evidence of left pleural effusion. Emphysema is identified. Musculoskeletal: No acute or suspicious abnormalities identified. CT ABDOMEN AND PELVIS Please note that parenchymal abnormalities may be missed without intravenous contrast. Hepatobiliary: Multiple faint hypodense lesions within the liver are noted. The largest lesion measures 1.5 cm and anterior liver (image 57). The gallbladder is unremarkable. There is no evidence of biliary dilatation. Pancreas: Unremarkable Spleen: Unremarkable Adrenals/Urinary Tract: Mild bilateral renal atrophy noted. No definite mass, hydronephrosis urinary calculi noted. The bladder and adrenal glands are unremarkable. Stomach/Bowel: Colonic diverticulosis noted without diverticulitis. No evidence bowel obstruction or definite focal bowel wall thickening. Vascular/Lymphatic: Enlarged retroperitoneal and upper abdominal lymph nodes identified. An index 1.8 x 2.1 cm right retroperitoneal node (image 68) noted. Aortic atherosclerotic calcifications noted without aneurysm. Reproductive: Mild prostate enlargement  noted. Other: No free fluid, pneumoperitoneum or focal collection. Musculoskeletal: No acute or suspicious abnormalities identified. IMPRESSION: 7 x 9 cm central right middle lobe/lower lobe mass  compatible with malignancy. Right lower lobe nodular interstitial thickening,, right upper lobe nodules, right pleural nodules with small right pleural effusion, enlarged mediastinal, right hilar and upper abdominal lymph nodes, and ill-defined hepatic lesions- compatible with metastatic disease. Electronically Signed   By: Margarette Canada M.D.   On: 05/18/2015 14:51   Dg Chest 2 View  05/18/2015  CLINICAL DATA:  Shortness of breath and dry cough for 1-1/2 weeks. Former smoker for 50 years. EXAM: CHEST  2 VIEW COMPARISON:  Chest x-rays dated 01/02/2015 and 09/19/2014. FINDINGS: New ill-defined airspace opacity within the anterior aspect of the right lower lobe, extending to the right hilum. Chronic scarring/ atelectasis noted at the left lung base. Lungs are hyperexpanded indicating some degree of COPD. Coarse interstitial markings are seen within both lungs which is likely a related chronic interstitial lung disease. Heart size is upper normal. IMPRESSION: New dense ill-defined airspace opacity within the right lower lobe, extending to the right hilum. Additional vague density at the right hilum and within the overlying medial suprahilar region. This could represent pneumonia or airspace collapse related to a central obstructing mass. Recommend chest CT with contrast for further characterization. Hyperexpanded lungs indicating COPD/emphysema. Suspect associated chronic interstitial lung disease bilaterally. Electronically Signed   By: Franki Cabot M.D.   On: 05/18/2015 12:37   Dg Abd 1 View  05/18/2015  CLINICAL DATA:  Lower abdominal pain, constipation. EXAM: ABDOMEN - 1 VIEW COMPARISON:  None. FINDINGS: The bowel gas pattern is normal. Phleboliths are noted in the pelvis. Inferior tip of liver extends iliac wing suggesting hepatomegaly. IMPRESSION: No evidence of bowel obstruction or ileus.  Possible hepatomegaly. Electronically Signed   By: Marijo Conception, M.D.   On: 05/18/2015 11:20   Ct Chest Wo  Contrast  05/18/2015  CLINICAL DATA:  65 year old male with abdominal pelvic pain, nausea and right lower lung opacity identified on recent chest radiograph. EXAM: CT CHEST, ABDOMEN AND PELVIS WITHOUT CONTRAST TECHNIQUE: Multidetector CT imaging of the chest, abdomen and pelvis was performed following the standard protocol without IV contrast. COMPARISON:  05/18/2015 and prior radiographs.  07/26/2014 chest CT. FINDINGS: CT CHEST Mediastinum/Nodes: Multiple enlarged mediastinal and right hilar lymph nodes are identified. Index nodes include the following: A 3.8 x 3.8 cm right peritracheal node (image 25). A 3.4 x 4.6 cm sub carinal node (image 33). A 1.7 cm right hilar node (image 32). There is no evidence of pericardial effusion. Ectasia of the ascending aorta measuring 3.5 cm noted. Lungs/Pleura: An ill-defined 7 x 9 cm mass within the central right middle/ lower lobes identified extending to the right hilum. Nodular interstitial thickening within the right lower lobe identified. Ill-defined ground-glass and nodular opacities within the right upper lobe are identified. A small right pleural effusion is noted with pleural nodularity. 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 There is no evidence of left pleural effusion. Emphysema is identified. Musculoskeletal: No acute or suspicious abnormalities identified. CT ABDOMEN AND PELVIS Please note that parenchymal abnormalities may be missed without intravenous contrast. Hepatobiliary: Multiple faint hypodense lesions within the liver are noted. The largest lesion measures 1.5 cm and anterior liver (image 57). The gallbladder is unremarkable. There is no evidence of biliary dilatation. Pancreas: Unremarkable Spleen: Unremarkable Adrenals/Urinary Tract: Mild bilateral renal atrophy noted. No definite mass, hydronephrosis  urinary calculi noted. The bladder and adrenal glands are unremarkable. Stomach/Bowel: Colonic  diverticulosis noted without diverticulitis. No evidence bowel obstruction or definite focal bowel wall thickening. Vascular/Lymphatic: Enlarged retroperitoneal and upper abdominal lymph nodes identified. An index 1.8 x 2.1 cm right retroperitoneal node (image 68) noted. Aortic atherosclerotic calcifications noted without aneurysm. Reproductive: Mild prostate enlargement noted. Other: No free fluid, pneumoperitoneum or focal collection. Musculoskeletal: No acute or suspicious abnormalities identified. IMPRESSION: 7 x 9 cm central right middle lobe/lower lobe mass compatible with malignancy. Right lower lobe nodular interstitial thickening,, right upper lobe nodules, right pleural nodules with small right pleural effusion, enlarged mediastinal, right hilar and upper abdominal lymph nodes, and ill-defined hepatic lesions- compatible with metastatic disease. Electronically Signed   By: Margarette Canada M.D.   On: 05/18/2015 14:51    STUDIES:  CT A / P / chest 04/02 > 7 x 9cm central RML/RLL mass compatible with malignancy, RLL nodular interstitial thickening, RUL nodules, small right pleural effusion, enlarged mediastinal LN's, right hilar and upper abdominal lymph nodes, and ill defined hepatic lesions compatible with metastatic disease.  SIGNIFICANT EVENTS  04/02 > admit 04/03 > PCCM consulted.  ASSESSMENT / PLAN:  Large central RML / RLL mass with associated pulmonary nodules, pleural effusion, lymphadenopathy, and additional hepatic lesions - pt has long smoking history along with weight loss which certainly is highly suspicious for malignancy with metastatic disease.  He is undergoing thoracentesis today 05/19/15. Plan: Planning for FOB under conscious sedation tomorrow 05/20/15 at 11:00. NPO after midnight for above procedure. Will likely need further imaging and oncology consult.  Acute hypoxic respiratory failure. Plan: Continue supplemental O2 as needed to maintain SpO2 > 92%. Albuterol  PRN.   Rest per primary team.   Montey Hora, [place] [facility] Pager: [phone]  or [phone] 05/19/2015, 11:23 AM    Attending Note:  I have examined patient, reviewed labs, studies and notes. I have discussed the case with [place], and I agree with the data and plans as amended above. Pt examined, Ct chest reviewed. Pleural fluid obtained 4/3 > lymphocyte predominant, transudate. I believe best way to approach expedient tissue dx is via FOB with probable endobronchial biopsies. Will plan for today at 11:00. Procedure discussed with the patient.    Baltazar Apo, MD, PhD 05/20/2015, 8:23 AM [facility] [phone] or if no answer [phone]

## 2015-05-19 NOTE — Progress Notes (Signed)
IR is asked to eval for tissue diagnosis by means of right lung mass biopsy as well as thoracentesis D/w attending MD earlier today and suggested PCCM consult for consideration of bronchoscopy with bx vs outpt PET for further workup. Offered thoracentesis as an initial diagnostic and therapeutic procedure. Review of PCCM consult note suggests they are planning FOB with bx tomorrow.  Imaging was reviewed by Dr. Vernard Gambles. Non-contrasted CT of chest shows predominantly central RML/RLL mass with extensive associated adenopathy. CT abdomen shows multiple faint hepatic lesions suspicious for mets.  Thora performed, see separate note and dictation for details. Incidental finding of several small pleural nodules and numerous liver lesions by [place] suggestive of mets.  Cytology now pending on effusion, await results.   Shane Dike PA-C Interventional Radiology 05/19/2015 2:23 PM

## 2015-05-20 ENCOUNTER — Inpatient Hospital Stay (HOSPITAL_COMMUNITY): Payer: Medicare Other

## 2015-05-20 ENCOUNTER — Encounter (HOSPITAL_COMMUNITY)
Admission: EM | Disposition: A | Discharge status: Home / Self Care | Payer: Self-pay | Source: Home / Self Care | Attending: Internal Medicine

## 2015-05-20 ENCOUNTER — Encounter (HOSPITAL_COMMUNITY): Payer: Self-pay | Admitting: Emergency Medicine

## 2015-05-20 ENCOUNTER — Telehealth: Payer: Self-pay | Admitting: Internal Medicine

## 2015-05-20 HISTORY — PX: VIDEO BRONCHOSCOPY: SHX5072

## 2015-05-20 LAB — BASIC METABOLIC PANEL
ANION GAP: 12 (ref 5–15)
BUN: 27 mg/dL — ABNORMAL HIGH (ref 6–20)
CHLORIDE: 106 mmol/L (ref 101–111)
CO2: 20 mmol/L — AB (ref 22–32)
CREATININE: 1.47 mg/dL — AB (ref 0.61–1.24)
Calcium: 8.2 mg/dL — ABNORMAL LOW (ref 8.9–10.3)
GFR calc non Af Amer: 48 mL/min — ABNORMAL LOW (ref >60)
GFR, EST AFRICAN AMERICAN: 56 mL/min — AB (ref >60)
Glucose, Bld: 109 mg/dL — ABNORMAL HIGH (ref 65–99)
Potassium: 3.8 mmol/L (ref 3.5–5.1)
Sodium: 138 mmol/L (ref 135–145)

## 2015-05-20 LAB — CBC
HCT: 30.8 % — ABNORMAL LOW (ref 39.0–52.0)
HEMOGLOBIN: 10.3 g/dL — AB (ref 13.0–17.0)
MCH: 31.3 pg (ref 26.0–34.0)
MCHC: 33.4 g/dL (ref 30.0–36.0)
MCV: 93.6 fL (ref 78.0–100.0)
Platelets: 67 10*3/uL — ABNORMAL LOW (ref 150–400)
RBC: 3.29 MIL/uL — AB (ref 4.22–5.81)
RDW: 14.4 % (ref 11.5–15.5)
WBC: 13.8 10*3/uL — ABNORMAL HIGH (ref 4.0–10.5)

## 2015-05-20 LAB — PROTIME-INR
INR: 1.25 (ref 0.00–1.49)
Prothrombin Time: 15.9 seconds — ABNORMAL HIGH (ref 11.6–15.2)

## 2015-05-20 LAB — PH, BODY FLUID: pH, Body Fluid: 8

## 2015-05-20 SURGERY — BRONCHOSCOPY, WITH FLUOROSCOPY
Anesthesia: Moderate Sedation | Laterality: Bilateral

## 2015-05-20 MED ORDER — SODIUM CHLORIDE 0.9 % IV SOLN: INTRAVENOUS | Status: DC

## 2015-05-20 MED ORDER — MIDAZOLAM HCL 5 MG/ML IJ SOLN
INTRAMUSCULAR | Status: AC
  Filled 2015-05-20: qty 2

## 2015-05-20 MED ORDER — FLEET ENEMA 7-19 GM/118ML RE ENEM
1.0000 | ENEMA | Freq: Once | RECTAL | Status: AC
  Administered 2015-05-20: 1 via RECTAL
  Filled 2015-05-20: qty 1

## 2015-05-20 MED ORDER — DOCUSATE SODIUM 100 MG PO CAPS: 100.0000 mg | ORAL_CAPSULE | Freq: Every day | ORAL | Status: DC

## 2015-05-20 MED ORDER — DOCUSATE SODIUM 100 MG PO CAPS
100.0000 mg | ORAL_CAPSULE | Freq: Every day | ORAL | Status: DC
  Administered 2015-05-20: 100 mg via ORAL
  Filled 2015-05-20: qty 1

## 2015-05-20 MED ORDER — POLYETHYLENE GLYCOL 3350 17 G PO PACK
17.0000 g | PACK | Freq: Every day | ORAL | Status: DC
  Administered 2015-05-20: 17 g via ORAL
  Filled 2015-05-20: qty 1

## 2015-05-20 MED ORDER — FENTANYL CITRATE (PF) 100 MCG/2ML IJ SOLN
INTRAMUSCULAR | Status: AC
  Filled 2015-05-20: qty 4

## 2015-05-20 MED ORDER — BISACODYL 10 MG RE SUPP
10.0000 mg | Freq: Once | RECTAL | Status: AC
  Administered 2015-05-20: 10 mg via RECTAL
  Filled 2015-05-20: qty 1

## 2015-05-20 MED ORDER — FENTANYL CITRATE (PF) 100 MCG/2ML IJ SOLN
INTRAMUSCULAR | Status: DC | PRN
  Administered 2015-05-20: 50 ug via INTRAVENOUS

## 2015-05-20 MED ORDER — MIDAZOLAM HCL 10 MG/2ML IJ SOLN
INTRAMUSCULAR | Status: DC | PRN
  Administered 2015-05-20: 3 mg via INTRAVENOUS

## 2015-05-20 MED ORDER — ALUM & MAG HYDROXIDE-SIMETH 200-200-20 MG/5ML PO SUSP
30.0000 mL | Freq: Four times a day (QID) | ORAL | Status: DC | PRN
  Administered 2015-05-20: 30 mL via ORAL
  Filled 2015-05-20: qty 30

## 2015-05-20 MED ORDER — LIDOCAINE HCL (PF) 1 % IJ SOLN
INTRAMUSCULAR | Status: DC | PRN
  Administered 2015-05-20: 6 mL

## 2015-05-20 MED ORDER — LIDOCAINE HCL 2 % EX GEL
CUTANEOUS | Status: DC | PRN
  Administered 2015-05-20: 1

## 2015-05-20 MED ORDER — PHENYLEPHRINE HCL 0.25 % NA SOLN
NASAL | Status: DC | PRN
  Administered 2015-05-20: 2 via NASAL

## 2015-05-20 MED ORDER — OXYCODONE HCL 5 MG PO TABS
5.0000 mg | ORAL_TABLET | ORAL | Status: DC | PRN
Start: 2015-05-20 — End: 2015-05-22

## 2015-05-20 NOTE — Progress Notes (Signed)
Shane Vasquez to be D/C'd  per MD order. Discussed with the patient and all questions fully answered.  VSS, Skin clean, dry and intact without evidence of skin break down, no evidence of skin tears noted.  IV catheter discontinued intact. Site without signs and symptoms of complications. Dressing and pressure applied.  An After Visit Summary was printed and given to the patient. Patient received prescription.  D/c education completed with patient/family including follow up instructions, medication list, d/c activities limitations if indicated, with other d/c instructions as indicated by MD - patient able to verbalize understanding, all questions fully answered.   Patient instructed to return to ED, call 911, or call MD for any changes in condition.   Patient to be escorted via [place], and D/C home via private auto.

## 2015-05-20 NOTE — Progress Notes (Signed)
Video bronchoscopy with brushing intervention, biospy intervention. All vitals good thru out procedure. RN on floor called Shane Vasquez and given report. Pt. Sent back on 4 lpm

## 2015-05-20 NOTE — Discharge Summary (Signed)
Physician Discharge Summary  Shane Vasquez MEQ:[id number] DOB: 1951-01-07 DOA: 05/18/2015  PCP: [name], MD  Admit date: 05/18/2015 Discharge date: 05/20/2015  Time spent: 35 minutes  Recommendations for Outpatient Follow-up:  1. Shane Vasquez found to have a lung mass with CT scan findings concerning for metastatic involvement of liver, undergoing bronchoscopy during this hospitalization. He was given follow-up appointment at the cancer center with Dr.Sherrill to review pathology results.   Discharge Diagnoses:  Principal Problem:   Acute respiratory failure with hypoxia (HCC) Active Problems:   Essential hypertension   GERD   Shortness of breath   Pleural effusion   Leukocytosis   Acute respiratory failure (HCC)   Liver lesion   Elevated LFTs   Neoplasm of lung   Enlarged lymph nodes   Lung mass   Metastatic lung cancer (metastasis from lung to other site) [facility])   Discharge Condition: Stable  Diet recommendation: Regular diet  Filed Weights   05/18/15 0952 05/20/15 0438 05/20/15 1024  Weight: 71.895 kg (158 lb 8 oz) 78.336 kg (172 lb 11.2 oz) 73.483 kg (162 lb)    History of present illness:  Shane Vasquez is a 65 y.o. WM PMHx lipidemia, hypertension, left lower lobe pneumonia with empyema in June 2016 since to emergency department with the chief complaint of abdominal pain and gradual worsening shortness of breath with exertion. Initial evaluation in the emergency department reveals acute respiratory failure with hypoxia.  Information is obtained from the patient reports a very gradual worsening dyspnea with exertion over the last 3-4 weeks. Associated symptoms include least nonproductive cough. He denies chest pain palpitation headache dizziness syncope or near-syncope. He denies any fever chills lower extremity edema or orthopnea. He reports he recovered fully from his pneumonia last year and did not require supplemental oxygen. In addition suffered with  intermittent constipation and abdominal distention. He reports nausea without vomiting. He reports no unintentional weight loss. He reports his last bowel movement was yesterday it was small and watery. He denies any dark stools or bright red blood per rectum.  In the emergency department he is afebrile hypotensive and decision saturation level of 88% on room air. Provided with oxygen supplementation and IV fluids. At the time of admission blood pressure 100/69 oxygen saturation level 93% on 2 L nasal cannula  Hospital Course:  Shane Vasquez is a 65 year old gentleman with a past medical history of tobacco abuse, chronic obstructive pulmonary disease, who was hospitalized from 07/21/2014 through 29/79/8921 for complicated pneumonia with empyema requiring VATS. He had samples sent for pathology which did not reveal evidence of malignancy per path report dated 07/27/2014. Shane Vasquez presented to the emergency department on 05/18/2015 with complaints of shortness of breath associate with abdominal pain. A CT scan of chest revealed a 7 x 9 cm central right middle lobe/lower lobe mass concerning for malignancy. In addition he was found to have ill-defined hepatic lesions concerning for metastatic disease. Interventional radiology and pulmonary critical care medicine were consulted regarding obtaining biopsy. On 04/19/2015 he underwent bronchoscopy, procedure performed by Dr. Lamonte Sakai of pulmonary critical care medicine. On bronchial sounds have an exophytic and raised mass at the right mainstem bronchus in the bronchus intermedius and in right middle lobe having appearance of malignancy. Endobronchial biopsies were performed. I discussed case with Dr Benay Spice of medical oncology who will set Shane Vasquez up with oncology appointment at the cancer center this Thursday, 05/22/2015 to go over pathology results. With regard to his abdominal pain I wonder if  this related to metastatic involvement of liver. This also could be  related to constipation reporting he had been 3-4 days since his last bowel movement. On 05/20/2015 he expressed wishes to be discharged from the hospital, stated feeling well enough to go home and following up in the outpatient setting. He reported tolerating by mouth intake. Prior to discharge she was given Dulcolax suppository. He was discharged on 05/20/2015 in stable condition.  Procedures:  Bronchoscopy performed on 05/20/2015 Impression: - Right middle lobe mass  - Normal nasopharynx/oropharynx.  - Arytenoid edema suspected to be secondary to   gastroesophageal reflux disease (GERD) was found.  - The left lung was normal.  - An exophytic and raised mass was found in the right   mainstem bronchus, in the bronchus intermedius and in   the right middle lobe. 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  - Brushings were obtained.  - Endobronchial biopsies performed.  Consultations:  Pulmonary critical care medicine  Interventional radiology  Telephone consultation made to medical oncology  Discharge Exam: Filed Vitals:   05/20/15 1304 05/20/15 1358  BP: 110/70 113/63  Pulse: 88 76  Temp: 98.7 F (37.1 C) 98.7 F (37.1 C)  Resp: 18 18     General: Nontoxic appearing, awake and alert  Cardiovascular: Regular rate and rhythm normal S1-S2 no murmurs rubs or gallops  Respiratory: Positive right sided crackles and rhonchi, normal respiratory effort on room air  Abdomen: Soft nontender nondistended Musculoskeletal: No edema  Discharge Instructions   Discharge Instructions    Call MD for:  difficulty breathing, headache or visual disturbances    Complete by:  As directed      Call MD for:  extreme fatigue    Complete by:  As directed      Call MD for:  hives    Complete by:  As directed       Call MD for:  persistant dizziness or light-headedness    Complete by:  As directed      Call MD for:  persistant nausea and vomiting    Complete by:  As directed      Call MD for:  redness, tenderness, or signs of infection (pain, swelling, redness, odor or green/yellow discharge around incision site)    Complete by:  As directed      Call MD for:  severe uncontrolled pain    Complete by:  As directed      Call MD for:  temperature >100.4    Complete by:  As directed      Call MD for:    Complete by:  As directed      Diet - low sodium heart healthy    Complete by:  As directed      Increase activity slowly    Complete by:  As directed           Current Discharge Medication List    START taking these medications   Details  docusate sodium (COLACE) 100 MG capsule Take 1 capsule (100 mg total) by mouth daily. Qty: 10 capsule, Refills: 0    oxyCODONE (ROXICODONE) 5 MG immediate release tablet Take 1 tablet (5 mg total) by mouth every 4 (four) hours as needed for severe pain. Qty: 20 tablet, Refills: 0      CONTINUE these medications which have NOT CHANGED   Details  citalopram (CELEXA) 40 MG tablet TAKE 1 TABLET BY MOUTH DAILY Qty: 90 tablet, Refills: 1    EPINEPHrine 0.3 mg/0.3 mL IJ  SOAJ injection Inject 0.3 mLs (0.3 mg total) into the muscle once. Qty: 1 Device, Refills: 2    polyethylene glycol (MIRALAX / GLYCOLAX) packet Take 17 g by mouth daily as needed for mild constipation.    senna (SENOKOT) 8.6 MG TABS tablet Take 1 tablet by mouth daily as needed for mild constipation.      STOP taking these medications     acetaminophen (TYLENOL) 500 MG tablet      hydrochlorothiazide (HYDRODIURIL) 25 MG tablet      ibuprofen (ADVIL,MOTRIN) 200 MG tablet        Allergies  Allergen Reactions  . Bee Venom Anaphylaxis and Nausea And Vomiting   Follow-up Information    Follow up with [name], MD In 2 weeks.   Specialty:  Internal Medicine    Contact information:   [place] [address] [phone]       Follow up with [name] Coder, MD In 2 days.   Specialty:  Oncology   Contact information:   [place] [address] [phone]        The results of significant diagnostics from this hospitalization (including imaging, microbiology, ancillary and laboratory) are listed below for reference.    Significant Diagnostic Studies: Ct Abdomen Pelvis Wo Contrast  05/18/2015  CLINICAL DATA:  65 year old male with abdominal pelvic pain, nausea and right lower lung opacity identified on recent chest radiograph. EXAM: CT CHEST, ABDOMEN AND PELVIS WITHOUT CONTRAST TECHNIQUE: Multidetector CT imaging of the chest, abdomen and pelvis was performed following the standard protocol without IV contrast. COMPARISON:  05/18/2015 and prior radiographs.  07/26/2014 chest CT. FINDINGS: CT CHEST Mediastinum/Nodes: Multiple enlarged mediastinal and right hilar lymph nodes are identified. Index nodes include the following: A 3.8 x 3.8 cm right peritracheal node (image 25). A 3.4 x 4.6 cm sub carinal node (image 33). A 1.7 cm right hilar node (image 32). There is no evidence of pericardial effusion. Ectasia of the ascending aorta measuring 3.5 cm noted. Lungs/Pleura: An ill-defined 7 x 9 cm mass within the central right middle/ lower lobes identified extending to the right hilum. Nodular interstitial thickening within the right lower lobe identified. Ill-defined ground-glass and nodular opacities within the right upper lobe are identified. A small right pleural effusion is noted with pleural nodularity. 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 There is no evidence of left pleural effusion. Emphysema is identified. Musculoskeletal: No acute or suspicious abnormalities identified. CT ABDOMEN AND PELVIS Please note that parenchymal abnormalities may be missed without  intravenous contrast. Hepatobiliary: Multiple faint hypodense lesions within the liver are noted. The largest lesion measures 1.5 cm and anterior liver (image 57). The gallbladder is unremarkable. There is no evidence of biliary dilatation. Pancreas: Unremarkable Spleen: Unremarkable Adrenals/Urinary Tract: Mild bilateral renal atrophy noted. No definite mass, hydronephrosis urinary calculi noted. The bladder and adrenal glands are unremarkable. Stomach/Bowel: Colonic diverticulosis noted without diverticulitis. No evidence bowel obstruction or definite focal bowel wall thickening. Vascular/Lymphatic: Enlarged retroperitoneal and upper abdominal lymph nodes identified. An index 1.8 x 2.1 cm right retroperitoneal node (image 68) noted. Aortic atherosclerotic calcifications noted without aneurysm. Reproductive: Mild prostate enlargement noted. Other: No free fluid, pneumoperitoneum or focal collection. Musculoskeletal: No acute or suspicious abnormalities identified. IMPRESSION: 7 x 9 cm central right middle lobe/lower lobe mass compatible with malignancy. Right lower lobe nodular interstitial thickening,, right upper lobe nodules, right pleural nodules with small right pleural effusion, enlarged mediastinal, right  hilar and upper abdominal lymph nodes, and ill-defined hepatic lesions- compatible with metastatic disease. Electronically Signed   By: Margarette Canada M.D.   On: 05/18/2015 14:51   Dg Chest 1 View  05/19/2015  CLINICAL DATA:  Status post right-sided thoracentesis EXAM: CHEST 1 VIEW COMPARISON:  May 18, 2015 chest CT and chest radiograph FINDINGS: No pneumothorax. There has been near complete resolution of pleural effusion on the right. There is persistent right middle mass with adjacent lobe airspace consolidation. There is scarring and atelectatic change in the left base. The heart size and pulmonary vascularity are within normal limits. Multiple foci of adenopathy are noted throughout the mediastinum  superiorly and on the right. IMPRESSION: No pneumothorax. Essentially complete resolution of pleural effusion on the right. Mass with airspace consolidation remains on the right, unchanged. Extensive adenopathy present. Scarring and atelectatic change are noted in the left base. Electronically Signed   By: Lowella Grip III M.D.   On: 05/19/2015 14:03   Dg Chest 2 View  05/18/2015  CLINICAL DATA:  Shortness of breath and dry cough for 1-1/2 weeks. Former smoker for 50 years. EXAM: CHEST  2 VIEW COMPARISON:  Chest x-rays dated 01/02/2015 and 09/19/2014. FINDINGS: New ill-defined airspace opacity within the anterior aspect of the right lower lobe, extending to the right hilum. Chronic scarring/ atelectasis noted at the left lung base. Lungs are hyperexpanded indicating some degree of COPD. Coarse interstitial markings are seen within both lungs which is likely a related chronic interstitial lung disease. Heart size is upper normal. IMPRESSION: New dense ill-defined airspace opacity within the right lower lobe, extending to the right hilum. Additional vague density at the right hilum and within the overlying medial suprahilar region. This could represent pneumonia or airspace collapse related to a central obstructing mass. Recommend chest CT with contrast for further characterization. Hyperexpanded lungs indicating COPD/emphysema. Suspect associated chronic interstitial lung disease bilaterally. Electronically Signed   By: Franki Cabot M.D.   On: 05/18/2015 12:37   Dg Abd 1 View  05/18/2015  CLINICAL DATA:  Lower abdominal pain, constipation. EXAM: ABDOMEN - 1 VIEW COMPARISON:  None. FINDINGS: The bowel gas pattern is normal. Phleboliths are noted in the pelvis. Inferior tip of liver extends iliac wing suggesting hepatomegaly. IMPRESSION: No evidence of bowel obstruction or ileus.  Possible hepatomegaly. Electronically Signed   By: Marijo Conception, M.D.   On: 05/18/2015 11:20   Ct Chest Wo  Contrast  05/18/2015  CLINICAL DATA:  65 year old male with abdominal pelvic pain, nausea and right lower lung opacity identified on recent chest radiograph. EXAM: CT CHEST, ABDOMEN AND PELVIS WITHOUT CONTRAST TECHNIQUE: Multidetector CT imaging of the chest, abdomen and pelvis was performed following the standard protocol without IV contrast. COMPARISON:  05/18/2015 and prior radiographs.  07/26/2014 chest CT. FINDINGS: CT CHEST Mediastinum/Nodes: Multiple enlarged mediastinal and right hilar lymph nodes are identified. Index nodes include the following: A 3.8 x 3.8 cm right peritracheal node (image 25). A 3.4 x 4.6 cm sub carinal node (image 33). A 1.7 cm right hilar node (image 32). There is no evidence of pericardial effusion. Ectasia of the ascending aorta measuring 3.5 cm noted. Lungs/Pleura: An ill-defined 7 x 9 cm mass within the central right middle/ lower lobes identified extending to the right hilum. Nodular interstitial thickening within the right lower lobe identified. Ill-defined ground-glass and nodular opacities within the right upper lobe are identified. A small right pleural effusion is noted with pleural nodularity. These findings are compatible with  pulmonary malignancy with pleural and lymph node metastases. There is no evidence of left pleural effusion. Emphysema is identified. Musculoskeletal: No acute or suspicious abnormalities identified. CT ABDOMEN AND PELVIS Please note that parenchymal abnormalities may be missed without intravenous contrast. Hepatobiliary: Multiple faint hypodense lesions within the liver are noted. The largest lesion measures 1.5 cm and anterior liver (image 57). The gallbladder is unremarkable. There is no evidence of biliary dilatation. Pancreas: Unremarkable Spleen: Unremarkable Adrenals/Urinary Tract: Mild bilateral renal atrophy noted. No definite mass, hydronephrosis urinary calculi noted. The bladder and adrenal glands are unremarkable. Stomach/Bowel: Colonic  diverticulosis noted without diverticulitis. No evidence bowel obstruction or definite focal bowel wall thickening. Vascular/Lymphatic: Enlarged retroperitoneal and upper abdominal lymph nodes identified. An index 1.8 x 2.1 cm right retroperitoneal node (image 68) noted. Aortic atherosclerotic calcifications noted without aneurysm. Reproductive: Mild prostate enlargement noted. Other: No free fluid, pneumoperitoneum or focal collection. Musculoskeletal: No acute or suspicious abnormalities identified. IMPRESSION: 7 x 9 cm central right middle lobe/lower lobe mass compatible with malignancy. Right lower lobe nodular interstitial thickening,, right upper lobe nodules, right pleural nodules with small right pleural effusion, enlarged mediastinal, right hilar and upper abdominal lymph nodes, and ill-defined hepatic lesions- compatible with metastatic disease. Electronically Signed   By: Margarette Canada M.D.   On: 05/18/2015 14:51   US Thoracentesis Asp Pleural Space W/img Guide  05/19/2015  INDICATION: Newly diagnosed right lung mass with associated pleural effusion. Request diagnostic and therapeutic thoracentesis EXAM: ULTRASOUND GUIDED RIGHT THORACENTESIS MEDICATIONS: None. COMPLICATIONS: None immediate. PROCEDURE: An ultrasound guided thoracentesis was thoroughly discussed with the patient and questions answered. The benefits, risks, alternatives and complications were also discussed. The patient understands and wishes to proceed with the procedure. Written consent was obtained. Ultrasound was performed to localize and mark an adequate pocket of fluid in the right chest. The area was then prepped and draped in the normal sterile fashion. 1% Lidocaine was used for local anesthesia. Under ultrasound guidance a Safe-T-Centesis catheter was introduced. Thoracentesis was performed. The catheter was removed and a dressing applied. FINDINGS: A total of approximately 830 mL of hazy, dark yellow fluid was removed. Samples were  sent to the laboratory as requested by the clinical team. Limited ultrasound imaging of the liver during this procedure finds numerous lesions suspicious for metastatic disease. IMPRESSION: Successful ultrasound guided right thoracentesis yielding 830 mL of pleural fluid. Read by: Ascencion Dike PA-C Electronically Signed   By: Lucrezia Europe M.D.   On: 05/19/2015 14:26    Microbiology: Recent Results (from the past 240 hour(s))  Blood Culture (routine x 2)     Status: None (Preliminary result)   Collection Time: 05/18/15 10:37 AM  Result Value Ref Range Status   Specimen Description BLOOD LEFT ANTECUBITAL  Final   Special Requests BOTTLES DRAWN AEROBIC AND ANAEROBIC 5CC  Final   Culture NO GROWTH 2 DAYS  Final   Report Status PENDING  Incomplete  Blood Culture (routine x 2)     Status: None (Preliminary result)   Collection Time: 05/18/15 10:41 AM  Result Value Ref Range Status   Specimen Description BLOOD RIGHT HAND  Final   Special Requests BOTTLES DRAWN AEROBIC AND ANAEROBIC 5CC  Final   Culture NO GROWTH 2 DAYS  Final   Report Status PENDING  Incomplete  Urine culture     Status: None   Collection Time: 05/18/15  3:07 PM  Result Value Ref Range Status   Specimen Description URINE, CLEAN CATCH  Final  Special Requests NONE  Final   Culture NO GROWTH 1 DAY  Final   Report Status 05/19/2015 FINAL  Final  Culture, body fluid-bottle     Status: None (Preliminary result)   Collection Time: 05/19/15  1:12 PM  Result Value Ref Range Status   Specimen Description FLUID RIGHT PLEURAL  Final   Special Requests NONE  Final   Culture NO GROWTH < 24 HOURS  Final   Report Status PENDING  Incomplete  Gram stain     Status: None   Collection Time: 05/19/15  1:12 PM  Result Value Ref Range Status   Specimen Description FLUID RIGHT PLEURAL  Final   Special Requests NONE  Final   Gram Stain   Final    MODERATE WBC PRESENT,BOTH PMN AND MONONUCLEAR NO ORGANISMS SEEN    Report Status 05/19/2015  FINAL  Final     Labs: Basic Metabolic Panel:  Recent Labs Lab 05/18/15 0916 05/19/15 0631 05/20/15 0321  NA 134* 137 138  K 3.8 4.1 3.8  CL 101 108 106  CO2 17* 18* 20*  GLUCOSE 104* 73 109*  BUN 21* 25* 27*  CREATININE 1.68* 1.68* 1.47*  CALCIUM 9.5 8.3* 8.2*   Liver Function Tests:  Recent Labs Lab 05/18/15 0916 05/19/15 0631  AST 336*  --   ALT 276*  --   ALKPHOS 482*  --   BILITOT 0.9  --   PROT 6.8 5.4*  ALBUMIN 2.9*  --     Recent Labs Lab 05/18/15 0916  LIPASE 30   No results for input(s): AMMONIA in the last 168 hours. CBC:  Recent Labs Lab 05/18/15 0916 05/19/15 0631 05/20/15 0321  WBC 17.0* 14.4* 13.8*  HGB 14.4 11.2* 10.3*  HCT 41.9 32.8* 30.8*  MCV 93.5 95.3 93.6  PLT 81* 69* 67*   Cardiac Enzymes: No results for input(s): CKTOTAL, CKMB, CKMBINDEX, TROPONINI in the last 168 hours. BNP: BNP (last 3 results) No results for input(s): BNP in the last 8760 hours.  ProBNP (last 3 results) No results for input(s): PROBNP in the last 8760 hours.  CBG: No results for input(s): GLUCAP in the last 168 hours.     Signed:  Kelvin Cellar MD.  [facility] 05/20/2015, 4:11 PM

## 2015-05-20 NOTE — Telephone Encounter (Signed)
Dr.K, see message.

## 2015-05-20 NOTE — Progress Notes (Signed)
SATURATION QUALIFICATIONS: (This note is used to comply with regulatory documentation for home oxygen)  Patient Saturations on Room Air at Rest = 87  Patient Saturations on Room Air while Ambulating =86

## 2015-05-20 NOTE — Op Note (Signed)
[facility] Cardiopulmonary Patient Name: Shane Vasquez Date: 05/20/2015 MRN: [id number] Attending MD: Collene Gobble , MD Date of Birth: 1950/12/10 CSN: Finalized Age: 65 Admit Type: Inpatient Gender: Male Procedure:            Bronchoscopy Indications:          Right middle lobe mass Providers:            Collene Gobble, MD, Doris Cheadle RRT,RCP, Tammie                        Readling RRT,RCP Referring MD:          Medicines:            Lidocaine applied to nares and subglottic space,                        Fentanyl 100 mcg IV, Midazolam 3 mg mg IV, Lidocaine 1%                        applied to cords 5 mL, Oxygen 6 L/min Complications:        No immediate complications Estimated Blood Loss: Estimated blood loss was minimal. Procedure:            Pre-Anesthesia Assessment:                       - A History and Physical has been performed. Patient                        meds and allergies have been reviewed. The risks and                        benefits of the procedure and the sedation options and                        risks were discussed with the patient. All questions                        were answered and informed consent was obtained.                        Patient identification and proposed procedure were                        verified prior to the procedure by the physician in the                        procedure room. Mental Status Examination: normal.                        Airway Examination: normal oropharyngeal airway.                        Respiratory Examination: poor air movement in the right                        lung. CV Examination: RRR, no murmurs, no S3 or S4. ASA                        Grade Assessment: II -  A patient with mild systemic                        disease. After reviewing the risks and benefits, the                        patient was deemed in satisfactory condition to undergo                        the procedure.  The anesthesia plan was to use moderate                        sedation / analgesia (conscious sedation). Immediately                        prior to administration of medications, the patient was                        re-assessed for adequacy to receive sedatives. The                        heart rate, respiratory rate, oxygen saturations, blood                        pressure, adequacy of pulmonary ventilation, and                        response to care were monitored throughout the                        procedure. The physical status of the patient was                        re-assessed after the procedure.                       After obtaining informed consent, the bronchoscope was                        passed under direct vision. Throughout the procedure,                        the patient's blood pressure, pulse, and oxygen                        saturations were monitored continuously. the [id number]                        [id number] scope was introduced through the left nostril                        and advanced to the tracheobronchial tree of both lungs. Scope In: 11:23:00 AM Scope Out: 11:42:00 AM Findings:      Nasal passageway: The nasal passageway is normal.      Nasopharynx/oropharynx: The nasopharynx and oropharynx are normal.      Larynx: GERD findings were visualized including arytenoid edema.      Trachea/Carina Abnormalities:      The nasopharynx/oropharynx appears normal. The larynx appears normal.       The vocal cords appear normal. The subglottic space is normal.  The       trachea is of normal caliber. The carina is sharp. The tracheobronchial       tree of the left lung was examined to at least the first subsegmental       level. Bronchial mucosa and anatomy in the left lung are normal; there       are no endobronchial lesions, and no secretions.      Right Lung Abnormalities: A non-obstructing mass was found 1 cm from the       bifurcation (carina) in the right  mainstem bronchus, extending along the       medial wall into the the bronchus intermedius and finally in the right       middle lobe. The RML oriface was narrowed and unpassable. The RUL       airways and RLL airways were patent and uninvolved.      Brushings of a mass were obtained in the right mainstem bronchus, in the       bronchus intermedius and in the right middle lobe with a cytology brush       and sent for routine cytology.      Endobronchial biopsies of a mass were performed in the bronchus       intermedius and in the right middle lobe using a forceps and sent for       histopathology examination.      Estimated blood loss: 5 mL. Impression:           - Right middle lobe mass                       - Normal nasopharynx/oropharynx.                       - Arytenoid edema suspected to be secondary to                        gastroesophageal reflux disease (GERD) was found.                       - The left lung was normal.                       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                       - Brushings were obtained.                       - Endobronchial biopsies performed. Moderate Sedation:      Moderate (conscious) sedation was personally administered by the       endoscopist. The following parameters were monitored: oxygen saturation,       heart rate, blood pressure, respiratory rate, EKG, adequacy of pulmonary       ventilation, and response to care. Total physician intraservice time was       25 minutes. Recommendation:       -  Await biopsy and cytology results. Procedure Code(s):    --- Professional ---                       [phone], Bronchoscopy, rigid or flexible, including                        fluoroscopic guidance, when performed; with bronchial                        or endobronchial  biopsy(s), single or multiple sites                       31623, Bronchoscopy, rigid or flexible, including                        fluoroscopic guidance, when performed; with brushing or                        protected brushings                       99152, Moderate sedation services provided by the same                        physician or other qualified health care professional                        performing the diagnostic or therapeutic service that                        the sedation supports, requiring the presence of an                        independent trained observer to assist in the                        monitoring of the patient's level of consciousness and                        physiological status; initial 15 minutes of                        intraservice time, patient age 76 years or older                       [phone], Moderate sedation services; each additional 15                        minutes intraservice time Diagnosis Code(s):    --- Professional ---                       R91.8, Other nonspecific abnormal finding of lung field CPT copyright 2016 [employer]. All rights reserved. The codes documented in this report are preliminary and upon coder review may  be revised to meet current compliance requirements. Collene Gobble, MD Collene Gobble, MD 05/20/2015 12:04:11 PM Number of Addenda: 0

## 2015-05-20 NOTE — Telephone Encounter (Signed)
Darene Lamer, brother of pt, called to let Dr Raliegh Ip know pt is in the hospital.  Was admitted Saturday with severe lower abd pain. Fluid was also drained out of his lung, which they are also looking at. Hospital is also assessing pt's liver.   Pt 's number in the hospital is  [phone]

## 2015-05-21 ENCOUNTER — Other Ambulatory Visit: Payer: Self-pay | Admitting: Oncology

## 2015-05-21 ENCOUNTER — Telehealth: Payer: Self-pay | Admitting: *Deleted

## 2015-05-21 NOTE — Telephone Encounter (Signed)
Oncology Nurse Navigator Documentation  Oncology Nurse Navigator Flowsheets 05/21/2015  Navigator Location [facility]  Navigator Encounter Type Introductory phone call  Treatment Phase Pre-Tx/Tx Discussion  Barriers/Navigation Needs Coordination of Care  Interventions Coordination of Care  Coordination of Care Appts  Acuity Level 1  Acuity Level 1 Initial guidance, education and coordination as needed  Time Spent with Patient 15   I received referral from Dr. Sudie Bailey office. I called and spoke with patient.  He will be seen at [facility] on 05/29/15, arrive at 1:30.  He verbalized understanding of appt time and place.

## 2015-05-22 ENCOUNTER — Telehealth: Payer: Self-pay | Admitting: *Deleted

## 2015-05-22 ENCOUNTER — Telehealth: Payer: Self-pay | Admitting: Internal Medicine

## 2015-05-22 MED ORDER — OXYCODONE HCL 5 MG PO TABS: ORAL_TABLET | ORAL | Status: DC

## 2015-05-22 NOTE — Telephone Encounter (Signed)
Pt called back, I got message you need more pain medication. Pt said yes. Asked pt if taking 2 tablets every 4 hours? Pt said yes, was told to take 1-2 tablets every 4 hours. Asked pt where he is having pain? Pt said in abdomen. Told him Dr.K is not here will have to ask another provider for pain medication. I will have Rx ready by 3:00 pm to pickup here at the office. Pt verbalized understanding.

## 2015-05-22 NOTE — Telephone Encounter (Signed)
Discussed pt status with Dr.Burchette, order given for Oxycodone 1-2 tabs every 4-6 hours PRN for severe pain. Rx printed and signed by Dr. Elease Hashimoto.

## 2015-05-22 NOTE — Telephone Encounter (Signed)
Left message on voicemail to call office with a family member.

## 2015-05-22 NOTE — Telephone Encounter (Signed)
Pt was seen at ER and has an appt with Dr Earlie Server at [facility] on 05/29/15 at 1:30   Pt was given the following med  oxyCODONE (ROXICODONE) 5 MG immediate release tablet and call to say that he will not have enough pain pills to last him to his appt on 05/29/15     Pharmacy [facility] [address] [place]

## 2015-05-22 NOTE — Telephone Encounter (Signed)
FYI Brother called asking for "refill for oxycodone 5 mg takes two every 4 hours which he received with recent hospitalization.  Has about ten pills left and doesn't come in to see Dr. Julien Nordmann or Dr. Benay Spice until next week (05-29-2015).   Advised he call PCP.

## 2015-05-23 ENCOUNTER — Telehealth: Payer: Self-pay | Admitting: *Deleted

## 2015-05-23 ENCOUNTER — Encounter: Payer: Self-pay | Admitting: *Deleted

## 2015-05-23 DIAGNOSIS — C3491 Malignant neoplasm of unspecified part of right bronchus or lung: Secondary | ICD-10-CM

## 2015-05-23 LAB — CULTURE, BLOOD (ROUTINE X 2)
CULTURE: NO GROWTH
Culture: NO GROWTH

## 2015-05-23 NOTE — Telephone Encounter (Signed)
Transition Care Management Follow-up Telephone Call  How have you been since you were released from the hospital? About the same   Do you understand why you were in the hospital? yes   Do you understand the discharge instrcutions? yes  Items Reviewed:  Medications reviewed: yes  Allergies reviewed: yes  Dietary changes reviewed: yes  Referrals reviewed: yes   Functional Questionnaire:   Activities of Daily Living (ADLs):   He states they are independent in the following: ambulation, bathing and hygiene, feeding, continence, grooming, toileting and dressing States they require assistance with the following: none   Any transportation issues/concerns?: no.  Still has some pain, but a prescription was picked up today.   Any patient concerns? yes   Confirmed importance and date/time of follow-up visits scheduled: yes   Confirmed with patient if condition begins to worsen call PCP or go to the ER.  Patient was given the Call-a-Nurse line [phone]: yes Patient was discharged to home Patient was discharged 05/20/15 Patient has an appointment with Dr Raliegh Ip 06/03/15

## 2015-05-24 LAB — CULTURE, BODY FLUID-BOTTLE: CULTURE: NO GROWTH

## 2015-05-26 ENCOUNTER — Telehealth: Payer: Self-pay | Admitting: Internal Medicine

## 2015-05-26 ENCOUNTER — Telehealth: Payer: Self-pay | Admitting: Family Medicine

## 2015-05-26 MED ORDER — ONDANSETRON HCL 4 MG PO TABS: 4.0000 mg | ORAL_TABLET | Freq: Three times a day (TID) | ORAL | Status: DC | PRN

## 2015-05-26 NOTE — Telephone Encounter (Signed)
[facility] - Client [place] Patient Name: Shane Vasquez Gender: Male DOB: 1950-06-23 Age: 65 Y 78 M 30 D Return Phone Number: [phone] (Primary), [phone] (Secondary) Address: City/State/Zip: [place] Client [facility] - Client Client Site [facility] - Night Physician Simonne Martinet Contact Type Call Who Is Calling Patient / Member / Family / Caregiver Call Type Triage / Clinical Caller Name [name] Relationship To Patient Spouse Return Phone Number [phone] (Primary) Chief Complaint BREATHING - fast, heavy or wheezing Reason for Call Symptomatic / Request for [place] states her husband is on oxygen and his O2 level is 80 RPM 60. Translation No Nurse Assessment Nurse: Denyse Amass, RN, Benjamine Mola Date/Time (Eastern Time): 05/25/2015 12:03:19 AM Confirm and document reason for call. If symptomatic, describe symptoms. You must click the next button to save text entered. ---Patient's wife states the patient was admitted to the hospital on Sunday for Low Oxygen Level and they found a mass in his lung. They sent him home with Oxygen at 2 liters. Tonight his Oxygen fell off his nose and his oxygen level went down to 80. States she put the oxygen back on and his oxygen level is back up to 91% . Wife states the patient is not having any difficulty breathing and he has no symptoms at this time. states it just scared her when the oxygen level went down. Has the patient traveled out of the country within the last 30 days? ---Not Applicable Does the patient have any new or worsening symptoms? ---No Guidelines Guideline Title Affirmed Question Affirmed Notes Nurse Date/Time (Eastern Time) Disp. Time Eilene Ghazi Time) Disposition Final User 05/24/2015 11:51:58 PM Attempt made - no message left Lovelace, RN, Amy 05/25/2015 12:02:18 AM Send  To RN Personal Lovelace, RN, Amy 05/25/2015 12:08:09 AM Clinical Call Yes [place], RN, Benjamine Mola

## 2015-05-26 NOTE — Telephone Encounter (Signed)
Okay 4 mg dose #30

## 2015-05-26 NOTE — Telephone Encounter (Signed)
Spoke to [employer] pt's wife, told her Rx for Zofran sent to pharmacy. [name] verbalized understanding.

## 2015-05-26 NOTE — Telephone Encounter (Addendum)
Patients wife calling to see if the prescription is going to me called in to [facility] or if it is something that needs to be picked up from the office.  Please give her Macen Joslin) a call at [phone](w) or [phone](cell) to let her know which direction she needs to go in.

## 2015-05-26 NOTE — Telephone Encounter (Signed)
FYI

## 2015-05-26 NOTE — Telephone Encounter (Signed)
[facility] - Client [place] [facility] Patient Name: Shane Vasquez Gender: Male DOB: Oct 25, 1950 Age: 65 Y 51 M 30 D Return Phone Number: [phone] (Primary), [phone] (Secondary) Address: City/State/Zip: [place] Client [facility] - Client Client Site [facility] - Night Physician Simonne Martinet Contact Type Call Who Is Calling Patient / Member / Family / Caregiver Call Type Triage / Clinical Caller Name Abby Friend Relationship To Patient Spouse Return Phone Number [phone] (Primary) Chief Complaint Nausea Reason for Call Symptomatic / Request for [place] states her husband is feeling nauseous. Is taking pain medicine and he isn't feeling well from taking them PreDisposition Home Care Translation No Nurse Assessment Nurse: Mills-Hernandez, RN, Izora Gala Date/Time (Eastern Time): 05/25/2015 2:24:34 PM Confirm and document reason for call. If symptomatic, describe symptoms. You must click the next button to save text entered. ---Caller states her husband is on Oxycodone 5 mg for pain. The medication is making nauseated. Has the patient traveled out of the country within the last 30 days? ---No Does the patient have any new or worsening symptoms? ---Yes Will a triage be completed? ---Yes Related visit to physician within the last 2 weeks? ---No Does the PT have any chronic conditions? (i.e. diabetes, asthma, etc.) ---Yes List chronic conditions. ---HTN Is this a behavioral health or substance abuse call? ---No Nurse: Mills-Hernandez, RN, Izora Gala Date/Time (Eastern Time): 05/25/2015 2:33:44 PM Please select the assessment type ---Standing order Other current medications? ---Yes List current medications. ---Oxycodone 5 mg Medication allergies? ---No Pharmacy name and phone number. ---[facility] [phone] PLEASE NOTE: All  timestamps contained within this report are represented as [place] Standard Time. CONFIDENTIALTY NOTICE: This fax transmission is intended only for the addressee. It contains information that is legally privileged, confidential or otherwise protected from use or disclosure. If you are not the intended recipient, you are strictly prohibited from reviewing, disclosing, copying using or disseminating any of this information or taking any action in reliance on or regarding this information. If you have received this fax in error, please notify us immediately by telephone so that we can arrange for its return to [place]. Phone: [phone], Toll-Free: [phone], Fax: [phone] Page: 2 of 3 Call Id: [id number] Guidelines Guideline Title Affirmed Question Affirmed Notes Nurse Date/Time Eilene Ghazi Time) Nausea Taking prescription medication that could cause nausea (e.g., narcotics/opiates, antibiotics, OCPs, many others) Mills-Hernandez, RN, Izora Gala 05/25/2015 2:27:22 PM Disp. Time Eilene Ghazi Time) Disposition Final User 05/25/2015 2:37:38 PM Pharmacy Call [place], [place], Izora Gala Reason: [facility] [phone] 05/25/2015 2:46:08 PM Call PCP within 24 Hours Yes Mills-Hernandez, RN, Cindee Salt Understands: Yes Disagree/Comply: Comply Care Advice Given Per Guideline CALL PCP WITHIN 24 HOURS: You need to discuss this with your doctor within the next 24 hours. * IF OFFICE WILL BE OPEN: Call the office when it opens tomorrow morning. CLEAR FLUIDS - Take clear fluids in small amounts until the nausea is resolved for 8 hours: * Sip water or rehydration liquid (Gatorade or Powerade) * Other options: 1/2 strength flat lemon-lime soda or ginger ale SOLIDS: Gradually return to a normal diet. Start with saltine crackers, white bread, rice, mashed potatoes, cereal, apple sauce etc. AVOID MEDS: * Stop taking all non-prescription medicines. (Reason: may make nausea worse.) * Avoid NSAIDs, which can cause gastritis *  Call if vomiting a prescription medicine. CALL BACK IF: * You become worse. CARE ADVICE given per Nausea (Adult) guideline. Standing Orders Preparation Additional Instructions Route Frequency Duration  Nurse Comments User Name Zofran '4mg'$  (regular tablet or ODT) 1-2 tablets as needed, # 6 Oral Every 8 Hours Mills-Hernandez, RN, Izora Gala Referrals REFERRED TO PCP OFFICE

## 2015-05-26 NOTE — Telephone Encounter (Signed)
Called patient and discussed.  Does have follow-up scheduled here and also with oncology next week

## 2015-05-26 NOTE — Telephone Encounter (Signed)
Pt has an appt to see dr Inda Merlin on thurdsay 05-29-15. Pt would like to see new rx zofran ?mg. [facility] [address]

## 2015-05-26 NOTE — Telephone Encounter (Signed)
Pt would like a Rx for Zofran, okay to order? Please advise.

## 2015-05-26 NOTE — Telephone Encounter (Signed)
Done, Rx was sent to pharmacy today and pt's wife is aware.

## 2015-05-29 ENCOUNTER — Other Ambulatory Visit: Payer: Medicare Other

## 2015-05-29 ENCOUNTER — Encounter (HOSPITAL_COMMUNITY): Payer: Self-pay

## 2015-05-29 ENCOUNTER — Encounter: Payer: Medicare Other | Admitting: Thoracic Surgery (Cardiothoracic Vascular Surgery)

## 2015-05-29 ENCOUNTER — Encounter: Payer: Self-pay | Admitting: *Deleted

## 2015-05-29 ENCOUNTER — Ambulatory Visit: Payer: Medicare Other | Admitting: Physical Therapy

## 2015-05-29 ENCOUNTER — Inpatient Hospital Stay (HOSPITAL_COMMUNITY): Payer: Medicare Other

## 2015-05-29 ENCOUNTER — Ambulatory Visit (HOSPITAL_BASED_OUTPATIENT_CLINIC_OR_DEPARTMENT_OTHER): Payer: Medicare Other | Admitting: Internal Medicine

## 2015-05-29 ENCOUNTER — Encounter: Payer: Self-pay | Admitting: Internal Medicine

## 2015-05-29 ENCOUNTER — Inpatient Hospital Stay (HOSPITAL_COMMUNITY)
Admission: AD | Admit: 2015-05-29 | Discharge: 2015-06-04 | DRG: 640 | Disposition: A | Discharge status: Skilled Nursing Facility | Payer: Medicare Other | Source: Ambulatory Visit | Attending: Internal Medicine | Admitting: Internal Medicine

## 2015-05-29 VITALS — BP 96/82 | HR 90 | Temp 98.3°F | Resp 18 | Ht 69.0 in | Wt 158.4 lb

## 2015-05-29 DIAGNOSIS — D6959 Other secondary thrombocytopenia: Secondary | ICD-10-CM | POA: Diagnosis present

## 2015-05-29 DIAGNOSIS — R7989 Other specified abnormal findings of blood chemistry: Secondary | ICD-10-CM | POA: Diagnosis present

## 2015-05-29 DIAGNOSIS — E860 Dehydration: Secondary | ICD-10-CM | POA: Diagnosis present

## 2015-05-29 DIAGNOSIS — R066 Hiccough: Secondary | ICD-10-CM

## 2015-05-29 DIAGNOSIS — D72829 Elevated white blood cell count, unspecified: Secondary | ICD-10-CM | POA: Diagnosis present

## 2015-05-29 DIAGNOSIS — C342 Malignant neoplasm of middle lobe, bronchus or lung: Secondary | ICD-10-CM | POA: Diagnosis present

## 2015-05-29 DIAGNOSIS — J449 Chronic obstructive pulmonary disease, unspecified: Secondary | ICD-10-CM | POA: Diagnosis present

## 2015-05-29 DIAGNOSIS — C786 Secondary malignant neoplasm of retroperitoneum and peritoneum: Secondary | ICD-10-CM | POA: Diagnosis not present

## 2015-05-29 DIAGNOSIS — E8770 Fluid overload, unspecified: Secondary | ICD-10-CM | POA: Diagnosis not present

## 2015-05-29 DIAGNOSIS — Z66 Do not resuscitate: Secondary | ICD-10-CM | POA: Diagnosis present

## 2015-05-29 DIAGNOSIS — Z87891 Personal history of nicotine dependence: Secondary | ICD-10-CM

## 2015-05-29 DIAGNOSIS — E883 Tumor lysis syndrome: Secondary | ICD-10-CM | POA: Diagnosis present

## 2015-05-29 DIAGNOSIS — R748 Abnormal levels of other serum enzymes: Secondary | ICD-10-CM | POA: Diagnosis not present

## 2015-05-29 DIAGNOSIS — I10 Essential (primary) hypertension: Secondary | ICD-10-CM | POA: Diagnosis present

## 2015-05-29 DIAGNOSIS — C3431 Malignant neoplasm of lower lobe, right bronchus or lung: Secondary | ICD-10-CM | POA: Diagnosis not present

## 2015-05-29 DIAGNOSIS — Z6823 Body mass index (BMI) 23.0-23.9, adult: Secondary | ICD-10-CM | POA: Diagnosis not present

## 2015-05-29 DIAGNOSIS — K7689 Other specified diseases of liver: Secondary | ICD-10-CM

## 2015-05-29 DIAGNOSIS — E790 Hyperuricemia without signs of inflammatory arthritis and tophaceous disease: Secondary | ICD-10-CM

## 2015-05-29 DIAGNOSIS — D61818 Other pancytopenia: Secondary | ICD-10-CM | POA: Diagnosis present

## 2015-05-29 DIAGNOSIS — Z5111 Encounter for antineoplastic chemotherapy: Secondary | ICD-10-CM | POA: Diagnosis not present

## 2015-05-29 DIAGNOSIS — E43 Unspecified severe protein-calorie malnutrition: Secondary | ICD-10-CM | POA: Diagnosis present

## 2015-05-29 DIAGNOSIS — G893 Neoplasm related pain (acute) (chronic): Secondary | ICD-10-CM | POA: Diagnosis not present

## 2015-05-29 DIAGNOSIS — R945 Abnormal results of liver function studies: Secondary | ICD-10-CM

## 2015-05-29 DIAGNOSIS — I129 Hypertensive chronic kidney disease with stage 1 through stage 4 chronic kidney disease, or unspecified chronic kidney disease: Secondary | ICD-10-CM | POA: Diagnosis present

## 2015-05-29 DIAGNOSIS — N183 Chronic kidney disease, stage 3 (moderate): Secondary | ICD-10-CM | POA: Diagnosis present

## 2015-05-29 DIAGNOSIS — R591 Generalized enlarged lymph nodes: Secondary | ICD-10-CM

## 2015-05-29 DIAGNOSIS — K769 Liver disease, unspecified: Secondary | ICD-10-CM | POA: Diagnosis present

## 2015-05-29 DIAGNOSIS — N179 Acute kidney failure, unspecified: Secondary | ICD-10-CM | POA: Diagnosis present

## 2015-05-29 DIAGNOSIS — C799 Secondary malignant neoplasm of unspecified site: Secondary | ICD-10-CM

## 2015-05-29 DIAGNOSIS — C3491 Malignant neoplasm of unspecified part of right bronchus or lung: Secondary | ICD-10-CM

## 2015-05-29 DIAGNOSIS — E46 Unspecified protein-calorie malnutrition: Secondary | ICD-10-CM

## 2015-05-29 DIAGNOSIS — R0600 Dyspnea, unspecified: Secondary | ICD-10-CM | POA: Diagnosis not present

## 2015-05-29 DIAGNOSIS — C3490 Malignant neoplasm of unspecified part of unspecified bronchus or lung: Secondary | ICD-10-CM | POA: Diagnosis present

## 2015-05-29 DIAGNOSIS — D649 Anemia, unspecified: Secondary | ICD-10-CM | POA: Diagnosis present

## 2015-05-29 DIAGNOSIS — R627 Adult failure to thrive: Principal | ICD-10-CM

## 2015-05-29 DIAGNOSIS — J90 Pleural effusion, not elsewhere classified: Secondary | ICD-10-CM

## 2015-05-29 DIAGNOSIS — C787 Secondary malignant neoplasm of liver and intrahepatic bile duct: Secondary | ICD-10-CM | POA: Diagnosis present

## 2015-05-29 DIAGNOSIS — Z825 Family history of asthma and other chronic lower respiratory diseases: Secondary | ICD-10-CM

## 2015-05-29 DIAGNOSIS — F329 Major depressive disorder, single episode, unspecified: Secondary | ICD-10-CM | POA: Diagnosis present

## 2015-05-29 DIAGNOSIS — R530 Neoplastic (malignant) related fatigue: Secondary | ICD-10-CM | POA: Diagnosis not present

## 2015-05-29 LAB — CBC WITH DIFFERENTIAL/PLATELET
BASO%: 1.1 % (ref 0.0–2.0)
Basophils Absolute: 0.2 10*3/uL — ABNORMAL HIGH (ref 0.0–0.1)
EOS%: 0.5 % (ref 0.0–7.0)
Eosinophils Absolute: 0.1 10*3/uL (ref 0.0–0.5)
HEMATOCRIT: 32.5 % — AB (ref 38.4–49.9)
HGB: 11.3 g/dL — ABNORMAL LOW (ref 13.0–17.1)
LYMPH%: 12.8 % — AB (ref 14.0–49.0)
MCH: 31.7 pg (ref 27.2–33.4)
MCHC: 34.8 g/dL (ref 32.0–36.0)
MCV: 91.3 fL (ref 79.3–98.0)
MONO#: 1.5 10*3/uL — AB (ref 0.1–0.9)
MONO%: 9.2 % (ref 0.0–14.0)
NEUT#: 12.2 10*3/uL — ABNORMAL HIGH (ref 1.5–6.5)
NEUT%: 76.4 % — AB (ref 39.0–75.0)
Platelets: 78 10*3/uL — ABNORMAL LOW (ref 140–400)
RBC: 3.56 10*6/uL — ABNORMAL LOW (ref 4.20–5.82)
RDW: 15.6 % — ABNORMAL HIGH (ref 11.0–14.6)
WBC: 16 10*3/uL — ABNORMAL HIGH (ref 4.0–10.3)
lymph#: 2 10*3/uL (ref 0.9–3.3)
nRBC: 7 % — ABNORMAL HIGH (ref 0–0)

## 2015-05-29 LAB — COMPREHENSIVE METABOLIC PANEL
ALBUMIN: 2 g/dL — AB (ref 3.5–5.0)
ALK PHOS: 923 U/L — AB (ref 40–150)
ALT: 139 U/L — ABNORMAL HIGH (ref 0–55)
ANION GAP: 16 meq/L — AB (ref 3–11)
AST: 610 U/L — AB (ref 5–34)
BUN: 76.9 mg/dL — AB (ref 7.0–26.0)
CALCIUM: 10.2 mg/dL (ref 8.4–10.4)
CO2: 21 mEq/L — ABNORMAL LOW (ref 22–29)
Chloride: 97 mEq/L — ABNORMAL LOW (ref 98–109)
Creatinine: 1.9 mg/dL — ABNORMAL HIGH (ref 0.7–1.3)
EGFR: 36 mL/min/{1.73_m2} — ABNORMAL LOW (ref >90)
Glucose: 85 mg/dl (ref 70–140)
POTASSIUM: 4.8 meq/L (ref 3.5–5.1)
Sodium: 135 mEq/L — ABNORMAL LOW (ref 136–145)
Total Bilirubin: 3.73 mg/dL (ref 0.20–1.20)
Total Protein: 6.1 g/dL — ABNORMAL LOW (ref 6.4–8.3)

## 2015-05-29 LAB — TECHNOLOGIST REVIEW

## 2015-05-29 MED ORDER — POLYETHYLENE GLYCOL 3350 17 G PO PACK: 17.0000 g | PACK | Freq: Every day | ORAL | Status: DC | PRN

## 2015-05-29 MED ORDER — ACETAMINOPHEN 650 MG RE SUPP: 650.0000 mg | Freq: Four times a day (QID) | RECTAL | Status: DC | PRN

## 2015-05-29 MED ORDER — ONDANSETRON HCL 4 MG/2ML IJ SOLN: 4.0000 mg | Freq: Four times a day (QID) | INTRAMUSCULAR | Status: DC | PRN

## 2015-05-29 MED ORDER — CITALOPRAM HYDROBROMIDE 20 MG PO TABS
40.0000 mg | ORAL_TABLET | Freq: Every day | ORAL | Status: DC
  Administered 2015-05-30 – 2015-06-04 (×7): 40 mg via ORAL
  Filled 2015-05-29 (×8): qty 2

## 2015-05-29 MED ORDER — DOCUSATE SODIUM 100 MG PO CAPS
100.0000 mg | ORAL_CAPSULE | Freq: Every day | ORAL | Status: DC
  Administered 2015-05-30 – 2015-06-04 (×6): 100 mg via ORAL
  Filled 2015-05-29 (×7): qty 1

## 2015-05-29 MED ORDER — MORPHINE SULFATE (PF) 2 MG/ML IV SOLN
2.0000 mg | INTRAVENOUS | Status: DC | PRN
  Administered 2015-05-31 – 2015-06-01 (×2): 2 mg via INTRAVENOUS
  Filled 2015-05-29 (×2): qty 1

## 2015-05-29 MED ORDER — OXYCODONE HCL 5 MG PO TABS
5.0000 mg | ORAL_TABLET | ORAL | Status: DC | PRN
  Administered 2015-05-30 – 2015-05-31 (×2): 10 mg via ORAL
  Filled 2015-05-29: qty 2
  Filled 2015-05-29: qty 1
  Filled 2015-05-29: qty 2

## 2015-05-29 MED ORDER — SODIUM CHLORIDE 0.9 % IV SOLN
INTRAVENOUS | Status: DC
  Administered 2015-05-29 – 2015-06-02 (×6): via INTRAVENOUS

## 2015-05-29 MED ORDER — ONDANSETRON HCL 4 MG PO TABS: 4.0000 mg | ORAL_TABLET | Freq: Four times a day (QID) | ORAL | Status: DC | PRN

## 2015-05-29 MED ORDER — ACETAMINOPHEN 325 MG PO TABS: 650.0000 mg | ORAL_TABLET | Freq: Four times a day (QID) | ORAL | Status: DC | PRN

## 2015-05-29 MED ORDER — CHLORPROMAZINE HCL 25 MG/ML IJ SOLN
25.0000 mg | Freq: Three times a day (TID) | INTRAMUSCULAR | Status: DC | PRN
  Administered 2015-05-29 – 2015-06-03 (×3): 25 mg via INTRAMUSCULAR
  Filled 2015-05-29 (×5): qty 1

## 2015-05-29 MED ORDER — SENNA 8.6 MG PO TABS: 1.0000 | ORAL_TABLET | Freq: Every day | ORAL | Status: DC | PRN

## 2015-05-29 NOTE — Progress Notes (Signed)
[place] Clinical Social Work  Clinical Social Work met with patient/family and Futures trader at [facility] appointment to offer support and assess for psychosocial needs.  Medical oncologist reviewed patient's diagnosis and recommended treatment plan with patient/family.  Medical oncologist recommended to admit patient today and begin chemotherapy treatment tomorrow.  The patient agreed to begin chemotherapy treatment as soon as possible.  The patient was accompanied by his spouse and brother.  Patient and family reported feeling appreciative of oncologist being direct, open, and compassionate regarding patient's condition.  CSW brief emotional support and plans to follow up with patient/family.  Clinical Social Work briefly discussed Clinical Social Work role and [employer] support programs/services.  Clinical Social Work encouraged patient to call with any additional questions or concerns.   Polo Riley, MSW, LCSW, OSW-C Clinical Social Worker [facility] [phone]

## 2015-05-29 NOTE — Progress Notes (Signed)
[place] Telephone:([phone]) [phone]   [phone]) [phone] Multidisciplinary thoracic oncology clinic  CONSULT NOTE  REFERRING PHYSICIAN: Dr. Baltazar Apo  REASON FOR CONSULTATION:  44 white male recently diagnosed with lung cancer.  HPI Shane Vasquez is a 65 y.o. male with past medical history significant for hypertension, dyslipidemia, GERD, diverticulosis, anemia as well as long history of smoking but quit 5 years ago. The patient mentions that 4 weeks ago he start complaining of abdominal pain as well as nausea and questionable constipation. He contacted his primary care physician's office for evaluation and he was asked to use MiraLAX for constipation. His symptoms were getting worse and the patient presented to the emergency department on 05/18/2015. Abdominal x-ray showed no evidence of bowel obstruction or ileus. He was complaining of shortness of breath and chest x-ray was performed on 05/18/2015 and it showed new intense ill-defined airspace opacity within the right lower lobe extending to the right hilum. Additionally there was a vague density at the right hilum and within the overlying medial suprahilar region. CT scan of the chest, abdomen and pelvis without contrast was performed on 05/18/2015 and it showed an ill-defined 7.0 x 9.0 cm mass within the central right middle and lower lobes extending to the right hilum. There was multiple large mediastinal and right hilar lymph nodes including 3.8 x 3.8 cm right peritracheal node, 3.4 x 4.6 cm subcarinal node and 1.7 cm right hilar node. There was also small right pleural effusion with pleural nodularity. These findings were compatible with pulmonary malignancy with pleural and lymph node metastasis. CT of the abdomen showed multiple faint hypodense lesions within the liver the largest measured 1.5 cm suspicious for metastatic disease. There was also enlarged retroperitoneal and upper abdominal lymph nodes identified  including 1.8 x 2.1 cm right retroperitoneal lymph node. Ultrasound guided right thoracentesis was performed by interventional radiology with drainage of 8:30 ML of his the dark yellow fluid and the final cytology was consistent with a small cell carcinoma. The patient was also seen by Dr. Lamonte Sakai during his admission and on 05/20/2015 he underwent bronchoscopy with biopsies of the right middle lobe mass.  The final pathology (Accession: [id number]) was consistent with a small cell carcinoma of the lung. Dr. Lamonte Sakai kindly referred the patient to the multidisciplinary thoracic oncology clinic today for further evaluation and recommendation regarding treatment of his condition. When seen today the patient feels very bad with significant weakness and fatigue as well as poor by mouth intake and weight loss. He also has some hallucination in recent falls. He developed hiccups over the last 4 days. She is improved. He also complained of shortness breath at baseline and increased with exertion but no significant chest pain or hemoptysis. He lost around 15 pounds in the last few weeks. He denied having any headache or visual changes. He has nausea and currently on Zofran. He mentioned a history of treatment for empyema 1 years ago. Family history significant for father with COPD, mother had osteoporosis and sister had leukemia. The patient is married and has no children. He was accompanied today by his wife, Shane Vasquez and his brother, Shane Vasquez. He used to [employer] office for [employer]. He has a history of smoking more than one pack per day for around 30 years but quit 5 years ago. No history of alcohol or drug abuse.  HPI  Past Medical History  Diagnosis Date  . CERVICAL RADICULOPATHY, RIGHT 08/12/2009  . DIVERTICULOSIS, COLON 08/15/2006  . GERD 08/15/2006  .  HYPERLIPIDEMIA 08/15/2006  . HYPERTENSION 08/15/2006  . INTERNAL HEMORRHOIDS 02/29/2008  . SKIN CANCER, HX OF 08/15/2006  . CAP (community acquired pneumonia)     LLL  07/2014  . 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 Tonsillectomy    . Video bronchoscopy N/A 07/27/2014    Procedure: VIDEO BRONCHOSCOPY;  Surgeon: [name], MD;  Location: [facility] OR;  Service: Thoracic;  Laterality: N/A;  . Video assisted thoracoscopy (vats)/empyema Left 07/27/2014    Procedure: VIDEO ASSISTED THORACOSCOPY (VATS)/EMPYEMA Left, drainage pleural effusion.;  Surgeon: [name], MD;  Location: [place];  Service: Thoracic;  Laterality: Left;  . Video bronchoscopy Bilateral 05/20/2015    Procedure: VIDEO BRONCHOSCOPY WITH FLUORO;  Surgeon: Collene Gobble, MD;  Location: [place];  Service: Cardiopulmonary;  Laterality: Bilateral;    Family History  Problem Relation Age of Onset  . Osteoporosis Mother   . COPD Father     Social History Social History  Substance Use Topics  . Smoking status: Former Smoker -- 1.00 packs/day    Types: Cigarettes    Quit date: 08/29/2012  . Smokeless tobacco: Never Used  . Alcohol Use: No    Allergies  Allergen Reactions  . Bee Venom Anaphylaxis and Nausea And Vomiting    Current Outpatient Prescriptions  Medication Sig Dispense Refill  . citalopram (CELEXA) 40 MG tablet TAKE 1 TABLET BY MOUTH DAILY 90 tablet 1  . docusate sodium (COLACE) 100 MG capsule Take 1 capsule (100 mg total) by mouth daily. 10 capsule 0  . EPINEPHrine 0.3 mg/0.3 mL IJ SOAJ injection Inject 0.3 mLs (0.3 mg total) into the muscle once. 1 Device 2  . hydrochlorothiazide (HYDRODIURIL) 25 MG tablet Take 25 mg by mouth daily.  1  . ondansetron (ZOFRAN) 4 MG tablet Take 1 tablet (4 mg total) by mouth every 8 (eight) hours as needed for nausea or vomiting. 30 tablet 0  . oxyCODONE (ROXICODONE) 5 MG immediate release tablet TAKE 1-2 TABLETS BY MOUTH EVERY 4-6 HOURS AS NEEDED FOR SEVERE PAIN 60 tablet 0  . polyethylene glycol (MIRALAX / GLYCOLAX) packet Take 17 g by mouth daily as needed for mild  constipation.    . senna (SENOKOT) 8.6 MG TABS tablet Take 1 tablet by mouth daily as needed for mild constipation.     No current facility-administered medications for this visit.    Review of Systems  Constitutional: positive for anorexia, fatigue and weight loss Eyes: positive for icterus Ears, nose, mouth, throat, and face: negative Respiratory: positive for cough and dyspnea on exertion Cardiovascular: negative Gastrointestinal: positive for abdominal pain and nausea Genitourinary:negative Integument/breast: negative Hematologic/lymphatic: negative Musculoskeletal:positive for muscle weakness Neurological: positive for weakness Behavioral/Psych: negative Endocrine: negative Allergic/Immunologic: negative  Physical Exam  NKN:LZJQB, healthy, cachectic, ill looking, malnourished and pale SKIN: skin color, texture, turgor are normal, no rashes or significant lesions HEAD: Normocephalic, No masses, lesions, tenderness or abnormalities EYES: normal, PERRLA, Conjunctiva are pink and non-injected EARS: External ears normal, Canals clear OROPHARYNX:no exudate, no erythema and lips, buccal mucosa, and tongue normal  NECK: supple, no adenopathy, no JVD LYMPH:  no palpable lymphadenopathy, no hepatosplenomegaly LUNGS: decreased breath sounds, expiratory wheezes bilaterally HEART: regular rate & rhythm, no murmurs and no gallops ABDOMEN:abdomen soft, non-tender, normal bowel sounds and no masses or organomegaly BACK: Back symmetric, no curvature., No CVA tenderness EXTREMITIES:no joint deformities, effusion, or inflammation, no edema  NEURO: alert &  oriented x 3 with fluent speech, no focal motor/sensory deficits  PERFORMANCE STATUS: ECOG 2  LABORATORY DATA: Lab Results  Component Value Date   WBC 16.0* 05/29/2015   HGB 11.3* 05/29/2015   HCT 32.5* 05/29/2015   MCV 91.3 05/29/2015   PLT 78 Large platelets present* 05/29/2015      Chemistry      Component Value Date/Time     NA 135* 05/29/2015 1313   NA 138 05/20/2015 0321   K 4.8 05/29/2015 1313   K 3.8 05/20/2015 0321   CL 106 05/20/2015 0321   CO2 21* 05/29/2015 1313   CO2 20* 05/20/2015 0321   BUN 76.9* 05/29/2015 1313   BUN 27* 05/20/2015 0321   CREATININE 1.9* 05/29/2015 1313   CREATININE 1.47* 05/20/2015 0321      Component Value Date/Time   CALCIUM 10.2 05/29/2015 1313   CALCIUM 8.2* 05/20/2015 0321   ALKPHOS 923* 05/29/2015 1313   ALKPHOS 482* 05/18/2015 0916   AST 610* 05/29/2015 1313   AST 336* 05/18/2015 0916   ALT 139* 05/29/2015 1313   ALT 276* 05/18/2015 0916   BILITOT 3.73* 05/29/2015 1313   BILITOT 0.9 05/18/2015 0916       RADIOGRAPHIC STUDIES: Ct Abdomen Pelvis Wo Contrast  05/18/2015  CLINICAL DATA:  64 year old male with abdominal pelvic pain, nausea and right lower lung opacity identified on recent chest radiograph. EXAM: CT CHEST, ABDOMEN AND PELVIS WITHOUT CONTRAST TECHNIQUE: Multidetector CT imaging of the chest, abdomen and pelvis was performed following the standard protocol without IV contrast. COMPARISON:  05/18/2015 and prior radiographs.  07/26/2014 chest CT. FINDINGS: CT CHEST Mediastinum/Nodes: Multiple enlarged mediastinal and right hilar lymph nodes are identified. Index nodes include the following: A 3.8 x 3.8 cm right peritracheal node (image 25). A 3.4 x 4.6 cm sub carinal node (image 33). A 1.7 cm right hilar node (image 32). There is no evidence of pericardial effusion. Ectasia of the ascending aorta measuring 3.5 cm noted. Lungs/Pleura: An ill-defined 7 x 9 cm mass within the central right middle/ lower lobes identified extending to the right hilum. Nodular interstitial thickening within the right lower lobe identified. Ill-defined ground-glass and nodular opacities within the right upper lobe are identified. A small right pleural effusion is noted with pleural nodularity. These findings are compatible with pulmonary malignancy with pleural and lymph node  metastases. There is no evidence of left pleural effusion. Emphysema is identified. Musculoskeletal: No acute or suspicious abnormalities identified. CT ABDOMEN AND PELVIS Please note that parenchymal abnormalities may be missed without intravenous contrast. Hepatobiliary: Multiple faint hypodense lesions within the liver are noted. The largest lesion measures 1.5 cm and anterior liver (image 57). The gallbladder is unremarkable. There is no evidence of biliary dilatation. Pancreas: Unremarkable Spleen: Unremarkable Adrenals/Urinary Tract: Mild bilateral renal atrophy noted. No definite mass, hydronephrosis urinary calculi noted. The bladder and adrenal glands are unremarkable. Stomach/Bowel: Colonic diverticulosis noted without diverticulitis. No evidence bowel obstruction or definite focal bowel wall thickening. Vascular/Lymphatic: Enlarged retroperitoneal and upper abdominal lymph nodes identified. An index 1.8 x 2.1 cm right retroperitoneal node (image 68) noted. Aortic atherosclerotic calcifications noted without aneurysm. Reproductive: Mild prostate enlargement noted. Other: No free fluid, pneumoperitoneum or focal collection. Musculoskeletal: No acute or suspicious abnormalities identified. IMPRESSION: 7 x 9 cm central right middle lobe/lower lobe mass compatible with malignancy. Right lower lobe nodular interstitial thickening,, right upper lobe nodules, right pleural nodules with small right pleural effusion, enlarged mediastinal, right hilar and upper abdominal lymph nodes, and ill-defined  hepatic lesions- compatible with metastatic disease. Electronically Signed   By: Margarette Canada M.D.   On: 05/18/2015 14:51   Dg Chest 1 View  05/19/2015  CLINICAL DATA:  Status post right-sided thoracentesis EXAM: CHEST 1 VIEW COMPARISON:  May 18, 2015 chest CT and chest radiograph FINDINGS: No pneumothorax. There has been near complete resolution of pleural effusion on the right. There is persistent right middle mass  with adjacent lobe airspace consolidation. There is scarring and atelectatic change in the left base. The heart size and pulmonary vascularity are within normal limits. Multiple foci of adenopathy are noted throughout the mediastinum superiorly and on the right. IMPRESSION: No pneumothorax. Essentially complete resolution of pleural effusion on the right. Mass with airspace consolidation remains on the right, unchanged. Extensive adenopathy present. Scarring and atelectatic change are noted in the left base. Electronically Signed   By: Lowella Grip III M.D.   On: 05/19/2015 14:03   Dg Chest 2 View  05/18/2015  CLINICAL DATA:  Shortness of breath and dry cough for 1-1/2 weeks. Former smoker for 50 years. EXAM: CHEST  2 VIEW COMPARISON:  Chest x-rays dated 01/02/2015 and 09/19/2014. FINDINGS: New ill-defined airspace opacity within the anterior aspect of the right lower lobe, extending to the right hilum. Chronic scarring/ atelectasis noted at the left lung base. Lungs are hyperexpanded indicating some degree of COPD. Coarse interstitial markings are seen within both lungs which is likely a related chronic interstitial lung disease. Heart size is upper normal. IMPRESSION: New dense ill-defined airspace opacity within the right lower lobe, extending to the right hilum. Additional vague density at the right hilum and within the overlying medial suprahilar region. This could represent pneumonia or airspace collapse related to a central obstructing mass. Recommend chest CT with contrast for further characterization. Hyperexpanded lungs indicating COPD/emphysema. Suspect associated chronic interstitial lung disease bilaterally. Electronically Signed   By: Franki Cabot M.D.   On: 05/18/2015 12:37   Dg Abd 1 View  05/18/2015  CLINICAL DATA:  Lower abdominal pain, constipation. EXAM: ABDOMEN - 1 VIEW COMPARISON:  None. FINDINGS: The bowel gas pattern is normal. Phleboliths are noted in the pelvis. Inferior tip of  liver extends iliac wing suggesting hepatomegaly. IMPRESSION: No evidence of bowel obstruction or ileus.  Possible hepatomegaly. Electronically Signed   By: Marijo Conception, M.D.   On: 05/18/2015 11:20   Ct Chest Wo Contrast  05/18/2015  CLINICAL DATA:  65 year old male with abdominal pelvic pain, nausea and right lower lung opacity identified on recent chest radiograph. EXAM: CT CHEST, ABDOMEN AND PELVIS WITHOUT CONTRAST TECHNIQUE: Multidetector CT imaging of the chest, abdomen and pelvis was performed following the standard protocol without IV contrast. COMPARISON:  05/18/2015 and prior radiographs.  07/26/2014 chest CT. FINDINGS: CT CHEST Mediastinum/Nodes: Multiple enlarged mediastinal and right hilar lymph nodes are identified. Index nodes include the following: A 3.8 x 3.8 cm right peritracheal node (image 25). A 3.4 x 4.6 cm sub carinal node (image 33). A 1.7 cm right hilar node (image 32). There is no evidence of pericardial effusion. Ectasia of the ascending aorta measuring 3.5 cm noted. Lungs/Pleura: An ill-defined 7 x 9 cm mass within the central right middle/ lower lobes identified extending to the right hilum. Nodular interstitial thickening within the right lower lobe identified. Ill-defined ground-glass and nodular opacities within the right upper lobe are identified. A small right pleural effusion is noted with pleural nodularity. 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  There is no evidence of left pleural effusion. Emphysema is identified. Musculoskeletal: No acute or suspicious abnormalities identified. CT ABDOMEN AND PELVIS Please note that parenchymal abnormalities may be missed without intravenous contrast. Hepatobiliary: Multiple faint hypodense lesions within the liver are noted. The largest lesion measures 1.5 cm and anterior liver (image 57). The gallbladder is unremarkable. There is no evidence of biliary dilatation. Pancreas:  Unremarkable Spleen: Unremarkable Adrenals/Urinary Tract: Mild bilateral renal atrophy noted. No definite mass, hydronephrosis urinary calculi noted. The bladder and adrenal glands are unremarkable. Stomach/Bowel: Colonic diverticulosis noted without diverticulitis. No evidence bowel obstruction or definite focal bowel wall thickening. Vascular/Lymphatic: Enlarged retroperitoneal and upper abdominal lymph nodes identified. An index 1.8 x 2.1 cm right retroperitoneal node (image 68) noted. Aortic atherosclerotic calcifications noted without aneurysm. Reproductive: Mild prostate enlargement noted. Other: No free fluid, pneumoperitoneum or focal collection. Musculoskeletal: No acute or suspicious abnormalities identified. IMPRESSION: 7 x 9 cm central right middle lobe/lower lobe mass compatible with malignancy. Right lower lobe nodular interstitial thickening,, right upper lobe nodules, right pleural nodules with small right pleural effusion, enlarged mediastinal, right hilar and upper abdominal lymph nodes, and ill-defined hepatic lesions- compatible with metastatic disease. Electronically Signed   By: Margarette Canada M.D.   On: 05/18/2015 14:51   US Thoracentesis Asp Pleural Space W/img Guide  05/19/2015  INDICATION: Newly diagnosed right lung mass with associated pleural effusion. Request diagnostic and therapeutic thoracentesis EXAM: ULTRASOUND GUIDED RIGHT THORACENTESIS MEDICATIONS: None. COMPLICATIONS: None immediate. PROCEDURE: An ultrasound guided thoracentesis was thoroughly discussed with the patient and questions answered. The benefits, risks, alternatives and complications were also discussed. The patient understands and wishes to proceed with the procedure. Written consent was obtained. Ultrasound was performed to localize and mark an adequate pocket of fluid in the right chest. The area was then prepped and draped in the normal sterile fashion. 1% Lidocaine was used for local anesthesia. Under ultrasound  guidance a Safe-T-Centesis catheter was introduced. Thoracentesis was performed. The catheter was removed and a dressing applied. FINDINGS: A total of approximately 830 mL of hazy, dark yellow fluid was removed. Samples were sent to the laboratory as requested by the clinical team. Limited ultrasound imaging of the liver during this procedure finds numerous lesions suspicious for metastatic disease. IMPRESSION: Successful ultrasound guided right thoracentesis yielding 830 mL of pleural fluid. Read by: Ascencion Dike PA-C Electronically Signed   By: Lucrezia Europe M.D.   On: 05/19/2015 14:26    ASSESSMENT: This is a very pleasant 65 years old white male recently diagnosed with extensive stage (T3, N3, M1b) small cell lung cancer presented with large right middle and lower lobe mass in addition to extensive mediastinal lymphadenopathy, right pleural effusion as well as extensive liver metastasis and retroperitoneal lymphadenopathy diagnosed in April 2017.   PLAN: I had a lengthy discussion with the patient and his family about his current disease stage, prognosis and treatment options. Unfortunately the patient has very poor prognosis and his liver enzymes are getting worse over the last 2 weeks. I gave him the option of palliative care and hospice referral versus consideration of palliative systemic chemotherapy with reduced dose carboplatin and etoposide during the first cycle of his treatment. The patient and his family are interested in proceeding with systemic therapy. I recommended for the patient to be admitted to [facility] to start his treatment as we may miss our chance for his treatment if we started his treatment next week because of the worsening liver and kidney function.  I discussed with the patient adverse effect of this treatment including but not limited to alopecia, myelosuppression, nausea and vomiting, peripheral neuropathy, liver or renal dysfunction. The patient and his family  understand the risk of receiving treatment under the current condition but there would like to take their chances. I will arrange for the patient to have MRI of the brain to complete the staging workup and to rule out brain metastasis. For the hiccup, I would consider the patient for treatment with Thorazine 25 mg by mouth every 8 hours as needed. For the lack of appetite and poor nutrition as well as dehydration, will arrange for the patient to start IV hydration during his hospitalization. The patient will be admitted under the hospitalist service and I appreciate the help from Dr. Waldron Labs. I will arrange for the patient follow-up visit after his discharge from the hospital for routine evaluation and repeat blood work and management any adverse effect of his treatment. The patient voices understanding of current disease status and treatment options and is in agreement with the current care plan.  All questions were answered. The patient knows to call the clinic with any problems, questions or concerns. We can certainly see the patient much sooner if necessary.  Thank you so much for allowing me to participate in the care of [place]. I will continue to follow up the patient with you and assist in his care.  I spent 55 minutes counseling the patient face to face. The total time spent in the appointment was 80 minutes.  Disclaimer: This note was dictated with voice recognition software. Similar sounding words can inadvertently be transcribed and may not be corrected upon review.   Jaiana Sheffer K. May 29, 2015, 3:00 PM

## 2015-05-29 NOTE — H&P (Signed)
[facility] History and Physical  Shane Vasquez JJO:[id number] DOB: December 13, 1950 DOA: 05/29/2015  Referring physician: Dr. Julien Nordmann, oncology  PCP: [name], MD  Specialists:   Chief Complaint: Poor oral intake, dehydration  HPI: Shane Vasquez is a 65 y.o. male  With a history of COPD, hypertension recently diagnosed small cell lung cancer with metastasis, that presented to Dr. Worthy Flank office (oncology) today and was found to have fatigue as well as weakness with poor oral intake and weight loss. Patient has also complained of having the hiccups over the last several days. He also complains of back pain as well as abdominal pain and nausea. Patient denies any headache, visual changes, dizziness. Patient does have a history of smoking but quit approximate 5 years ago. At this time, patient states he is very sleepy. He denies any chest pain, shortness of breath, problems urinating. Patient directly admitted from Dr. Worthy Flank office for palliative chemotherapy.  In the office, labs were drawn, patient found to have leukocytosis, thrombocytopenia.   Review of Systems:  Constitutional: Denies fever, chills, diaphoresis, appetite change and fatigue.  HEENT: Denies photophobia, eye pain, redness, hearing loss, ear pain, congestion, sore throat, rhinorrhea, sneezing, mouth sores, trouble swallowing, neck pain, neck stiffness and tinnitus.   Respiratory: Denies SOB, DOE, cough, chest tightness,  and wheezing.   Cardiovascular: Denies chest pain, palpitations and leg swelling.  Gastrointestinal: Complains of abdominal pain Genitourinary: Denies dysuria, urgency, frequency, hematuria, flank pain and difficulty urinating.  Musculoskeletal: Complains of back pain Skin: Denies pallor, rash and wound.  Neurological: Denies dizziness, seizures, syncope, weakness, light-headedness, numbness and headaches.  Hematological: Denies adenopathy. Easy bruising, personal or family bleeding  history  Psychiatric/Behavioral: Denies suicidal ideation, mood changes, confusion, nervousness, sleep disturbance and agitation  Past Medical History  Diagnosis Date  . CERVICAL RADICULOPATHY, RIGHT 08/12/2009  . DIVERTICULOSIS, COLON 08/15/2006  . GERD 08/15/2006  . HYPERLIPIDEMIA 08/15/2006  . HYPERTENSION 08/15/2006  . INTERNAL HEMORRHOIDS 02/29/2008  . SKIN CANCER, HX OF 08/15/2006  . CAP (community acquired pneumonia)     LLL 07/2014  . Empyema lung ([place])     2016 on left   Past Surgical History  Procedure Laterality Date  . Tonsillectomy    . Video bronchoscopy N/A 07/27/2014    Procedure: VIDEO BRONCHOSCOPY;  Surgeon: [name], MD;  Location: [facility] OR;  Service: Thoracic;  Laterality: N/A;  . Video assisted thoracoscopy (vats)/empyema Left 07/27/2014    Procedure: VIDEO ASSISTED THORACOSCOPY (VATS)/EMPYEMA Left, drainage pleural effusion.;  Surgeon: [name], MD;  Location: [place];  Service: Thoracic;  Laterality: Left;  . Video bronchoscopy Bilateral 05/20/2015    Procedure: VIDEO BRONCHOSCOPY WITH FLUORO;  Surgeon: Collene Gobble, MD;  Location: [place];  Service: Cardiopulmonary;  Laterality: Bilateral;   Social History:  reports that he quit smoking about 2 years ago. His smoking use included Cigarettes. He smoked 1.00 pack per day. He has never used smokeless tobacco. He reports that he does not drink alcohol or use illicit drugs.   Allergies  Allergen Reactions  . Bee Venom Anaphylaxis and Nausea And Vomiting    Family History  Problem Relation Age of Onset  . Osteoporosis Mother   . COPD Father     Prior to Admission medications   Medication Sig Start Date End Date Taking? Authorizing Provider  citalopram (CELEXA) 40 MG tablet TAKE 1 TABLET BY MOUTH DAILY 05/09/15   Marletta Lor, MD  docusate sodium (COLACE) 100 MG capsule Take 1  capsule (100 mg total) by mouth daily. 05/20/15   Kelvin Cellar, MD  EPINEPHrine 0.3 mg/0.3 mL IJ SOAJ injection  Inject 0.3 mLs (0.3 mg total) into the muscle once. 08/08/14   Marletta Lor, MD  hydrochlorothiazide (HYDRODIURIL) 25 MG tablet Take 25 mg by mouth daily. 05/10/15   Historical Provider, MD  ondansetron (ZOFRAN) 4 MG tablet Take 1 tablet (4 mg total) by mouth every 8 (eight) hours as needed for nausea or vomiting. 05/26/15   Marletta Lor, MD  oxyCODONE (ROXICODONE) 5 MG immediate release tablet TAKE 1-2 TABLETS BY MOUTH EVERY 4-6 HOURS AS NEEDED FOR SEVERE PAIN 05/22/15   Eulas Post, MD  polyethylene glycol (MIRALAX / GLYCOLAX) packet Take 17 g by mouth daily as needed for mild constipation.    Historical Provider, MD  senna (SENOKOT) 8.6 MG TABS tablet Take 1 tablet by mouth daily as needed for mild constipation.    Historical Provider, MD   Physical Exam: Filed Vitals:   05/29/15 1626  BP: 97/58  Pulse: 92  Temp: 97.3 F (36.3 C)  Resp: 18     General: Well developed, thin, NAD, appears stated age  HEENT: NCAT, PERRLA, EOMI, Anicteic Sclera, mucous membranes dry  Neck: Supple, no JVD, no masses  Cardiovascular: S1 S2 auscultated, no rubs, murmurs or gallops. Regular rate and rhythm.  Respiratory: Clear to auscultation bilaterally   Abdomen: Soft, nontender, nondistended, + bowel sounds  Extremities: warm dry without cyanosis clubbing or edema  Neuro: AAOx3, cranial nerves grossly intact. Strength equal and bilateral in upper/lower ext  Skin: Without rashes exudates or nodules  Psych: Normal affect and demeanor  Labs on Admission:  Basic Metabolic Panel:  Recent Labs Lab 05/29/15 1313  NA 135*  K 4.8  CO2 21*  GLUCOSE 85  BUN 76.9*  CREATININE 1.9*  CALCIUM 10.2   Liver Function Tests:  Recent Labs Lab 05/29/15 1313  AST 610*  ALT 139*  ALKPHOS 923*  BILITOT 3.73*  PROT 6.1*  ALBUMIN 2.0*   No results for input(s): LIPASE, AMYLASE in the last 168 hours. No results for input(s): AMMONIA in the last 168 hours. CBC:  Recent Labs Lab  05/29/15 1313  WBC 16.0*  NEUTROABS 12.2*  HGB 11.3*  HCT 32.5*  MCV 91.3  PLT 78 Large platelets present*   Cardiac Enzymes: No results for input(s): CKTOTAL, CKMB, CKMBINDEX, TROPONINI in the last 168 hours.  BNP (last 3 results) No results for input(s): BNP in the last 8760 hours.  ProBNP (last 3 results) No results for input(s): PROBNP in the last 8760 hours.  CBG: No results for input(s): GLUCAP in the last 168 hours.  Radiological Exams on Admission: No results found.  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 Plan to start palliative chemotherapy -Will order MRI brain without contrast  Acute kidney injury vs Chronic kidney disease, stage III -Secondary dehydration and poor oral intake -Creatinine was normal in 2016, however, earlier April 2017, creatinine 1.6 -Upon admission, Creatinine 1.9 -Hold HCTZ -Will place on IV fluids and continue to monitor BMP  Abdominal pain with nausea/ elevated liver enzymes -Continue pain control and antiemetics as needed -LFTs elevated, likely secondary to liver metastasis -Continue to monitor CMP  Thrombocytopenia -Likely secondary to the above -Currently no evidence of bleeding, continue to monitor CBC  Leukocytosis -Possibly reactive vs infectious -No complaints of SOB or cough, or problems urinating. -  Will continue to  Monitor -Obtain CXR and UA  Malnutrition/FTT -Will consult nutrition -Will place on IVF  Hiccups -Will order Thorazine 25 mg every 8 hours when necessary  Essential hypertension -Pressure has been soft. Will continue to monitor closely -Hold HCTZ  Depression -Continue Celexa  DVT prophylaxis: SCDs  Code Status: DNR  Condition: Guarded   Family Communication: None at bedside. Admission, patients condition and plan of care including tests being ordered have been discussed with the  patient, who indicates understanding and agrees with the plan and Code Status.  Disposition Plan: Admitted  Time spent: 70 minutes  Presli Fanguy D.O. [facility] Pager [phone]  If 7PM-7AM, please contact night-coverage [email or web address] Password [email or web address] 05/29/2015, 5:09 PM

## 2015-05-29 NOTE — Progress Notes (Signed)
Shane Vasquez, from MRI called stating that the pt would not be able to get the MRI done tonight. MRI scanner will be down until 12pm on 4/14.

## 2015-05-30 DIAGNOSIS — R112 Nausea with vomiting, unspecified: Secondary | ICD-10-CM

## 2015-05-30 DIAGNOSIS — Z5111 Encounter for antineoplastic chemotherapy: Secondary | ICD-10-CM

## 2015-05-30 DIAGNOSIS — R066 Hiccough: Secondary | ICD-10-CM

## 2015-05-30 DIAGNOSIS — R530 Neoplastic (malignant) related fatigue: Secondary | ICD-10-CM

## 2015-05-30 DIAGNOSIS — C799 Secondary malignant neoplasm of unspecified site: Secondary | ICD-10-CM | POA: Insufficient documentation

## 2015-05-30 DIAGNOSIS — E46 Unspecified protein-calorie malnutrition: Secondary | ICD-10-CM

## 2015-05-30 DIAGNOSIS — C3490 Malignant neoplasm of unspecified part of unspecified bronchus or lung: Secondary | ICD-10-CM

## 2015-05-30 DIAGNOSIS — E860 Dehydration: Secondary | ICD-10-CM

## 2015-05-30 DIAGNOSIS — G893 Neoplasm related pain (acute) (chronic): Secondary | ICD-10-CM

## 2015-05-30 LAB — CBC
HEMATOCRIT: 28.3 % — AB (ref 39.0–52.0)
Hemoglobin: 9.9 g/dL — ABNORMAL LOW (ref 13.0–17.0)
MCH: 31.7 pg (ref 26.0–34.0)
MCHC: 35 g/dL (ref 30.0–36.0)
MCV: 90.7 fL (ref 78.0–100.0)
PLATELETS: 72 10*3/uL — AB (ref 150–400)
RBC: 3.12 MIL/uL — ABNORMAL LOW (ref 4.22–5.81)
RDW: 15.7 % — ABNORMAL HIGH (ref 11.5–15.5)
WBC: 15.6 10*3/uL — ABNORMAL HIGH (ref 4.0–10.5)

## 2015-05-30 LAB — COMPREHENSIVE METABOLIC PANEL
ALBUMIN: 2.1 g/dL — AB (ref 3.5–5.0)
ALK PHOS: 736 U/L — AB (ref 38–126)
ALT: 120 U/L — AB (ref 17–63)
AST: 526 U/L — AB (ref 15–41)
Anion gap: 17 — ABNORMAL HIGH (ref 5–15)
BILIRUBIN TOTAL: 4 mg/dL — AB (ref 0.3–1.2)
BUN: 83 mg/dL — AB (ref 6–20)
CALCIUM: 9.5 mg/dL (ref 8.9–10.3)
CO2: 20 mmol/L — ABNORMAL LOW (ref 22–32)
CREATININE: 1.8 mg/dL — AB (ref 0.61–1.24)
Chloride: 99 mmol/L — ABNORMAL LOW (ref 101–111)
GFR calc Af Amer: 44 mL/min — ABNORMAL LOW (ref >60)
GFR calc non Af Amer: 38 mL/min — ABNORMAL LOW (ref >60)
GLUCOSE: 100 mg/dL — AB (ref 65–99)
POTASSIUM: 4.7 mmol/L (ref 3.5–5.1)
Sodium: 136 mmol/L (ref 135–145)
TOTAL PROTEIN: 5.5 g/dL — AB (ref 6.5–8.1)

## 2015-05-30 LAB — URINALYSIS, ROUTINE W REFLEX MICROSCOPIC
GLUCOSE, UA: NEGATIVE mg/dL
KETONES UR: NEGATIVE mg/dL
Nitrite: NEGATIVE
PH: 5 (ref 5.0–8.0)
Protein, ur: NEGATIVE mg/dL
Specific Gravity, Urine: 1.02 (ref 1.005–1.030)

## 2015-05-30 LAB — URINE MICROSCOPIC-ADD ON: Squamous Epithelial / LPF: NONE SEEN

## 2015-05-30 MED ORDER — SODIUM CHLORIDE 0.9 % IV SOLN
60.0000 mg/m2 | Freq: Once | INTRAVENOUS | Status: AC
  Administered 2015-05-30: 110 mg via INTRAVENOUS
  Filled 2015-05-30: qty 5.5

## 2015-05-30 MED ORDER — SODIUM CHLORIDE 0.9 % IV SOLN
10.0000 mg | Freq: Once | INTRAVENOUS | Status: AC
  Administered 2015-05-30: 10 mg via INTRAVENOUS
  Filled 2015-05-30: qty 100

## 2015-05-30 MED ORDER — LORAZEPAM 2 MG/ML IJ SOLN: 1.0000 mg | Freq: Once | INTRAMUSCULAR | Status: DC

## 2015-05-30 MED ORDER — PALONOSETRON HCL INJECTION 0.25 MG/5ML
0.2500 mg | Freq: Once | INTRAVENOUS | Status: AC
  Administered 2015-05-30: 0.25 mg via INTRAVENOUS
  Filled 2015-05-30: qty 5

## 2015-05-30 MED ORDER — HOT PACK MISC ONCOLOGY
1.0000 | Freq: Once | Status: AC | PRN
  Filled 2015-05-30: qty 1

## 2015-05-30 MED ORDER — HEPARIN SOD (PORK) LOCK FLUSH 100 UNIT/ML IV SOLN: 500.0000 [IU] | Freq: Once | INTRAVENOUS | Status: DC | PRN

## 2015-05-30 MED ORDER — SODIUM CHLORIDE 0.9% FLUSH: 10.0000 mL | INTRAVENOUS | Status: DC | PRN

## 2015-05-30 MED ORDER — ALTEPLASE 2 MG IJ SOLR
2.0000 mg | Freq: Once | INTRAMUSCULAR | Status: DC | PRN
  Filled 2015-05-30: qty 2

## 2015-05-30 MED ORDER — SODIUM CHLORIDE 0.9 % IV SOLN
Freq: Once | INTRAVENOUS | Status: AC
  Administered 2015-05-30: 12:00:00 via INTRAVENOUS

## 2015-05-30 MED ORDER — SODIUM CHLORIDE 0.9% FLUSH: 3.0000 mL | INTRAVENOUS | Status: DC | PRN

## 2015-05-30 MED ORDER — SODIUM CHLORIDE 0.9 % IV SOLN
197.4000 mg | Freq: Once | INTRAVENOUS | Status: AC
  Administered 2015-05-30: 200 mg via INTRAVENOUS
  Filled 2015-05-30: qty 20

## 2015-05-30 MED ORDER — HEPARIN SOD (PORK) LOCK FLUSH 100 UNIT/ML IV SOLN: 250.0000 [IU] | Freq: Once | INTRAVENOUS | Status: DC | PRN

## 2015-05-30 NOTE — Progress Notes (Signed)
[facility]                                                                              Patient Demographics  Shane Vasquez, is a 65 y.o. male, DOB - 1950-09-09, PYK:[id number]  Admit date - 05/29/2015   Admitting Physician Albertine Patricia, MD  Outpatient Primary MD for the patient is [name], MD  LOS - 1   No chief complaint on file.     HPI on 05/29/2015  Shane Vasquez is a 65 y.o. 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 Worthy Flank office (oncology) today and was found to have fatigue as well as weakness with poor oral intake and weight loss. Patient has also complained of having the hiccups over the last several days. He also complains of back pain as well as abdominal pain and nausea. Patient denies any headache, visual changes, dizziness. Patient does have a history of smoking but quit approximate 5 years ago. At this time, patient states he is very sleepy. He denies any chest pain, shortness of breath, problems urinating. Patient directly admitted from Dr. Worthy Flank office for palliative chemotherapy. In the office, labs were drawn, patient found to have leukocytosis, thrombocytopenia.   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 Plan to start palliative chemotherapy -Will order MRI brain without contrast  Acute kidney injury vs Chronic kidney disease, stage III -Secondary dehydration and poor oral intake -Creatinine was normal in 2016, however, earlier April 2017, creatinine 1.6 -Upon admission, Creatinine 1.8 -Hold HCTZ -Will place on IV fluids and continue to monitor BMP  Abdominal pain with nausea/ elevated liver enzymes -Continue pain control and antiemetics as needed -LFTs elevated, likely secondary to liver metastasis -LFTs trending downward -Continue to monitor  CMP  Thrombocytopenia -Likely secondary to the above -Currently no evidence of bleeding, continue to monitor CBC  Leukocytosis -Possibly reactive vs infectious -No complaints of SOB or cough, or problems urinating. -Will continue to Monitor -Obtain CXR and UA  Malnutrition/FTT -Will consult nutrition -Will place on IVF  Hiccups -Continue Thorazine 25 mg every 8 hours when necessary  Essential hypertension -Pressure has been soft. Will continue to monitor closely -Hold HCTZ  Depression -Continue Celexa  Code Status: DNR  Family Communication: Wife at bedside  Disposition Plan: Admitted. Pending MRI brain and start chemo.  Time Spent in minutes   30 minutes  Procedures  None  Consults   Oncology  DVT Prophylaxis  SCDs  Lab Results  Component Value Date   PLT 72* 05/30/2015    Medications  Scheduled Meds: . sodium chloride   Intravenous Once  . CARBOplatin  200 mg Intravenous Once  . citalopram  40 mg Oral Daily  . dexamethasone (DECADRON) IVPB [facility]  10 mg Intravenous Once  . docusate sodium  100 mg Oral Daily  . etoposide  60 mg/m2 (Treatment Plan Actual) Intravenous Once  . LORazepam  1 mg Intravenous Once  . palonosetron  0.25 mg Intravenous Once   Continuous Infusions: . sodium chloride 100 mL/hr at 05/30/15 0316  PRN Meds:.acetaminophen **OR** acetaminophen, alteplase, chlorproMAZINE (THORAZINE) injection, heparin lock flush, heparin lock flush, Hot Pack, morphine injection, ondansetron **OR** ondansetron (ZOFRAN) IV, oxyCODONE, polyethylene glycol, senna, sodium chloride flush, sodium chloride flush  Antibiotics    Anti-infectives    None      Subjective:   Vondell Kittelson seen and examined today.  Patient currently drowsy.  Patient denies dizziness, chest pain, shortness of breath, abdominal pain.    Objective:   Filed Vitals:   05/29/15 1626 05/29/15 2040 05/30/15 0436  BP: 97/58 99/51 101/53  Pulse: 92 90 91  Temp: 97.3 F (36.3  C) 98.1 F (36.7 C) 97.9 F (36.6 C)  TempSrc: Oral Oral Oral  Resp: '18 16 18  '$ Height: '5\' 9"'$  (1.753 m)    Weight: 71.668 kg (158 lb)  71.532 kg (157 lb 11.2 oz)  SpO2: 94% 93% 94%    Wt Readings from Last 3 Encounters:  05/30/15 71.532 kg (157 lb 11.2 oz)  05/29/15 71.85 kg (158 lb 6.4 oz)  05/20/15 73.483 kg (162 lb)     Intake/Output Summary (Last 24 hours) at 05/30/15 1125 Last data filed at 05/30/15 0550  Gross per 24 hour  Intake 1583.33 ml  Output      0 ml  Net 1583.33 ml    Exam  General: Well developed, thin, NAD  HEENT: NCAT, mucous membranes moist  Cardiovascular: S1 S2 auscultated, no murmurs, RRR  Respiratory: Clear to auscultation bilaterally  Abdomen: Soft, nontender, nondistended, + bowel sounds  Extremities: warm dry without cyanosis clubbing or edema  Neuro: AAOx3, drowsy, nonfocal  Skin: Without rashes exudates or nodules  Psych: Normal affect and demeanor  Data Review   Micro Results Recent Results (from the past 240 hour(s))  TECHNOLOGIST REVIEW     Status: None   Collection Time: 05/29/15  1:13 PM  Result Value Ref Range Status   Technologist Review 5% myelocytes  Final    Radiology Reports Ct Abdomen Pelvis Wo Contrast  05/18/2015  CLINICAL DATA:  65 year old male with abdominal pelvic pain, nausea and right lower lung opacity identified on recent chest radiograph. EXAM: CT CHEST, ABDOMEN AND PELVIS WITHOUT CONTRAST TECHNIQUE: Multidetector CT imaging of the chest, abdomen and pelvis was performed following the standard protocol without IV contrast. COMPARISON:  05/18/2015 and prior radiographs.  07/26/2014 chest CT. FINDINGS: CT CHEST Mediastinum/Nodes: Multiple enlarged mediastinal and right hilar lymph nodes are identified. Index nodes include the following: A 3.8 x 3.8 cm right peritracheal node (image 25). A 3.4 x 4.6 cm sub carinal node (image 33). A 1.7 cm right hilar node (image 32). There is no evidence of pericardial effusion.  Ectasia of the ascending aorta measuring 3.5 cm noted. Lungs/Pleura: An ill-defined 7 x 9 cm mass within the central right middle/ lower lobes identified extending to the right hilum. Nodular interstitial thickening within the right lower lobe identified. Ill-defined ground-glass and nodular opacities within the right upper lobe are identified. A small right pleural effusion is noted with pleural nodularity. 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 There is no evidence of left pleural effusion. Emphysema is identified. Musculoskeletal: No acute or suspicious abnormalities identified. CT ABDOMEN AND PELVIS Please note that parenchymal abnormalities may be missed without intravenous contrast. Hepatobiliary: Multiple faint hypodense lesions within the liver are noted. The largest lesion measures 1.5 cm and anterior liver (image 57). The gallbladder is unremarkable. There is no evidence of biliary dilatation. Pancreas: Unremarkable Spleen: Unremarkable Adrenals/Urinary Tract:  Mild bilateral renal atrophy noted. No definite mass, hydronephrosis urinary calculi noted. The bladder and adrenal glands are unremarkable. Stomach/Bowel: Colonic diverticulosis noted without diverticulitis. No evidence bowel obstruction or definite focal bowel wall thickening. Vascular/Lymphatic: Enlarged retroperitoneal and upper abdominal lymph nodes identified. An index 1.8 x 2.1 cm right retroperitoneal node (image 68) noted. Aortic atherosclerotic calcifications noted without aneurysm. Reproductive: Mild prostate enlargement noted. Other: No free fluid, pneumoperitoneum or focal collection. Musculoskeletal: No acute or suspicious abnormalities identified. IMPRESSION: 7 x 9 cm central right middle lobe/lower lobe mass compatible with malignancy. Right lower lobe nodular interstitial thickening,, right upper lobe nodules, right pleural nodules with small right pleural effusion, enlarged  mediastinal, right hilar and upper abdominal lymph nodes, and ill-defined hepatic lesions- compatible with metastatic disease. Electronically Signed   By: Margarette Canada M.D.   On: 05/18/2015 14:51   Dg Chest 1 View  05/19/2015  CLINICAL DATA:  Status post right-sided thoracentesis EXAM: CHEST 1 VIEW COMPARISON:  May 18, 2015 chest CT and chest radiograph FINDINGS: No pneumothorax. There has been near complete resolution of pleural effusion on the right. There is persistent right middle mass with adjacent lobe airspace consolidation. There is scarring and atelectatic change in the left base. The heart size and pulmonary vascularity are within normal limits. Multiple foci of adenopathy are noted throughout the mediastinum superiorly and on the right. IMPRESSION: No pneumothorax. Essentially complete resolution of pleural effusion on the right. Mass with airspace consolidation remains on the right, unchanged. Extensive adenopathy present. Scarring and atelectatic change are noted in the left base. Electronically Signed   By: Lowella Grip III M.D.   On: 05/19/2015 14:03   Dg Chest 2 View  05/29/2015  CLINICAL DATA:  COPD, hypertension, recent diagnosis of small cell lung cancer with metastasis, presented today with fatigue, weakness, weight loss and poor oral intake, hiccups, back pain, abdominal pain, nausea EXAM: CHEST  2 VIEW COMPARISON:  05/19/2015 FINDINGS: Normal heart size and pulmonary vascularity. Mediastinal enlargement corresponding to extensive adenopathy on prior CT. Increased opacity at RIGHT lung base consistent with known tumor, increased atelectasis and pleural effusion. Underlying emphysematous and bronchitic changes. Subsegmental atelectasis and scarring at LEFT base persists. No pneumothorax. Bones demineralized. IMPRESSION: COPD changes with increased RIGHT basilar opacity corresponding to known tumor which increased atelectasis and pleural effusion. Mediastinal adenopathy. Electronically  Signed   By: Lavonia Dana M.D.   On: 05/29/2015 19:59   Dg Chest 2 View  05/18/2015  CLINICAL DATA:  Shortness of breath and dry cough for 1-1/2 weeks. Former smoker for 50 years. EXAM: CHEST  2 VIEW COMPARISON:  Chest x-rays dated 01/02/2015 and 09/19/2014. FINDINGS: New ill-defined airspace opacity within the anterior aspect of the right lower lobe, extending to the right hilum. Chronic scarring/ atelectasis noted at the left lung base. Lungs are hyperexpanded indicating some degree of COPD. Coarse interstitial markings are seen within both lungs which is likely a related chronic interstitial lung disease. Heart size is upper normal. IMPRESSION: New dense ill-defined airspace opacity within the right lower lobe, extending to the right hilum. Additional vague density at the right hilum and within the overlying medial suprahilar region. This could represent pneumonia or airspace collapse related to a central obstructing mass. Recommend chest CT with contrast for further characterization. Hyperexpanded lungs indicating COPD/emphysema. Suspect associated chronic interstitial lung disease bilaterally. Electronically Signed   By: Franki Cabot M.D.   On: 05/18/2015 12:37   Dg Abd 1 View  05/18/2015  CLINICAL DATA:  Lower abdominal pain, constipation. EXAM: ABDOMEN - 1 VIEW COMPARISON:  None. FINDINGS: The bowel gas pattern is normal. Phleboliths are noted in the pelvis. Inferior tip of liver extends iliac wing suggesting hepatomegaly. IMPRESSION: No evidence of bowel obstruction or ileus.  Possible hepatomegaly. Electronically Signed   By: Marijo Conception, M.D.   On: 05/18/2015 11:20   Ct Chest Wo Contrast  05/18/2015  CLINICAL DATA:  65 year old male with abdominal pelvic pain, nausea and right lower lung opacity identified on recent chest radiograph. EXAM: CT CHEST, ABDOMEN AND PELVIS WITHOUT CONTRAST TECHNIQUE: Multidetector CT imaging of the chest, abdomen and pelvis was performed following the standard  protocol without IV contrast. COMPARISON:  05/18/2015 and prior radiographs.  07/26/2014 chest CT. FINDINGS: CT CHEST Mediastinum/Nodes: Multiple enlarged mediastinal and right hilar lymph nodes are identified. Index nodes include the following: A 3.8 x 3.8 cm right peritracheal node (image 25). A 3.4 x 4.6 cm sub carinal node (image 33). A 1.7 cm right hilar node (image 32). There is no evidence of pericardial effusion. Ectasia of the ascending aorta measuring 3.5 cm noted. Lungs/Pleura: An ill-defined 7 x 9 cm mass within the central right middle/ lower lobes identified extending to the right hilum. Nodular interstitial thickening within the right lower lobe identified. Ill-defined ground-glass and nodular opacities within the right upper lobe are identified. A small right pleural effusion is noted with pleural nodularity. 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 There is no evidence of left pleural effusion. Emphysema is identified. Musculoskeletal: No acute or suspicious abnormalities identified. CT ABDOMEN AND PELVIS Please note that parenchymal abnormalities may be missed without intravenous contrast. Hepatobiliary: Multiple faint hypodense lesions within the liver are noted. The largest lesion measures 1.5 cm and anterior liver (image 57). The gallbladder is unremarkable. There is no evidence of biliary dilatation. Pancreas: Unremarkable Spleen: Unremarkable Adrenals/Urinary Tract: Mild bilateral renal atrophy noted. No definite mass, hydronephrosis urinary calculi noted. The bladder and adrenal glands are unremarkable. Stomach/Bowel: Colonic diverticulosis noted without diverticulitis. No evidence bowel obstruction or definite focal bowel wall thickening. Vascular/Lymphatic: Enlarged retroperitoneal and upper abdominal lymph nodes identified. An index 1.8 x 2.1 cm right retroperitoneal node (image 68) noted. Aortic atherosclerotic calcifications noted  without aneurysm. Reproductive: Mild prostate enlargement noted. Other: No free fluid, pneumoperitoneum or focal collection. Musculoskeletal: No acute or suspicious abnormalities identified. IMPRESSION: 7 x 9 cm central right middle lobe/lower lobe mass compatible with malignancy. Right lower lobe nodular interstitial thickening,, right upper lobe nodules, right pleural nodules with small right pleural effusion, enlarged mediastinal, right hilar and upper abdominal lymph nodes, and ill-defined hepatic lesions- compatible with metastatic disease. Electronically Signed   By: Margarette Canada M.D.   On: 05/18/2015 14:51   US Thoracentesis Asp Pleural Space W/img Guide  05/19/2015  INDICATION: Newly diagnosed right lung mass with associated pleural effusion. Request diagnostic and therapeutic thoracentesis EXAM: ULTRASOUND GUIDED RIGHT THORACENTESIS MEDICATIONS: None. COMPLICATIONS: None immediate. PROCEDURE: An ultrasound guided thoracentesis was thoroughly discussed with the patient and questions answered. The benefits, risks, alternatives and complications were also discussed. The patient understands and wishes to proceed with the procedure. Written consent was obtained. Ultrasound was performed to localize and mark an adequate pocket of fluid in the right chest. The area was then prepped and draped in the normal sterile fashion. 1% Lidocaine was used for local anesthesia. Under ultrasound guidance a Safe-T-Centesis catheter was introduced. Thoracentesis was performed. The catheter was removed and a dressing applied.  FINDINGS: A total of approximately 830 mL of hazy, dark yellow fluid was removed. Samples were sent to the laboratory as requested by the clinical team. Limited ultrasound imaging of the liver during this procedure finds numerous lesions suspicious for metastatic disease. IMPRESSION: Successful ultrasound guided right thoracentesis yielding 830 mL of pleural fluid. Read by: Ascencion Dike PA-C Electronically  Signed   By: Lucrezia Europe M.D.   On: 05/19/2015 14:26    CBC  Recent Labs Lab 05/29/15 1313 05/30/15 0341  WBC 16.0* 15.6*  HGB 11.3* 9.9*  HCT 32.5* 28.3*  PLT 78 Large platelets present* 72*  MCV 91.3 90.7  MCH 31.7 31.7  MCHC 34.8 35.0  RDW 15.6* 15.7*  LYMPHSABS 2.0  --   MONOABS 1.5*  --   EOSABS 0.1  --   BASOSABS 0.2*  --     Chemistries   Recent Labs Lab 05/29/15 1313 05/30/15 0341  NA 135* 136  K 4.8 4.7  CL  --  99*  CO2 21* 20*  GLUCOSE 85 100*  BUN 76.9* 83*  CREATININE 1.9* 1.80*  CALCIUM 10.2 9.5  AST 610* 526*  ALT 139* 120*  ALKPHOS 923* 736*  BILITOT 3.73* 4.0*   ------------------------------------------------------------------------------------------------------------------ estimated creatinine clearance is 40.4 mL/min (by C-G formula based on Cr of 1.8). ------------------------------------------------------------------------------------------------------------------ No results for input(s): HGBA1C in the last 72 hours. ------------------------------------------------------------------------------------------------------------------ No results for input(s): CHOL, HDL, LDLCALC, TRIG, CHOLHDL, LDLDIRECT in the last 72 hours. ------------------------------------------------------------------------------------------------------------------ No results for input(s): TSH, T4TOTAL, T3FREE, THYROIDAB in the last 72 hours.  Invalid input(s): FREET3 ------------------------------------------------------------------------------------------------------------------ No results for input(s): VITAMINB12, FOLATE, FERRITIN, TIBC, IRON, RETICCTPCT in the last 72 hours.  Coagulation profile No results for input(s): INR, PROTIME in the last 168 hours.  No results for input(s): DDIMER in the last 72 hours.  Cardiac Enzymes No results for input(s): CKMB, TROPONINI, MYOGLOBIN in the last 168 hours.  Invalid input(s):  CK ------------------------------------------------------------------------------------------------------------------ Invalid input(s): POCBNP    Edon Hoadley D.O. on 05/30/2015 at 11:25 AM  Between 7am to 7pm - Pager - [phone]  After 7pm go to [email or web address] - password [email or web address]  And look for the night coverage person covering for me after hours  [facility] Office  [phone]

## 2015-05-30 NOTE — Progress Notes (Signed)
Right FA IV site of 05/29/15 with excellent blood return, used for chemo administration.  Pt tol chemo without incident.

## 2015-05-30 NOTE — Progress Notes (Signed)
Initial chemotherapy (Carboplatina and Etoposide) begun with wife and brother.  Patient sleeping soundly after pain medication.  Wife signed consent for Carboplatin and Etoposide.

## 2015-05-30 NOTE — Progress Notes (Signed)
Manual calculation of BSA and dosing for carboplatin and etoposide completed.  Secondary verification by Drue Dun, RN

## 2015-05-30 NOTE — Progress Notes (Signed)
Subjective: The patient is seen and examined today. His wife and brother were at the bedside. He continues to have increasing fatigue and weakness. He feels a little bit better compared to yesterday. She still has abdominal pain and received pain medication a few minutes before my visit today. He has no current nausea or vomiting. He has no fever or chills.  Objective: Vital signs in last 24 hours: Temp:  [97.3 F (36.3 C)-98.3 F (36.8 C)] 97.9 F (36.6 C) (04/14 0436) Pulse Rate:  [90-92] 91 (04/14 0436) Resp:  [16-18] 18 (04/14 0436) BP: (96-101)/(51-82) 101/53 mmHg (04/14 0436) SpO2:  [93 %-97 %] 94 % (04/14 0436) Weight:  [157 lb 11.2 oz (71.532 kg)-158 lb 6.4 oz (71.85 kg)] 157 lb 11.2 oz (71.532 kg) (04/14 0436)  Intake/Output from previous day: 04/13 0701 - 04/14 0700 In: 1583.3 [P.O.:300; I.V.:1283.3] Out: -  Intake/Output this shift:    General appearance: alert, cooperative, fatigued and icteric Resp: diminished breath sounds RLL and dullness to percussion RLL Cardio: regular rate and rhythm, S1, S2 normal, no murmur, click, rub or gallop GI: soft, non-tender; bowel sounds normal; no masses,  no organomegaly Extremities: extremities normal, atraumatic, no cyanosis or edema  Lab Results:   Recent Labs  05/29/15 1313 05/30/15 0341  WBC 16.0* 15.6*  HGB 11.3* 9.9*  HCT 32.5* 28.3*  PLT 78 Large platelets present* 72*   BMET  Recent Labs  05/29/15 1313 05/30/15 0341  NA 135* 136  K 4.8 4.7  CL  --  99*  CO2 21* 20*  GLUCOSE 85 100*  BUN 76.9* 83*  CREATININE 1.9* 1.80*  CALCIUM 10.2 9.5    Studies/Results: Dg Chest 2 View  05/29/2015  CLINICAL DATA:  COPD, hypertension, recent diagnosis of small cell lung cancer with metastasis, presented today with fatigue, weakness, weight loss and poor oral intake, hiccups, back pain, abdominal pain, nausea EXAM: CHEST  2 VIEW COMPARISON:  05/19/2015 FINDINGS: Normal heart size and pulmonary vascularity. Mediastinal  enlargement corresponding to extensive adenopathy on prior CT. Increased opacity at RIGHT lung base consistent with known tumor, increased atelectasis and pleural effusion. Underlying emphysematous and bronchitic changes. Subsegmental atelectasis and scarring at LEFT base persists. No pneumothorax. Bones demineralized. IMPRESSION: COPD changes with increased RIGHT basilar opacity corresponding to known tumor which increased atelectasis and pleural effusion. Mediastinal adenopathy. Electronically Signed   By: Lavonia Dana M.D.   On: 05/29/2015 19:59    Medications: I have reviewed the patient's current medications.  CODE STATUS: No CODE BLUE  Assessment/Plan: 1) extensive stage small cell lung cancer: The patient will start systemic chemotherapy with reduced dose carboplatin for AUC of 3 on day 1 and etoposide 60 MG/M2 on days 1, 2 and 3. First dose today. I reminded the patient adverse effect of this treatment including but not limited to alopecia, myelosuppression, nausea and vomiting, peripheral neuropathy, liver or renal dysfunction. The patient and his family understand the risks of his treatment under his current condition with the liver, renal and bone marrow dysfunction. There would like to continue with the treatment as planned. He is also scheduled to have MRI of the brain later today to complete the staging workup. 2) pain management: The patient is currently on morphine sulfate 2 mg IV every 4 hours as needed for pain. 3) nausea and vomiting: He is currently on Zofran. 4) malnutrition and dehydration: Continue IV hydration and consider dietitian consult. 5) hiccups: Continue Thorazine as needed. Thank you so much for taking good  care of Mr. Baltz, I will continue to follow up the patient with you and assist in his management on as-needed basis.  LOS: 1 day    Britain Anagnos K. 05/30/2015

## 2015-05-30 NOTE — Progress Notes (Signed)
Initial Nutrition Assessment  DOCUMENTATION CODES:   Severe malnutrition in context of acute illness/injury  INTERVENTION:  - RD will follow-up 06/01/15 for full discussion and provide interventions at that time  NUTRITION DIAGNOSIS:   Increased nutrient needs related to catabolic illness, cancer and cancer related treatments as evidenced by estimated needs.  GOAL:   Patient will meet greater than or equal to 90% of their needs  MONITOR:   PO intake, Weight trends, Labs, I & O's  REASON FOR ASSESSMENT:   Malnutrition Screening Tool, Consult  (new chemotherapy pt)  ASSESSMENT:   65 y.o. male with a history of COPD, hypertension recently diagnosed small cell lung cancer with metastasis, that presented to Dr. Worthy Flank office (oncology) today and was found to have fatigue as well as weakness with poor oral intake and weight loss. Patient has also complained of having the hiccups over the last several days. He also complains of back pain as well as abdominal pain and nausea. Patient denies any headache, visual changes, dizziness. Patient does have a history of smoking but quit approximate 5 years ago. Patient directly admitted from Dr. Worthy Flank office for palliative chemotherapy.  Pt seen for MST and consult. BMI indicates normal weight. No intakes documented since admission. Brother and wife at bedside at time of visit and state that they have been attempting to give pt bites of scrambled egg and sips of water. Brother states that pt has not been eating well the past few days and intakes at this time are all pt has had in the past 3 days. Attempted to engage with pt but he was unable to respond to questions; slight twitching to extremities noted during time of visit and pt was attempting to bring a cup with a straw to his mouth and was unable.  Brother politely asks that RD visit occur at a later time so that family may continue to work with and comfort pt. Pt not meeting needs. Will  hold off on supplements until discussion can be had with pt/family.   Unable to complete physical assessment at this time. Per chart review, pt has lost 5 lbs (3% body weight) in the past 10 days which is significant for time frame. Medications reviewed. IVF: NS @ 100 mL/hr. Labs reviewed; Cl: 99 mmol/L, BUN/creatinine elevated, LFTs elevated but trending down, GFR: 38.   Diet Order:  Diet regular Room service appropriate?: Yes; Fluid consistency:: Thin  Skin:  Reviewed, no issues  Last BM:  4/14  Height:   Ht Readings from Last 1 Encounters:  05/29/15 '5\' 9"'$  (1.753 m)    Weight:   Wt Readings from Last 1 Encounters:  05/30/15 157 lb 11.2 oz (71.532 kg)    Ideal Body Weight:  72.73 kg (kg)  BMI:  Body mass index is 23.28 kg/(m^2).  Estimated Nutritional Needs:   Kcal:  2150-2350 (30-33 kcal/kg)  Protein:  100-115 grams (1.4-1.6 grams/kg)  Fluid:  >/= 2 L/day  EDUCATION NEEDS:   No education needs identified at this time     Jarome Matin, RD, LDN Inpatient Clinical Dietitian Pager # [phone] After hours/weekend pager # [phone]

## 2015-05-31 DIAGNOSIS — R748 Abnormal levels of other serum enzymes: Secondary | ICD-10-CM

## 2015-05-31 DIAGNOSIS — E43 Unspecified severe protein-calorie malnutrition: Secondary | ICD-10-CM

## 2015-05-31 DIAGNOSIS — E790 Hyperuricemia without signs of inflammatory arthritis and tophaceous disease: Secondary | ICD-10-CM | POA: Insufficient documentation

## 2015-05-31 DIAGNOSIS — D61810 Antineoplastic chemotherapy induced pancytopenia: Secondary | ICD-10-CM

## 2015-05-31 DIAGNOSIS — E440 Moderate protein-calorie malnutrition: Secondary | ICD-10-CM

## 2015-05-31 DIAGNOSIS — N183 Chronic kidney disease, stage 3 (moderate): Secondary | ICD-10-CM

## 2015-05-31 DIAGNOSIS — C787 Secondary malignant neoplasm of liver and intrahepatic bile duct: Secondary | ICD-10-CM

## 2015-05-31 LAB — CBC
HEMATOCRIT: 27.8 % — AB (ref 39.0–52.0)
Hemoglobin: 9.7 g/dL — ABNORMAL LOW (ref 13.0–17.0)
MCH: 31.7 pg (ref 26.0–34.0)
MCHC: 34.9 g/dL (ref 30.0–36.0)
MCV: 90.8 fL (ref 78.0–100.0)
PLATELETS: 76 10*3/uL — AB (ref 150–400)
RBC: 3.06 MIL/uL — ABNORMAL LOW (ref 4.22–5.81)
RDW: 16.1 % — AB (ref 11.5–15.5)
WBC: 17.5 10*3/uL — AB (ref 4.0–10.5)

## 2015-05-31 LAB — COMPREHENSIVE METABOLIC PANEL
ALK PHOS: 775 U/L — AB (ref 38–126)
ALT: 134 U/L — AB (ref 17–63)
AST: 637 U/L — AB (ref 15–41)
Albumin: 2.1 g/dL — ABNORMAL LOW (ref 3.5–5.0)
Anion gap: 16 — ABNORMAL HIGH (ref 5–15)
BUN: 99 mg/dL — AB (ref 6–20)
CALCIUM: 8.9 mg/dL (ref 8.9–10.3)
CHLORIDE: 104 mmol/L (ref 101–111)
CO2: 18 mmol/L — AB (ref 22–32)
CREATININE: 1.77 mg/dL — AB (ref 0.61–1.24)
GFR, EST AFRICAN AMERICAN: 44 mL/min — AB (ref >60)
GFR, EST NON AFRICAN AMERICAN: 38 mL/min — AB (ref >60)
Glucose, Bld: 141 mg/dL — ABNORMAL HIGH (ref 65–99)
Potassium: 4.9 mmol/L (ref 3.5–5.1)
SODIUM: 138 mmol/L (ref 135–145)
Total Bilirubin: 4.7 mg/dL — ABNORMAL HIGH (ref 0.3–1.2)
Total Protein: 5.6 g/dL — ABNORMAL LOW (ref 6.5–8.1)

## 2015-05-31 LAB — URIC ACID: Uric Acid, Serum: 18.3 mg/dL — ABNORMAL HIGH (ref 4.4–7.6)

## 2015-05-31 MED ORDER — SODIUM CHLORIDE 0.9 % IV SOLN
10.0000 mg | Freq: Once | INTRAVENOUS | Status: AC
  Administered 2015-05-31: 10 mg via INTRAVENOUS
  Filled 2015-05-31: qty 1

## 2015-05-31 MED ORDER — IPRATROPIUM-ALBUTEROL 0.5-2.5 (3) MG/3ML IN SOLN: 3.0000 mL | Freq: Four times a day (QID) | RESPIRATORY_TRACT | Status: DC

## 2015-05-31 MED ORDER — FUROSEMIDE 10 MG/ML IJ SOLN
INTRAMUSCULAR | Status: AC
  Filled 2015-05-31: qty 4

## 2015-05-31 MED ORDER — SODIUM CHLORIDE 0.9 % IV SOLN
6.0000 mg | Freq: Once | INTRAVENOUS | Status: DC
  Filled 2015-05-31: qty 4

## 2015-05-31 MED ORDER — SODIUM CHLORIDE 0.9 % IV SOLN
6.0000 mg | Freq: Once | INTRAVENOUS | Status: AC
  Administered 2015-05-31: 6 mg via INTRAVENOUS
  Filled 2015-05-31: qty 4

## 2015-05-31 MED ORDER — IPRATROPIUM-ALBUTEROL 0.5-2.5 (3) MG/3ML IN SOLN
RESPIRATORY_TRACT | Status: AC
  Filled 2015-05-31: qty 3

## 2015-05-31 MED ORDER — ETOPOSIDE CHEMO INJECTION 1 GM/50ML
60.0000 mg/m2 | Freq: Once | INTRAVENOUS | Status: AC
  Administered 2015-05-31: 110 mg via INTRAVENOUS
  Filled 2015-05-31: qty 5.5

## 2015-05-31 MED ORDER — FUROSEMIDE 10 MG/ML IJ SOLN
40.0000 mg | Freq: Once | INTRAMUSCULAR | Status: AC
Start: 2015-05-31 — End: 2015-05-31
  Administered 2015-05-31: 40 mg via INTRAVENOUS

## 2015-05-31 MED ORDER — HOT PACK MISC ONCOLOGY
1.0000 | Freq: Once | Status: AC | PRN
  Filled 2015-05-31: qty 1

## 2015-05-31 MED ORDER — IPRATROPIUM-ALBUTEROL 0.5-2.5 (3) MG/3ML IN SOLN: 3.0000 mL | RESPIRATORY_TRACT | Status: DC | PRN

## 2015-05-31 NOTE — Progress Notes (Signed)
Shane Vasquez   DOB:1950-04-07   UR#:[id number]    Subjective: This patient has extensive stage small cell lung cancer, received first dose of chemotherapy on 05/30/2015. He is due for second dose chemotherapy today. So far he tolerated treatment well. He continues to have significant hiccups. Denies nausea or vomiting. No new neurological deficits.  Objective:  Filed Vitals:   05/30/15 1430 05/30/15 2139  BP: 103/57 101/55  Pulse: 85 77  Temp: 97.3 F (36.3 C) 97.9 F (36.6 C)  Resp: 18 16     Intake/Output Summary (Last 24 hours) at 05/31/15 0746 Last data filed at 05/31/15 0630  Gross per 24 hour  Intake 2771.67 ml  Output    725 ml  Net 2046.67 ml    GENERAL:alert, no distress and comfortable SKIN: skin color, texture, turgor are normal, no rashes or significant lesions EYES: normal, Conjunctiva are pink and non-injected, sclera clear OROPHARYNX:no exudate, no erythema and lips, buccal mucosa, and tongue normal  NECK: supple, thyroid normal size, non-tender, without nodularity LYMPH:  no palpable lymphadenopathy in the cervical, axillary or inguinal LUNGS: clear to auscultation and percussion with normal breathing effort HEART: regular rate & rhythm and no murmurs and no lower extremity edema ABDOMEN:abdomen soft, distended, non-tender and normal bowel sounds Musculoskeletal:no cyanosis of digits and no clubbing  NEURO: alert & oriented x 3 with fluent speech, no focal motor/sensory deficits. He appears depressed   Labs:  Lab Results  Component Value Date   WBC 17.5* 05/31/2015   HGB 9.7* 05/31/2015   HCT 27.8* 05/31/2015   MCV 90.8 05/31/2015   PLT 76* 05/31/2015   NEUTROABS 12.2* 05/29/2015    Lab Results  Component Value Date   NA 138 05/31/2015   K 4.9 05/31/2015   CL 104 05/31/2015   CO2 18* 05/31/2015    Studies:  Dg Chest 2 View  05/29/2015  CLINICAL DATA:  COPD, hypertension, recent diagnosis of small cell lung cancer with metastasis, presented today  with fatigue, weakness, weight loss and poor oral intake, hiccups, back pain, abdominal pain, nausea EXAM: CHEST  2 VIEW COMPARISON:  05/19/2015 FINDINGS: Normal heart size and pulmonary vascularity. Mediastinal enlargement corresponding to extensive adenopathy on prior CT. Increased opacity at RIGHT lung base consistent with known tumor, increased atelectasis and pleural effusion. Underlying emphysematous and bronchitic changes. Subsegmental atelectasis and scarring at LEFT base persists. No pneumothorax. Bones demineralized. IMPRESSION: COPD changes with increased RIGHT basilar opacity corresponding to known tumor which increased atelectasis and pleural effusion. Mediastinal adenopathy. Electronically Signed   By: Lavonia Dana M.D.   On: 05/29/2015 19:59    Assessment & Plan:  Extensive stage small cell lung cancer with known liver metastasis He is receiving palliative chemotherapy. Continue treatment and aggressive supportive care  Elevated liver enzymes due to metastatic disease He has persistent elevated liver enzymes No signs of encephalopathy  Pancytopenia with anemia and thrombocytopenia Could be related to extensive disease He is not symptomatic. Monitor carefully. No contraindication to anticoagulation therapy as well as platelet count is greater than 50,000  Hiccups Could be related to liver metastasis versus CNS metastasis MRI brain is pending  Chronic kidney disease stage III Cause unknown. Continue aggressive fluid resuscitation  Significant hyperuricemia Could be due to spontaneous signs of tumor lysis I recommend 1 dose of Rasburicase today and continue aggressive IV fluid resuscitation Recheck tomorrow and monitor carefully  Protein calorie malnutrition, moderate Nutritional supplements as tolerated  CODE STATUS DO NOT RESUSCITATE  Discharge planning Note  ready for discharge until next week  [facility], Maudene Stotler, MD 05/31/2015  7:46 AM

## 2015-05-31 NOTE — Progress Notes (Signed)
[facility]                                                                              Patient Demographics  Shane Vasquez, is a 65 y.o. male, DOB - 1950/10/12, GNF:[id number]  Admit date - 05/29/2015   Admitting Physician Albertine Patricia, MD  Outpatient Primary MD for the patient is [name], MD  LOS - 2   No chief complaint on file.     HPI on 05/29/2015  Shane Vasquez is a 65 y.o. 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 Worthy Flank office (oncology) today and was found to have fatigue as well as weakness with poor oral intake and weight loss. Patient has also complained of having the hiccups over the last several days. He also complains of back pain as well as abdominal pain and nausea. Patient denies any headache, visual changes, dizziness. Patient does have a history of smoking but quit approximate 5 years ago. At this time, patient states he is very sleepy. He denies any chest pain, shortness of breath, problems urinating. Patient directly admitted from Dr. Worthy Flank office for palliative chemotherapy. In the office, labs were drawn, patient found to have leukocytosis, thrombocytopenia.   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 Started on chemotherapy- 2/3 treatments -MRI brain without contrast pending   Acute kidney injury vs Chronic kidney disease, stage III -Secondary dehydration and poor oral intake -Creatinine was normal in 2016, however, earlier April 2017, creatinine 1.6 -Upon admission, Creatinine 1.8 -Hold HCTZ -Will place on IV fluids and continue to monitor BMP  Abdominal pain with nausea/ elevated liver enzymes -Continue pain control and antiemetics as needed -LFTs elevated, likely secondary to liver metastasis -Continue to monitor  CMP  Thrombocytopenia -Likely secondary to the above -Currently no evidence of bleeding, continue to monitor CBC  Leukocytosis -Possibly reactive vs infectious -No complaints of SOB or cough, or problems urinating. -Will continue to Monitor  Hiccups -Continue Thorazine 25 mg every 8 hours when necessary  Hyperuricemia -Possibly due to tumor lysis -Given rasburicase by oncology -Continue to monitor   Essential hypertension -Pressure has been soft. Will continue to monitor closely -Hold HCTZ  Depression -Continue Celexa  Severe malnutrition/FTT -Likely secondary to the above -nutrition consulted  Code Status: DNR  Family Communication: None at bedside  Disposition Plan: Admitted. Pending MRI brain and continue chemo  Time Spent in minutes   30 minutes  Procedures  None  Consults   Oncology  DVT Prophylaxis  SCDs  Lab Results  Component Value Date   PLT 76* 05/31/2015    Medications  Scheduled Meds: . citalopram  40 mg Oral Daily  . docusate sodium  100 mg Oral Daily  . etoposide  60 mg/m2 (Treatment Plan Actual) Intravenous Once  . LORazepam  1 mg Intravenous Once   Continuous Infusions: . sodium chloride 100 mL/hr at 05/31/15 1138   PRN Meds:.acetaminophen **OR** acetaminophen, alteplase, chlorproMAZINE (THORAZINE) injection, heparin lock flush, heparin lock flush, Hot Pack, morphine injection, ondansetron **OR** ondansetron (ZOFRAN) IV, oxyCODONE,  polyethylene glycol, senna, sodium chloride flush, sodium chloride flush  Antibiotics    Anti-infectives    None      Subjective:   Shane Vasquez seen and examined today.  Patient feels his hiccups are better.  Feels sleepy this morning.  Patient denies dizziness, chest pain, shortness of breath, abdominal pain.    Objective:   Filed Vitals:   05/29/15 2040 05/30/15 0436 05/30/15 1430 05/30/15 2139  BP: 99/51 101/53 103/57 101/55  Pulse: 90 91 85 77  Temp: 98.1 F (36.7 C) 97.9 F (36.6 C)  97.3 F (36.3 C) 97.9 F (36.6 C)  TempSrc: Oral Oral Oral Oral  Resp: '16 18 18 16  '$ Height:      Weight:  71.532 kg (157 lb 11.2 oz)  71.578 kg (157 lb 12.8 oz)  SpO2: 93% 94% 94% 90%    Wt Readings from Last 3 Encounters:  05/30/15 71.578 kg (157 lb 12.8 oz)  05/29/15 71.85 kg (158 lb 6.4 oz)  05/20/15 73.483 kg (162 lb)     Intake/Output Summary (Last 24 hours) at 05/31/15 1227 Last data filed at 05/31/15 0900  Gross per 24 hour  Intake 2831.67 ml  Output    775 ml  Net 2056.67 ml    Exam (no change)  General: Well developed, thin, NAD  HEENT: NCAT, mucous membranes moist  Cardiovascular: S1 S2 auscultated, no murmurs, RRR  Respiratory: Clear to auscultation bilaterally  Abdomen: Soft, nontender, nondistended, + bowel sounds  Extremities: warm dry without cyanosis clubbing or edema  Neuro: AAOx3, drowsy, nonfocal  Skin: Without rashes exudates or nodules  Psych: Normal affect and demeanor  Data Review   Micro Results Recent Results (from the past 240 hour(s))  TECHNOLOGIST REVIEW     Status: None   Collection Time: 05/29/15  1:13 PM  Result Value Ref Range Status   Technologist Review 5% myelocytes  Final    Radiology Reports Ct Abdomen Pelvis Wo Contrast  05/18/2015  CLINICAL DATA:  65 year old male with abdominal pelvic pain, nausea and right lower lung opacity identified on recent chest radiograph. EXAM: CT CHEST, ABDOMEN AND PELVIS WITHOUT CONTRAST TECHNIQUE: Multidetector CT imaging of the chest, abdomen and pelvis was performed following the standard protocol without IV contrast. COMPARISON:  05/18/2015 and prior radiographs.  07/26/2014 chest CT. FINDINGS: CT CHEST Mediastinum/Nodes: Multiple enlarged mediastinal and right hilar lymph nodes are identified. Index nodes include the following: A 3.8 x 3.8 cm right peritracheal node (image 25). A 3.4 x 4.6 cm sub carinal node (image 33). A 1.7 cm right hilar node (image 32). There is no evidence of  pericardial effusion. Ectasia of the ascending aorta measuring 3.5 cm noted. Lungs/Pleura: An ill-defined 7 x 9 cm mass within the central right middle/ lower lobes identified extending to the right hilum. Nodular interstitial thickening within the right lower lobe identified. Ill-defined ground-glass and nodular opacities within the right upper lobe are identified. A small right pleural effusion is noted with pleural nodularity. 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 There is no evidence of left pleural effusion. Emphysema is identified. Musculoskeletal: No acute or suspicious abnormalities identified. CT ABDOMEN AND PELVIS Please note that parenchymal abnormalities may be missed without intravenous contrast. Hepatobiliary: Multiple faint hypodense lesions within the liver are noted. The largest lesion measures 1.5 cm and anterior liver (image 57). The gallbladder is unremarkable. There is no evidence of biliary dilatation. Pancreas: Unremarkable Spleen: Unremarkable Adrenals/Urinary Tract: Mild bilateral renal atrophy  noted. No definite mass, hydronephrosis urinary calculi noted. The bladder and adrenal glands are unremarkable. Stomach/Bowel: Colonic diverticulosis noted without diverticulitis. No evidence bowel obstruction or definite focal bowel wall thickening. Vascular/Lymphatic: Enlarged retroperitoneal and upper abdominal lymph nodes identified. An index 1.8 x 2.1 cm right retroperitoneal node (image 68) noted. Aortic atherosclerotic calcifications noted without aneurysm. Reproductive: Mild prostate enlargement noted. Other: No free fluid, pneumoperitoneum or focal collection. Musculoskeletal: No acute or suspicious abnormalities identified. IMPRESSION: 7 x 9 cm central right middle lobe/lower lobe mass compatible with malignancy. Right lower lobe nodular interstitial thickening,, right upper lobe nodules, right pleural nodules with small right pleural  effusion, enlarged mediastinal, right hilar and upper abdominal lymph nodes, and ill-defined hepatic lesions- compatible with metastatic disease. Electronically Signed   By: Margarette Canada M.D.   On: 05/18/2015 14:51   Dg Chest 1 View  05/19/2015  CLINICAL DATA:  Status post right-sided thoracentesis EXAM: CHEST 1 VIEW COMPARISON:  May 18, 2015 chest CT and chest radiograph FINDINGS: No pneumothorax. There has been near complete resolution of pleural effusion on the right. There is persistent right middle mass with adjacent lobe airspace consolidation. There is scarring and atelectatic change in the left base. The heart size and pulmonary vascularity are within normal limits. Multiple foci of adenopathy are noted throughout the mediastinum superiorly and on the right. IMPRESSION: No pneumothorax. Essentially complete resolution of pleural effusion on the right. Mass with airspace consolidation remains on the right, unchanged. Extensive adenopathy present. Scarring and atelectatic change are noted in the left base. Electronically Signed   By: Lowella Grip III M.D.   On: 05/19/2015 14:03   Dg Chest 2 View  05/29/2015  CLINICAL DATA:  COPD, hypertension, recent diagnosis of small cell lung cancer with metastasis, presented today with fatigue, weakness, weight loss and poor oral intake, hiccups, back pain, abdominal pain, nausea EXAM: CHEST  2 VIEW COMPARISON:  05/19/2015 FINDINGS: Normal heart size and pulmonary vascularity. Mediastinal enlargement corresponding to extensive adenopathy on prior CT. Increased opacity at RIGHT lung base consistent with known tumor, increased atelectasis and pleural effusion. Underlying emphysematous and bronchitic changes. Subsegmental atelectasis and scarring at LEFT base persists. No pneumothorax. Bones demineralized. IMPRESSION: COPD changes with increased RIGHT basilar opacity corresponding to known tumor which increased atelectasis and pleural effusion. Mediastinal  adenopathy. Electronically Signed   By: Lavonia Dana M.D.   On: 05/29/2015 19:59   Dg Chest 2 View  05/18/2015  CLINICAL DATA:  Shortness of breath and dry cough for 1-1/2 weeks. Former smoker for 50 years. EXAM: CHEST  2 VIEW COMPARISON:  Chest x-rays dated 01/02/2015 and 09/19/2014. FINDINGS: New ill-defined airspace opacity within the anterior aspect of the right lower lobe, extending to the right hilum. Chronic scarring/ atelectasis noted at the left lung base. Lungs are hyperexpanded indicating some degree of COPD. Coarse interstitial markings are seen within both lungs which is likely a related chronic interstitial lung disease. Heart size is upper normal. IMPRESSION: New dense ill-defined airspace opacity within the right lower lobe, extending to the right hilum. Additional vague density at the right hilum and within the overlying medial suprahilar region. This could represent pneumonia or airspace collapse related to a central obstructing mass. Recommend chest CT with contrast for further characterization. Hyperexpanded lungs indicating COPD/emphysema. Suspect associated chronic interstitial lung disease bilaterally. Electronically Signed   By: Franki Cabot M.D.   On: 05/18/2015 12:37   Dg Abd 1 View  05/18/2015  CLINICAL DATA:  Lower abdominal pain,  constipation. EXAM: ABDOMEN - 1 VIEW COMPARISON:  None. FINDINGS: The bowel gas pattern is normal. Phleboliths are noted in the pelvis. Inferior tip of liver extends iliac wing suggesting hepatomegaly. IMPRESSION: No evidence of bowel obstruction or ileus.  Possible hepatomegaly. Electronically Signed   By: Marijo Conception, M.D.   On: 05/18/2015 11:20   Ct Chest Wo Contrast  05/18/2015  CLINICAL DATA:  65 year old male with abdominal pelvic pain, nausea and right lower lung opacity identified on recent chest radiograph. EXAM: CT CHEST, ABDOMEN AND PELVIS WITHOUT CONTRAST TECHNIQUE: Multidetector CT imaging of the chest, abdomen and pelvis was performed  following the standard protocol without IV contrast. COMPARISON:  05/18/2015 and prior radiographs.  07/26/2014 chest CT. FINDINGS: CT CHEST Mediastinum/Nodes: Multiple enlarged mediastinal and right hilar lymph nodes are identified. Index nodes include the following: A 3.8 x 3.8 cm right peritracheal node (image 25). A 3.4 x 4.6 cm sub carinal node (image 33). A 1.7 cm right hilar node (image 32). There is no evidence of pericardial effusion. Ectasia of the ascending aorta measuring 3.5 cm noted. Lungs/Pleura: An ill-defined 7 x 9 cm mass within the central right middle/ lower lobes identified extending to the right hilum. Nodular interstitial thickening within the right lower lobe identified. Ill-defined ground-glass and nodular opacities within the right upper lobe are identified. A small right pleural effusion is noted with pleural nodularity. 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 There is no evidence of left pleural effusion. Emphysema is identified. Musculoskeletal: No acute or suspicious abnormalities identified. CT ABDOMEN AND PELVIS Please note that parenchymal abnormalities may be missed without intravenous contrast. Hepatobiliary: Multiple faint hypodense lesions within the liver are noted. The largest lesion measures 1.5 cm and anterior liver (image 57). The gallbladder is unremarkable. There is no evidence of biliary dilatation. Pancreas: Unremarkable Spleen: Unremarkable Adrenals/Urinary Tract: Mild bilateral renal atrophy noted. No definite mass, hydronephrosis urinary calculi noted. The bladder and adrenal glands are unremarkable. Stomach/Bowel: Colonic diverticulosis noted without diverticulitis. No evidence bowel obstruction or definite focal bowel wall thickening. Vascular/Lymphatic: Enlarged retroperitoneal and upper abdominal lymph nodes identified. An index 1.8 x 2.1 cm right retroperitoneal node (image 68) noted. Aortic atherosclerotic  calcifications noted without aneurysm. Reproductive: Mild prostate enlargement noted. Other: No free fluid, pneumoperitoneum or focal collection. Musculoskeletal: No acute or suspicious abnormalities identified. IMPRESSION: 7 x 9 cm central right middle lobe/lower lobe mass compatible with malignancy. Right lower lobe nodular interstitial thickening,, right upper lobe nodules, right pleural nodules with small right pleural effusion, enlarged mediastinal, right hilar and upper abdominal lymph nodes, and ill-defined hepatic lesions- compatible with metastatic disease. Electronically Signed   By: Margarette Canada M.D.   On: 05/18/2015 14:51   US Thoracentesis Asp Pleural Space W/img Guide  05/19/2015  INDICATION: Newly diagnosed right lung mass with associated pleural effusion. Request diagnostic and therapeutic thoracentesis EXAM: ULTRASOUND GUIDED RIGHT THORACENTESIS MEDICATIONS: None. COMPLICATIONS: None immediate. PROCEDURE: An ultrasound guided thoracentesis was thoroughly discussed with the patient and questions answered. The benefits, risks, alternatives and complications were also discussed. The patient understands and wishes to proceed with the procedure. Written consent was obtained. Ultrasound was performed to localize and mark an adequate pocket of fluid in the right chest. The area was then prepped and draped in the normal sterile fashion. 1% Lidocaine was used for local anesthesia. Under ultrasound guidance a Safe-T-Centesis catheter was introduced. Thoracentesis was performed. The catheter was removed and a dressing applied. FINDINGS: A total  of approximately 830 mL of hazy, dark yellow fluid was removed. Samples were sent to the laboratory as requested by the clinical team. Limited ultrasound imaging of the liver during this procedure finds numerous lesions suspicious for metastatic disease. IMPRESSION: Successful ultrasound guided right thoracentesis yielding 830 mL of pleural fluid. Read by: Ascencion Dike PA-C Electronically Signed   By: Lucrezia Europe M.D.   On: 05/19/2015 14:26    CBC  Recent Labs Lab 05/29/15 1313 05/30/15 0341 05/31/15 0340  WBC 16.0* 15.6* 17.5*  HGB 11.3* 9.9* 9.7*  HCT 32.5* 28.3* 27.8*  PLT 78 Large platelets present* 72* 76*  MCV 91.3 90.7 90.8  MCH 31.7 31.7 31.7  MCHC 34.8 35.0 34.9  RDW 15.6* 15.7* 16.1*  LYMPHSABS 2.0  --   --   MONOABS 1.5*  --   --   EOSABS 0.1  --   --   BASOSABS 0.2*  --   --     Chemistries   Recent Labs Lab 05/29/15 1313 05/30/15 0341 05/31/15 0340  NA 135* 136 138  K 4.8 4.7 4.9  CL  --  99* 104  CO2 21* 20* 18*  GLUCOSE 85 100* 141*  BUN 76.9* 83* 99*  CREATININE 1.9* 1.80* 1.77*  CALCIUM 10.2 9.5 8.9  AST 610* 526* 637*  ALT 139* 120* 134*  ALKPHOS 923* 736* 775*  BILITOT 3.73* 4.0* 4.7*   ------------------------------------------------------------------------------------------------------------------ estimated creatinine clearance is 41.1 mL/min (by C-G formula based on Cr of 1.77). ------------------------------------------------------------------------------------------------------------------ No results for input(s): HGBA1C in the last 72 hours. ------------------------------------------------------------------------------------------------------------------ No results for input(s): CHOL, HDL, LDLCALC, TRIG, CHOLHDL, LDLDIRECT in the last 72 hours. ------------------------------------------------------------------------------------------------------------------ No results for input(s): TSH, T4TOTAL, T3FREE, THYROIDAB in the last 72 hours.  Invalid input(s): FREET3 ------------------------------------------------------------------------------------------------------------------ No results for input(s): VITAMINB12, FOLATE, FERRITIN, TIBC, IRON, RETICCTPCT in the last 72 hours.  Coagulation profile No results for input(s): INR, PROTIME in the last 168 hours.  No results for input(s): DDIMER in the  last 72 hours.  Cardiac Enzymes No results for input(s): CKMB, TROPONINI, MYOGLOBIN in the last 168 hours.  Invalid input(s): CK ------------------------------------------------------------------------------------------------------------------ Invalid input(s): POCBNP    Shane Vasquez D.O. on 05/31/2015 at 12:27 PM  Between 7am to 7pm - Pager - [phone]  After 7pm go to [email or web address] - password [email or web address]  And look for the night coverage person covering for me after hours  [facility] Office  [phone]

## 2015-05-31 NOTE — Progress Notes (Signed)
Patient started having non productive cough and sounded congested and with rhonchi, o2 sat was was 78% on 2l/[place], o2 increased to 4l/[place], o2 sat up to 82 %. Md notified, duoneb tx ordered.Respiratory therapist at bedside , o2 increased to 6/[place], o2 sat remained low, rapid response nurse called. O2 administered via VM 55%, o2 sat maintained at 91 to 92 %, Md orderd lasix 40 mgs. IV fluid placed on KVo. Patient appears more comfortable after treatments.Wife at bedside.

## 2015-05-31 NOTE — Progress Notes (Signed)
Tolerated chemo well today, iv site remained with very good blood return,no pain or swelling on site.

## 2015-05-31 NOTE — Progress Notes (Signed)
Chem dose and calculations checked with Delora Fuel RN

## 2015-05-31 NOTE — Significant Event (Signed)
Rapid Response Event Note  Overview:      Initial Focused Assessment:Pt lying in bed, resp rate 30's, lungs with rhonchi bil, congested non productive cough.  O2 sats 86% on 6l [place].  Dr Ree Kida had been called by bedside RN.     Interventions: Placed on 55% VM, given '40mg'$  IV Lasix, IV dec to KVO.  BP  98/63 (74) HR 85 RR 30.  Pt was given MS '2mg'$  earlier.  Event Summary: 6:45 pm pt appears more comfortable, lungs cont to have rhonchi, however pt says breathing is better.  BP 104/64 (76) HR 87  RR 28, sat 92%.  Will cont to watch pt closely.   at      at          [place], Jae Dire

## 2015-06-01 DIAGNOSIS — E8770 Fluid overload, unspecified: Secondary | ICD-10-CM

## 2015-06-01 LAB — COMPREHENSIVE METABOLIC PANEL
ALBUMIN: 2.1 g/dL — AB (ref 3.5–5.0)
ALT: 129 U/L — ABNORMAL HIGH (ref 17–63)
ANION GAP: 13 (ref 5–15)
AST: 609 U/L — ABNORMAL HIGH (ref 15–41)
Alkaline Phosphatase: 779 U/L — ABNORMAL HIGH (ref 38–126)
BILIRUBIN TOTAL: 4.1 mg/dL — AB (ref 0.3–1.2)
BUN: 98 mg/dL — ABNORMAL HIGH (ref 6–20)
CHLORIDE: 107 mmol/L (ref 101–111)
CO2: 19 mmol/L — AB (ref 22–32)
Calcium: 8.6 mg/dL — ABNORMAL LOW (ref 8.9–10.3)
Creatinine, Ser: 1.58 mg/dL — ABNORMAL HIGH (ref 0.61–1.24)
GFR calc Af Amer: 51 mL/min — ABNORMAL LOW (ref >60)
GFR calc non Af Amer: 44 mL/min — ABNORMAL LOW (ref >60)
GLUCOSE: 121 mg/dL — AB (ref 65–99)
POTASSIUM: 4.9 mmol/L (ref 3.5–5.1)
SODIUM: 139 mmol/L (ref 135–145)
TOTAL PROTEIN: 5.5 g/dL — AB (ref 6.5–8.1)

## 2015-06-01 LAB — CBC
HCT: 28.2 % — ABNORMAL LOW (ref 39.0–52.0)
HEMOGLOBIN: 9.6 g/dL — AB (ref 13.0–17.0)
MCH: 30.7 pg (ref 26.0–34.0)
MCHC: 34 g/dL (ref 30.0–36.0)
MCV: 90.1 fL (ref 78.0–100.0)
Platelets: 69 10*3/uL — ABNORMAL LOW (ref 150–400)
RBC: 3.13 MIL/uL — AB (ref 4.22–5.81)
RDW: 16.4 % — ABNORMAL HIGH (ref 11.5–15.5)
WBC: 16.3 10*3/uL — ABNORMAL HIGH (ref 4.0–10.5)

## 2015-06-01 LAB — URIC ACID: Uric Acid, Serum: 6.9 mg/dL (ref 4.4–7.6)

## 2015-06-01 MED ORDER — SODIUM CHLORIDE 0.9 % IV SOLN
10.0000 mg | Freq: Once | INTRAVENOUS | Status: AC
  Administered 2015-06-01: 10 mg via INTRAVENOUS
  Filled 2015-06-01: qty 1

## 2015-06-01 MED ORDER — ETOPOSIDE CHEMO INJECTION 1 GM/50ML
60.0000 mg/m2 | Freq: Once | INTRAVENOUS | Status: AC
  Administered 2015-06-01: 110 mg via INTRAVENOUS
  Filled 2015-06-01: qty 5.5

## 2015-06-01 MED ORDER — HOT PACK MISC ONCOLOGY
1.0000 | Freq: Once | Status: AC | PRN
Start: 2015-06-01 — End: 2015-06-01
  Filled 2015-06-01: qty 1

## 2015-06-01 MED ORDER — BOOST PLUS PO LIQD
237.0000 mL | Freq: Two times a day (BID) | ORAL | Status: DC
  Administered 2015-06-02 – 2015-06-03 (×3): 237 mL via ORAL
  Filled 2015-06-01 (×6): qty 237

## 2015-06-01 NOTE — Progress Notes (Signed)
Shane Vasquez   DOB:Dec 03, 1950   QB#:[id number]    Subjective: The patient tolerated treatment poorly yesterday with evidence of fluid overload from aggressive fluid resuscitation. He responded well to nebulizer treatment, oxygen and diuretic therapy. He denies nausea. Hiccups had resolved. MRI brain is pending  Objective:  Filed Vitals:   05/31/15 2021 06/01/15 0512  BP: 100/51 100/61  Pulse: 82 81  Temp: 97.1 F (36.2 C) 97.4 F (36.3 C)  Resp: 20 20     Intake/Output Summary (Last 24 hours) at 06/01/15 0756 Last data filed at 05/31/15 2026  Gross per 24 hour  Intake    120 ml  Output   1250 ml  Net  -1130 ml    GENERAL:alert, no distress and comfortable SKIN: skin color is pale, texture, turgor are normal, no rashes or significant lesions EYES: normal, Conjunctiva are pink and non-injected, sclera clear OROPHARYNX:no exudate, no erythema and lips, buccal mucosa, and tongue normal  NECK: supple, thyroid normal size, non-tender, without nodularity LYMPH:  no palpable lymphadenopathy in the cervical, axillary or inguinal LUNGS: clear to auscultation and percussion with normal breathing effort HEART: regular rate & rhythm and no murmurs and no lower extremity edema ABDOMEN:abdomen soft, mildly distended, non-tender and normal bowel sounds Musculoskeletal:no cyanosis of digits and no clubbing  NEURO: alert & oriented x 3 with fluent speech, no focal motor/sensory deficits   Labs:  Lab Results  Component Value Date   WBC 16.3* 06/01/2015   HGB 9.6* 06/01/2015   HCT 28.2* 06/01/2015   MCV 90.1 06/01/2015   PLT 69* 06/01/2015   NEUTROABS 12.2* 05/29/2015    Lab Results  Component Value Date   NA 139 06/01/2015   K 4.9 06/01/2015   CL 107 06/01/2015   CO2 19* 06/01/2015   Assessment & Plan:   Extensive stage small cell lung cancer with known liver metastasis He is receiving palliative chemotherapy. Continue treatment and aggressive supportive care  Elevated liver  enzymes due to metastatic disease He has persistent elevated liver enzymes No signs of encephalopathy  Pancytopenia with anemia and thrombocytopenia Could be related to extensive disease He is not symptomatic. Monitor carefully. No contraindication to anticoagulation therapy as well as platelet count is greater than 50,000  Hiccups Could be related to liver metastasis versus CNS metastasis MRI brain is pending  Chronic kidney disease stage III Cause unknown. Continue IVF  Significant hyperuricemia, resolved Could be due to spontaneous signs of tumor lysis I recommend 1 dose of Rasburicase on 05/31/15 Recheck tomorrow and monitor carefully  Volume overload This was related to aggressive IV fluid resuscitation Continue gentle hydration and 50 mL an hour He received oxygen therapy.  Protein calorie malnutrition, moderate Nutritional supplements as tolerated  CODE STATUS DO NOT RESUSCITATE  Discharge planning Note ready for discharge until next week  [facility], [place], MD 06/01/2015  7:56 AM

## 2015-06-01 NOTE — Progress Notes (Signed)
[facility]                                                                              Patient Demographics  Shane Vasquez, is a 65 y.o. male, DOB - 12-Apr-1950, ELF:[id number]  Admit date - 05/29/2015   Admitting Physician Albertine Patricia, MD  Outpatient Primary MD for the patient is [name], MD  LOS - 3   No chief complaint on file.     HPI on 05/29/2015  Shane Vasquez is a 65 y.o. 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 Worthy Flank office (oncology) today and was found to have fatigue as well as weakness with poor oral intake and weight loss. Patient has also complained of having the hiccups over the last several days. He also complains of back pain as well as abdominal pain and nausea. Patient denies any headache, visual changes, dizziness. Patient does have a history of smoking but quit approximate 5 years ago. At this time, patient states he is very sleepy. He denies any chest pain, shortness of breath, problems urinating. Patient directly admitted from Dr. Worthy Flank office for palliative chemotherapy. In the office, labs were drawn, patient found to have leukocytosis, thrombocytopenia.   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 Started on chemotherapy- 2/3 treatments -MRI brain without contrast pending   Dyspnea  -Secondary to volume overload. (no history of CHF) -Resolved -patient was receiving aggressive IV hydration s/p chemo  -Was given nebs, O2, and lasix -Continue to monitor closely  Acute kidney injury vs Chronic kidney disease, stage III -Secondary dehydration and poor oral intake -Creatinine was normal in 2016, however, earlier April 2017, creatinine 1.6 -Upon admission, Creatinine 1.8 -Creatinine currently 1.58 -Hold HCTZ -Continue IV fluids and continue  to monitor BMP  Abdominal pain with nausea/ elevated liver enzymes -Continue pain control and antiemetics as needed -LFTs elevated, likely secondary to liver metastasis -Continue to monitor CMP  Thrombocytopenia -Likely secondary to the above -Currently no evidence of bleeding, continue to monitor CBC  Leukocytosis -Possibly reactive vs infectious -No complaints of SOB or cough, or problems urinating. -Will continue to Monitor  Hiccups -Continue Thorazine 25 mg every 8 hours when necessary  Hyperuricemia -Improved -Possibly due to tumor lysis -Given rasburicase by oncology -Continue to monitor   Essential hypertension -Pressure has been soft. Will continue to monitor closely -Hold HCTZ  Depression -Continue Celexa  Severe malnutrition/FTT -Likely secondary to the above -nutrition consulted  Code Status: DNR  Family Communication: Wife at bedside  Disposition Plan: Admitted. Pending MRI brain and continue chemo  Time Spent in minutes   30 minutes  Procedures  None  Consults   Oncology  DVT Prophylaxis  SCDs  Lab Results  Component Value Date   PLT 69* 06/01/2015    Medications  Scheduled Meds: . citalopram  40 mg Oral Daily  . dexamethasone (DECADRON) IVPB [facility]  10 mg Intravenous Once  . docusate sodium  100 mg Oral Daily  . etoposide  60 mg/m2 (Treatment Plan Actual) Intravenous Once  .  LORazepam  1 mg Intravenous Once   Continuous Infusions: . sodium chloride 50 mL/hr at 06/01/15 1101   PRN Meds:.acetaminophen **OR** acetaminophen, alteplase, chlorproMAZINE (THORAZINE) injection, heparin lock flush, heparin lock flush, Hot Pack, ipratropium-albuterol, morphine injection, ondansetron **OR** ondansetron (ZOFRAN) IV, oxyCODONE, polyethylene glycol, senna, sodium chloride flush, sodium chloride flush  Antibiotics    Anti-infectives    None      Subjective:   Shane Vasquez seen and examined today.  Patient feels hiccups have resolved. Became  short of breath yesterday evening, but feels his breathing is back to normal today. Denies cough.  Patient denies dizziness, chest pain, abdominal pain.    Objective:   Filed Vitals:   05/31/15 1315 05/31/15 1815 05/31/15 2021 06/01/15 0512  BP: 108/53 96/63 100/51 100/61  Pulse: 80 85 82 81  Temp: 97.5 F (36.4 C)  97.1 F (36.2 C) 97.4 F (36.3 C)  TempSrc: Oral  Axillary Axillary  Resp: '16 26 20 20  '$ Height:      Weight:    71.714 kg (158 lb 1.6 oz)  SpO2: 91% 91% 94% 95%    Wt Readings from Last 3 Encounters:  06/01/15 71.714 kg (158 lb 1.6 oz)  05/29/15 71.85 kg (158 lb 6.4 oz)  05/20/15 73.483 kg (162 lb)     Intake/Output Summary (Last 24 hours) at 06/01/15 1151 Last data filed at 05/31/15 2026  Gross per 24 hour  Intake     60 ml  Output   1200 ml  Net  -1140 ml    Exam (no change)  General: Well developed, thin, NAD  HEENT: NCAT, mucous membranes moist  Cardiovascular: S1 S2 auscultated, no murmurs, RRR  Respiratory: Clear to auscultation bilaterally  Abdomen: Soft, nontender, nondistended, + bowel sounds  Extremities: warm dry without cyanosis clubbing or edema  Neuro: AAOx3, drowsy, nonfocal  Skin: Without rashes exudates or nodules  Psych: Normal affect and demeanor, pleasant  Data Review   Micro Results Recent Results (from the past 240 hour(s))  TECHNOLOGIST REVIEW     Status: None   Collection Time: 05/29/15  1:13 PM  Result Value Ref Range Status   Technologist Review 5% myelocytes  Final    Radiology Reports Ct Abdomen Pelvis Wo Contrast  05/18/2015  CLINICAL DATA:  65 year old male with abdominal pelvic pain, nausea and right lower lung opacity identified on recent chest radiograph. EXAM: CT CHEST, ABDOMEN AND PELVIS WITHOUT CONTRAST TECHNIQUE: Multidetector CT imaging of the chest, abdomen and pelvis was performed following the standard protocol without IV contrast. COMPARISON:  05/18/2015 and prior radiographs.  07/26/2014 chest CT.  FINDINGS: CT CHEST Mediastinum/Nodes: Multiple enlarged mediastinal and right hilar lymph nodes are identified. Index nodes include the following: A 3.8 x 3.8 cm right peritracheal node (image 25). A 3.4 x 4.6 cm sub carinal node (image 33). A 1.7 cm right hilar node (image 32). There is no evidence of pericardial effusion. Ectasia of the ascending aorta measuring 3.5 cm noted. Lungs/Pleura: An ill-defined 7 x 9 cm mass within the central right middle/ lower lobes identified extending to the right hilum. Nodular interstitial thickening within the right lower lobe identified. Ill-defined ground-glass and nodular opacities within the right upper lobe are identified. A small right pleural effusion is noted with pleural nodularity. 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 There is no evidence of left pleural effusion. Emphysema is identified. Musculoskeletal: No acute or suspicious abnormalities identified. CT ABDOMEN AND PELVIS Please note that  parenchymal abnormalities may be missed without intravenous contrast. Hepatobiliary: Multiple faint hypodense lesions within the liver are noted. The largest lesion measures 1.5 cm and anterior liver (image 57). The gallbladder is unremarkable. There is no evidence of biliary dilatation. Pancreas: Unremarkable Spleen: Unremarkable Adrenals/Urinary Tract: Mild bilateral renal atrophy noted. No definite mass, hydronephrosis urinary calculi noted. The bladder and adrenal glands are unremarkable. Stomach/Bowel: Colonic diverticulosis noted without diverticulitis. No evidence bowel obstruction or definite focal bowel wall thickening. Vascular/Lymphatic: Enlarged retroperitoneal and upper abdominal lymph nodes identified. An index 1.8 x 2.1 cm right retroperitoneal node (image 68) noted. Aortic atherosclerotic calcifications noted without aneurysm. Reproductive: Mild prostate enlargement noted. Other: No free fluid,  pneumoperitoneum or focal collection. Musculoskeletal: No acute or suspicious abnormalities identified. IMPRESSION: 7 x 9 cm central right middle lobe/lower lobe mass compatible with malignancy. Right lower lobe nodular interstitial thickening,, right upper lobe nodules, right pleural nodules with small right pleural effusion, enlarged mediastinal, right hilar and upper abdominal lymph nodes, and ill-defined hepatic lesions- compatible with metastatic disease. Electronically Signed   By: Margarette Canada M.D.   On: 05/18/2015 14:51   Dg Chest 1 View  05/19/2015  CLINICAL DATA:  Status post right-sided thoracentesis EXAM: CHEST 1 VIEW COMPARISON:  May 18, 2015 chest CT and chest radiograph FINDINGS: No pneumothorax. There has been near complete resolution of pleural effusion on the right. There is persistent right middle mass with adjacent lobe airspace consolidation. There is scarring and atelectatic change in the left base. The heart size and pulmonary vascularity are within normal limits. Multiple foci of adenopathy are noted throughout the mediastinum superiorly and on the right. IMPRESSION: No pneumothorax. Essentially complete resolution of pleural effusion on the right. Mass with airspace consolidation remains on the right, unchanged. Extensive adenopathy present. Scarring and atelectatic change are noted in the left base. Electronically Signed   By: Lowella Grip III M.D.   On: 05/19/2015 14:03   Dg Chest 2 View  05/29/2015  CLINICAL DATA:  COPD, hypertension, recent diagnosis of small cell lung cancer with metastasis, presented today with fatigue, weakness, weight loss and poor oral intake, hiccups, back pain, abdominal pain, nausea EXAM: CHEST  2 VIEW COMPARISON:  05/19/2015 FINDINGS: Normal heart size and pulmonary vascularity. Mediastinal enlargement corresponding to extensive adenopathy on prior CT. Increased opacity at RIGHT lung base consistent with known tumor, increased atelectasis and pleural  effusion. Underlying emphysematous and bronchitic changes. Subsegmental atelectasis and scarring at LEFT base persists. No pneumothorax. Bones demineralized. IMPRESSION: COPD changes with increased RIGHT basilar opacity corresponding to known tumor which increased atelectasis and pleural effusion. Mediastinal adenopathy. Electronically Signed   By: Lavonia Dana M.D.   On: 05/29/2015 19:59   Dg Chest 2 View  05/18/2015  CLINICAL DATA:  Shortness of breath and dry cough for 1-1/2 weeks. Former smoker for 50 years. EXAM: CHEST  2 VIEW COMPARISON:  Chest x-rays dated 01/02/2015 and 09/19/2014. FINDINGS: New ill-defined airspace opacity within the anterior aspect of the right lower lobe, extending to the right hilum. Chronic scarring/ atelectasis noted at the left lung base. Lungs are hyperexpanded indicating some degree of COPD. Coarse interstitial markings are seen within both lungs which is likely a related chronic interstitial lung disease. Heart size is upper normal. IMPRESSION: New dense ill-defined airspace opacity within the right lower lobe, extending to the right hilum. Additional vague density at the right hilum and within the overlying medial suprahilar region. This could represent pneumonia or airspace collapse related to a central  obstructing mass. Recommend chest CT with contrast for further characterization. Hyperexpanded lungs indicating COPD/emphysema. Suspect associated chronic interstitial lung disease bilaterally. Electronically Signed   By: Franki Cabot M.D.   On: 05/18/2015 12:37   Dg Abd 1 View  05/18/2015  CLINICAL DATA:  Lower abdominal pain, constipation. EXAM: ABDOMEN - 1 VIEW COMPARISON:  None. FINDINGS: The bowel gas pattern is normal. Phleboliths are noted in the pelvis. Inferior tip of liver extends iliac wing suggesting hepatomegaly. IMPRESSION: No evidence of bowel obstruction or ileus.  Possible hepatomegaly. Electronically Signed   By: Marijo Conception, M.D.   On: 05/18/2015 11:20     Ct Chest Wo Contrast  05/18/2015  CLINICAL DATA:  65 year old male with abdominal pelvic pain, nausea and right lower lung opacity identified on recent chest radiograph. EXAM: CT CHEST, ABDOMEN AND PELVIS WITHOUT CONTRAST TECHNIQUE: Multidetector CT imaging of the chest, abdomen and pelvis was performed following the standard protocol without IV contrast. COMPARISON:  05/18/2015 and prior radiographs.  07/26/2014 chest CT. FINDINGS: CT CHEST Mediastinum/Nodes: Multiple enlarged mediastinal and right hilar lymph nodes are identified. Index nodes include the following: A 3.8 x 3.8 cm right peritracheal node (image 25). A 3.4 x 4.6 cm sub carinal node (image 33). A 1.7 cm right hilar node (image 32). There is no evidence of pericardial effusion. Ectasia of the ascending aorta measuring 3.5 cm noted. Lungs/Pleura: An ill-defined 7 x 9 cm mass within the central right middle/ lower lobes identified extending to the right hilum. Nodular interstitial thickening within the right lower lobe identified. Ill-defined ground-glass and nodular opacities within the right upper lobe are identified. A small right pleural effusion is noted with pleural nodularity. 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 There is no evidence of left pleural effusion. Emphysema is identified. Musculoskeletal: No acute or suspicious abnormalities identified. CT ABDOMEN AND PELVIS Please note that parenchymal abnormalities may be missed without intravenous contrast. Hepatobiliary: Multiple faint hypodense lesions within the liver are noted. The largest lesion measures 1.5 cm and anterior liver (image 57). The gallbladder is unremarkable. There is no evidence of biliary dilatation. Pancreas: Unremarkable Spleen: Unremarkable Adrenals/Urinary Tract: Mild bilateral renal atrophy noted. No definite mass, hydronephrosis urinary calculi noted. The bladder and adrenal glands are unremarkable.  Stomach/Bowel: Colonic diverticulosis noted without diverticulitis. No evidence bowel obstruction or definite focal bowel wall thickening. Vascular/Lymphatic: Enlarged retroperitoneal and upper abdominal lymph nodes identified. An index 1.8 x 2.1 cm right retroperitoneal node (image 68) noted. Aortic atherosclerotic calcifications noted without aneurysm. Reproductive: Mild prostate enlargement noted. Other: No free fluid, pneumoperitoneum or focal collection. Musculoskeletal: No acute or suspicious abnormalities identified. IMPRESSION: 7 x 9 cm central right middle lobe/lower lobe mass compatible with malignancy. Right lower lobe nodular interstitial thickening,, right upper lobe nodules, right pleural nodules with small right pleural effusion, enlarged mediastinal, right hilar and upper abdominal lymph nodes, and ill-defined hepatic lesions- compatible with metastatic disease. Electronically Signed   By: Margarette Canada M.D.   On: 05/18/2015 14:51   US Thoracentesis Asp Pleural Space W/img Guide  05/19/2015  INDICATION: Newly diagnosed right lung mass with associated pleural effusion. Request diagnostic and therapeutic thoracentesis EXAM: ULTRASOUND GUIDED RIGHT THORACENTESIS MEDICATIONS: None. COMPLICATIONS: None immediate. PROCEDURE: An ultrasound guided thoracentesis was thoroughly discussed with the patient and questions answered. The benefits, risks, alternatives and complications were also discussed. The patient understands and wishes to proceed with the procedure. Written consent was obtained. Ultrasound was performed to localize and  mark an adequate pocket of fluid in the right chest. The area was then prepped and draped in the normal sterile fashion. 1% Lidocaine was used for local anesthesia. Under ultrasound guidance a Safe-T-Centesis catheter was introduced. Thoracentesis was performed. The catheter was removed and a dressing applied. FINDINGS: A total of approximately 830 mL of hazy, dark yellow fluid  was removed. Samples were sent to the laboratory as requested by the clinical team. Limited ultrasound imaging of the liver during this procedure finds numerous lesions suspicious for metastatic disease. IMPRESSION: Successful ultrasound guided right thoracentesis yielding 830 mL of pleural fluid. Read by: Ascencion Dike PA-C Electronically Signed   By: Lucrezia Europe M.D.   On: 05/19/2015 14:26    CBC  Recent Labs Lab 05/29/15 1313 05/30/15 0341 05/31/15 0340 06/01/15 0410  WBC 16.0* 15.6* 17.5* 16.3*  HGB 11.3* 9.9* 9.7* 9.6*  HCT 32.5* 28.3* 27.8* 28.2*  PLT 78 Large platelets present* 72* 76* 69*  MCV 91.3 90.7 90.8 90.1  MCH 31.7 31.7 31.7 30.7  MCHC 34.8 35.0 34.9 34.0  RDW 15.6* 15.7* 16.1* 16.4*  LYMPHSABS 2.0  --   --   --   MONOABS 1.5*  --   --   --   EOSABS 0.1  --   --   --   BASOSABS 0.2*  --   --   --     Chemistries   Recent Labs Lab 05/29/15 1313 05/30/15 0341 05/31/15 0340 06/01/15 0410  NA 135* 136 138 139  K 4.8 4.7 4.9 4.9  CL  --  99* 104 107  CO2 21* 20* 18* 19*  GLUCOSE 85 100* 141* 121*  BUN 76.9* 83* 99* 98*  CREATININE 1.9* 1.80* 1.77* 1.58*  CALCIUM 10.2 9.5 8.9 8.6*  AST 610* 526* 637* 609*  ALT 139* 120* 134* 129*  ALKPHOS 923* 736* 775* 779*  BILITOT 3.73* 4.0* 4.7* 4.1*   ------------------------------------------------------------------------------------------------------------------ estimated creatinine clearance is 46 mL/min (by C-G formula based on Cr of 1.58). ------------------------------------------------------------------------------------------------------------------ No results for input(s): HGBA1C in the last 72 hours. ------------------------------------------------------------------------------------------------------------------ No results for input(s): CHOL, HDL, LDLCALC, TRIG, CHOLHDL, LDLDIRECT in the last 72  hours. ------------------------------------------------------------------------------------------------------------------ No results for input(s): TSH, T4TOTAL, T3FREE, THYROIDAB in the last 72 hours.  Invalid input(s): FREET3 ------------------------------------------------------------------------------------------------------------------ No results for input(s): VITAMINB12, FOLATE, FERRITIN, TIBC, IRON, RETICCTPCT in the last 72 hours.  Coagulation profile No results for input(s): INR, PROTIME in the last 168 hours.  No results for input(s): DDIMER in the last 72 hours.  Cardiac Enzymes No results for input(s): CKMB, TROPONINI, MYOGLOBIN in the last 168 hours.  Invalid input(s): CK ------------------------------------------------------------------------------------------------------------------ Invalid input(s): POCBNP    Yandriel Boening D.O. on 06/01/2015 at 11:51 AM  Between 7am to 7pm - Pager - [phone]  After 7pm go to [email or web address] - password [email or web address]  And look for the night coverage person covering for me after hours  [facility] Office  [phone]

## 2015-06-01 NOTE — Progress Notes (Signed)
Nutrition Follow-up  DOCUMENTATION CODES:   Severe malnutrition in context of acute illness/injury  INTERVENTION:   -Provide Boost Plus TID, each supplement provides 360 kcal and 14 grams of protein. -Encouraged intake of frequent small snacks. Family agreed to provide as pt with particular food/brand preferences. -Encourage PO intake -RD to continue to monitor  NUTRITION DIAGNOSIS:   Increased nutrient needs related to catabolic illness, cancer and cancer related treatments as evidenced by estimated needs.  Ongoing.  GOAL:   Patient will meet greater than or equal to 90% of their needs  Not meeting.  MONITOR:   PO intake, Supplement acceptance, Labs, Weight trends, I & O's  ASSESSMENT:   65 y.o. male with a history of COPD, hypertension recently diagnosed small cell lung cancer with metastasis, that presented to Dr. Worthy Flank office (oncology) today and was found to have fatigue as well as weakness with poor oral intake and weight loss. Patient has also complained of having the hiccups over the last several days. He also complains of back pain as well as abdominal pain and nausea. Patient denies any headache, visual changes, dizziness. Patient does have a history of smoking but quit approximate 5 years ago. Patient directly admitted from Dr. Worthy Flank office for palliative chemotherapy.  Patient asleep with wife at bedside. History gathered from wife. Pt's wife states that pt has always been a particular eater and eats very slowly but she has noticed he eats more slowly now since treatments began. Denies any swallowing issues. PTA pt was eating a few bites of soups and was drinking fluids as much as he could. He is unable to eat much at one time. For breakfast today, pt ate 25% of an ham and cheese omelette, 50% of his fruit and 50% of a [employer] coffee in which the pt's wife added extra amounts of sugar and cream. Pt's lunch tray has been delivered but pt's wife was waiting for  him to wake up to eat. She ordered him fish, green beans and chocolate pudding. Pt does not like Ensure supplements but has not tried Boost. RD to order a trial of Boost Plus supplements. Encouraged pt's family to bring snacks that pt prefers. Encouraged higher protein foods.    Per weight history, pt has lost 12 lb since November 2016 (7% wt loss x 5 months, insignificant for time frame). Nutrition focused physical exam shows no sign of depletion of muscle mass or body fat.  Labs reviewed. Medications reviewed.  Diet Order:  Diet regular Room service appropriate?: Yes; Fluid consistency:: Thin  Skin:  Reviewed, no issues  Last BM:  4/16  Height:   Ht Readings from Last 1 Encounters:  05/29/15 '5\' 9"'$  (1.753 m)    Weight:   Wt Readings from Last 1 Encounters:  06/01/15 158 lb 1.6 oz (71.714 kg)   Ideal Body Weight:  72.73 kg (kg)  BMI:  Body mass index is 23.34 kg/(m^2).  Estimated Nutritional Needs:   Kcal:  2150-2350 (30-33 kcal/kg)  Protein:  100-115 grams (1.4-1.6 grams/kg)  Fluid:  >/= 2 L/day  EDUCATION NEEDS:   Education needs addressed  Clayton Bibles, MS, RD, LDN Pager: [phone] After Hours Pager: [phone]

## 2015-06-01 NOTE — Progress Notes (Signed)
Chemo dosage and calculation checked with cindy Hughey RN,

## 2015-06-01 NOTE — Progress Notes (Signed)
Patient received 3rd dose of etoposide, tolerated well.

## 2015-06-02 DIAGNOSIS — R0600 Dyspnea, unspecified: Secondary | ICD-10-CM

## 2015-06-02 DIAGNOSIS — E790 Hyperuricemia without signs of inflammatory arthritis and tophaceous disease: Secondary | ICD-10-CM

## 2015-06-02 LAB — COMPREHENSIVE METABOLIC PANEL
ALBUMIN: 1.9 g/dL — AB (ref 3.5–5.0)
ALT: 121 U/L — AB (ref 17–63)
AST: 585 U/L — AB (ref 15–41)
Alkaline Phosphatase: 757 U/L — ABNORMAL HIGH (ref 38–126)
Anion gap: 11 (ref 5–15)
BUN: 100 mg/dL — AB (ref 6–20)
CHLORIDE: 107 mmol/L (ref 101–111)
CO2: 18 mmol/L — AB (ref 22–32)
CREATININE: 1.32 mg/dL — AB (ref 0.61–1.24)
Calcium: 8.1 mg/dL — ABNORMAL LOW (ref 8.9–10.3)
GFR calc Af Amer: >60 mL/min (ref >60)
GFR, EST NON AFRICAN AMERICAN: 55 mL/min — AB (ref >60)
GLUCOSE: 109 mg/dL — AB (ref 65–99)
Potassium: 5 mmol/L (ref 3.5–5.1)
SODIUM: 136 mmol/L (ref 135–145)
Total Bilirubin: 4.5 mg/dL — ABNORMAL HIGH (ref 0.3–1.2)
Total Protein: 5.1 g/dL — ABNORMAL LOW (ref 6.5–8.1)

## 2015-06-02 LAB — CBC
HEMATOCRIT: 26.2 % — AB (ref 39.0–52.0)
Hemoglobin: 9.1 g/dL — ABNORMAL LOW (ref 13.0–17.0)
MCH: 31.5 pg (ref 26.0–34.0)
MCHC: 34.7 g/dL (ref 30.0–36.0)
MCV: 90.7 fL (ref 78.0–100.0)
Platelets: 62 10*3/uL — ABNORMAL LOW (ref 150–400)
RBC: 2.89 MIL/uL — ABNORMAL LOW (ref 4.22–5.81)
RDW: 16.4 % — AB (ref 11.5–15.5)
WBC: 11.9 10*3/uL — ABNORMAL HIGH (ref 4.0–10.5)

## 2015-06-02 LAB — URIC ACID: Uric Acid, Serum: 4.4 mg/dL (ref 4.4–7.6)

## 2015-06-02 MED ORDER — BOOST / RESOURCE BREEZE PO LIQD
1.0000 | Freq: Three times a day (TID) | ORAL | Status: DC
  Administered 2015-06-03 (×2): 1 via ORAL

## 2015-06-02 NOTE — Evaluation (Signed)
Physical Therapy Evaluation Patient Details Name: Shane Vasquez MRN: [id number] DOB: Oct 13, 1950 Today's Date: 06/02/2015   History of Present Illness  Shane Vasquez is a 65 y.o. 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 Worthy Flank office (oncology) today and was found to have fatigue as well as weakness with poor oral intake and weight loss  Clinical Impression  The patient is very weak and somewhat lethargic. The wife is present and reports patient is home alone  For a few hours. Currently patient will require assistance 24/7. Pt admitted with above diagnosis. Pt currently with functional limitations due to the deficits listed below (see PT Problem List).  Pt will benefit from skilled PT to increase their independence and safety with mobility to allow discharge to the venue listed below.       Follow Up Recommendations SNF;Supervision/Assistance - 24 hour    Equipment Recommendations  Rolling walker with 5" wheels    Recommendations for Other Services       Precautions / Restrictions Precautions Precautions: Fall Precaution Comments: chemo precautions, on oxygen      Mobility  Bed Mobility Overal bed mobility: Needs Assistance Bed Mobility: Supine to Sit   Sidelying to sit: Mod assist;HOB elevated       General bed mobility comments: use of rail, extra time,  use of bed pad to slide to edge of bed, assist with trunk  Transfers Overall transfer level: Needs assistance Equipment used: Rolling walker (2 wheeled) Transfers: Sit to/from Stand Sit to Stand: Mod assist;+2 safety/equipment         General transfer comment: assist to rise, multimodal cues to turn towards the Recliner. stood x 1 minute for peri wash up due to condom cath leaking.   Ambulation/Gait                Stairs            Wheelchair Mobility    Modified Rankin (Stroke Patients Only)       Balance  Overall balance assessment: History of Falls;Needs assistance Sitting-balance support: Feet supported;Bilateral upper extremity supported Sitting balance-Leahy Scale: Fair     Standing balance support: During functional activity;Bilateral upper extremity supported Standing balance-Leahy Scale: Poor                               Pertinent Vitals/Pain Pain Assessment: No/denies pain    Home Living Family/patient expects to be discharged to:: Private residence Living Arrangements: Spouse/significant other Available Help at Discharge: Available PRN/intermittently;Family Type of Home: House Home Access: Stairs to enter Entrance Stairs-Rails: Right Entrance Stairs-Number of Steps: 2 Home Layout: One level Home Equipment: Grab bars - tub/shower;Tub bench Additional Comments: wife works at business they own    Prior Function Level of Independence: Independent         Comments: around the house, limited.     Hand Dominance        Extremity/Trunk Assessment   Upper Extremity Assessment: Generalized weakness           Lower Extremity Assessment: Generalized weakness;RLE deficits/detail;LLE deficits/detail RLE Deficits / Details: edema of the legs noted    Cervical / Trunk Assessment: Normal  Communication      Cognition Arousal/Alertness: Lethargic Behavior During Therapy: Flat affect Overall Cognitive Status: Impaired/Different from baseline Area of Impairment: Orientation Orientation Level: Time  General Comments      Exercises        Assessment/Plan    PT Assessment Patient needs continued PT services  PT Diagnosis Difficulty walking;Generalized weakness;Altered mental status   PT Problem List Decreased strength;Decreased activity tolerance;Decreased balance;Decreased mobility;Decreased knowledge of precautions;Decreased safety awareness;Decreased knowledge of use of DME  PT Treatment Interventions DME  instruction;Gait training;Functional mobility training;Therapeutic activities;Therapeutic exercise;Patient/family education   PT Goals (Current goals can be found in the Care Plan section) Acute Rehab PT Goals Patient Stated Goal: to be able to return home PT Goal Formulation: With patient/family Time For Goal Achievement: 06/16/15 Potential to Achieve Goals: Fair    Frequency Min 3X/week   Barriers to discharge Decreased caregiver support      Co-evaluation               End of Session Equipment Utilized During Treatment: Oxygen (3 liters) Activity Tolerance: Patient limited by fatigue Patient left: in chair;with call bell/phone within reach;with chair alarm set;with family/visitor present Nurse Communication: Mobility status         Time: 1400-1431 PT Time Calculation (min) (ACUTE ONLY): 31 min   Charges:   PT Evaluation $PT Eval Low Complexity: 1 Procedure PT Treatments $Therapeutic Activity: 8-22 mins   PT G Codes:        Marcelino Freestone PT [phone]  06/02/2015, 2:41 PM

## 2015-06-02 NOTE — Progress Notes (Signed)
Subjective: The patient is seen and examined today. His brother was at the bedside. He is feeling much better today compared to few days ago. He was eating his breakfast. He completed the first cycle of his systemic chemotherapy with reduced dose carboplatin and etoposide. He denied having any significant fever or chills. He has no nausea or vomiting. He is still too weak and laying in bed most of the time.  Objective: Vital signs in last 24 hours: Temp:  [97.6 F (36.4 C)-98.4 F (36.9 C)] 97.6 F (36.4 C) (04/17 0545) Pulse Rate:  [81-83] 81 (04/17 0545) Resp:  [18-20] 18 (04/17 0545) BP: (105-118)/(57-64) 118/64 mmHg (04/17 0545) SpO2:  [92 %-96 %] 96 % (04/17 0545)  Intake/Output from previous day: 04/16 0701 - 04/17 0700 In: 420 [P.O.:420] Out: 1320 [Urine:1320] Intake/Output this shift:    General appearance: alert, cooperative, fatigued and no distress Resp: diminished breath sounds RLL and dullness to percussion RLL Cardio: regular rate and rhythm, S1, S2 normal, no murmur, click, rub or gallop GI: soft, non-tender; bowel sounds normal; no masses,  no organomegaly Extremities: extremities normal, atraumatic, no cyanosis or edema  Lab Results:   Recent Labs  06/01/15 0410 06/02/15 0334  WBC 16.3* 11.9*  HGB 9.6* 9.1*  HCT 28.2* 26.2*  PLT 69* 62*   BMET  Recent Labs  06/01/15 0410 06/02/15 0334  NA 139 136  K 4.9 5.0  CL 107 107  CO2 19* 18*  GLUCOSE 121* 109*  BUN 98* 100*  CREATININE 1.58* 1.32*  CALCIUM 8.6* 8.1*    Studies/Results: No results found.  Medications: I have reviewed the patient's current medications.  CODE STATUS: No CODE BLUE  Assessment/Plan: 1) extensive stage small cell lung cancer: Daily +4 of the first cycle of his systemic chemotherapy with reduced dose carboplatin and etoposide. He tolerated the first cycle of his treatment well with no significant adverse effects. His CBC and comprehensive metabolic panel are stable. I  will continue to monitor the patient closely with repeat blood work on weekly basis. 2) respiratory distress: Secondary to small cell lung cancer. Slowly improving. 3) hyperuricemia: Improved after treatment with Rasburicase.  4) generalized weakness and fatigue: Secondary to small cell lung cancer. The patient is too weak to go home right now. He may benefit from discharge to a skilled nursing facility for 1-2 weeks until improvement of his general condition. 5) CODE STATUS: No CODE BLUE   LOS: 4 days    Meera Vasco K. 06/02/2015

## 2015-06-02 NOTE — Progress Notes (Signed)
[facility]                                                                              Patient Demographics  Shane Vasquez, is a 65 y.o. male, DOB - Dec 27, 1950, QIH:[id number]  Admit date - 05/29/2015   Admitting Physician Albertine Patricia, MD  Outpatient Primary MD for the patient is [name], MD  LOS - 4   No chief complaint on file.     HPI on 05/29/2015  Shane Vasquez is a 65 y.o. 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 Worthy Flank office (oncology) today and was found to have fatigue as well as weakness with poor oral intake and weight loss. Patient has also complained of having the hiccups over the last several days. He also complains of back pain as well as abdominal pain and nausea. Patient denies any headache, visual changes, dizziness. Patient does have a history of smoking but quit approximate 5 years ago. At this time, patient states he is very sleepy. He denies any chest pain, shortness of breath, problems urinating. Patient directly admitted from Dr. Worthy Flank office for palliative chemotherapy. In the office, labs were drawn, patient found to have leukocytosis, thrombocytopenia.   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 Started on chemotherapy , last treatment was 4/16 -MRI brain without contrast pending   Dyspnea  -Secondary to volume overload. (no history of CHF) -Resolved -patient was receiving aggressive IV hydration s/p chemo  -Was given nebs, O2, and lasix -Continue to monitor closely  Acute kidney injury vs Chronic kidney disease, stage III -Secondary dehydration and poor oral intake -Creatinine was normal in 2016, however, earlier April 2017, creatinine 1.6 -Upon admission, Creatinine 1.8 -Creatinine currently 1.3 -Hold HCTZ -Continue IV fluids and  continue to monitor BMP  Abdominal pain with nausea/ elevated liver enzymes -Continue pain control and antiemetics as needed -LFTs elevated, likely secondary to liver metastasis -Continue to monitor CMP  Thrombocytopenia -Likely secondary to the above -Currently no evidence of bleeding, continue to monitor CBC  Leukocytosis -Possibly reactive vs infectious -No complaints of SOB or cough, or problems urinating. -Will continue to Monitor  Hiccups -Continue Thorazine 25 mg every 8 hours when necessary  Hyperuricemia -Improved -Possibly due to tumor lysis -Given rasburicase by oncology -Continue to monitor   Essential hypertension -Pressure has been soft. Will continue to monitor closely -Hold HCTZ  Depression -Continue Celexa  Severe malnutrition/FTT -Likely secondary to the above -nutrition consulted  Code Status: DNR  Family Communication: Brother at bedside  Disposition Plan: Admitted. Pending MRI brain and PT evaluation  Time Spent in minutes   30 minutes  Procedures  None  Consults   Oncology  DVT Prophylaxis  SCDs  Lab Results  Component Value Date   PLT 62* 06/02/2015    Medications  Scheduled Meds: . citalopram  40 mg Oral Daily  . docusate sodium  100 mg Oral Daily  . feeding supplement  1 Container Oral TID BM  . lactose free nutrition  237 mL Oral BID BM  .  LORazepam  1 mg Intravenous Once   Continuous Infusions: . sodium chloride 50 mL/hr at 06/01/15 1101   PRN Meds:.acetaminophen **OR** acetaminophen, alteplase, chlorproMAZINE (THORAZINE) injection, heparin lock flush, heparin lock flush, ipratropium-albuterol, morphine injection, ondansetron **OR** ondansetron (ZOFRAN) IV, oxyCODONE, polyethylene glycol, senna, sodium chloride flush, sodium chloride flush  Antibiotics    Anti-infectives    None      Subjective:   Shane Vasquez seen and examined today. Denies cough.  Patient denies dizziness, chest pain, abdominal pain.     Objective:   Filed Vitals:   06/01/15 0512 06/01/15 1250 06/01/15 2139 06/02/15 0545  BP: 100/61 108/57 105/63 118/64  Pulse: 81 83 81 81  Temp: 97.4 F (36.3 C) 98.4 F (36.9 C) 97.8 F (36.6 C) 97.6 F (36.4 C)  TempSrc: Axillary Oral Oral Oral  Resp: '20 18 20 18  '$ Height:      Weight: 71.714 kg (158 lb 1.6 oz)     SpO2: 95% 93% 92% 96%    Wt Readings from Last 3 Encounters:  06/01/15 71.714 kg (158 lb 1.6 oz)  05/29/15 71.85 kg (158 lb 6.4 oz)  05/20/15 73.483 kg (162 lb)     Intake/Output Summary (Last 24 hours) at 06/02/15 1151 Last data filed at 06/01/15 2145  Gross per 24 hour  Intake    180 ml  Output   1200 ml  Net  -1020 ml    Exam (no change)  General: Well developed, thin, NAD  HEENT: NCAT, mucous membranes moist  Cardiovascular: S1 S2 auscultated, no murmurs, RRR  Respiratory: Clear to auscultation bilaterally  Abdomen: Soft, nontender, nondistended, + bowel sounds  Extremities: warm dry without cyanosis clubbing or edema  Neuro: AAOx3, drowsy, nonfocal  Skin: Without rashes exudates or nodules  Psych: Normal affect and demeanor, pleasant  Data Review   Micro Results Recent Results (from the past 240 hour(s))  TECHNOLOGIST REVIEW     Status: None   Collection Time: 05/29/15  1:13 PM  Result Value Ref Range Status   Technologist Review 5% myelocytes  Final    Radiology Reports Ct Abdomen Pelvis Wo Contrast  05/18/2015  CLINICAL DATA:  65 year old male with abdominal pelvic pain, nausea and right lower lung opacity identified on recent chest radiograph. EXAM: CT CHEST, ABDOMEN AND PELVIS WITHOUT CONTRAST TECHNIQUE: Multidetector CT imaging of the chest, abdomen and pelvis was performed following the standard protocol without IV contrast. COMPARISON:  05/18/2015 and prior radiographs.  07/26/2014 chest CT. FINDINGS: CT CHEST Mediastinum/Nodes: Multiple enlarged mediastinal and right hilar lymph nodes are identified. Index nodes include the  following: A 3.8 x 3.8 cm right peritracheal node (image 25). A 3.4 x 4.6 cm sub carinal node (image 33). A 1.7 cm right hilar node (image 32). There is no evidence of pericardial effusion. Ectasia of the ascending aorta measuring 3.5 cm noted. Lungs/Pleura: An ill-defined 7 x 9 cm mass within the central right middle/ lower lobes identified extending to the right hilum. Nodular interstitial thickening within the right lower lobe identified. Ill-defined ground-glass and nodular opacities within the right upper lobe are identified. A small right pleural effusion is noted with pleural nodularity. 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 There is no evidence of left pleural effusion. Emphysema is identified. Musculoskeletal: No acute or suspicious abnormalities identified. CT ABDOMEN AND PELVIS Please note that parenchymal abnormalities may be missed without intravenous contrast. Hepatobiliary: Multiple faint hypodense lesions within the liver are noted. The largest lesion  measures 1.5 cm and anterior liver (image 57). The gallbladder is unremarkable. There is no evidence of biliary dilatation. Pancreas: Unremarkable Spleen: Unremarkable Adrenals/Urinary Tract: Mild bilateral renal atrophy noted. No definite mass, hydronephrosis urinary calculi noted. The bladder and adrenal glands are unremarkable. Stomach/Bowel: Colonic diverticulosis noted without diverticulitis. No evidence bowel obstruction or definite focal bowel wall thickening. Vascular/Lymphatic: Enlarged retroperitoneal and upper abdominal lymph nodes identified. An index 1.8 x 2.1 cm right retroperitoneal node (image 68) noted. Aortic atherosclerotic calcifications noted without aneurysm. Reproductive: Mild prostate enlargement noted. Other: No free fluid, pneumoperitoneum or focal collection. Musculoskeletal: No acute or suspicious abnormalities identified. IMPRESSION: 7 x 9 cm central right middle  lobe/lower lobe mass compatible with malignancy. Right lower lobe nodular interstitial thickening,, right upper lobe nodules, right pleural nodules with small right pleural effusion, enlarged mediastinal, right hilar and upper abdominal lymph nodes, and ill-defined hepatic lesions- compatible with metastatic disease. Electronically Signed   By: Margarette Canada M.D.   On: 05/18/2015 14:51   Dg Chest 1 View  05/19/2015  CLINICAL DATA:  Status post right-sided thoracentesis EXAM: CHEST 1 VIEW COMPARISON:  May 18, 2015 chest CT and chest radiograph FINDINGS: No pneumothorax. There has been near complete resolution of pleural effusion on the right. There is persistent right middle mass with adjacent lobe airspace consolidation. There is scarring and atelectatic change in the left base. The heart size and pulmonary vascularity are within normal limits. Multiple foci of adenopathy are noted throughout the mediastinum superiorly and on the right. IMPRESSION: No pneumothorax. Essentially complete resolution of pleural effusion on the right. Mass with airspace consolidation remains on the right, unchanged. Extensive adenopathy present. Scarring and atelectatic change are noted in the left base. Electronically Signed   By: Lowella Grip III M.D.   On: 05/19/2015 14:03   Dg Chest 2 View  05/29/2015  CLINICAL DATA:  COPD, hypertension, recent diagnosis of small cell lung cancer with metastasis, presented today with fatigue, weakness, weight loss and poor oral intake, hiccups, back pain, abdominal pain, nausea EXAM: CHEST  2 VIEW COMPARISON:  05/19/2015 FINDINGS: Normal heart size and pulmonary vascularity. Mediastinal enlargement corresponding to extensive adenopathy on prior CT. Increased opacity at RIGHT lung base consistent with known tumor, increased atelectasis and pleural effusion. Underlying emphysematous and bronchitic changes. Subsegmental atelectasis and scarring at LEFT base persists. No pneumothorax. Bones  demineralized. IMPRESSION: COPD changes with increased RIGHT basilar opacity corresponding to known tumor which increased atelectasis and pleural effusion. Mediastinal adenopathy. Electronically Signed   By: Lavonia Dana M.D.   On: 05/29/2015 19:59   Dg Chest 2 View  05/18/2015  CLINICAL DATA:  Shortness of breath and dry cough for 1-1/2 weeks. Former smoker for 50 years. EXAM: CHEST  2 VIEW COMPARISON:  Chest x-rays dated 01/02/2015 and 09/19/2014. FINDINGS: New ill-defined airspace opacity within the anterior aspect of the right lower lobe, extending to the right hilum. Chronic scarring/ atelectasis noted at the left lung base. Lungs are hyperexpanded indicating some degree of COPD. Coarse interstitial markings are seen within both lungs which is likely a related chronic interstitial lung disease. Heart size is upper normal. IMPRESSION: New dense ill-defined airspace opacity within the right lower lobe, extending to the right hilum. Additional vague density at the right hilum and within the overlying medial suprahilar region. This could represent pneumonia or airspace collapse related to a central obstructing mass. Recommend chest CT with contrast for further characterization. Hyperexpanded lungs indicating COPD/emphysema. Suspect associated chronic interstitial lung disease bilaterally.  Electronically Signed   By: Franki Cabot M.D.   On: 05/18/2015 12:37   Dg Abd 1 View  05/18/2015  CLINICAL DATA:  Lower abdominal pain, constipation. EXAM: ABDOMEN - 1 VIEW COMPARISON:  None. FINDINGS: The bowel gas pattern is normal. Phleboliths are noted in the pelvis. Inferior tip of liver extends iliac wing suggesting hepatomegaly. IMPRESSION: No evidence of bowel obstruction or ileus.  Possible hepatomegaly. Electronically Signed   By: Marijo Conception, M.D.   On: 05/18/2015 11:20   Ct Chest Wo Contrast  05/18/2015  CLINICAL DATA:  64 year old male with abdominal pelvic pain, nausea and right lower lung opacity  identified on recent chest radiograph. EXAM: CT CHEST, ABDOMEN AND PELVIS WITHOUT CONTRAST TECHNIQUE: Multidetector CT imaging of the chest, abdomen and pelvis was performed following the standard protocol without IV contrast. COMPARISON:  05/18/2015 and prior radiographs.  07/26/2014 chest CT. FINDINGS: CT CHEST Mediastinum/Nodes: Multiple enlarged mediastinal and right hilar lymph nodes are identified. Index nodes include the following: A 3.8 x 3.8 cm right peritracheal node (image 25). A 3.4 x 4.6 cm sub carinal node (image 33). A 1.7 cm right hilar node (image 32). There is no evidence of pericardial effusion. Ectasia of the ascending aorta measuring 3.5 cm noted. Lungs/Pleura: An ill-defined 7 x 9 cm mass within the central right middle/ lower lobes identified extending to the right hilum. Nodular interstitial thickening within the right lower lobe identified. Ill-defined ground-glass and nodular opacities within the right upper lobe are identified. A small right pleural effusion is noted with pleural nodularity. 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 There is no evidence of left pleural effusion. Emphysema is identified. Musculoskeletal: No acute or suspicious abnormalities identified. CT ABDOMEN AND PELVIS Please note that parenchymal abnormalities may be missed without intravenous contrast. Hepatobiliary: Multiple faint hypodense lesions within the liver are noted. The largest lesion measures 1.5 cm and anterior liver (image 57). The gallbladder is unremarkable. There is no evidence of biliary dilatation. Pancreas: Unremarkable Spleen: Unremarkable Adrenals/Urinary Tract: Mild bilateral renal atrophy noted. No definite mass, hydronephrosis urinary calculi noted. The bladder and adrenal glands are unremarkable. Stomach/Bowel: Colonic diverticulosis noted without diverticulitis. No evidence bowel obstruction or definite focal bowel wall thickening.  Vascular/Lymphatic: Enlarged retroperitoneal and upper abdominal lymph nodes identified. An index 1.8 x 2.1 cm right retroperitoneal node (image 68) noted. Aortic atherosclerotic calcifications noted without aneurysm. Reproductive: Mild prostate enlargement noted. Other: No free fluid, pneumoperitoneum or focal collection. Musculoskeletal: No acute or suspicious abnormalities identified. IMPRESSION: 7 x 9 cm central right middle lobe/lower lobe mass compatible with malignancy. Right lower lobe nodular interstitial thickening,, right upper lobe nodules, right pleural nodules with small right pleural effusion, enlarged mediastinal, right hilar and upper abdominal lymph nodes, and ill-defined hepatic lesions- compatible with metastatic disease. Electronically Signed   By: Margarette Canada M.D.   On: 05/18/2015 14:51   US Thoracentesis Asp Pleural Space W/img Guide  05/19/2015  INDICATION: Newly diagnosed right lung mass with associated pleural effusion. Request diagnostic and therapeutic thoracentesis EXAM: ULTRASOUND GUIDED RIGHT THORACENTESIS MEDICATIONS: None. COMPLICATIONS: None immediate. PROCEDURE: An ultrasound guided thoracentesis was thoroughly discussed with the patient and questions answered. The benefits, risks, alternatives and complications were also discussed. The patient understands and wishes to proceed with the procedure. Written consent was obtained. Ultrasound was performed to localize and mark an adequate pocket of fluid in the right chest. The area was then prepped and draped in the normal sterile fashion.  1% Lidocaine was used for local anesthesia. Under ultrasound guidance a Safe-T-Centesis catheter was introduced. Thoracentesis was performed. The catheter was removed and a dressing applied. FINDINGS: A total of approximately 830 mL of hazy, dark yellow fluid was removed. Samples were sent to the laboratory as requested by the clinical team. Limited ultrasound imaging of the liver during this  procedure finds numerous lesions suspicious for metastatic disease. IMPRESSION: Successful ultrasound guided right thoracentesis yielding 830 mL of pleural fluid. Read by: Ascencion Dike PA-C Electronically Signed   By: Lucrezia Europe M.D.   On: 05/19/2015 14:26    CBC  Recent Labs Lab 05/29/15 1313 05/30/15 0341 05/31/15 0340 06/01/15 0410 06/02/15 0334  WBC 16.0* 15.6* 17.5* 16.3* 11.9*  HGB 11.3* 9.9* 9.7* 9.6* 9.1*  HCT 32.5* 28.3* 27.8* 28.2* 26.2*  PLT 78 Large platelets present* 72* 76* 69* 62*  MCV 91.3 90.7 90.8 90.1 90.7  MCH 31.7 31.7 31.7 30.7 31.5  MCHC 34.8 35.0 34.9 34.0 34.7  RDW 15.6* 15.7* 16.1* 16.4* 16.4*  LYMPHSABS 2.0  --   --   --   --   MONOABS 1.5*  --   --   --   --   EOSABS 0.1  --   --   --   --   BASOSABS 0.2*  --   --   --   --     Chemistries   Recent Labs Lab 05/29/15 1313 05/30/15 0341 05/31/15 0340 06/01/15 0410 06/02/15 0334  NA 135* 136 138 139 136  K 4.8 4.7 4.9 4.9 5.0  CL  --  99* 104 107 107  CO2 21* 20* 18* 19* 18*  GLUCOSE 85 100* 141* 121* 109*  BUN 76.9* 83* 99* 98* 100*  CREATININE 1.9* 1.80* 1.77* 1.58* 1.32*  CALCIUM 10.2 9.5 8.9 8.6* 8.1*  AST 610* 526* 637* 609* 585*  ALT 139* 120* 134* 129* 121*  ALKPHOS 923* 736* 775* 779* 757*  BILITOT 3.73* 4.0* 4.7* 4.1* 4.5*   ------------------------------------------------------------------------------------------------------------------ estimated creatinine clearance is 55 mL/min (by C-G formula based on Cr of 1.32). ------------------------------------------------------------------------------------------------------------------ No results for input(s): HGBA1C in the last 72 hours. ------------------------------------------------------------------------------------------------------------------ No results for input(s): CHOL, HDL, LDLCALC, TRIG, CHOLHDL, LDLDIRECT in the last 72  hours. ------------------------------------------------------------------------------------------------------------------ No results for input(s): TSH, T4TOTAL, T3FREE, THYROIDAB in the last 72 hours.  Invalid input(s): FREET3 ------------------------------------------------------------------------------------------------------------------ No results for input(s): VITAMINB12, FOLATE, FERRITIN, TIBC, IRON, RETICCTPCT in the last 72 hours.  Coagulation profile No results for input(s): INR, PROTIME in the last 168 hours.  No results for input(s): DDIMER in the last 72 hours.  Cardiac Enzymes No results for input(s): CKMB, TROPONINI, MYOGLOBIN in the last 168 hours.  Invalid input(s): CK ------------------------------------------------------------------------------------------------------------------ Invalid input(s): POCBNP    Dontreal Miera MD. on 06/02/2015 at 11:51 AM  Between 7am to 7pm - Pager - [phone]  After 7pm go to [email or web address] - password [email or web address]  And look for the night coverage person covering for me after hours  [facility] Office  [phone]

## 2015-06-03 ENCOUNTER — Inpatient Hospital Stay (HOSPITAL_COMMUNITY): Payer: Medicare Other

## 2015-06-03 ENCOUNTER — Ambulatory Visit: Payer: Medicare Other | Admitting: Internal Medicine

## 2015-06-03 LAB — BASIC METABOLIC PANEL
ANION GAP: 11 (ref 5–15)
BUN: 89 mg/dL — ABNORMAL HIGH (ref 6–20)
CHLORIDE: 108 mmol/L (ref 101–111)
CO2: 18 mmol/L — AB (ref 22–32)
CREATININE: 1.29 mg/dL — AB (ref 0.61–1.24)
Calcium: 8 mg/dL — ABNORMAL LOW (ref 8.9–10.3)
GFR calc non Af Amer: 56 mL/min — ABNORMAL LOW (ref >60)
GLUCOSE: 119 mg/dL — AB (ref 65–99)
Potassium: 4.3 mmol/L (ref 3.5–5.1)
Sodium: 137 mmol/L (ref 135–145)

## 2015-06-03 LAB — CBC
HEMATOCRIT: 24 % — AB (ref 39.0–52.0)
HEMOGLOBIN: 8.3 g/dL — AB (ref 13.0–17.0)
MCH: 30.9 pg (ref 26.0–34.0)
MCHC: 34.6 g/dL (ref 30.0–36.0)
MCV: 89.2 fL (ref 78.0–100.0)
Platelets: 56 10*3/uL — ABNORMAL LOW (ref 150–400)
RBC: 2.69 MIL/uL — ABNORMAL LOW (ref 4.22–5.81)
RDW: 16.2 % — ABNORMAL HIGH (ref 11.5–15.5)
WBC: 9.6 10*3/uL (ref 4.0–10.5)

## 2015-06-03 MED ORDER — GADOBENATE DIMEGLUMINE 529 MG/ML IV SOLN
15.0000 mL | Freq: Once | INTRAVENOUS | Status: AC | PRN
  Administered 2015-06-03: 15 mL via INTRAVENOUS

## 2015-06-03 MED FILL — Sodium Chloride IV Soln 0.9%: INTRAVENOUS | Qty: 50 | Status: AC

## 2015-06-03 MED FILL — Dexamethasone Sodium Phosphate Inj 10 MG/ML: INTRAMUSCULAR | Qty: 1 | Status: AC

## 2015-06-03 NOTE — Progress Notes (Signed)
CSW received consult for New SNF.   CSW met with pt family this morning to discuss. Pt family agreeable to SNF.   CSW initiated SNF search.  CSW provided bed offers and pt family will look into facilities and notify CSW of decision.   Anticipate d/c tomorrow.  Full psychosocial assessment to follow.   Alison Murray, MSW, [place] [facility] [phone]

## 2015-06-03 NOTE — [place] FL2 (Signed)
[place] MEDICAID FL2 LEVEL OF CARE SCREENING TOOL     IDENTIFICATION  Patient Name: Shane Vasquez Birthdate: [birth year] Sex: male Admission Date (Current Location): 05/29/2015  [facility] and [place] Number:  Herbalist and Address:  [facility],  [place] [address] [place]      Provider Number: [id number]  Attending Physician Name and Address:  Albertine Patricia, MD  Relative Name and Phone Number:       Current Level of Care: Hospital Recommended Level of Care: [place] Prior Approval Number:    Date Approved/Denied:   PASRR Number: [id number]  Discharge Plan: SNF    Current Diagnoses: Patient Active Problem List   Diagnosis Date Noted  . Protein-calorie malnutrition, severe 05/31/2015  . Hyperuricemia   . Metastatic carcinoma ([place])   . Small cell carcinoma of lung ([place]) 05/29/2015  . 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 Lung mass   . Metastatic lung cancer (metastasis from lung to other site) [facility])   . Acute respiratory failure with hypoxia ([place]) 05/18/2015  . Shortness of breath 05/18/2015  . Pleural effusion 05/18/2015  . Leukocytosis 05/18/2015  . Acute respiratory failure ([place]) 05/18/2015  . Liver lesion 05/18/2015  . Elevated LFTs 05/18/2015  . Neoplasm of lung   . Enlarged lymph nodes   . Anemia, iron deficiency 08/01/2014  . Empyema ([place])   . Left lower lobe pneumonia 07/21/2014  . Community acquired pneumonia 07/21/2014  . CERVICAL RADICULOPATHY, RIGHT 08/12/2009  . INTERNAL HEMORRHOIDS 02/29/2008  . Dyslipidemia 08/15/2006  . Essential hypertension 08/15/2006  . GERD 08/15/2006  . Diverticulosis of large intestine 08/15/2006  . SKIN CANCER, HX OF 08/15/2006    Orientation RESPIRATION BLADDER Height & Weight     Self, Time, Situation, Place  O2 (3L O2) Incontinent, External catheter Weight: 167 lb 12.3 oz (76.1 kg) Height:  '5\' 9"'$  (175.3 cm)  BEHAVIORAL SYMPTOMS/MOOD  NEUROLOGICAL BOWEL NUTRITION STATUS   (NONE)  (NONE) Continent Diet (Diet Regular)  AMBULATORY STATUS COMMUNICATION OF NEEDS Skin   Extensive Assist Verbally Normal                       Personal Care Assistance Level of Assistance  Bathing, Feeding, Dressing Bathing Assistance: Limited assistance Feeding assistance: Independent Dressing Assistance: Limited assistance     Functional Limitations Info  Sight, Hearing, Speech Sight Info: Adequate Hearing Info: Adequate Speech Info: Adequate    SPECIAL CARE FACTORS FREQUENCY  PT (By licensed PT), OT (By licensed OT)     PT Frequency: 5 x a week OT Frequency: 5 x a week            Contractures Contractures Info: Not present    Additional Factors Info  Code Status, Allergies Code Status Info: DNR code status Allergies Info: Bee Venom           Current Medications (06/03/2015):  This is the current hospital active medication list Current Facility-Administered Medications  Medication Dose Route Frequency Provider Last Rate Last Dose  . 0.9 %  sodium chloride infusion   Intravenous Continuous Heath Lark, MD 50 mL/hr at 06/02/15 1448    . acetaminophen (TYLENOL) tablet 650 mg  650 mg Oral Q6H PRN Maryann Mikhail, DO       Or  . acetaminophen (TYLENOL) suppository 650 mg  650 mg Rectal Q6H PRN Maryann Mikhail, DO      . alteplase (CATHFLO ACTIVASE) injection 2 mg  2 mg Intracatheter Once PRN Curt Bears, MD      . chlorproMAZINE (THORAZINE) injection 25 mg  25 mg Intramuscular TID PRN Maryann Mikhail, DO   25 mg at 06/02/15 0125  . citalopram (CELEXA) tablet 40 mg  40 mg Oral Daily Maryann Mikhail, DO   40 mg at 06/03/15 0958  . docusate sodium (COLACE) capsule 100 mg  100 mg Oral Daily Maryann Mikhail, DO   100 mg at 06/03/15 4431  . feeding supplement (BOOST / RESOURCE BREEZE) liquid 1 Container  1 Container Oral TID BM Albertine Patricia, MD   1 Container at 06/03/15 1000  . heparin lock flush 100 unit/mL  500  Units Intracatheter Once PRN Curt Bears, MD      . heparin lock flush 100 unit/mL  250 Units Intracatheter Once PRN Curt Bears, MD      . ipratropium-albuterol (DUONEB) 0.5-2.5 (3) MG/3ML nebulizer solution 3 mL  3 mL Nebulization Q4H PRN Maryann Mikhail, DO      . lactose free nutrition (BOOST PLUS) liquid 237 mL  237 mL Oral BID BM Maryann Mikhail, DO   237 mL at 06/03/15 1000  . LORazepam (ATIVAN) injection 1 mg  1 mg Intravenous Once [employer], DO      . morphine 2 MG/ML injection 2 mg  2 mg Intravenous Q4H PRN Maryann Mikhail, DO   2 mg at 06/01/15 2359  . ondansetron (ZOFRAN) tablet 4 mg  4 mg Oral Q6H PRN Maryann Mikhail, DO       Or  . ondansetron (ZOFRAN) injection 4 mg  4 mg Intravenous Q6H PRN Maryann Mikhail, DO      . oxyCODONE (Oxy IR/ROXICODONE) immediate release tablet 5-10 mg  5-10 mg Oral Q4H PRN Maryann Mikhail, DO   10 mg at 05/31/15 2012  . polyethylene glycol (MIRALAX / GLYCOLAX) packet 17 g  17 g Oral Daily PRN Maryann Mikhail, DO      . senna (SENOKOT) tablet 8.6 mg  1 tablet Oral Daily PRN Maryann Mikhail, DO      . sodium chloride flush (NS) 0.9 % injection 10 mL  10 mL Intracatheter PRN Curt Bears, MD      . sodium chloride flush (NS) 0.9 % injection 3 mL  3 mL Intravenous PRN Curt Bears, MD         Discharge Medications: Please see discharge summary for a list of discharge medications.  Relevant Imaging Results:  Relevant Lab Results:   Additional Information SSN: [id number] Pt currently undergoing chemotherapy treatment-received first treatment 4/16 and next treatment will be in 3 weeks at outpatient cancer center.   Matheus Spiker A, LCSW

## 2015-06-03 NOTE — Progress Notes (Signed)
[facility]                                                                              Patient Demographics  Shane Vasquez, is a 65 y.o. male, DOB - 1950/03/05, PYP:[id number]  Admit date - 05/29/2015   Admitting Physician Shane Patricia, MD  Outpatient Primary MD for the patient is Shane Cowden, MD  LOS - 5   No chief complaint on file.     HPI on 05/29/2015  Shane Vasquez is a 65 y.o. Male with a history of COPD, hypertension recently diagnosed small cell lung cancer with metastasis, that presented to Shane Vasquez office (oncology) today and was found to have fatigue as well as weakness with poor oral intake and weight loss. Patient has also complained of having the hiccups over the last several days. He also complains of back pain as well as abdominal pain and nausea. Patient denies any headache, visual changes, dizziness. Patient does have a history of smoking but quit approximate 5 years ago. At this time, patient states he is very sleepy. He denies any chest pain, shortness of breath, problems urinating. Patient directly admitted from Shane Vasquez office for palliative chemotherapy. In the office, labs were drawn, patient found to have leukocytosis, thrombocytopenia.   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 Started on chemotherapy , last treatment was 4/16 -MRI brain with no evidence of brain metastasis, but significant for metastatic deposit in Clivus and C2 vertebral body  Dyspnea  -Secondary to volume overload. (no history of CHF) -Resolved -patient was receiving aggressive IV hydration s/p chemo  -Was given nebs, O2, and lasix -Continue to monitor closely  Acute kidney injury vs Chronic kidney disease, stage III -Secondary dehydration and poor oral intake -Creatinine was normal in 2016, however, earlier April 2017, creatinine 1.6 -Upon  admission, Creatinine 1.8 -Creatinine currently 1.3 -Hold HCTZ -Continue IV fluids and continue to monitor BMP  Abdominal pain with nausea/ elevated liver enzymes -Continue pain control and antiemetics as needed -LFTs elevated, likely secondary to liver metastasis -Continue to monitor CMP  Thrombocytopenia -Likely secondary to the above -Currently no evidence of bleeding, continue to monitor CBC  Leukocytosis -Possibly reactive vs infectious -No complaints of SOB or cough, or problems urinating. -resolved   Hyperuricemia -Improved -Possibly due to tumor lysis -Given rasburicase by oncology -Continue to monitor   Essential hypertension -Pressure has been soft. Will continue to monitor closely -Hold HCTZ  Depression -Continue Celexa  Severe malnutrition/FTT -Likely secondary to the above -nutrition consulted  Code Status: DNR  Family Communication: Brother and wife  at bedside  Disposition Plan: To SNF when bed is available, hopefully in a.m.  Time Spent in minutes   30 minutes  Procedures  None  Consults   Oncology  DVT Prophylaxis  SCDs  Lab Results  Component Value Date   PLT 56* 06/03/2015    Medications  Scheduled Meds: . citalopram  40 mg Oral Daily  . docusate sodium  100 mg Oral Daily  . feeding supplement  1 Container Oral TID BM  . lactose free nutrition  237 mL Oral BID BM  . LORazepam  1 mg Intravenous Once   Continuous Infusions: . sodium chloride 50 mL/hr at 06/02/15 1448   PRN Meds:.acetaminophen **OR** acetaminophen, alteplase, chlorproMAZINE (THORAZINE) injection, heparin lock flush, heparin lock flush, ipratropium-albuterol, morphine injection, ondansetron **OR** ondansetron (ZOFRAN) IV, oxyCODONE, polyethylene glycol, senna, sodium chloride flush, sodium chloride flush  Antibiotics    Anti-infectives    None      Subjective:   Shane Vasquez seen and examined today. Denies cough.  Patient denies dizziness, chest pain,  abdominal pain.    Objective:   Filed Vitals:   06/02/15 1319 06/02/15 2141 06/03/15 0522 06/03/15 1419  BP: 101/51 106/67 121/50 100/52  Pulse: 87 93 87 86  Temp: 97.4 F (36.3 C) 97.4 F (36.3 C) 98 F (36.7 C) 98.6 F (37 C)  TempSrc: Oral Oral Oral Oral  Resp: '15 16 17 18  '$ Height:      Weight:   76.1 kg (167 lb 12.3 oz)   SpO2: 98% 93% 96% 94%    Wt Readings from Last 3 Encounters:  06/03/15 76.1 kg (167 lb 12.3 oz)  05/29/15 71.85 kg (158 lb 6.4 oz)  05/20/15 73.483 kg (162 lb)     Intake/Output Summary (Last 24 hours) at 06/03/15 1533 Last data filed at 06/03/15 1259  Gross per 24 hour  Intake 1116.67 ml  Output   1750 ml  Net -633.33 ml    Exam (no change)  General: Well developed, thin, NAD  HEENT: NCAT, mucous membranes moist  Cardiovascular: S1 S2 auscultated, no murmurs, RRR  Respiratory: Clear to auscultation bilaterally  Abdomen: Soft, nontender, nondistended, + bowel sounds  Extremities: warm dry without cyanosis clubbing or edema  Neuro: AAOx3, drowsy, nonfocal  Skin: Without rashes exudates or nodules  Psych: Normal affect and demeanor, pleasant  Data Review   Micro Results Recent Results (from the past 240 hour(s))  TECHNOLOGIST REVIEW     Status: None   Collection Time: 05/29/15  1:13 PM  Result Value Ref Range Status   Technologist Review 5% myelocytes  Final    Radiology Reports Ct Abdomen Pelvis Wo Contrast  05/18/2015  CLINICAL DATA:  65 year old male with abdominal pelvic pain, nausea and right lower lung opacity identified on recent chest radiograph. EXAM: CT CHEST, ABDOMEN AND PELVIS WITHOUT CONTRAST TECHNIQUE: Multidetector CT imaging of the chest, abdomen and pelvis was performed following the standard protocol without IV contrast. COMPARISON:  05/18/2015 and prior radiographs.  07/26/2014 chest CT. FINDINGS: CT CHEST Mediastinum/Nodes: Multiple enlarged mediastinal and right hilar lymph nodes are identified. Index nodes  include the following: A 3.8 x 3.8 cm right peritracheal node (image 25). A 3.4 x 4.6 cm sub carinal node (image 33). A 1.7 cm right hilar node (image 32). There is no evidence of pericardial effusion. Ectasia of the ascending aorta measuring 3.5 cm noted. Lungs/Pleura: An ill-defined 7 x 9 cm mass within the central right middle/ lower lobes identified extending to the right hilum. Nodular interstitial thickening within the right lower lobe identified. Ill-defined ground-glass and nodular opacities within the right upper lobe are identified. A small right pleural effusion is noted with pleural nodularity. 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 There is no evidence of left pleural effusion. Emphysema is identified. Musculoskeletal: No acute or suspicious abnormalities identified. CT ABDOMEN AND PELVIS Please note that parenchymal abnormalities may be missed without intravenous contrast. Hepatobiliary: Multiple faint hypodense lesions within the liver are noted.  The largest lesion measures 1.5 cm and anterior liver (image 57). The gallbladder is unremarkable. There is no evidence of biliary dilatation. Pancreas: Unremarkable Spleen: Unremarkable Adrenals/Urinary Tract: Mild bilateral renal atrophy noted. No definite mass, hydronephrosis urinary calculi noted. The bladder and adrenal glands are unremarkable. Stomach/Bowel: Colonic diverticulosis noted without diverticulitis. No evidence bowel obstruction or definite focal bowel wall thickening. Vascular/Lymphatic: Enlarged retroperitoneal and upper abdominal lymph nodes identified. An index 1.8 x 2.1 cm right retroperitoneal node (image 68) noted. Aortic atherosclerotic calcifications noted without aneurysm. Reproductive: Mild prostate enlargement noted. Other: No free fluid, pneumoperitoneum or focal collection. Musculoskeletal: No acute or suspicious abnormalities identified. IMPRESSION: 7 x 9 cm central right  middle lobe/lower lobe mass compatible with malignancy. Right lower lobe nodular interstitial thickening,, right upper lobe nodules, right pleural nodules with small right pleural effusion, enlarged mediastinal, right hilar and upper abdominal lymph nodes, and ill-defined hepatic lesions- compatible with metastatic disease. Electronically Signed   By: Margarette Canada M.D.   On: 05/18/2015 14:51   Dg Chest 1 View  05/19/2015  CLINICAL DATA:  Status post right-sided thoracentesis EXAM: CHEST 1 VIEW COMPARISON:  May 18, 2015 chest CT and chest radiograph FINDINGS: No pneumothorax. There has been near complete resolution of pleural effusion on the right. There is persistent right middle mass with adjacent lobe airspace consolidation. There is scarring and atelectatic change in the left base. The heart size and pulmonary vascularity are within normal limits. Multiple foci of adenopathy are noted throughout the mediastinum superiorly and on the right. IMPRESSION: No pneumothorax. Essentially complete resolution of pleural effusion on the right. Mass with airspace consolidation remains on the right, unchanged. Extensive adenopathy present. Scarring and atelectatic change are noted in the left base. Electronically Signed   By: Lowella Grip III M.D.   On: 05/19/2015 14:03   Dg Chest 2 View  05/29/2015  CLINICAL DATA:  COPD, hypertension, recent diagnosis of small cell lung cancer with metastasis, presented today with fatigue, weakness, weight loss and poor oral intake, hiccups, back pain, abdominal pain, nausea EXAM: CHEST  2 VIEW COMPARISON:  05/19/2015 FINDINGS: Normal heart size and pulmonary vascularity. Mediastinal enlargement corresponding to extensive adenopathy on prior CT. Increased opacity at RIGHT lung base consistent with known tumor, increased atelectasis and pleural effusion. Underlying emphysematous and bronchitic changes. Subsegmental atelectasis and scarring at LEFT base persists. No pneumothorax.  Bones demineralized. IMPRESSION: COPD changes with increased RIGHT basilar opacity corresponding to known tumor which increased atelectasis and pleural effusion. Mediastinal adenopathy. Electronically Signed   By: Lavonia Dana M.D.   On: 05/29/2015 19:59   Dg Chest 2 View  05/18/2015  CLINICAL DATA:  Shortness of breath and dry cough for 1-1/2 weeks. Former smoker for 50 years. EXAM: CHEST  2 VIEW COMPARISON:  Chest x-rays dated 01/02/2015 and 09/19/2014. FINDINGS: New ill-defined airspace opacity within the anterior aspect of the right lower lobe, extending to the right hilum. Chronic scarring/ atelectasis noted at the left lung base. Lungs are hyperexpanded indicating some degree of COPD. Coarse interstitial markings are seen within both lungs which is likely a related chronic interstitial lung disease. Heart size is upper normal. IMPRESSION: New dense ill-defined airspace opacity within the right lower lobe, extending to the right hilum. Additional vague density at the right hilum and within the overlying medial suprahilar region. This could represent pneumonia or airspace collapse related to a central obstructing mass. Recommend chest CT with contrast for further characterization. Hyperexpanded lungs indicating COPD/emphysema. Suspect associated chronic interstitial  lung disease bilaterally. Electronically Signed   By: Franki Cabot M.D.   On: 05/18/2015 12:37   Dg Abd 1 View  05/18/2015  CLINICAL DATA:  Lower abdominal pain, constipation. EXAM: ABDOMEN - 1 VIEW COMPARISON:  None. FINDINGS: The bowel gas pattern is normal. Phleboliths are noted in the pelvis. Inferior tip of liver extends iliac wing suggesting hepatomegaly. IMPRESSION: No evidence of bowel obstruction or ileus.  Possible hepatomegaly. Electronically Signed   By: Marijo Conception, M.D.   On: 05/18/2015 11:20   Ct Chest Wo Contrast  05/18/2015  CLINICAL DATA:  65 year old male with abdominal pelvic pain, nausea and right lower lung opacity  identified on recent chest radiograph. EXAM: CT CHEST, ABDOMEN AND PELVIS WITHOUT CONTRAST TECHNIQUE: Multidetector CT imaging of the chest, abdomen and pelvis was performed following the standard protocol without IV contrast. COMPARISON:  05/18/2015 and prior radiographs.  07/26/2014 chest CT. FINDINGS: CT CHEST Mediastinum/Nodes: Multiple enlarged mediastinal and right hilar lymph nodes are identified. Index nodes include the following: A 3.8 x 3.8 cm right peritracheal node (image 25). A 3.4 x 4.6 cm sub carinal node (image 33). A 1.7 cm right hilar node (image 32). There is no evidence of pericardial effusion. Ectasia of the ascending aorta measuring 3.5 cm noted. Lungs/Pleura: An ill-defined 7 x 9 cm mass within the central right middle/ lower lobes identified extending to the right hilum. Nodular interstitial thickening within the right lower lobe identified. Ill-defined ground-glass and nodular opacities within the right upper lobe are identified. A small right pleural effusion is noted with pleural nodularity. 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 There is no evidence of left pleural effusion. Emphysema is identified. Musculoskeletal: No acute or suspicious abnormalities identified. CT ABDOMEN AND PELVIS Please note that parenchymal abnormalities may be missed without intravenous contrast. Hepatobiliary: Multiple faint hypodense lesions within the liver are noted. The largest lesion measures 1.5 cm and anterior liver (image 57). The gallbladder is unremarkable. There is no evidence of biliary dilatation. Pancreas: Unremarkable Spleen: Unremarkable Adrenals/Urinary Tract: Mild bilateral renal atrophy noted. No definite mass, hydronephrosis urinary calculi noted. The bladder and adrenal glands are unremarkable. Stomach/Bowel: Colonic diverticulosis noted without diverticulitis. No evidence bowel obstruction or definite focal bowel wall thickening.  Vascular/Lymphatic: Enlarged retroperitoneal and upper abdominal lymph nodes identified. An index 1.8 x 2.1 cm right retroperitoneal node (image 68) noted. Aortic atherosclerotic calcifications noted without aneurysm. Reproductive: Mild prostate enlargement noted. Other: No free fluid, pneumoperitoneum or focal collection. Musculoskeletal: No acute or suspicious abnormalities identified. IMPRESSION: 7 x 9 cm central right middle lobe/lower lobe mass compatible with malignancy. Right lower lobe nodular interstitial thickening,, right upper lobe nodules, right pleural nodules with small right pleural effusion, enlarged mediastinal, right hilar and upper abdominal lymph nodes, and ill-defined hepatic lesions- compatible with metastatic disease. Electronically Signed   By: Margarette Canada M.D.   On: 05/18/2015 14:51   Mr Jeri Cos CH Contrast  06/03/2015  CLINICAL DATA:  Metastatic lung cancer.  Staging. EXAM: MRI HEAD WITHOUT AND WITH CONTRAST TECHNIQUE: Multiplanar, multiecho pulse sequences of the brain and surrounding structures were obtained without and with intravenous contrast. CONTRAST:  83m MULTIHANCE GADOBENATE DIMEGLUMINE 529 MG/ML IV SOLN COMPARISON:  None. FINDINGS: Ventricle size normal.  Cerebral volume normal for age. Negative for acute infarct. Scattered small nonenhancing white matter hyperintensities bilaterally most consistent with chronic microvascular ischemia. Negative for intracranial hemorrhage. Negative for edema in the brain. Postcontrast imaging reveals no enhancing brain  lesions. Vascular enhancement normal. Leptomeningeal enhancement normal. Bony lesions are present in the clivus and C2 vertebral body which enhance and are consistent with metastatic disease. No other skull lesions identified. IMPRESSION: Metastatic deposits in the clivus and C2 vertebral body. Negative for metastatic disease to the brain. Electronically Signed   By: Franchot Gallo M.D.   On: 06/03/2015 07:17   US  Thoracentesis Asp Pleural Space W/img Guide  05/19/2015  INDICATION: Newly diagnosed right lung mass with associated pleural effusion. Request diagnostic and therapeutic thoracentesis EXAM: ULTRASOUND GUIDED RIGHT THORACENTESIS MEDICATIONS: None. COMPLICATIONS: None immediate. PROCEDURE: An ultrasound guided thoracentesis was thoroughly discussed with the patient and questions answered. The benefits, risks, alternatives and complications were also discussed. The patient understands and wishes to proceed with the procedure. Written consent was obtained. Ultrasound was performed to localize and mark an adequate pocket of fluid in the right chest. The area was then prepped and draped in the normal sterile fashion. 1% Lidocaine was used for local anesthesia. Under ultrasound guidance a Safe-T-Centesis catheter was introduced. Thoracentesis was performed. The catheter was removed and a dressing applied. FINDINGS: A total of approximately 830 mL of hazy, dark yellow fluid was removed. Samples were sent to the laboratory as requested by the clinical team. Limited ultrasound imaging of the liver during this procedure finds numerous lesions suspicious for metastatic disease. IMPRESSION: Successful ultrasound guided right thoracentesis yielding 830 mL of pleural fluid. Read by: Ascencion Dike PA-C Electronically Signed   By: Lucrezia Europe M.D.   On: 05/19/2015 14:26    CBC  Recent Labs Lab 05/29/15 1313 05/30/15 0341 05/31/15 0340 06/01/15 0410 06/02/15 0334 06/03/15 0332  WBC 16.0* 15.6* 17.5* 16.3* 11.9* 9.6  HGB 11.3* 9.9* 9.7* 9.6* 9.1* 8.3*  HCT 32.5* 28.3* 27.8* 28.2* 26.2* 24.0*  PLT 78 Large platelets present* 72* 76* 69* 62* 56*  MCV 91.3 90.7 90.8 90.1 90.7 89.2  MCH 31.7 31.7 31.7 30.7 31.5 30.9  MCHC 34.8 35.0 34.9 34.0 34.7 34.6  RDW 15.6* 15.7* 16.1* 16.4* 16.4* 16.2*  LYMPHSABS 2.0  --   --   --   --   --   MONOABS 1.5*  --   --   --   --   --   EOSABS 0.1  --   --   --   --   --   BASOSABS  0.2*  --   --   --   --   --     Chemistries   Recent Labs Lab 05/29/15 1313 05/30/15 0341 05/31/15 0340 06/01/15 0410 06/02/15 0334 06/03/15 0332  NA 135* 136 138 139 136 137  K 4.8 4.7 4.9 4.9 5.0 4.3  CL  --  99* 104 107 107 108  CO2 21* 20* 18* 19* 18* 18*  GLUCOSE 85 100* 141* 121* 109* 119*  BUN 76.9* 83* 99* 98* 100* 89*  CREATININE 1.9* 1.80* 1.77* 1.58* 1.32* 1.29*  CALCIUM 10.2 9.5 8.9 8.6* 8.1* 8.0*  AST 610* 526* 637* 609* 585*  --   ALT 139* 120* 134* 129* 121*  --   ALKPHOS 923* 736* 775* 779* 757*  --   BILITOT 3.73* 4.0* 4.7* 4.1* 4.5*  --    ------------------------------------------------------------------------------------------------------------------ estimated creatinine clearance is 56.3 mL/min (by C-G formula based on Cr of 1.29). ------------------------------------------------------------------------------------------------------------------ No results for input(s): HGBA1C in the last 72 hours. ------------------------------------------------------------------------------------------------------------------ No results for input(s): CHOL, HDL, LDLCALC, TRIG, CHOLHDL, LDLDIRECT in the last 72 hours. ------------------------------------------------------------------------------------------------------------------ No results for input(s):  TSH, T4TOTAL, T3FREE, THYROIDAB in the last 72 hours.  Invalid input(s): FREET3 ------------------------------------------------------------------------------------------------------------------ No results for input(s): VITAMINB12, FOLATE, FERRITIN, TIBC, IRON, RETICCTPCT in the last 72 hours.  Coagulation profile No results for input(s): INR, PROTIME in the last 168 hours.  No results for input(s): DDIMER in the last 72 hours.  Cardiac Enzymes No results for input(s): CKMB, TROPONINI, MYOGLOBIN in the last 168 hours.  Invalid input(s):  CK ------------------------------------------------------------------------------------------------------------------ Invalid input(s): POCBNP    Megham Dwyer MD. on 06/03/2015 at 3:33 PM  Between 7am to 7pm - Pager - [phone]  After 7pm go to [email or web address] - password [email or web address]  And look for the night coverage person covering for me after hours  [facility] Office  [phone]

## 2015-06-03 NOTE — NC FL2 (Deleted)
[place] MEDICAID FL2 LEVEL OF CARE SCREENING TOOL     IDENTIFICATION  Patient Name: Shane Vasquez Birthdate: 1950/03/04 Sex: male Admission Date (Current Location): 05/29/2015  [facility] and [place] Number:  Herbalist and Address:  [facility],  [place] [address] [place]      Provider Number: [id number]  Attending Physician Name and Address:  Albertine Patricia, MD  Relative Name and Phone Number:       Current Level of Care: Hospital Recommended Level of Care: [place] Prior Approval Number:    Date Approved/Denied:   PASRR Number:    Discharge Plan: SNF    Current Diagnoses: Patient Active Problem List   Diagnosis Date Noted  . Protein-calorie malnutrition, severe 05/31/2015  . Hyperuricemia   . Metastatic carcinoma ([place])   . Small cell carcinoma of lung ([place]) 05/29/2015  . 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 Lung mass   . Metastatic lung cancer (metastasis from lung to other site) [facility])   . Acute respiratory failure with hypoxia ([place]) 05/18/2015  . Shortness of breath 05/18/2015  . Pleural effusion 05/18/2015  . Leukocytosis 05/18/2015  . Acute respiratory failure ([place]) 05/18/2015  . Liver lesion 05/18/2015  . Elevated LFTs 05/18/2015  . Neoplasm of lung   . Enlarged lymph nodes   . Anemia, iron deficiency 08/01/2014  . Empyema ([place])   . Left lower lobe pneumonia 07/21/2014  . Community acquired pneumonia 07/21/2014  . CERVICAL RADICULOPATHY, RIGHT 08/12/2009  . INTERNAL HEMORRHOIDS 02/29/2008  . Dyslipidemia 08/15/2006  . Essential hypertension 08/15/2006  . GERD 08/15/2006  . Diverticulosis of large intestine 08/15/2006  . SKIN CANCER, HX OF 08/15/2006    Orientation RESPIRATION BLADDER Height & Weight     Self, Time, Situation, Place  O2 (3L O2) Incontinent, External catheter Weight: 167 lb 12.3 oz (76.1 kg) Height:  '5\' 9"'$  (175.3 cm)  BEHAVIORAL SYMPTOMS/MOOD  NEUROLOGICAL BOWEL NUTRITION STATUS   (NONE)  (NONE) Continent Diet (Diet Regular)  AMBULATORY STATUS COMMUNICATION OF NEEDS Skin   Extensive Assist Verbally Normal                       Personal Care Assistance Level of Assistance  Bathing, Feeding, Dressing Bathing Assistance: Limited assistance Feeding assistance: Independent Dressing Assistance: Limited assistance     Functional Limitations Info  Sight, Hearing, Speech Sight Info: Adequate Hearing Info: Adequate Speech Info: Adequate    SPECIAL CARE FACTORS FREQUENCY  PT (By licensed PT), OT (By licensed OT)     PT Frequency: 5 x a week OT Frequency: 5 x a week            Contractures Contractures Info: Not present    Additional Factors Info  Code Status, Allergies Code Status Info: DNR code status Allergies Info: Bee Venom           Current Medications (06/03/2015):  This is the current hospital active medication list Current Facility-Administered Medications  Medication Dose Route Frequency Provider Last Rate Last Dose  . 0.9 %  sodium chloride infusion   Intravenous Continuous Heath Lark, MD 50 mL/hr at 06/02/15 1448    . acetaminophen (TYLENOL) tablet 650 mg  650 mg Oral Q6H PRN Maryann Mikhail, DO       Or  . acetaminophen (TYLENOL) suppository 650 mg  650 mg Rectal Q6H PRN Maryann Mikhail, DO      . alteplase (CATHFLO ACTIVASE) injection 2  mg  2 mg Intracatheter Once PRN Curt Bears, MD      . chlorproMAZINE (THORAZINE) injection 25 mg  25 mg Intramuscular TID PRN Maryann Mikhail, DO   25 mg at 06/02/15 0125  . citalopram (CELEXA) tablet 40 mg  40 mg Oral Daily Maryann Mikhail, DO   40 mg at 06/03/15 0958  . docusate sodium (COLACE) capsule 100 mg  100 mg Oral Daily Maryann Mikhail, DO   100 mg at 06/03/15 9798  . feeding supplement (BOOST / RESOURCE BREEZE) liquid 1 Container  1 Container Oral TID BM Albertine Patricia, MD   1 Container at 06/03/15 1000  . heparin lock flush 100 unit/mL  500  Units Intracatheter Once PRN Curt Bears, MD      . heparin lock flush 100 unit/mL  250 Units Intracatheter Once PRN Curt Bears, MD      . ipratropium-albuterol (DUONEB) 0.5-2.5 (3) MG/3ML nebulizer solution 3 mL  3 mL Nebulization Q4H PRN Maryann Mikhail, DO      . lactose free nutrition (BOOST PLUS) liquid 237 mL  237 mL Oral BID BM Maryann Mikhail, DO   237 mL at 06/03/15 1000  . LORazepam (ATIVAN) injection 1 mg  1 mg Intravenous Once [employer], DO      . morphine 2 MG/ML injection 2 mg  2 mg Intravenous Q4H PRN Maryann Mikhail, DO   2 mg at 06/01/15 2359  . ondansetron (ZOFRAN) tablet 4 mg  4 mg Oral Q6H PRN Maryann Mikhail, DO       Or  . ondansetron (ZOFRAN) injection 4 mg  4 mg Intravenous Q6H PRN Maryann Mikhail, DO      . oxyCODONE (Oxy IR/ROXICODONE) immediate release tablet 5-10 mg  5-10 mg Oral Q4H PRN Maryann Mikhail, DO   10 mg at 05/31/15 2012  . polyethylene glycol (MIRALAX / GLYCOLAX) packet 17 g  17 g Oral Daily PRN Maryann Mikhail, DO      . senna (SENOKOT) tablet 8.6 mg  1 tablet Oral Daily PRN Maryann Mikhail, DO      . sodium chloride flush (NS) 0.9 % injection 10 mL  10 mL Intracatheter PRN Curt Bears, MD      . sodium chloride flush (NS) 0.9 % injection 3 mL  3 mL Intravenous PRN Curt Bears, MD         Discharge Medications: Please see discharge summary for a list of discharge medications.  Relevant Imaging Results:  Relevant Lab Results:   Additional Information SSN: [id number] Pt currently undergoing chemotherapy treatment-received first treatment 4/16 and next treatment will be in 3 weeks at outpatient cancer center.   KIDD, SUZANNA A, LCSW

## 2015-06-03 NOTE — Clinical Social Work Note (Signed)
Clinical Social Work Assessment  Patient Details  Name: Shane Vasquez MRN: [id number] Date of Birth: [birth year]  Date of referral:  06/03/15               Reason for consult:  Discharge Planning                Permission sought to share information with:  Family Supports Permission granted to share information::  Yes, Verbal Permission Granted  Name::     [name]  Agency::     Relationship::  wife  Contact Information:  [phone]  Housing/Transportation Living arrangements for the past 2 months:  Single Family Home Source of Information:  Spouse Patient Interpreter Needed:  None Criminal Activity/Legal Involvement Pertinent to Current Situation/Hospitalization:  No - Comment as needed Significant Relationships:  Spouse Lives with:  Spouse Do you feel safe going back to the place where you live?    Need for family participation in patient care:  Yes (Comment)  Care giving concerns:  Pt admitted from home with wife. PT recommending SNF.    Social Worker assessment / plan:  CSW received referral for New SNF.  CSW met with pt wife and pt brother at bedside. Pt sleeping at this time. CSW introduced self and explained role. Pt wife agreeable to [facility] search. Pt family expressed interest in [place].   [place] completed FL2 and initiated SNF search to [facility]. CSW notified Ritta Slot of interest and they will review information.   CSW to follow up with SNF bed offers.   Employment status:  Retired Forensic scientist:  Medicare PT Recommendations:  [place] / Referral to community resources:  [place]  Patient/Family's Response to care:  Pt sleeping at time of visit. Pt family supportive and actively involved in pt care. Pt family agreeable to SNF.  Patient/Family's Understanding of and Emotional Response to Diagnosis, Current Treatment, and Prognosis:  Pt wife familiar with pt diagnosis and treatment plan for  further chemo.   Emotional Assessment Appearance:  Appears stated age Attitude/Demeanor/Rapport:  Other (pt sleeping) Affect (typically observed):  Other (pt sleeping) Orientation:  Oriented to Self, Oriented to Place, Oriented to  Time, Oriented to Situation Alcohol / Substance use:  Not Applicable Psych involvement (Current and /or in the community):  No (Comment)  Discharge Needs  Concerns to be addressed:  Discharge Planning Concerns Readmission within the last 30 days:  No Current discharge risk:  Physical Impairment Barriers to Discharge:  Continued Medical Work up   Alison Murray A, [facility] 06/03/2015, 4:15 PM  [phone]

## 2015-06-03 NOTE — NC FL2 (Deleted)
[place] MEDICAID FL2 LEVEL OF CARE SCREENING TOOL     IDENTIFICATION  Patient Name: Shane Vasquez Birthdate: [birth year] Sex: male Admission Date (Current Location): 05/29/2015  [facility] and [place] Number:  Herbalist and Address:  [facility],  [place] [address], [employer]      Provider Number: [id number]  Attending Physician Name and Address:  Albertine Patricia, MD  Relative Name and Phone Number:       Current Level of Care: Hospital Recommended Level of Care: [place] Prior Approval Number:    Date Approved/Denied:   PASRR Number:    Discharge Plan: SNF    Current Diagnoses: Patient Active Problem List   Diagnosis Date Noted  . Protein-calorie malnutrition, severe 05/31/2015  . Hyperuricemia   . Metastatic carcinoma ([place])   . Small cell carcinoma of lung ([place]) 05/29/2015  . 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 Lung mass   . Metastatic lung cancer (metastasis from lung to other site) [facility])   . Acute respiratory failure with hypoxia ([place]) 05/18/2015  . Shortness of breath 05/18/2015  . Pleural effusion 05/18/2015  . Leukocytosis 05/18/2015  . Acute respiratory failure ([place]) 05/18/2015  . Liver lesion 05/18/2015  . Elevated LFTs 05/18/2015  . Neoplasm of lung   . Enlarged lymph nodes   . Anemia, iron deficiency 08/01/2014  . Empyema ([place])   . Left lower lobe pneumonia 07/21/2014  . Community acquired pneumonia 07/21/2014  . CERVICAL RADICULOPATHY, RIGHT 08/12/2009  . INTERNAL HEMORRHOIDS 02/29/2008  . Dyslipidemia 08/15/2006  . Essential hypertension 08/15/2006  . GERD 08/15/2006  . Diverticulosis of large intestine 08/15/2006  . SKIN CANCER, HX OF 08/15/2006    Orientation RESPIRATION BLADDER Height & Weight     Self, Time, Situation, Place  O2 (3L O2) Incontinent, External catheter Weight: 167 lb 12.3 oz (76.1 kg) Height:  '5\' 9"'$  (175.3 cm)  BEHAVIORAL SYMPTOMS/MOOD  NEUROLOGICAL BOWEL NUTRITION STATUS   (NONE)  (NONE) Continent Diet (Diet Regular)  AMBULATORY STATUS COMMUNICATION OF NEEDS Skin   Extensive Assist Verbally Normal                       Personal Care Assistance Level of Assistance  Bathing, Feeding, Dressing Bathing Assistance: Limited assistance Feeding assistance: Independent Dressing Assistance: Limited assistance     Functional Limitations Info  Sight, Hearing, Speech Sight Info: Adequate Hearing Info: Adequate Speech Info: Adequate    SPECIAL CARE FACTORS FREQUENCY  PT (By licensed PT), OT (By licensed OT)     PT Frequency: 5 x a week OT Frequency: 5 x a week            Contractures Contractures Info: Not present    Additional Factors Info  Code Status, Allergies Code Status Info: DNR code status Allergies Info: Bee Venom           Current Medications (06/03/2015):  This is the current hospital active medication list Current Facility-Administered Medications  Medication Dose Route Frequency Provider Last Rate Last Dose  . 0.9 %  sodium chloride infusion   Intravenous Continuous Heath Lark, MD 50 mL/hr at 06/02/15 1448    . acetaminophen (TYLENOL) tablet 650 mg  650 mg Oral Q6H PRN Maryann Mikhail, DO       Or  . acetaminophen (TYLENOL) suppository 650 mg  650 mg Rectal Q6H PRN Maryann Mikhail, DO      . alteplase (CATHFLO ACTIVASE) injection 2  mg  2 mg Intracatheter Once PRN Curt Bears, MD      . chlorproMAZINE (THORAZINE) injection 25 mg  25 mg Intramuscular TID PRN Maryann Mikhail, DO   25 mg at 06/02/15 0125  . citalopram (CELEXA) tablet 40 mg  40 mg Oral Daily Maryann Mikhail, DO   40 mg at 06/03/15 0958  . docusate sodium (COLACE) capsule 100 mg  100 mg Oral Daily Maryann Mikhail, DO   100 mg at 06/03/15 4010  . feeding supplement (BOOST / RESOURCE BREEZE) liquid 1 Container  1 Container Oral TID BM Albertine Patricia, MD   1 Container at 06/03/15 1000  . heparin lock flush 100 unit/mL  500  Units Intracatheter Once PRN Curt Bears, MD      . heparin lock flush 100 unit/mL  250 Units Intracatheter Once PRN Curt Bears, MD      . ipratropium-albuterol (DUONEB) 0.5-2.5 (3) MG/3ML nebulizer solution 3 mL  3 mL Nebulization Q4H PRN Maryann Mikhail, DO      . lactose free nutrition (BOOST PLUS) liquid 237 mL  237 mL Oral BID BM Maryann Mikhail, DO   237 mL at 06/03/15 1000  . LORazepam (ATIVAN) injection 1 mg  1 mg Intravenous Once [employer], DO      . morphine 2 MG/ML injection 2 mg  2 mg Intravenous Q4H PRN Maryann Mikhail, DO   2 mg at 06/01/15 2359  . ondansetron (ZOFRAN) tablet 4 mg  4 mg Oral Q6H PRN Maryann Mikhail, DO       Or  . ondansetron (ZOFRAN) injection 4 mg  4 mg Intravenous Q6H PRN Maryann Mikhail, DO      . oxyCODONE (Oxy IR/ROXICODONE) immediate release tablet 5-10 mg  5-10 mg Oral Q4H PRN Maryann Mikhail, DO   10 mg at 05/31/15 2012  . polyethylene glycol (MIRALAX / GLYCOLAX) packet 17 g  17 g Oral Daily PRN Maryann Mikhail, DO      . senna (SENOKOT) tablet 8.6 mg  1 tablet Oral Daily PRN Maryann Mikhail, DO      . sodium chloride flush (NS) 0.9 % injection 10 mL  10 mL Intracatheter PRN Curt Bears, MD      . sodium chloride flush (NS) 0.9 % injection 3 mL  3 mL Intravenous PRN Curt Bears, MD         Discharge Medications: Please see discharge summary for a list of discharge medications.  Relevant Imaging Results:  Relevant Lab Results:   Additional Information SSN: [id number]  Shane Vasquez, [place] Better A, LCSW

## 2015-06-03 NOTE — Consult Note (Signed)
   [facility] CM Inpatient Consult   06/03/2015  Shane Vasquez 11/26/1950 [id number]   Patient screened for potential [facility] Care Management services. Chart reviewed. Noted disposition plans are for SNF. [facility] Care Management services not appropriate at this time. If patient's post hospital needs change please place a [facility] Care Management consult. For questions please contact:  Marthenia Rolling, [place], RN,BSN [facility] Liaison [phone]

## 2015-06-03 NOTE — Clinical Social Work Placement (Signed)
   CLINICAL SOCIAL WORK PLACEMENT  NOTE  Date:  06/03/2015  Patient Details  Name: Shane Vasquez MRN: [id number] Date of Birth: [birth year]  Clinical Social Work is seeking post-discharge placement for this patient at the [place] level of care (*CSW will initial, date and re-position this form in  chart as items are completed):  Yes   Patient/family provided with [place] Work Department's list of facilities offering this level of care within the geographic area requested by the patient (or if unable, by the patient's family).  Yes   Patient/family informed of their freedom to choose among providers that offer the needed level of care, that participate in Medicare, Medicaid or managed care program needed by the patient, have an available bed and are willing to accept the patient.  Yes   Patient/family informed of [place]'s ownership interest in [facility] and [facility], as well as of the fact that they are under no obligation to receive care at these facilities.  PASRR submitted to EDS on 06/03/15     PASRR number received on 06/03/15     Existing PASRR number confirmed on       FL2 transmitted to all facilities in geographic area requested by pt/family on 06/03/15     FL2 transmitted to all facilities within larger geographic area on       Patient informed that his/her managed care company has contracts with or will negotiate with certain facilities, including the following:        Yes   Patient/family informed of bed offers received.  Patient [facility] bed at [facility]     Physician recommends and patient chooses bed at      Patient to be transferred to [facility] on  .  Patient to be transferred to facility by       Patient family notified on   of transfer.  Name of family member notified:        PHYSICIAN Please sign FL2, Please sign DNR     Additional Comment:     _______________________________________________ Ladell Pier, LCSW 06/03/2015, 4:19 PM

## 2015-06-03 NOTE — Care Management Important Message (Signed)
Important Message  Patient Details  Name: ROBERTH BERLING MRN: [id number] Date of Birth: [birth year]   Medicare Important Message Given:  Yes    Camillo Flaming 06/03/2015, 9:17 AMImportant Message  Patient Details  Name: HENDRICK PAVICH MRN: [id number] Date of Birth: [birth year]   Medicare Important Message Given:  Yes    Camillo Flaming 06/03/2015, 9:16 AM

## 2015-06-04 DIAGNOSIS — I10 Essential (primary) hypertension: Secondary | ICD-10-CM

## 2015-06-04 DIAGNOSIS — D72829 Elevated white blood cell count, unspecified: Secondary | ICD-10-CM

## 2015-06-04 DIAGNOSIS — R7989 Other specified abnormal findings of blood chemistry: Secondary | ICD-10-CM

## 2015-06-04 MED ORDER — OXYCODONE HCL 5 MG PO TABS: ORAL_TABLET | ORAL | Status: DC

## 2015-06-04 MED ORDER — CITALOPRAM HYDROBROMIDE 40 MG PO TABS: 20.0000 mg | ORAL_TABLET | Freq: Every day | ORAL | Status: DC

## 2015-06-04 MED ORDER — HYDROCHLOROTHIAZIDE 12.5 MG PO TABS: 12.5000 mg | ORAL_TABLET | Freq: Every day | ORAL | Status: DC

## 2015-06-04 MED ORDER — SENNA 8.6 MG PO TABS: 1.0000 | ORAL_TABLET | Freq: Every day | ORAL | Status: AC

## 2015-06-04 MED ORDER — BOOST PLUS PO LIQD: 237.0000 mL | Freq: Two times a day (BID) | ORAL | Status: DC

## 2015-06-04 NOTE — Discharge Summary (Addendum)
Physician Discharge Summary  Shane Vasquez ZHG:[id number] DOB: 22-Aug-1950 DOA: 05/29/2015  PCP: [name], MD  Admit date: 05/29/2015 Discharge date: 06/04/2015  Time spent: 45 minutes  Recommendations for Outpatient Follow-up:  1. Dr.Mohamed in 1 week 2. 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 [facility])   Essential hypertension   Leukocytosis   Liver lesion   Elevated LFTs   Metastatic lung cancer (metastasis from lung to other site) [facility])   Metastatic carcinoma (HCC)   Protein-calorie malnutrition, severe   Hyperuricemia   Discharge Condition: stable  Diet recommendation: low sodium, high protein  Filed Weights   06/01/15 0512 06/03/15 0522 06/04/15 0500  Weight: 71.714 kg (158 lb 1.6 oz) 76.1 kg (167 lb 12.3 oz) 79.6 kg (175 lb 7.8 oz)    History of present illness:  Chief Complaint: Poor oral intake, dehydration HPI: LUC SHAMMAS is a 65 y.o. male with a history of COPD, hypertension recently diagnosed small cell lung cancer with metastasis, that presented to Dr. Worthy Flank office (oncology) 4/13 and was found to have fatigue as well as weakness with poor oral intake and weight loss. Patient has also complained of having the hiccups and reported back pain as well as abdominal pain and nausea. Patient directly admitted from Dr. Worthy Flank office for palliative chemotherapy.  Hospital Course:  Metastatic small cell lung carcinoma to liver -Patient with failure to thrive, Oncology consulted  -Given first cycle of Palliative chemotherapy 4/14-4/16 with reduced dose carboplatin and etoposide  -tolerated chemo relatively well expect went into fluid overload due to aggressive IVF, now improved -MRI brain with no evidence of brain metastasis, but significant for metastatic deposit in Clivus and C2 vertebral body -very  deconditioned and poor Po intake, plan for SNF for rehab today -will need weekly CBC and CMet checked and faxed to Dr.Mohamed's office  Dyspnea  -Secondary to volume overload. (no history of CHF) and Lung Ca -patient was receiving aggressive IV hydration s/p chemo for hyperuricemia -Was Rxed with nebs, O2, and lasix -now resolved  Acute kidney injury vs Chronic kidney disease, stage III -Secondary dehydration and poor oral intake -Upon admission, Creatinine 1.8, improved to 1.29 -HCTZ was on hold, resumed at lower dose  Abdominal pain with nausea/ elevated liver enzymes -Continue pain control and antiemetics as needed -LFTs elevated, likely secondary to liver metastasis -last Bm yesterday continue laxatives  Thrombocytopenia -Likely secondary to the above/chemo -no evidence of bleeding, monitor CBC periodically  Leukocytosis -Possibly reactive  -resolved   Hyperuricemia -Improved, due to tumor lysis -Given rasburicase by oncology  Essential hypertension -Pressure had been soft.  -now improved, resume HCTZ at lower dose  Depression -Continue Celexa, dose decreased to '20mg'$  due to age >78  Severe malnutrition/FTT -Likely secondary to the above -nutrition consulted, supplements advised  Code Status: DNR   Consultations:  Onc Dr.Mohamed  Discharge Exam: Filed Vitals:   06/03/15 1900 06/04/15 0500  BP: 108/60 114/60  Pulse: 89   Temp: 98.3 F (36.8 C) 97.9 F (36.6 C)  Resp: 18 20    General: AAOx3, cachectic Cardiovascular: S1S2/RRR Respiratory: decreased BS at bases  Discharge Instructions   Discharge Instructions    Diet - low sodium heart healthy    Complete by:  As directed      Increase activity slowly    Complete by:  As directed      TREATMENT CONDITIONS    Complete by:  As directed   Patient should have CBC & CMP within 7 days prior to chemotherapy administration. NOTIFY MD IF: ANC < 1500, Hemoglobin < 8, PLT < 100,000,  Total Bili > 1.5,  Creatinine > 1.5, ALT & AST > 80 or if patient has unstable vital signs: Temperature > 38.5, SBP > 180 or < 90, RR > 30 or HR > 100.          Current Discharge Medication List    START taking these medications   Details  lactose free nutrition (BOOST PLUS) LIQD Take 237 mLs by mouth 2 (two) times daily between meals. Refills: 0      CONTINUE these medications which have CHANGED   Details  citalopram (CELEXA) 40 MG tablet Take 0.5 tablets (20 mg total) by mouth daily.    hydrochlorothiazide (HYDRODIURIL) 12.5 MG tablet Take 1 tablet (12.5 mg total) by mouth daily.   Associated Diagnoses: Small cell carcinoma of lung, right (HCC)    oxyCODONE (ROXICODONE) 5 MG immediate release tablet TAKE 1-2 TABLETS BY MOUTH EVERY 4-6 HOURS AS NEEDED FOR SEVERE PAIN Qty: 20 tablet, Refills: 0    senna (SENOKOT) 8.6 MG TABS tablet Take 1 tablet (8.6 mg total) by mouth daily. Refills: 0      CONTINUE these medications which have NOT CHANGED   Details  EPINEPHrine 0.3 mg/0.3 mL IJ SOAJ injection Inject 0.3 mLs (0.3 mg total) into the muscle once. Qty: 1 Device, Refills: 2    ondansetron (ZOFRAN) 4 MG tablet Take 1 tablet (4 mg total) by mouth every 8 (eight) hours as needed for nausea or vomiting. Qty: 30 tablet, Refills: 0    polyethylene glycol (MIRALAX / GLYCOLAX) packet Take 17 g by mouth daily as needed for mild constipation.      STOP taking these medications     docusate sodium (COLACE) 100 MG capsule        Allergies  Allergen Reactions  . Bee Venom Anaphylaxis and Nausea And Vomiting   Follow-up Information    Follow up with [facility] K., MD In 1 week.   Specialty:  Oncology   Contact information:   [address] [place] [address] [phone]        The results of significant diagnostics from this hospitalization (including imaging, microbiology, ancillary and laboratory) are listed below for reference.    Significant Diagnostic Studies: Ct Abdomen  Pelvis Wo Contrast  05/18/2015  CLINICAL DATA:  65 year old male with abdominal pelvic pain, nausea and right lower lung opacity identified on recent chest radiograph. EXAM: CT CHEST, ABDOMEN AND PELVIS WITHOUT CONTRAST TECHNIQUE: Multidetector CT imaging of the chest, abdomen and pelvis was performed following the standard protocol without IV contrast. COMPARISON:  05/18/2015 and prior radiographs.  07/26/2014 chest CT. FINDINGS: CT CHEST Mediastinum/Nodes: Multiple enlarged mediastinal and right hilar lymph nodes are identified. Index nodes include the following: A 3.8 x 3.8 cm right peritracheal node (image 25). A 3.4 x 4.6 cm sub carinal node (image 33). A 1.7 cm right hilar node (image 32). There is no evidence of pericardial effusion. Ectasia of the ascending aorta measuring 3.5 cm noted. Lungs/Pleura: An ill-defined 7 x 9 cm mass within the central right middle/ lower lobes identified extending to the right hilum. Nodular interstitial thickening within the right lower lobe identified. Ill-defined ground-glass and nodular opacities within the right upper lobe are identified. A small right pleural effusion  is noted with pleural nodularity. 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 There is no evidence of left pleural effusion. Emphysema is identified. Musculoskeletal: No acute or suspicious abnormalities identified. CT ABDOMEN AND PELVIS Please note that parenchymal abnormalities may be missed without intravenous contrast. Hepatobiliary: Multiple faint hypodense lesions within the liver are noted. The largest lesion measures 1.5 cm and anterior liver (image 57). The gallbladder is unremarkable. There is no evidence of biliary dilatation. Pancreas: Unremarkable Spleen: Unremarkable Adrenals/Urinary Tract: Mild bilateral renal atrophy noted. No definite mass, hydronephrosis urinary calculi noted. The bladder and adrenal glands are unremarkable. Stomach/Bowel:  Colonic diverticulosis noted without diverticulitis. No evidence bowel obstruction or definite focal bowel wall thickening. Vascular/Lymphatic: Enlarged retroperitoneal and upper abdominal lymph nodes identified. An index 1.8 x 2.1 cm right retroperitoneal node (image 68) noted. Aortic atherosclerotic calcifications noted without aneurysm. Reproductive: Mild prostate enlargement noted. Other: No free fluid, pneumoperitoneum or focal collection. Musculoskeletal: No acute or suspicious abnormalities identified. IMPRESSION: 7 x 9 cm central right middle lobe/lower lobe mass compatible with malignancy. Right lower lobe nodular interstitial thickening,, right upper lobe nodules, right pleural nodules with small right pleural effusion, enlarged mediastinal, right hilar and upper abdominal lymph nodes, and ill-defined hepatic lesions- compatible with metastatic disease. Electronically Signed   By: Margarette Canada M.D.   On: 05/18/2015 14:51   Dg Chest 1 View  05/19/2015  CLINICAL DATA:  Status post right-sided thoracentesis EXAM: CHEST 1 VIEW COMPARISON:  May 18, 2015 chest CT and chest radiograph FINDINGS: No pneumothorax. There has been near complete resolution of pleural effusion on the right. There is persistent right middle mass with adjacent lobe airspace consolidation. There is scarring and atelectatic change in the left base. The heart size and pulmonary vascularity are within normal limits. Multiple foci of adenopathy are noted throughout the mediastinum superiorly and on the right. IMPRESSION: No pneumothorax. Essentially complete resolution of pleural effusion on the right. Mass with airspace consolidation remains on the right, unchanged. Extensive adenopathy present. Scarring and atelectatic change are noted in the left base. Electronically Signed   By: Lowella Grip III M.D.   On: 05/19/2015 14:03   Dg Chest 2 View  05/29/2015  CLINICAL DATA:  COPD, hypertension, recent diagnosis of small cell lung  cancer with metastasis, presented today with fatigue, weakness, weight loss and poor oral intake, hiccups, back pain, abdominal pain, nausea EXAM: CHEST  2 VIEW COMPARISON:  05/19/2015 FINDINGS: Normal heart size and pulmonary vascularity. Mediastinal enlargement corresponding to extensive adenopathy on prior CT. Increased opacity at RIGHT lung base consistent with known tumor, increased atelectasis and pleural effusion. Underlying emphysematous and bronchitic changes. Subsegmental atelectasis and scarring at LEFT base persists. No pneumothorax. Bones demineralized. IMPRESSION: COPD changes with increased RIGHT basilar opacity corresponding to known tumor which increased atelectasis and pleural effusion. Mediastinal adenopathy. Electronically Signed   By: Lavonia Dana M.D.   On: 05/29/2015 19:59   Dg Chest 2 View  05/18/2015  CLINICAL DATA:  Shortness of breath and dry cough for 1-1/2 weeks. Former smoker for 50 years. EXAM: CHEST  2 VIEW COMPARISON:  Chest x-rays dated 01/02/2015 and 09/19/2014. FINDINGS: New ill-defined airspace opacity within the anterior aspect of the right lower lobe, extending to the right hilum. Chronic scarring/ atelectasis noted at the left lung base. Lungs are hyperexpanded indicating some degree of COPD. Coarse interstitial markings are seen within both lungs which is likely a related chronic interstitial lung disease. Heart size is  upper normal. IMPRESSION: New dense ill-defined airspace opacity within the right lower lobe, extending to the right hilum. Additional vague density at the right hilum and within the overlying medial suprahilar region. This could represent pneumonia or airspace collapse related to a central obstructing mass. Recommend chest CT with contrast for further characterization. Hyperexpanded lungs indicating COPD/emphysema. Suspect associated chronic interstitial lung disease bilaterally. Electronically Signed   By: Franki Cabot M.D.   On: 05/18/2015 12:37   Dg  Abd 1 View  05/18/2015  CLINICAL DATA:  Lower abdominal pain, constipation. EXAM: ABDOMEN - 1 VIEW COMPARISON:  None. FINDINGS: The bowel gas pattern is normal. Phleboliths are noted in the pelvis. Inferior tip of liver extends iliac wing suggesting hepatomegaly. IMPRESSION: No evidence of bowel obstruction or ileus.  Possible hepatomegaly. Electronically Signed   By: Marijo Conception, M.D.   On: 05/18/2015 11:20   Ct Chest Wo Contrast  05/18/2015  CLINICAL DATA:  65 year old male with abdominal pelvic pain, nausea and right lower lung opacity identified on recent chest radiograph. EXAM: CT CHEST, ABDOMEN AND PELVIS WITHOUT CONTRAST TECHNIQUE: Multidetector CT imaging of the chest, abdomen and pelvis was performed following the standard protocol without IV contrast. COMPARISON:  05/18/2015 and prior radiographs.  07/26/2014 chest CT. FINDINGS: CT CHEST Mediastinum/Nodes: Multiple enlarged mediastinal and right hilar lymph nodes are identified. Index nodes include the following: A 3.8 x 3.8 cm right peritracheal node (image 25). A 3.4 x 4.6 cm sub carinal node (image 33). A 1.7 cm right hilar node (image 32). There is no evidence of pericardial effusion. Ectasia of the ascending aorta measuring 3.5 cm noted. Lungs/Pleura: An ill-defined 7 x 9 cm mass within the central right middle/ lower lobes identified extending to the right hilum. Nodular interstitial thickening within the right lower lobe identified. Ill-defined ground-glass and nodular opacities within the right upper lobe are identified. A small right pleural effusion is noted with pleural nodularity. 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 There is no evidence of left pleural effusion. Emphysema is identified. Musculoskeletal: No acute or suspicious abnormalities identified. CT ABDOMEN AND PELVIS Please note that parenchymal abnormalities may be missed without intravenous contrast. Hepatobiliary:  Multiple faint hypodense lesions within the liver are noted. The largest lesion measures 1.5 cm and anterior liver (image 57). The gallbladder is unremarkable. There is no evidence of biliary dilatation. Pancreas: Unremarkable Spleen: Unremarkable Adrenals/Urinary Tract: Mild bilateral renal atrophy noted. No definite mass, hydronephrosis urinary calculi noted. The bladder and adrenal glands are unremarkable. Stomach/Bowel: Colonic diverticulosis noted without diverticulitis. No evidence bowel obstruction or definite focal bowel wall thickening. Vascular/Lymphatic: Enlarged retroperitoneal and upper abdominal lymph nodes identified. An index 1.8 x 2.1 cm right retroperitoneal node (image 68) noted. Aortic atherosclerotic calcifications noted without aneurysm. Reproductive: Mild prostate enlargement noted. Other: No free fluid, pneumoperitoneum or focal collection. Musculoskeletal: No acute or suspicious abnormalities identified. IMPRESSION: 7 x 9 cm central right middle lobe/lower lobe mass compatible with malignancy. Right lower lobe nodular interstitial thickening,, right upper lobe nodules, right pleural nodules with small right pleural effusion, enlarged mediastinal, right hilar and upper abdominal lymph nodes, and ill-defined hepatic lesions- compatible with metastatic disease. Electronically Signed   By: Margarette Canada M.D.   On: 05/18/2015 14:51   Mr Jeri Cos WP Contrast  06/03/2015  CLINICAL DATA:  Metastatic lung cancer.  Staging. EXAM: MRI HEAD WITHOUT AND WITH CONTRAST TECHNIQUE: Multiplanar, multiecho pulse sequences of the brain and surrounding structures were obtained  without and with intravenous contrast. CONTRAST:  46m MULTIHANCE GADOBENATE DIMEGLUMINE 529 MG/ML IV SOLN COMPARISON:  None. FINDINGS: Ventricle size normal.  Cerebral volume normal for age. Negative for acute infarct. Scattered small nonenhancing white matter hyperintensities bilaterally most consistent with chronic microvascular  ischemia. Negative for intracranial hemorrhage. Negative for edema in the brain. Postcontrast imaging reveals no enhancing brain lesions. Vascular enhancement normal. Leptomeningeal enhancement normal. Bony lesions are present in the clivus and C2 vertebral body which enhance and are consistent with metastatic disease. No other skull lesions identified. IMPRESSION: Metastatic deposits in the clivus and C2 vertebral body. Negative for metastatic disease to the brain. Electronically Signed   By: CFranchot GalloM.D.   On: 06/03/2015 07:17   UKoreaThoracentesis Asp Pleural Space W/img Guide  05/19/2015  INDICATION: Newly diagnosed right lung mass with associated pleural effusion. Request diagnostic and therapeutic thoracentesis EXAM: ULTRASOUND GUIDED RIGHT THORACENTESIS MEDICATIONS: None. COMPLICATIONS: None immediate. PROCEDURE: An ultrasound guided thoracentesis was thoroughly discussed with the patient and questions answered. The benefits, risks, alternatives and complications were also discussed. The patient understands and wishes to proceed with the procedure. Written consent was obtained. Ultrasound was performed to localize and mark an adequate pocket of fluid in the right chest. The area was then prepped and draped in the normal sterile fashion. 1% Lidocaine was used for local anesthesia. Under ultrasound guidance a Safe-T-Centesis catheter was introduced. Thoracentesis was performed. The catheter was removed and a dressing applied. FINDINGS: A total of approximately 830 mL of hazy, dark yellow fluid was removed. Samples were sent to the laboratory as requested by the clinical team. Limited ultrasound imaging of the liver during this procedure finds numerous lesions suspicious for metastatic disease. IMPRESSION: Successful ultrasound guided right thoracentesis yielding 830 mL of pleural fluid. Read by: KAscencion DikePA-C Electronically Signed   By: DLucrezia EuropeM.D.   On: 05/19/2015 14:26     Microbiology: Recent Results (from the past 240 hour(s))  TECHNOLOGIST REVIEW     Status: None   Collection Time: 05/29/15  1:13 PM  Result Value Ref Range Status   Technologist Review 5% myelocytes  Final     Labs: Basic Metabolic Panel:  Recent Labs Lab 05/30/15 0341 05/31/15 0340 06/01/15 0410 06/02/15 0334 06/03/15 0332  NA 136 138 139 136 137  K 4.7 4.9 4.9 5.0 4.3  CL 99* 104 107 107 108  CO2 20* 18* 19* 18* 18*  GLUCOSE 100* 141* 121* 109* 119*  BUN 83* 99* 98* 100* 89*  CREATININE 1.80* 1.77* 1.58* 1.32* 1.29*  CALCIUM 9.5 8.9 8.6* 8.1* 8.0*   Liver Function Tests:  Recent Labs Lab 05/29/15 1313 05/30/15 0341 05/31/15 0340 06/01/15 0410 06/02/15 0334  AST 610* 526* 637* 609* 585*  ALT 139* 120* 134* 129* 121*  ALKPHOS 923* 736* 775* 779* 757*  BILITOT 3.73* 4.0* 4.7* 4.1* 4.5*  PROT 6.1* 5.5* 5.6* 5.5* 5.1*  ALBUMIN 2.0* 2.1* 2.1* 2.1* 1.9*   No results for input(s): LIPASE, AMYLASE in the last 168 hours. No results for input(s): AMMONIA in the last 168 hours. CBC:  Recent Labs Lab 05/29/15 1313 05/30/15 0341 05/31/15 0340 06/01/15 0410 06/02/15 0334 06/03/15 0332  WBC 16.0* 15.6* 17.5* 16.3* 11.9* 9.6  NEUTROABS 12.2*  --   --   --   --   --   HGB 11.3* 9.9* 9.7* 9.6* 9.1* 8.3*  HCT 32.5* 28.3* 27.8* 28.2* 26.2* 24.0*  MCV 91.3 90.7 90.8 90.1 90.7 89.2  PLT 78  Large platelets present* 72* 76* 69* 62* 56*   Cardiac Enzymes: No results for input(s): CKTOTAL, CKMB, CKMBINDEX, TROPONINI in the last 168 hours. BNP: BNP (last 3 results) No results for input(s): BNP in the last 8760 hours.  ProBNP (last 3 results) No results for input(s): PROBNP in the last 8760 hours.  CBG: No results for input(s): GLUCAP in the last 168 hours.     SignedDomenic Polite MD.  [facility] 06/04/2015, 9:56 AM

## 2015-06-04 NOTE — Progress Notes (Signed)
CSW received notification from pt wife that choice for SNF is [facility].   CSW confirmed with Ritta Slot that private room available today.  CSW facilitated pt discharge needs including contacting facility, faxing pt discharge information via epic hub, discussing with pt wife and pt brother, providing RN phone number to call report, and arranging ambulance transport for pt to [facility].  Pt family appreciative of assistance and hopeful that pt will be able to rehab quickly in order to return home.  No further social work needs identified at this time.  CSW signing off.   Alison Murray, MSW, [place] [employer] [phone]

## 2015-06-04 NOTE — [place] FL2 (Signed)
[place] MEDICAID FL2 LEVEL OF CARE SCREENING TOOL     IDENTIFICATION  Patient Name: Shane Vasquez Birthdate: 1950-08-08 Sex: male Admission Date (Current Location): 05/29/2015  [facility] [place] Number:  Herbalist and Address:  [facility],  [facility] [address], [place]      Provider Number: [id number]  Attending Physician Name and Address:  Domenic Polite, MD  Relative Name and Phone Number:       Current Level of Care: Hospital Recommended Level of Care: [place] Prior Approval Number:    Date Approved/Denied:   PASRR Number: [id number]  Discharge Plan: SNF    Current Diagnoses: Patient Active Problem List   Diagnosis Date Noted  . Protein-calorie malnutrition, severe 05/31/2015  . Hyperuricemia   . Metastatic carcinoma ([place])   . Small cell carcinoma of lung ([place]) 05/29/2015  . 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 Lung mass   . Metastatic lung cancer (metastasis from lung to other site) [facility])   . Acute respiratory failure with hypoxia ([place]) 05/18/2015  . Shortness of breath 05/18/2015  . Pleural effusion 05/18/2015  . Leukocytosis 05/18/2015  . Acute respiratory failure ([place]) 05/18/2015  . Liver lesion 05/18/2015  . Elevated LFTs 05/18/2015  . Neoplasm of lung   . Enlarged lymph nodes   . Anemia, iron deficiency 08/01/2014  . Empyema ([place])   . Left lower lobe pneumonia 07/21/2014  . Community acquired pneumonia 07/21/2014  . CERVICAL RADICULOPATHY, RIGHT 08/12/2009  . INTERNAL HEMORRHOIDS 02/29/2008  . Dyslipidemia 08/15/2006  . Essential hypertension 08/15/2006  . GERD 08/15/2006  . Diverticulosis of large intestine 08/15/2006  . SKIN CANCER, HX OF 08/15/2006    Orientation RESPIRATION BLADDER Height & Weight     Self, Time, Situation, Place  O2 (3L O2) Incontinent, External catheter Weight: 175 lb 7.8 oz (79.6 kg) Height:  '5\' 9"'$  (175.3 cm)  BEHAVIORAL SYMPTOMS/MOOD  NEUROLOGICAL BOWEL NUTRITION STATUS   (NONE)  (NONE) Continent Diet (Diet Regular)  AMBULATORY STATUS COMMUNICATION OF NEEDS Skin   Extensive Assist Verbally Normal                       Personal Care Assistance Level of Assistance  Bathing, Feeding, Dressing Bathing Assistance: Limited assistance Feeding assistance: Independent Dressing Assistance: Limited assistance     Functional Limitations Info  Sight, Hearing, Speech Sight Info: Adequate Hearing Info: Adequate Speech Info: Adequate    SPECIAL CARE FACTORS FREQUENCY  PT (By licensed PT), OT (By licensed OT)     PT Frequency: 5 x a week OT Frequency: 5 x a week            Contractures Contractures Info: Not present    Additional Factors Info  Code Status, Allergies Code Status Info: DNR code status Allergies Info: Bee Venom           Current Medications (06/04/2015):  This is the current hospital active medication list Current Facility-Administered Medications  Medication Dose Route Frequency Provider Last Rate Last Dose  . acetaminophen (TYLENOL) tablet 650 mg  650 mg Oral Q6H PRN Maryann Mikhail, DO       Or  . acetaminophen (TYLENOL) suppository 650 mg  650 mg Rectal Q6H PRN Maryann Mikhail, DO      . alteplase (CATHFLO ACTIVASE) injection 2 mg  2 mg Intracatheter Once PRN Curt Bears, MD      . chlorproMAZINE (THORAZINE) injection 25 mg  25 mg  Intramuscular TID PRN Cristal Ford, DO   25 mg at 06/03/15 2022  . citalopram (CELEXA) tablet 40 mg  40 mg Oral Daily Maryann Mikhail, DO   40 mg at 06/04/15 0959  . docusate sodium (COLACE) capsule 100 mg  100 mg Oral Daily Maryann Mikhail, DO   100 mg at 06/04/15 0959  . feeding supplement (BOOST / RESOURCE BREEZE) liquid 1 Container  1 Container Oral TID BM Albertine Patricia, MD   1 Container at 06/03/15 2027  . heparin lock flush 100 unit/mL  500 Units Intracatheter Once PRN Curt Bears, MD      . heparin lock flush 100 unit/mL  250 Units  Intracatheter Once PRN Curt Bears, MD      . ipratropium-albuterol (DUONEB) 0.5-2.5 (3) MG/3ML nebulizer solution 3 mL  3 mL Nebulization Q4H PRN Maryann Mikhail, DO      . lactose free nutrition (BOOST PLUS) liquid 237 mL  237 mL Oral BID BM Maryann Mikhail, DO   237 mL at 06/03/15 1000  . LORazepam (ATIVAN) injection 1 mg  1 mg Intravenous Once [employer], DO      . morphine 2 MG/ML injection 2 mg  2 mg Intravenous Q4H PRN Maryann Mikhail, DO   2 mg at 06/01/15 2359  . ondansetron (ZOFRAN) tablet 4 mg  4 mg Oral Q6H PRN Maryann Mikhail, DO       Or  . ondansetron (ZOFRAN) injection 4 mg  4 mg Intravenous Q6H PRN Maryann Mikhail, DO      . oxyCODONE (Oxy IR/ROXICODONE) immediate release tablet 5-10 mg  5-10 mg Oral Q4H PRN Maryann Mikhail, DO   10 mg at 05/31/15 2012  . polyethylene glycol (MIRALAX / GLYCOLAX) packet 17 g  17 g Oral Daily PRN Maryann Mikhail, DO      . senna (SENOKOT) tablet 8.6 mg  1 tablet Oral Daily PRN Maryann Mikhail, DO      . sodium chloride flush (NS) 0.9 % injection 10 mL  10 mL Intracatheter PRN Curt Bears, MD      . sodium chloride flush (NS) 0.9 % injection 3 mL  3 mL Intravenous PRN Curt Bears, MD         Discharge Medications: Please see discharge summary for a list of discharge medications.  Relevant Imaging Results:  Relevant Lab Results:   Additional Information SSN: [id number] Pt currently undergoing chemotherapy treatment-received first treatment 4/16 and next treatment will be in 3 weeks at outpatient cancer center.   Tobi Leinweber A, LCSW

## 2015-06-04 NOTE — Progress Notes (Signed)
Physical Therapy Treatment Patient Details Name: Shane Vasquez MRN: [id number] DOB: 29-Dec-1950 Today's Date: 06/04/2015    History of Present Illness Shane Vasquez is a 65 y.o. 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 Worthy Flank office (oncology) today and was found to have fatigue as well as weakness with poor oral intake and weight loss    PT Comments    Pt required less assistance for bed mobility and transfers today. He ambulated 58' with RW and 3L O2. Overall increased tolerance of activity today.   Follow Up Recommendations  SNF;Supervision/Assistance - 24 hour     Equipment Recommendations  Rolling walker with 5" wheels    Recommendations for Other Services       Precautions / Restrictions Precautions Precaution Comments: chemo precautions, on oxygen Restrictions Weight Bearing Restrictions: No    Mobility  Bed Mobility Overal bed mobility: Needs Assistance Bed Mobility: Supine to Sit;Rolling Rolling: Supervision Sidelying to sit: HOB elevated;Min assist       General bed mobility comments: use of rail, extra time,  min A to raise trunk  Transfers Overall transfer level: Needs assistance Equipment used: Rolling walker (2 wheeled) Transfers: Sit to/from Stand Sit to Stand: Min guard         General transfer comment: VCs hand placement, min/guard safety  Ambulation/Gait Ambulation/Gait assistance: Min guard Ambulation Distance (Feet): 30 Feet Assistive device: Rolling walker (2 wheeled)     Gait velocity interpretation: Below normal speed for age/gender General Gait Details: walked with 3L O2 [place], steady, no LOB, distance limited by fatigue, 2/4 dyspnea   Stairs            Wheelchair Mobility    Modified Rankin (Stroke Patients Only)       Balance     Sitting balance-Leahy Scale: Good       Standing balance-Leahy Scale: Fair                       Cognition Arousal/Alertness: Lethargic Behavior During Therapy: Flat affect Overall Cognitive Status: Within Functional Limits for tasks assessed                      Exercises      General Comments        Pertinent Vitals/Pain      Home Living                      Prior Function            PT Goals (current goals can now be found in the care plan section) Acute Rehab PT Goals Patient Stated Goal: to be able to return home PT Goal Formulation: With patient/family Time For Goal Achievement: 06/16/15 Potential to Achieve Goals: Fair Progress towards PT goals: Progressing toward goals    Frequency  Min 3X/week    PT Plan      Co-evaluation             End of Session Equipment Utilized During Treatment: Oxygen;Gait belt Activity Tolerance: Patient tolerated treatment well Patient left: in chair;with call bell/phone within reach;with chair alarm set;with family/visitor present     Time: 1047-1101 PT Time Calculation (min) (ACUTE ONLY): 14 min  Charges:  $Gait Training: 8-22 mins                    G Codes:  Shane Vasquez 06/04/2015, 11:18 AM [phone]

## 2015-06-04 NOTE — Clinical Social Work Placement (Signed)
   CLINICAL SOCIAL WORK PLACEMENT  NOTE  Date:  06/04/2015  Patient Details  Name: Shane Vasquez MRN: [id number] Date of Birth: 05-07-1950  Clinical Social Work is seeking post-discharge placement for this patient at the [place] level of care (*CSW will initial, date and re-position this form in  chart as items are completed):  Yes   Patient/family provided with [facility] Work Department's list of facilities offering this level of care within the geographic area requested by the patient (or if unable, by the patient's family).  Yes   Patient/family informed of their freedom to choose among providers that offer the needed level of care, that participate in Medicare, Medicaid or managed care program needed by the patient, have an available bed and are willing to accept the patient.  Yes   Patient/family informed of [place]'s ownership interest in [facility] and [facility], as well as of the fact that they are under no obligation to receive care at these facilities.  PASRR submitted to EDS on 06/03/15     PASRR number received on 06/03/15     Existing PASRR number confirmed on       FL2 transmitted to all facilities in geographic area requested by pt/family on 06/03/15     FL2 transmitted to all facilities within larger geographic area on       Patient informed that his/her managed care company has contracts with or will negotiate with certain facilities, including the following:        Yes   Patient/family informed of bed offers received.  Patient chooses bed at [facility]     Physician recommends and patient chooses bed at      Patient to be transferred to [facility] on 06/04/15.  Patient to be transferred to facility by ambulance Corey [name])     Patient family notified on 06/04/15 of transfer.  Name of family member notified:  pt wife notified.      PHYSICIAN Please sign FL2, Please sign DNR      Additional Comment:    _______________________________________________ Ladell Pier, LCSW 06/04/2015, 12:55 PM

## 2015-06-09 ENCOUNTER — Telehealth: Payer: Self-pay | Admitting: Internal Medicine

## 2015-06-09 ENCOUNTER — Other Ambulatory Visit (HOSPITAL_BASED_OUTPATIENT_CLINIC_OR_DEPARTMENT_OTHER): Payer: Medicare Other

## 2015-06-09 ENCOUNTER — Other Ambulatory Visit: Payer: Self-pay | Admitting: Medical Oncology

## 2015-06-09 ENCOUNTER — Telehealth: Payer: Self-pay | Admitting: Medical Oncology

## 2015-06-09 ENCOUNTER — Ambulatory Visit (HOSPITAL_BASED_OUTPATIENT_CLINIC_OR_DEPARTMENT_OTHER): Payer: Medicare Other | Admitting: Internal Medicine

## 2015-06-09 ENCOUNTER — Ambulatory Visit (HOSPITAL_COMMUNITY)
Admission: RE | Admit: 2015-06-09 | Discharge: 2015-06-09 | Disposition: A | Discharge status: Home / Self Care | Payer: No Typology Code available for payment source | Source: Ambulatory Visit | Attending: Internal Medicine | Admitting: Internal Medicine

## 2015-06-09 ENCOUNTER — Encounter: Payer: Self-pay | Admitting: Internal Medicine

## 2015-06-09 VITALS — BP 103/77 | HR 88 | Temp 98.9°F | Resp 18 | Ht 69.0 in | Wt 166.7 lb

## 2015-06-09 DIAGNOSIS — C342 Malignant neoplasm of middle lobe, bronchus or lung: Secondary | ICD-10-CM

## 2015-06-09 DIAGNOSIS — C786 Secondary malignant neoplasm of retroperitoneum and peritoneum: Secondary | ICD-10-CM | POA: Diagnosis not present

## 2015-06-09 DIAGNOSIS — D649 Anemia, unspecified: Secondary | ICD-10-CM

## 2015-06-09 DIAGNOSIS — C3491 Malignant neoplasm of unspecified part of right bronchus or lung: Secondary | ICD-10-CM

## 2015-06-09 DIAGNOSIS — C3431 Malignant neoplasm of lower lobe, right bronchus or lung: Secondary | ICD-10-CM

## 2015-06-09 DIAGNOSIS — R634 Abnormal weight loss: Secondary | ICD-10-CM

## 2015-06-09 DIAGNOSIS — C787 Secondary malignant neoplasm of liver and intrahepatic bile duct: Secondary | ICD-10-CM | POA: Diagnosis not present

## 2015-06-09 DIAGNOSIS — E46 Unspecified protein-calorie malnutrition: Secondary | ICD-10-CM | POA: Diagnosis not present

## 2015-06-09 DIAGNOSIS — J90 Pleural effusion, not elsewhere classified: Secondary | ICD-10-CM

## 2015-06-09 LAB — CBC WITH DIFFERENTIAL/PLATELET
BASO%: 4.1 % — AB (ref 0.0–2.0)
BASOS ABS: 0.1 10*3/uL (ref 0.0–0.1)
EOS%: 1.4 % (ref 0.0–7.0)
Eosinophils Absolute: 0 10*3/uL (ref 0.0–0.5)
HCT: 15.5 % — ABNORMAL LOW (ref 38.4–49.9)
HEMOGLOBIN: 5.2 g/dL — AB (ref 13.0–17.1)
LYMPH#: 1.2 10*3/uL (ref 0.9–3.3)
LYMPH%: 77.7 % — ABNORMAL HIGH (ref 14.0–49.0)
MCH: 30.4 pg (ref 27.2–33.4)
MCHC: 33.5 g/dL (ref 32.0–36.0)
MCV: 90.6 fL (ref 79.3–98.0)
MONO#: 0.2 10*3/uL (ref 0.1–0.9)
MONO%: 10.8 % (ref 0.0–14.0)
NEUT%: 6 % — ABNORMAL LOW (ref 39.0–75.0)
NEUTROS ABS: 0.1 10*3/uL — AB (ref 1.5–6.5)
NRBC: 17 % — AB (ref 0–0)
Platelets: 60 10*3/uL — ABNORMAL LOW (ref 140–400)
RBC: 1.71 10*6/uL — AB (ref 4.20–5.82)
RDW: 16.4 % — AB (ref 11.0–14.6)
WBC: 1.5 10*3/uL — AB (ref 4.0–10.3)

## 2015-06-09 LAB — COMPREHENSIVE METABOLIC PANEL
ALBUMIN: 1.8 g/dL — AB (ref 3.5–5.0)
ALK PHOS: 659 U/L — AB (ref 40–150)
ALT: 81 U/L — AB (ref 0–55)
AST: 162 U/L — AB (ref 5–34)
Anion Gap: 10 mEq/L (ref 3–11)
BILIRUBIN TOTAL: 2.67 mg/dL — AB (ref 0.20–1.20)
BUN: 31.4 mg/dL — AB (ref 7.0–26.0)
CALCIUM: 8.3 mg/dL — AB (ref 8.4–10.4)
CO2: 23 mEq/L (ref 22–29)
CREATININE: 1 mg/dL (ref 0.7–1.3)
Chloride: 104 mEq/L (ref 98–109)
EGFR: 80 mL/min/{1.73_m2} — ABNORMAL LOW (ref >90)
Glucose: 88 mg/dl (ref 70–140)
Potassium: 3.6 mEq/L (ref 3.5–5.1)
Sodium: 137 mEq/L (ref 136–145)
TOTAL PROTEIN: 5.6 g/dL — AB (ref 6.4–8.3)

## 2015-06-09 MED ORDER — METHYLPREDNISOLONE 4 MG PO TBPK: ORAL_TABLET | ORAL | Status: DC

## 2015-06-09 NOTE — Progress Notes (Signed)
[name] Telephone:([phone]) [phone]   [phone]  OFFICE PROGRESS NOTE  [name], MD [place] [address]  DIAGNOSIS: Extensive stage (T3, N3, M1b) small cell lung cancer presented with large right middle and lower lobe mass in addition to extensive mediastinal lymphadenopathy, right pleural effusion as well as extensive liver metastasis and retroperitoneal lymphadenopathy diagnosed in April 2017.  PRIOR THERAPY: None.  CURRENT THERAPY: Systemic chemotherapy with reduced dose carboplatin for AUC of 3 on day 1 and etoposide 60 MG/M2 on days 1, 2 and 3. First dose was given at [facility].  INTERVAL HISTORY: Shane Vasquez 65 y.o. male returns to the clinic today for follow-up visit accompanied by his wife and brother. The patient completed the first cycle of his systemic chemotherapy with reduced dose carboplatin and etoposide at [facility] and tolerated it fairly well. He is currently a resident of the [facility] skilled nursing facility. He is feeling much better compared to 2 weeks ago. He currently undergoing physical therapy and tolerating it well. He denied having any significant chest pain but continues to have shortness breath with exertion was no cough or hemoptysis. The patient denied having any fever or chills. He has no nausea or vomiting. She has a lot of bruises on the right arm after blood was drawn from the arm. He is here today for evaluation after the first cycle of his chemotherapy.  MEDICAL HISTORY: Past Medical History  Diagnosis Date  . CERVICAL RADICULOPATHY, RIGHT 08/12/2009  . DIVERTICULOSIS, COLON 08/15/2006  . GERD 08/15/2006  . HYPERLIPIDEMIA 08/15/2006  . HYPERTENSION 08/15/2006  . INTERNAL HEMORRHOIDS 02/29/2008  . SKIN CANCER, HX OF 08/15/2006  . CAP (community acquired pneumonia)     LLL 07/2014  . Empyema lung ([place])     2016 on left    ALLERGIES:  is allergic to bee  venom.  MEDICATIONS:  Current Outpatient Prescriptions  Medication Sig Dispense Refill  . citalopram (CELEXA) 40 MG tablet Take 0.5 tablets (20 mg total) by mouth daily.    Marland Kitchen EPINEPHrine 0.3 mg/0.3 mL IJ SOAJ injection Inject 0.3 mLs (0.3 mg total) into the muscle once. 1 Device 2  . hydrochlorothiazide (HYDRODIURIL) 12.5 MG tablet Take 1 tablet (12.5 mg total) by mouth daily.    Marland Kitchen lactose free nutrition (BOOST PLUS) LIQD Take 237 mLs by mouth 2 (two) times daily between meals.  0  . ondansetron (ZOFRAN) 4 MG tablet Take 1 tablet (4 mg total) by mouth every 8 (eight) hours as needed for nausea or vomiting. 30 tablet 0  . oxyCODONE (ROXICODONE) 5 MG immediate release tablet TAKE 1-2 TABLETS BY MOUTH EVERY 4-6 HOURS AS NEEDED FOR SEVERE PAIN 20 tablet 0  . polyethylene glycol (MIRALAX / GLYCOLAX) packet Take 17 g by mouth daily as needed for mild constipation.    . senna (SENOKOT) 8.6 MG TABS tablet Take 1 tablet (8.6 mg total) by mouth daily.  0   No current facility-administered medications for this visit.    SURGICAL HISTORY:  Past Surgical History  Procedure Laterality Date  . Tonsillectomy    . Video bronchoscopy N/A 07/27/2014    Procedure: VIDEO BRONCHOSCOPY;  Surgeon: [name], MD;  Location: [facility] OR;  Service: Thoracic;  Laterality: N/A;  . Video assisted thoracoscopy (vats)/empyema Left 07/27/2014    Procedure: VIDEO ASSISTED THORACOSCOPY (VATS)/EMPYEMA Left, drainage pleural effusion.;  Surgeon: [name], MD;  Location: [place];  Service:  Thoracic;  Laterality: Left;  . Video bronchoscopy Bilateral 05/20/2015    Procedure: VIDEO BRONCHOSCOPY WITH FLUORO;  Surgeon: Collene Gobble, MD;  Location: [place];  Service: Cardiopulmonary;  Laterality: Bilateral;    REVIEW OF SYSTEMS:  Constitutional: positive for anorexia, fatigue and weight loss Eyes: negative Ears, nose, mouth, throat, and face: negative Respiratory: positive for dyspnea on exertion Cardiovascular:  negative Gastrointestinal: negative Genitourinary:negative Integument/breast: negative Hematologic/lymphatic: negative Musculoskeletal:positive for muscle weakness Neurological: negative Behavioral/Psych: negative Endocrine: negative Allergic/Immunologic: negative   PHYSICAL EXAMINATION: General appearance: alert, cooperative, fatigued and no distress Head: Normocephalic, without obvious abnormality, atraumatic Neck: no adenopathy, no JVD, supple, symmetrical, trachea midline and thyroid not enlarged, symmetric, no tenderness/mass/nodules Lymph nodes: Cervical, supraclavicular, and axillary nodes normal. Resp: clear to auscultation bilaterally Back: symmetric, no curvature. ROM normal. No CVA tenderness. Cardio: regular rate and rhythm, S1, S2 normal, no murmur, click, rub or gallop GI: soft, non-tender; bowel sounds normal; no masses,  no organomegaly Extremities: extremities normal, atraumatic, no cyanosis or edema Neurologic: Alert and oriented X 3, normal strength and tone. Normal symmetric reflexes. Normal coordination and gait  ECOG PERFORMANCE STATUS: 2 - Symptomatic, <50% confined to bed  Blood pressure 103/77, pulse 88, temperature 98.9 F (37.2 C), temperature source Oral, resp. rate 18, height '5\' 9"'$  (1.753 m), weight 166 lb 11.2 oz (75.615 kg), SpO2 98 %.  LABORATORY DATA: Lab Results  Component Value Date   WBC 9.6 06/03/2015   HGB 8.3* 06/03/2015   HCT 24.0* 06/03/2015   MCV 89.2 06/03/2015   PLT 56* 06/03/2015      Chemistry      Component Value Date/Time   NA 137 06/03/2015 0332   NA 135* 05/29/2015 1313   K 4.3 06/03/2015 0332   K 4.8 05/29/2015 1313   CL 108 06/03/2015 0332   CO2 18* 06/03/2015 0332   CO2 21* 05/29/2015 1313   BUN 89* 06/03/2015 0332   BUN 76.9* 05/29/2015 1313   CREATININE 1.29* 06/03/2015 0332   CREATININE 1.9* 05/29/2015 1313      Component Value Date/Time   CALCIUM 8.0* 06/03/2015 0332   CALCIUM 10.2 05/29/2015 1313    ALKPHOS 757* 06/02/2015 0334   ALKPHOS 923* 05/29/2015 1313   AST 585* 06/02/2015 0334   AST 610* 05/29/2015 1313   ALT 121* 06/02/2015 0334   ALT 139* 05/29/2015 1313   BILITOT 4.5* 06/02/2015 0334   BILITOT 3.73* 05/29/2015 1313       RADIOGRAPHIC STUDIES: Ct Abdomen Pelvis Wo Contrast  05/18/2015  CLINICAL DATA:  65 year old male with abdominal pelvic pain, nausea and right lower lung opacity identified on recent chest radiograph. EXAM: CT CHEST, ABDOMEN AND PELVIS WITHOUT CONTRAST TECHNIQUE: Multidetector CT imaging of the chest, abdomen and pelvis was performed following the standard protocol without IV contrast. COMPARISON:  05/18/2015 and prior radiographs.  07/26/2014 chest CT. FINDINGS: CT CHEST Mediastinum/Nodes: Multiple enlarged mediastinal and right hilar lymph nodes are identified. Index nodes include the following: A 3.8 x 3.8 cm right peritracheal node (image 25). A 3.4 x 4.6 cm sub carinal node (image 33). A 1.7 cm right hilar node (image 32). There is no evidence of pericardial effusion. Ectasia of the ascending aorta measuring 3.5 cm noted. Lungs/Pleura: An ill-defined 7 x 9 cm mass within the central right middle/ lower lobes identified extending to the right hilum. Nodular interstitial thickening within the right lower lobe identified. Ill-defined ground-glass and nodular opacities within the right upper lobe are identified. A small right pleural  effusion is noted with pleural nodularity. 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 There is no evidence of left pleural effusion. Emphysema is identified. Musculoskeletal: No acute or suspicious abnormalities identified. CT ABDOMEN AND PELVIS Please note that parenchymal abnormalities may be missed without intravenous contrast. Hepatobiliary: Multiple faint hypodense lesions within the liver are noted. The largest lesion measures 1.5 cm and anterior liver (image 57). The gallbladder is  unremarkable. There is no evidence of biliary dilatation. Pancreas: Unremarkable Spleen: Unremarkable Adrenals/Urinary Tract: Mild bilateral renal atrophy noted. No definite mass, hydronephrosis urinary calculi noted. The bladder and adrenal glands are unremarkable. Stomach/Bowel: Colonic diverticulosis noted without diverticulitis. No evidence bowel obstruction or definite focal bowel wall thickening. Vascular/Lymphatic: Enlarged retroperitoneal and upper abdominal lymph nodes identified. An index 1.8 x 2.1 cm right retroperitoneal node (image 68) noted. Aortic atherosclerotic calcifications noted without aneurysm. Reproductive: Mild prostate enlargement noted. Other: No free fluid, pneumoperitoneum or focal collection. Musculoskeletal: No acute or suspicious abnormalities identified. IMPRESSION: 7 x 9 cm central right middle lobe/lower lobe mass compatible with malignancy. Right lower lobe nodular interstitial thickening,, right upper lobe nodules, right pleural nodules with small right pleural effusion, enlarged mediastinal, right hilar and upper abdominal lymph nodes, and ill-defined hepatic lesions- compatible with metastatic disease. Electronically Signed   By: Margarette Canada M.D.   On: 05/18/2015 14:51   Dg Chest 1 View  05/19/2015  CLINICAL DATA:  Status post right-sided thoracentesis EXAM: CHEST 1 VIEW COMPARISON:  May 18, 2015 chest CT and chest radiograph FINDINGS: No pneumothorax. There has been near complete resolution of pleural effusion on the right. There is persistent right middle mass with adjacent lobe airspace consolidation. There is scarring and atelectatic change in the left base. The heart size and pulmonary vascularity are within normal limits. Multiple foci of adenopathy are noted throughout the mediastinum superiorly and on the right. IMPRESSION: No pneumothorax. Essentially complete resolution of pleural effusion on the right. Mass with airspace consolidation remains on the right,  unchanged. Extensive adenopathy present. Scarring and atelectatic change are noted in the left base. Electronically Signed   By: Lowella Grip III M.D.   On: 05/19/2015 14:03   Dg Chest 2 View  05/29/2015  CLINICAL DATA:  COPD, hypertension, recent diagnosis of small cell lung cancer with metastasis, presented today with fatigue, weakness, weight loss and poor oral intake, hiccups, back pain, abdominal pain, nausea EXAM: CHEST  2 VIEW COMPARISON:  05/19/2015 FINDINGS: Normal heart size and pulmonary vascularity. Mediastinal enlargement corresponding to extensive adenopathy on prior CT. Increased opacity at RIGHT lung base consistent with known tumor, increased atelectasis and pleural effusion. Underlying emphysematous and bronchitic changes. Subsegmental atelectasis and scarring at LEFT base persists. No pneumothorax. Bones demineralized. IMPRESSION: COPD changes with increased RIGHT basilar opacity corresponding to known tumor which increased atelectasis and pleural effusion. Mediastinal adenopathy. Electronically Signed   By: Lavonia Dana M.D.   On: 05/29/2015 19:59   Dg Chest 2 View  05/18/2015  CLINICAL DATA:  Shortness of breath and dry cough for 1-1/2 weeks. Former smoker for 50 years. EXAM: CHEST  2 VIEW COMPARISON:  Chest x-rays dated 01/02/2015 and 09/19/2014. FINDINGS: New ill-defined airspace opacity within the anterior aspect of the right lower lobe, extending to the right hilum. Chronic scarring/ atelectasis noted at the left lung base. Lungs are hyperexpanded indicating some degree of COPD. Coarse interstitial markings are seen within both lungs which is likely a related chronic interstitial lung disease. Heart size  is upper normal. IMPRESSION: New dense ill-defined airspace opacity within the right lower lobe, extending to the right hilum. Additional vague density at the right hilum and within the overlying medial suprahilar region. This could represent pneumonia or airspace collapse related  to a central obstructing mass. Recommend chest CT with contrast for further characterization. Hyperexpanded lungs indicating COPD/emphysema. Suspect associated chronic interstitial lung disease bilaterally. Electronically Signed   By: Franki Cabot M.D.   On: 05/18/2015 12:37   Dg Abd 1 View  05/18/2015  CLINICAL DATA:  Lower abdominal pain, constipation. EXAM: ABDOMEN - 1 VIEW COMPARISON:  None. FINDINGS: The bowel gas pattern is normal. Phleboliths are noted in the pelvis. Inferior tip of liver extends iliac wing suggesting hepatomegaly. IMPRESSION: No evidence of bowel obstruction or ileus.  Possible hepatomegaly. Electronically Signed   By: Marijo Conception, M.D.   On: 05/18/2015 11:20   Ct Chest Wo Contrast  05/18/2015  CLINICAL DATA:  65 year old male with abdominal pelvic pain, nausea and right lower lung opacity identified on recent chest radiograph. EXAM: CT CHEST, ABDOMEN AND PELVIS WITHOUT CONTRAST TECHNIQUE: Multidetector CT imaging of the chest, abdomen and pelvis was performed following the standard protocol without IV contrast. COMPARISON:  05/18/2015 and prior radiographs.  07/26/2014 chest CT. FINDINGS: CT CHEST Mediastinum/Nodes: Multiple enlarged mediastinal and right hilar lymph nodes are identified. Index nodes include the following: A 3.8 x 3.8 cm right peritracheal node (image 25). A 3.4 x 4.6 cm sub carinal node (image 33). A 1.7 cm right hilar node (image 32). There is no evidence of pericardial effusion. Ectasia of the ascending aorta measuring 3.5 cm noted. Lungs/Pleura: An ill-defined 7 x 9 cm mass within the central right middle/ lower lobes identified extending to the right hilum. Nodular interstitial thickening within the right lower lobe identified. Ill-defined ground-glass and nodular opacities within the right upper lobe are identified. A small right pleural effusion is noted with pleural nodularity. These findings are compatible with pulmonary malignancy with pleural and lymph  node metastases. There is no evidence of left pleural effusion. Emphysema is identified. Musculoskeletal: No acute or suspicious abnormalities identified. CT ABDOMEN AND PELVIS Please note that parenchymal abnormalities may be missed without intravenous contrast. Hepatobiliary: Multiple faint hypodense lesions within the liver are noted. The largest lesion measures 1.5 cm and anterior liver (image 57). The gallbladder is unremarkable. There is no evidence of biliary dilatation. Pancreas: Unremarkable Spleen: Unremarkable Adrenals/Urinary Tract: Mild bilateral renal atrophy noted. No definite mass, hydronephrosis urinary calculi noted. The bladder and adrenal glands are unremarkable. Stomach/Bowel: Colonic diverticulosis noted without diverticulitis. No evidence bowel obstruction or definite focal bowel wall thickening. Vascular/Lymphatic: Enlarged retroperitoneal and upper abdominal lymph nodes identified. An index 1.8 x 2.1 cm right retroperitoneal node (image 68) noted. Aortic atherosclerotic calcifications noted without aneurysm. Reproductive: Mild prostate enlargement noted. Other: No free fluid, pneumoperitoneum or focal collection. Musculoskeletal: No acute or suspicious abnormalities identified. IMPRESSION: 7 x 9 cm central right middle lobe/lower lobe mass compatible with malignancy. Right lower lobe nodular interstitial thickening,, right upper lobe nodules, right pleural nodules with small right pleural effusion, enlarged mediastinal, right hilar and upper abdominal lymph nodes, and ill-defined hepatic lesions- compatible with metastatic disease. Electronically Signed   By: Margarette Canada M.D.   On: 05/18/2015 14:51   Mr Jeri Cos ZH Contrast  06/03/2015  CLINICAL DATA:  Metastatic lung cancer.  Staging. EXAM: MRI HEAD WITHOUT AND WITH CONTRAST TECHNIQUE: Multiplanar, multiecho pulse sequences of the brain and surrounding structures were  obtained without and with intravenous contrast. CONTRAST:  6m  MULTIHANCE GADOBENATE DIMEGLUMINE 529 MG/ML IV SOLN COMPARISON:  None. FINDINGS: Ventricle size normal.  Cerebral volume normal for age. Negative for acute infarct. Scattered small nonenhancing white matter hyperintensities bilaterally most consistent with chronic microvascular ischemia. Negative for intracranial hemorrhage. Negative for edema in the brain. Postcontrast imaging reveals no enhancing brain lesions. Vascular enhancement normal. Leptomeningeal enhancement normal. Bony lesions are present in the clivus and C2 vertebral body which enhance and are consistent with metastatic disease. No other skull lesions identified. IMPRESSION: Metastatic deposits in the clivus and C2 vertebral body. Negative for metastatic disease to the brain. Electronically Signed   By: CFranchot GalloM.D.   On: 06/03/2015 07:17   UKoreaThoracentesis Asp Pleural Space W/img Guide  05/19/2015  INDICATION: Newly diagnosed right lung mass with associated pleural effusion. Request diagnostic and therapeutic thoracentesis EXAM: ULTRASOUND GUIDED RIGHT THORACENTESIS MEDICATIONS: None. COMPLICATIONS: None immediate. PROCEDURE: An ultrasound guided thoracentesis was thoroughly discussed with the patient and questions answered. The benefits, risks, alternatives and complications were also discussed. The patient understands and wishes to proceed with the procedure. Written consent was obtained. Ultrasound was performed to localize and mark an adequate pocket of fluid in the right chest. The area was then prepped and draped in the normal sterile fashion. 1% Lidocaine was used for local anesthesia. Under ultrasound guidance a Safe-T-Centesis catheter was introduced. Thoracentesis was performed. The catheter was removed and a dressing applied. FINDINGS: A total of approximately 830 mL of hazy, dark yellow fluid was removed. Samples were sent to the laboratory as requested by the clinical team. Limited ultrasound imaging of the liver during this  procedure finds numerous lesions suspicious for metastatic disease. IMPRESSION: Successful ultrasound guided right thoracentesis yielding 830 mL of pleural fluid. Read by: KAscencion DikePA-C Electronically Signed   By: DLucrezia EuropeM.D.   On: 05/19/2015 14:26    ASSESSMENT AND PLAN: This is a very pleasant 65years old white male recently diagnosed with extensive stage small cell lung cancer status post first cycle of chemotherapy with reduced dose carboplatin and etoposide. The patient related the first cycle well he is feeling much better after starting chemotherapy and currently at the skilled nursing facility and doing physical exercise. He continues to have lack of appetite and weight loss. I will start the patient on Medrol Dosepak. The patient will continue to have weekly lab work. The patient will come back for follow-up visit in 2 weeks for evaluation before starting cycle #2 of his chemotherapy. He was advised to call immediately if he has any concerning symptoms in the interval. The patient voices understanding of current disease status and treatment options and is in agreement with the current care plan.  All questions were answered. The patient knows to call the clinic with any problems, questions or concerns. We can certainly see the patient much sooner if necessary.  I spent 15 minutes counseling the patient face to face. The total time spent in the appointment was 25 minutes.  Disclaimer: This note was dictated with voice recognition software. Similar sounding words can inadvertently be transcribed and may not be corrected upon review.

## 2015-06-09 NOTE — Telephone Encounter (Signed)
Wife and pt aware of need for blood transfusion and Onc tx request sent for transfusion tomorrow. HAR obtained.

## 2015-06-09 NOTE — Telephone Encounter (Signed)
Gave and printed appt sched and avs for pt for May and June  °

## 2015-06-10 ENCOUNTER — Other Ambulatory Visit: Payer: Medicare Other

## 2015-06-10 ENCOUNTER — Other Ambulatory Visit: Payer: Self-pay | Admitting: Medical Oncology

## 2015-06-10 ENCOUNTER — Telehealth: Payer: Self-pay | Admitting: Medical Oncology

## 2015-06-10 ENCOUNTER — Ambulatory Visit (HOSPITAL_COMMUNITY)
Admission: RE | Admit: 2015-06-10 | Discharge: 2015-06-10 | Disposition: A | Discharge status: Home / Self Care | Payer: No Typology Code available for payment source | Source: Ambulatory Visit | Attending: Internal Medicine | Admitting: Internal Medicine

## 2015-06-10 VITALS — BP 110/52 | HR 72 | Temp 99.0°F | Resp 16

## 2015-06-10 DIAGNOSIS — D649 Anemia, unspecified: Secondary | ICD-10-CM

## 2015-06-10 DIAGNOSIS — I878 Other specified disorders of veins: Secondary | ICD-10-CM

## 2015-06-10 LAB — PREPARE RBC (CROSSMATCH)

## 2015-06-10 MED ORDER — SODIUM CHLORIDE 0.9 % IV SOLN
250.0000 mL | Freq: Once | INTRAVENOUS | Status: AC
  Administered 2015-06-10: 250 mL via INTRAVENOUS

## 2015-06-10 MED ORDER — ACETAMINOPHEN 325 MG PO TABS
650.0000 mg | ORAL_TABLET | Freq: Once | ORAL | Status: AC
  Administered 2015-06-10: 650 mg via ORAL
  Filled 2015-06-10: qty 2

## 2015-06-10 MED ORDER — DIPHENHYDRAMINE HCL 25 MG PO CAPS
25.0000 mg | ORAL_CAPSULE | Freq: Once | ORAL | Status: AC
  Administered 2015-06-10: 25 mg via ORAL
  Filled 2015-06-10: qty 1

## 2015-06-10 NOTE — Discharge Instructions (Signed)

## 2015-06-10 NOTE — Telephone Encounter (Signed)
Per sickle cell pt BP  was 88/43 to 102/ 48, 95/48 and 110/52 before during and after blood transfusion.

## 2015-06-10 NOTE — Telephone Encounter (Signed)
I left message for brother that I will get back with him after speaking to [facility].

## 2015-06-10 NOTE — Telephone Encounter (Signed)
Brother notified that the port order was  requested and some one will get back with he pt.

## 2015-06-10 NOTE — Telephone Encounter (Signed)
Brother reports blood pressure low in sickle cell clinic [phone] and brother asking if Julien Nordmann will stop HCTZ 12.5 mg . If so please contact [facility]. Note to [facility]amed[facility].

## 2015-06-10 NOTE — Progress Notes (Signed)
Patient ID: Shane Vasquez, male   DOB: 12-16-1950, 65 y.o.   MRN: [id number]  PCP: Curt Bears MD  Associated Diagnosis: Symptomatic Anemia  Procedure: Transfusion of 2 units PRBC peripherally. Patient came with oxygen at 2 L/[place]. Connected patient to wall oxygen at 2 l [place]...Marland KitchenMarland KitchenPatient tolerated the transfusion well. No shortness of breath or reaction.  Went over discharge instructions and copy of instructions given to patient/wife. Alert, oriented and 1 assist from chair to wheelchair. Discharged with wife. Wife says [facility] (care home) is coming to pick up. She will give a copy of the discharge instructions to the care home. Discharged via wheelchair.

## 2015-06-11 ENCOUNTER — Telehealth: Payer: Self-pay | Admitting: Medical Oncology

## 2015-06-11 ENCOUNTER — Other Ambulatory Visit: Payer: Self-pay | Admitting: Medical Oncology

## 2015-06-11 LAB — TYPE AND SCREEN
ABO/RH(D): O POS
ANTIBODY SCREEN: NEGATIVE
Unit division: 0
Unit division: 0

## 2015-06-11 LAB — ABO/RH: ABO/RH(D): O POS

## 2015-06-11 NOTE — Telephone Encounter (Signed)
HCTZ discontinued

## 2015-06-11 NOTE — Telephone Encounter (Signed)
[place] notified.

## 2015-06-12 ENCOUNTER — Other Ambulatory Visit: Payer: Self-pay | Admitting: Medical Oncology

## 2015-06-12 DIAGNOSIS — R630 Anorexia: Secondary | ICD-10-CM

## 2015-06-12 MED ORDER — METHYLPREDNISOLONE 4 MG PO TBPK: ORAL_TABLET | ORAL | Status: DC

## 2015-06-12 NOTE — Telephone Encounter (Signed)
"  Mardene Celeste from [employer] calling about hydrodiuril for t his patient.  He and his brother say Dr. Julien Nordmann told him not to take this anymore.  Is this true and if so please fax an order attn. Mardene Celeste to [phone]."

## 2015-06-12 NOTE — Progress Notes (Signed)
Faxed prednisone taper pak to [facility]

## 2015-06-16 ENCOUNTER — Other Ambulatory Visit (HOSPITAL_BASED_OUTPATIENT_CLINIC_OR_DEPARTMENT_OTHER): Payer: Medicare Other

## 2015-06-16 DIAGNOSIS — C3491 Malignant neoplasm of unspecified part of right bronchus or lung: Secondary | ICD-10-CM

## 2015-06-16 LAB — CBC WITH DIFFERENTIAL/PLATELET
BASO%: 0.7 % (ref 0.0–2.0)
BASOS ABS: 0.1 10*3/uL (ref 0.0–0.1)
EOS ABS: 0 10*3/uL (ref 0.0–0.5)
EOS%: 0.3 % (ref 0.0–7.0)
HEMATOCRIT: 28 % — AB (ref 38.4–49.9)
HEMOGLOBIN: 9.2 g/dL — AB (ref 13.0–17.1)
LYMPH#: 1.3 10*3/uL (ref 0.9–3.3)
LYMPH%: 12.9 % — ABNORMAL LOW (ref 14.0–49.0)
MCH: 30.8 pg (ref 27.2–33.4)
MCHC: 33 g/dL (ref 32.0–36.0)
MCV: 93.1 fL (ref 79.3–98.0)
MONO#: 2 10*3/uL — AB (ref 0.1–0.9)
MONO%: 20.5 % — ABNORMAL HIGH (ref 0.0–14.0)
NEUT%: 65.6 % (ref 39.0–75.0)
NEUTROS ABS: 6.4 10*3/uL (ref 1.5–6.5)
PLATELETS: 333 10*3/uL (ref 140–400)
RBC: 3 10*6/uL — ABNORMAL LOW (ref 4.20–5.82)
RDW: 16.2 % — AB (ref 11.0–14.6)
WBC: 9.7 10*3/uL (ref 4.0–10.3)

## 2015-06-16 LAB — COMPREHENSIVE METABOLIC PANEL
ALBUMIN: 1.8 g/dL — AB (ref 3.5–5.0)
ALK PHOS: 458 U/L — AB (ref 40–150)
ALT: 62 U/L — ABNORMAL HIGH (ref 0–55)
AST: 71 U/L — ABNORMAL HIGH (ref 5–34)
Anion Gap: 7 mEq/L (ref 3–11)
BILIRUBIN TOTAL: 1.2 mg/dL (ref 0.20–1.20)
BUN: 11.2 mg/dL (ref 7.0–26.0)
CALCIUM: 8.2 mg/dL — AB (ref 8.4–10.4)
CO2: 28 mEq/L (ref 22–29)
Chloride: 100 mEq/L (ref 98–109)
Creatinine: 0.9 mg/dL (ref 0.7–1.3)
EGFR: 89 mL/min/{1.73_m2} — AB (ref >90)
GLUCOSE: 176 mg/dL — AB (ref 70–140)
POTASSIUM: 3.7 meq/L (ref 3.5–5.1)
Sodium: 135 mEq/L — ABNORMAL LOW (ref 136–145)
TOTAL PROTEIN: 5.6 g/dL — AB (ref 6.4–8.3)

## 2015-06-16 LAB — TECHNOLOGIST REVIEW

## 2015-06-17 ENCOUNTER — Other Ambulatory Visit: Payer: Self-pay | Admitting: Radiology

## 2015-06-18 ENCOUNTER — Other Ambulatory Visit: Payer: Self-pay | Admitting: Radiology

## 2015-06-18 ENCOUNTER — Other Ambulatory Visit: Payer: Self-pay | Admitting: General Surgery

## 2015-06-19 ENCOUNTER — Ambulatory Visit (HOSPITAL_COMMUNITY)
Admission: RE | Admit: 2015-06-19 | Discharge: 2015-06-19 | Disposition: A | Discharge status: Home / Self Care | Payer: Medicare Other | Source: Ambulatory Visit | Attending: Internal Medicine | Admitting: Internal Medicine

## 2015-06-19 ENCOUNTER — Other Ambulatory Visit: Payer: Self-pay | Admitting: Internal Medicine

## 2015-06-19 ENCOUNTER — Encounter (HOSPITAL_COMMUNITY): Payer: Self-pay

## 2015-06-19 DIAGNOSIS — C787 Secondary malignant neoplasm of liver and intrahepatic bile duct: Secondary | ICD-10-CM | POA: Diagnosis not present

## 2015-06-19 DIAGNOSIS — R591 Generalized enlarged lymph nodes: Secondary | ICD-10-CM | POA: Diagnosis not present

## 2015-06-19 DIAGNOSIS — Z85828 Personal history of other malignant neoplasm of skin: Secondary | ICD-10-CM | POA: Diagnosis not present

## 2015-06-19 DIAGNOSIS — I10 Essential (primary) hypertension: Secondary | ICD-10-CM | POA: Insufficient documentation

## 2015-06-19 DIAGNOSIS — Z79899 Other long term (current) drug therapy: Secondary | ICD-10-CM | POA: Diagnosis not present

## 2015-06-19 DIAGNOSIS — Z8262 Family history of osteoporosis: Secondary | ICD-10-CM | POA: Diagnosis not present

## 2015-06-19 DIAGNOSIS — C3491 Malignant neoplasm of unspecified part of right bronchus or lung: Secondary | ICD-10-CM | POA: Diagnosis present

## 2015-06-19 DIAGNOSIS — Z87891 Personal history of nicotine dependence: Secondary | ICD-10-CM | POA: Diagnosis not present

## 2015-06-19 DIAGNOSIS — K219 Gastro-esophageal reflux disease without esophagitis: Secondary | ICD-10-CM | POA: Insufficient documentation

## 2015-06-19 DIAGNOSIS — I878 Other specified disorders of veins: Secondary | ICD-10-CM

## 2015-06-19 DIAGNOSIS — E785 Hyperlipidemia, unspecified: Secondary | ICD-10-CM | POA: Insufficient documentation

## 2015-06-19 DIAGNOSIS — C7951 Secondary malignant neoplasm of bone: Secondary | ICD-10-CM | POA: Diagnosis not present

## 2015-06-19 DIAGNOSIS — Z825 Family history of asthma and other chronic lower respiratory diseases: Secondary | ICD-10-CM | POA: Insufficient documentation

## 2015-06-19 LAB — PROTIME-INR
INR: 1.39 (ref 0.00–1.49)
PROTHROMBIN TIME: 16.6 s — AB (ref 11.6–15.2)

## 2015-06-19 LAB — CBC WITH DIFFERENTIAL/PLATELET
BASOS ABS: 0 10*3/uL (ref 0.0–0.1)
Basophils Relative: 0 %
Eosinophils Absolute: 0 10*3/uL (ref 0.0–0.7)
Eosinophils Relative: 0 %
HCT: 26.3 % — ABNORMAL LOW (ref 39.0–52.0)
Hemoglobin: 8.5 g/dL — ABNORMAL LOW (ref 13.0–17.0)
LYMPHS ABS: 1.8 10*3/uL (ref 0.7–4.0)
Lymphocytes Relative: 10 %
MCH: 30.5 pg (ref 26.0–34.0)
MCHC: 32.3 g/dL (ref 30.0–36.0)
MCV: 94.3 fL (ref 78.0–100.0)
MONO ABS: 3 10*3/uL — AB (ref 0.1–1.0)
MONOS PCT: 17 %
NEUTROS PCT: 73 %
Neutro Abs: 12.7 10*3/uL — ABNORMAL HIGH (ref 1.7–7.7)
Platelets: 520 10*3/uL — ABNORMAL HIGH (ref 150–400)
RBC: 2.79 MIL/uL — AB (ref 4.22–5.81)
RDW: 17.5 % — AB (ref 11.5–15.5)
WBC: 17.5 10*3/uL — AB (ref 4.0–10.5)

## 2015-06-19 MED ORDER — HEPARIN SOD (PORK) LOCK FLUSH 100 UNIT/ML IV SOLN
INTRAVENOUS | Status: AC
  Filled 2015-06-19: qty 5

## 2015-06-19 MED ORDER — LIDOCAINE HCL 1 % IJ SOLN
INTRAMUSCULAR | Status: AC
  Filled 2015-06-19: qty 20

## 2015-06-19 MED ORDER — LIDOCAINE-EPINEPHRINE (PF) 2 %-1:200000 IJ SOLN
INTRAMUSCULAR | Status: AC | PRN
  Administered 2015-06-19: 10 mL via INTRADERMAL

## 2015-06-19 MED ORDER — HEPARIN SOD (PORK) LOCK FLUSH 100 UNIT/ML IV SOLN
INTRAVENOUS | Status: AC | PRN
  Administered 2015-06-19: 500 [IU]

## 2015-06-19 MED ORDER — FENTANYL CITRATE (PF) 100 MCG/2ML IJ SOLN
INTRAMUSCULAR | Status: AC | PRN
  Administered 2015-06-19: 50 ug via INTRAVENOUS

## 2015-06-19 MED ORDER — SODIUM CHLORIDE 0.9 % IV SOLN: INTRAVENOUS | Status: DC

## 2015-06-19 MED ORDER — FENTANYL CITRATE (PF) 100 MCG/2ML IJ SOLN
INTRAMUSCULAR | Status: AC
  Filled 2015-06-19: qty 4

## 2015-06-19 MED ORDER — MIDAZOLAM HCL 2 MG/2ML IJ SOLN
INTRAMUSCULAR | Status: AC | PRN
  Administered 2015-06-19: 0.5 mg via INTRAVENOUS
  Administered 2015-06-19: 1 mg via INTRAVENOUS
  Administered 2015-06-19: 0.5 mg via INTRAVENOUS

## 2015-06-19 MED ORDER — MIDAZOLAM HCL 2 MG/2ML IJ SOLN
INTRAMUSCULAR | Status: AC
  Filled 2015-06-19: qty 4

## 2015-06-19 MED ORDER — LIDOCAINE HCL 1 % IJ SOLN
INTRAMUSCULAR | Status: AC | PRN
  Administered 2015-06-19: 5 mL via INTRADERMAL

## 2015-06-19 MED ORDER — CEFAZOLIN SODIUM-DEXTROSE 2-4 GM/100ML-% IV SOLN
2.0000 g | INTRAVENOUS | Status: AC
  Administered 2015-06-19: 2 g via INTRAVENOUS
  Filled 2015-06-19: qty 100

## 2015-06-19 MED ORDER — LIDOCAINE-EPINEPHRINE (PF) 2 %-1:200000 IJ SOLN
INTRAMUSCULAR | Status: AC
  Filled 2015-06-19: qty 20

## 2015-06-19 NOTE — Discharge Instructions (Signed)
Implanted Port Insertion, Care After °Refer to this sheet in the next few weeks. These instructions provide you with information on caring for yourself after your procedure. Your health care provider may also give you more specific instructions. Your treatment has been planned according to current medical practices, but problems sometimes occur. Call your health care provider if you have any problems or questions after your procedure. °WHAT TO EXPECT AFTER THE PROCEDURE °After your procedure, it is typical to have the following:  °· Discomfort at the port insertion site. Ice packs to the area will help. °· Bruising on the skin over the port. This will subside in 3-4 days. °HOME CARE INSTRUCTIONS °· After your port is placed, you will get a manufacturer's information card. The card has information about your port. Keep this card with you at all times.   °· Know what kind of port you have. There are many types of ports available.   °· Wear a medical alert bracelet in case of an emergency. This can help alert health care workers that you have a port.   °· The port can stay in for as long as your health care provider believes it is necessary.   °· A home health care nurse may give medicines and take care of the port.   °· You or a family member can get special training and directions for giving medicine and taking care of the port at home.   °SEEK MEDICAL CARE IF:  °· Your port does not flush or you are unable to get a blood return.   °· You have a fever or chills. °SEEK IMMEDIATE MEDICAL CARE IF: °· You have new fluid or pus coming from your incision.   °· You notice a bad smell coming from your incision site.   °· You have swelling, pain, or more redness at the incision or port site.   °· You have chest pain or shortness of breath. °  °This information is not intended to replace advice given to you by your health care provider. Make sure you discuss any questions you have with your health care provider. °  °Document  Released: 11/22/2012 Document Revised: 02/06/2013 Document Reviewed: 11/22/2012 °Elsevier Interactive Patient Education ©2016 [employer] °Moderate Conscious Sedation, Adult, Care After °Refer to this sheet in the next few weeks. These instructions provide you with information on caring for yourself after your procedure. Your health care provider may also give you more specific instructions. Your treatment has been planned according to current medical practices, but problems sometimes occur. Call your health care provider if you have any problems or questions after your procedure. °WHAT TO EXPECT AFTER THE PROCEDURE  °After your procedure: °· You may feel sleepy, clumsy, and have poor balance for several hours. °· Vomiting may occur if you eat too soon after the procedure. °HOME CARE INSTRUCTIONS °· Do not participate in any activities where you could become injured for at least 24 hours. Do not: °¨ Drive. °¨ Swim. °¨ Ride a bicycle. °¨ Operate heavy machinery. °¨ Cook. °¨ Use power tools. °¨ Climb ladders. °¨ Work from a high place. °· Do not make important decisions or sign legal documents until you are improved. °· If you vomit, drink water, juice, or soup when you can drink without vomiting. Make sure you have little or no nausea before eating solid foods. °· Only take over-the-counter or prescription medicines for pain, discomfort, or fever as directed by your health care provider. °· Make sure you and your family fully understand everything about the medicines given   to you, including what side effects may occur. °· You should not drink alcohol, take sleeping pills, or take medicines that cause drowsiness for at least 24 hours. °· If you smoke, do not smoke without supervision. °· If you are feeling better, you may resume normal activities 24 hours after you were sedated. °· Keep all appointments with your health care provider. °SEEK MEDICAL CARE IF: °· Your skin is pale or bluish in color. °· You continue to  feel nauseous or vomit. °· Your pain is getting worse and is not helped by medicine. °· You have bleeding or swelling. °· You are still sleepy or feeling clumsy after 24 hours. °SEEK IMMEDIATE MEDICAL CARE IF: °· You develop a rash. °· You have difficulty breathing. °· You develop any type of allergic problem. °· You have a fever. °MAKE SURE YOU: °· Understand these instructions. °· Will watch your condition. °· Will get help right away if you are not doing well or get worse. °  °This information is not intended to replace advice given to you by your health care provider. Make sure you discuss any questions you have with your health care provider. °  °Document Released: 11/22/2012 Document Revised: 02/22/2014 Document Reviewed: 11/22/2012 °Elsevier Interactive Patient Education ©2016 [employer] ° °

## 2015-06-19 NOTE — H&P (Signed)
Chief Complaint: Patient was seen in consultation today for Port-A-Cath placement  Referring Physician(s): Mohamed,Mohamed  Supervising Physician: Jacqulynn Cadet  Patient Status: Out-pt  History of Present Illness: Shane Vasquez is a 65 y.o. male with past medical history as listed below and extensive stage (T3, N3, M1b) small cell lung cancer who initially presented with large right middle and lower lobe mass in addition to extensive mediastinal lymphadenopathy, right pleural effusion as well as extensive liver metastasis and retroperitoneal lymphadenopathy diagnosed in April 2017. MRI brain on 06/03/15 revealed metastatic deposits in the clivus and C2 vertebral body. He has started chemotherapy but has poor venous access and presents today for Port-A-Cath placement for additional chemotherapy treatments.  Past Medical History  Diagnosis Date  . CERVICAL RADICULOPATHY, RIGHT 08/12/2009  . DIVERTICULOSIS, COLON 08/15/2006  . GERD 08/15/2006  . HYPERLIPIDEMIA 08/15/2006  . HYPERTENSION 08/15/2006  . INTERNAL HEMORRHOIDS 02/29/2008  . SKIN CANCER, HX OF 08/15/2006  . CAP (community acquired pneumonia)     LLL 07/2014  . Empyema lung ([place])     2016 on left    Past Surgical History  Procedure Laterality Date  . Tonsillectomy    . Video bronchoscopy N/A 07/27/2014    Procedure: VIDEO BRONCHOSCOPY;  Surgeon: [name], MD;  Location: [facility] OR;  Service: Thoracic;  Laterality: N/A;  . Video assisted thoracoscopy (vats)/empyema Left 07/27/2014    Procedure: VIDEO ASSISTED THORACOSCOPY (VATS)/EMPYEMA Left, drainage pleural effusion.;  Surgeon: [name], MD;  Location: [place];  Service: Thoracic;  Laterality: Left;  . Video bronchoscopy Bilateral 05/20/2015    Procedure: VIDEO BRONCHOSCOPY WITH FLUORO;  Surgeon: Collene Gobble, MD;  Location: [place];  Service: Cardiopulmonary;  Laterality: Bilateral;    Allergies: Bee venom  Medications: Prior to Admission  medications   Medication Sig Start Date End Date Taking? Authorizing Provider  citalopram (CELEXA) 40 MG tablet Take 0.5 tablets (20 mg total) by mouth daily. 06/04/15  Yes Domenic Polite, MD  lactose free nutrition (BOOST PLUS) LIQD Take 237 mLs by mouth 2 (two) times daily between meals. 06/04/15  Yes Domenic Polite, MD  ondansetron (ZOFRAN) 4 MG tablet Take 1 tablet (4 mg total) by mouth every 8 (eight) hours as needed for nausea or vomiting. 05/26/15  Yes Marletta Lor, MD  oxyCODONE (ROXICODONE) 5 MG immediate release tablet TAKE 1-2 TABLETS BY MOUTH EVERY 4-6 HOURS AS NEEDED FOR SEVERE PAIN 06/04/15  Yes Domenic Polite, MD  senna (SENOKOT) 8.6 MG TABS tablet Take 1 tablet (8.6 mg total) by mouth daily. 06/04/15  Yes Domenic Polite, MD  EPINEPHrine 0.3 mg/0.3 mL IJ SOAJ injection Inject 0.3 mLs (0.3 mg total) into the muscle once. Patient not taking: Reported on 06/09/2015 08/08/14   Marletta Lor, MD  methylPREDNISolone (MEDROL DOSEPAK) 4 MG TBPK tablet use as instructed. 06/12/15   Curt Bears, MD  polyethylene glycol [facility] / GLYCOLAX) packet Take 17 g by mouth daily as needed for mild constipation.    Historical Provider, MD     Family History  Problem Relation Age of Onset  . Osteoporosis Mother   . COPD Father     Social History   Social History  . Marital Status: Married    Spouse Name: N/A  . Number of Children: N/A  . Years of Education: N/A   Social History Main Topics  . Smoking status: Former Smoker -- 1.00 packs/day    Types: Cigarettes    Quit date: 08/29/2012  .  Smokeless tobacco: Never Used  . Alcohol Use: No  . Drug Use: No  . Sexual Activity: No   Other Topics Concern  . None   Social History Narrative      Review of Systems  Constitutional: Positive for fatigue and unexpected weight change. Negative for fever and chills.  Respiratory: Positive for cough, chest tightness and shortness of breath.   Cardiovascular: Negative for chest pain.   Gastrointestinal: Positive for constipation. Negative for nausea, vomiting and blood in stool.       Occ abd pain  Genitourinary: Negative for dysuria and hematuria.  Musculoskeletal: Negative for back pain.  Neurological: Negative for headaches.    Vital Signs: BP 114/67 mmHg  Pulse 73  Temp(Src) 99 F (37.2 C) (Oral)  Resp 18  Ht '5\' 9"'$  (1.753 m)  Wt 143 lb (64.864 kg)  BMI 21.11 kg/m2  SpO2 98%  Physical Exam  Constitutional: He is oriented to person, place, and time.  Thin WM in NAD; on home O2  Cardiovascular: Normal rate and regular rhythm.   Pulmonary/Chest: Effort normal.  Diminished breath sounds right base, left clear.  Abdominal: Soft. Bowel sounds are normal.  Mild epigastric  tenderness to palpation.  Musculoskeletal: He exhibits no edema.  Neurological: He is alert and oriented to person, place, and time.    Mallampati Score:     Imaging: Dg Chest 2 View  05/29/2015  CLINICAL DATA:  COPD, hypertension, recent diagnosis of small cell lung cancer with metastasis, presented today with fatigue, weakness, weight loss and poor oral intake, hiccups, back pain, abdominal pain, nausea EXAM: CHEST  2 VIEW COMPARISON:  05/19/2015 FINDINGS: Normal heart size and pulmonary vascularity. Mediastinal enlargement corresponding to extensive adenopathy on prior CT. Increased opacity at RIGHT lung base consistent with known tumor, increased atelectasis and pleural effusion. Underlying emphysematous and bronchitic changes. Subsegmental atelectasis and scarring at LEFT base persists. No pneumothorax. Bones demineralized. IMPRESSION: COPD changes with increased RIGHT basilar opacity corresponding to known tumor which increased atelectasis and pleural effusion. Mediastinal adenopathy. Electronically Signed   By: Lavonia Dana M.D.   On: 05/29/2015 19:59   Mr Jeri Cos FW Contrast  06/03/2015  CLINICAL DATA:  Metastatic lung cancer.  Staging. EXAM: MRI HEAD WITHOUT AND WITH CONTRAST  TECHNIQUE: Multiplanar, multiecho pulse sequences of the brain and surrounding structures were obtained without and with intravenous contrast. CONTRAST:  48m MULTIHANCE GADOBENATE DIMEGLUMINE 529 MG/ML IV SOLN COMPARISON:  None. FINDINGS: Ventricle size normal.  Cerebral volume normal for age. Negative for acute infarct. Scattered small nonenhancing white matter hyperintensities bilaterally most consistent with chronic microvascular ischemia. Negative for intracranial hemorrhage. Negative for edema in the brain. Postcontrast imaging reveals no enhancing brain lesions. Vascular enhancement normal. Leptomeningeal enhancement normal. Bony lesions are present in the clivus and C2 vertebral body which enhance and are consistent with metastatic disease. No other skull lesions identified. IMPRESSION: Metastatic deposits in the clivus and C2 vertebral body. Negative for metastatic disease to the brain. Electronically Signed   By: CFranchot GalloM.D.   On: 06/03/2015 07:17    Labs:  CBC:  Recent Labs  06/02/15 0334 06/03/15 0332 06/09/15 1251 06/16/15 1051  WBC 11.9* 9.6 1.5* 9.7  HGB 9.1* 8.3* 5.2* 9.2*  HCT 26.2* 24.0* 15.5* 28.0*  PLT 62* 56* 60 Large platelets present* 333    COAGS:  Recent Labs  07/26/14 1851 05/20/15 0321  INR 1.19 1.25  APTT 32  --     BMP:  Recent Labs  05/31/15 0340 06/01/15 0410 06/02/15 0334 06/03/15 0332 06/09/15 1251 06/16/15 1052  NA 138 139 136 137 137 135*  K 4.9 4.9 5.0 4.3 3.6 3.7  CL 104 107 107 108  --   --   CO2 18* 19* 18* 18* 23 28  GLUCOSE 141* 121* 109* 119* 88 176*  BUN 99* 98* 100* 89* 31.4* 11.2  CALCIUM 8.9 8.6* 8.1* 8.0* 8.3* 8.2*  CREATININE 1.77* 1.58* 1.32* 1.29* 1.0 0.9  GFRNONAA 38* 44* 55* 56*  --   --   GFRAA 44* 51* >60 >60  --   --     LIVER FUNCTION TESTS:  Recent Labs  06/01/15 0410 06/02/15 0334 06/09/15 1251 06/16/15 1052  BILITOT 4.1* 4.5* 2.67* 1.20  AST 609* 585* 162* 71*  ALT 129* 121* 81* 62*    ALKPHOS 779* 757* 659* 458*  PROT 5.5* 5.1* 5.6* 5.6*  ALBUMIN 2.1* 1.9* 1.8* 1.8*    TUMOR MARKERS: No results for input(s): AFPTM, CEA, CA199, CHROMGRNA in the last 8760 hours.  Assessment and Plan:  65 y.o. male with  extensive stage (T3, N3, M1b) small cell lung cancer who initially presented with large right middle and lower lobe mass in addition to extensive mediastinal lymphadenopathy, right pleural effusion as well as extensive liver metastasis and retroperitoneal lymphadenopathy diagnosed in April 2017. MRI brain on 06/03/15 revealed metastatic deposits in the clivus and C2 vertebral body. He has started chemotherapy but has poor venous access and presents today for Port-A-Cath placement for additional chemotherapy treatments.Risks and benefits discussed with the patient/wife including, but not limited to bleeding, infection, pneumothorax, or fibrin sheath development and need for additional procedures.All of the patient's questions were answered, patient is agreeable to proceed.Consent signed and in chart. Labs pending.       Thank you for this interesting consult.  I greatly enjoyed meeting Shane Vasquez and look forward to participating in their care.  A copy of this report was sent to the requesting provider on this date.  Electronically Signed: D. Rowe Robert 06/19/2015, 12:34 PM   I spent a total of 30 minutes in face to face in clinical consultation, greater than 50% of which was counseling/coordinating care for port a cath placement

## 2015-06-19 NOTE — Procedures (Signed)
Interventional Radiology Procedure Note  Procedure: Placement of a right IJ approach single lumen PowerPort.  Tip is positioned at the superior cavoatrial junction and catheter is ready for immediate use.  Complications: No immediate Recommendations:  - Ok to shower tomorrow - Do not submerge for 7 days - Routine line care   Signed,  Heath K. McCullough, MD   

## 2015-06-23 ENCOUNTER — Encounter: Payer: Self-pay | Admitting: Oncology

## 2015-06-23 ENCOUNTER — Other Ambulatory Visit (HOSPITAL_BASED_OUTPATIENT_CLINIC_OR_DEPARTMENT_OTHER): Payer: Medicare Other

## 2015-06-23 ENCOUNTER — Ambulatory Visit: Payer: Medicare Other

## 2015-06-23 ENCOUNTER — Other Ambulatory Visit: Payer: Self-pay | Admitting: *Deleted

## 2015-06-23 ENCOUNTER — Ambulatory Visit (HOSPITAL_BASED_OUTPATIENT_CLINIC_OR_DEPARTMENT_OTHER): Payer: Medicare Other

## 2015-06-23 ENCOUNTER — Ambulatory Visit (HOSPITAL_BASED_OUTPATIENT_CLINIC_OR_DEPARTMENT_OTHER): Payer: Medicare Other | Admitting: Oncology

## 2015-06-23 ENCOUNTER — Ambulatory Visit (HOSPITAL_COMMUNITY)
Admission: RE | Admit: 2015-06-23 | Discharge: 2015-06-23 | Disposition: A | Discharge status: Home / Self Care | Payer: No Typology Code available for payment source | Source: Ambulatory Visit | Attending: Internal Medicine | Admitting: Internal Medicine

## 2015-06-23 VITALS — BP 103/67 | HR 86 | Temp 98.4°F | Resp 19 | Ht 69.0 in | Wt 147.6 lb

## 2015-06-23 DIAGNOSIS — C786 Secondary malignant neoplasm of retroperitoneum and peritoneum: Secondary | ICD-10-CM

## 2015-06-23 DIAGNOSIS — C3491 Malignant neoplasm of unspecified part of right bronchus or lung: Secondary | ICD-10-CM

## 2015-06-23 DIAGNOSIS — Z5111 Encounter for antineoplastic chemotherapy: Secondary | ICD-10-CM

## 2015-06-23 DIAGNOSIS — C787 Secondary malignant neoplasm of liver and intrahepatic bile duct: Secondary | ICD-10-CM

## 2015-06-23 DIAGNOSIS — D509 Iron deficiency anemia, unspecified: Secondary | ICD-10-CM | POA: Diagnosis not present

## 2015-06-23 DIAGNOSIS — C3431 Malignant neoplasm of lower lobe, right bronchus or lung: Secondary | ICD-10-CM

## 2015-06-23 DIAGNOSIS — C342 Malignant neoplasm of middle lobe, bronchus or lung: Secondary | ICD-10-CM

## 2015-06-23 DIAGNOSIS — C3490 Malignant neoplasm of unspecified part of unspecified bronchus or lung: Secondary | ICD-10-CM

## 2015-06-23 DIAGNOSIS — D630 Anemia in neoplastic disease: Secondary | ICD-10-CM

## 2015-06-23 DIAGNOSIS — Z95828 Presence of other vascular implants and grafts: Secondary | ICD-10-CM

## 2015-06-23 DIAGNOSIS — C799 Secondary malignant neoplasm of unspecified site: Secondary | ICD-10-CM

## 2015-06-23 LAB — CBC WITH DIFFERENTIAL/PLATELET
BASO%: 0.3 % (ref 0.0–2.0)
Basophils Absolute: 0 10*3/uL (ref 0.0–0.1)
EOS ABS: 0 10*3/uL (ref 0.0–0.5)
EOS%: 0.1 % (ref 0.0–7.0)
HEMATOCRIT: 25.1 % — AB (ref 38.4–49.9)
HEMOGLOBIN: 7.9 g/dL — AB (ref 13.0–17.1)
LYMPH#: 1.1 10*3/uL (ref 0.9–3.3)
LYMPH%: 6.7 % — ABNORMAL LOW (ref 14.0–49.0)
MCH: 29.9 pg (ref 27.2–33.4)
MCHC: 31.5 g/dL — ABNORMAL LOW (ref 32.0–36.0)
MCV: 95.1 fL (ref 79.3–98.0)
MONO#: 2.1 10*3/uL — AB (ref 0.1–0.9)
MONO%: 13.4 % (ref 0.0–14.0)
NEUT#: 12.4 10*3/uL — ABNORMAL HIGH (ref 1.5–6.5)
NEUT%: 79.5 % — AB (ref 39.0–75.0)
Platelets: 478 10*3/uL — ABNORMAL HIGH (ref 140–400)
RBC: 2.64 10*6/uL — ABNORMAL LOW (ref 4.20–5.82)
RDW: 17.2 % — ABNORMAL HIGH (ref 11.0–14.6)
WBC: 15.6 10*3/uL — ABNORMAL HIGH (ref 4.0–10.3)
nRBC: 0 % (ref 0–0)

## 2015-06-23 LAB — COMPREHENSIVE METABOLIC PANEL
ALBUMIN: 1.6 g/dL — AB (ref 3.5–5.0)
ALK PHOS: 350 U/L — AB (ref 40–150)
ALT: 27 U/L (ref 0–55)
AST: 48 U/L — AB (ref 5–34)
Anion Gap: 9 mEq/L (ref 3–11)
BILIRUBIN TOTAL: 1.02 mg/dL (ref 0.20–1.20)
BUN: 14.1 mg/dL (ref 7.0–26.0)
CALCIUM: 9.2 mg/dL (ref 8.4–10.4)
CO2: 28 mEq/L (ref 22–29)
CREATININE: 1.1 mg/dL (ref 0.7–1.3)
Chloride: 96 mEq/L — ABNORMAL LOW (ref 98–109)
EGFR: 73 mL/min/{1.73_m2} — ABNORMAL LOW (ref >90)
Glucose: 177 mg/dl — ABNORMAL HIGH (ref 70–140)
Potassium: 4 mEq/L (ref 3.5–5.1)
Sodium: 133 mEq/L — ABNORMAL LOW (ref 136–145)
Total Protein: 6.6 g/dL (ref 6.4–8.3)

## 2015-06-23 LAB — PREPARE RBC (CROSSMATCH)

## 2015-06-23 MED ORDER — SODIUM CHLORIDE 0.9% FLUSH
10.0000 mL | INTRAVENOUS | Status: DC | PRN
  Administered 2015-06-23: 10 mL
  Filled 2015-06-23: qty 10

## 2015-06-23 MED ORDER — SODIUM CHLORIDE 0.9 % IJ SOLN
10.0000 mL | INTRAMUSCULAR | Status: DC | PRN
  Administered 2015-06-23: 10 mL via INTRAVENOUS
  Filled 2015-06-23: qty 10

## 2015-06-23 MED ORDER — PALONOSETRON HCL INJECTION 0.25 MG/5ML
INTRAVENOUS | Status: AC
  Filled 2015-06-23: qty 5

## 2015-06-23 MED ORDER — PALONOSETRON HCL INJECTION 0.25 MG/5ML
0.2500 mg | Freq: Once | INTRAVENOUS | Status: AC
  Administered 2015-06-23: 0.25 mg via INTRAVENOUS

## 2015-06-23 MED ORDER — LIDOCAINE-PRILOCAINE 2.5-2.5 % EX CREA: TOPICAL_CREAM | CUTANEOUS | Status: DC

## 2015-06-23 MED ORDER — SODIUM CHLORIDE 0.9 % IV SOLN
370.0000 mg | Freq: Once | INTRAVENOUS | Status: AC
  Administered 2015-06-23: 370 mg via INTRAVENOUS
  Filled 2015-06-23: qty 37

## 2015-06-23 MED ORDER — HEPARIN SOD (PORK) LOCK FLUSH 100 UNIT/ML IV SOLN
500.0000 [IU] | Freq: Once | INTRAVENOUS | Status: AC | PRN
  Administered 2015-06-23: 500 [IU]
  Filled 2015-06-23: qty 5

## 2015-06-23 MED ORDER — SODIUM CHLORIDE 0.9 % IV SOLN
10.0000 mg | Freq: Once | INTRAVENOUS | Status: AC
  Administered 2015-06-23: 10 mg via INTRAVENOUS
  Filled 2015-06-23: qty 1

## 2015-06-23 MED ORDER — SODIUM CHLORIDE 0.9 % IV SOLN
Freq: Once | INTRAVENOUS | Status: AC
  Administered 2015-06-23: 13:00:00 via INTRAVENOUS

## 2015-06-23 MED ORDER — SODIUM CHLORIDE 0.9 % IV SOLN
80.0000 mg/m2 | Freq: Once | INTRAVENOUS | Status: AC
  Administered 2015-06-23: 150 mg via INTRAVENOUS
  Filled 2015-06-23: qty 7.5

## 2015-06-23 NOTE — Patient Instructions (Signed)

## 2015-06-23 NOTE — Progress Notes (Signed)
[facility] [phone]) [phone]   [phone]  OFFICE PROGRESS NOTE  Shane Cowden, MD [place] [address]  DIAGNOSIS: Extensive stage (T3, N3, M1b) small cell lung cancer presented with large right middle and lower lobe mass in addition to extensive mediastinal lymphadenopathy, right pleural effusion as well as extensive liver metastasis and retroperitoneal lymphadenopathy diagnosed in April 2017.  PRIOR THERAPY: None.  CURRENT THERAPY: Systemic chemotherapy with reduced dose carboplatin for AUC of 3 on day 1 and etoposide 60 MG/M2 on days 1, 2 and 3. First dose was given at [facility].  INTERVAL HISTORY: Shane [facility] 65 y.o. male returns to the clinic today for follow-up visit accompanied by his wife. He is here for his second cycle of chemotherapy today. Tolerated his first cycle fairly well. He is currently a resident of the [facility] skilled nursing facility. He is actively participating in physical therapy and feels though he is getting stronger. He denied having any significant chest pain but continues to have shortness breath with exertion was no cough or hemoptysis. He wears oxygen at 3 L/m. The patient denied having any fever or chills. Appetite is fair. Drinks supplements at the skilled nursing facility twice a day. He has no nausea or vomiting. He is here today for evaluation prior to his second cycle of chemotherapy.  MEDICAL HISTORY: Past Medical History  Diagnosis Date  . CERVICAL RADICULOPATHY, RIGHT 08/12/2009  . DIVERTICULOSIS, COLON 08/15/2006  . GERD 08/15/2006  . HYPERLIPIDEMIA 08/15/2006  . HYPERTENSION 08/15/2006  . INTERNAL HEMORRHOIDS 02/29/2008  . SKIN CANCER, HX OF 08/15/2006  . CAP (community acquired pneumonia)     LLL 07/2014  . Empyema lung ([facility])     2016 on left    ALLERGIES:  is allergic to bee venom.  MEDICATIONS:  Current Outpatient Prescriptions  Medication Sig Dispense  Refill  . citalopram (CELEXA) 40 MG tablet Take 0.5 tablets (20 mg total) by mouth daily.    Marland Kitchen EPINEPHrine 0.3 mg/0.3 mL IJ SOAJ injection Inject 0.3 mLs (0.3 mg total) into the muscle once. 1 Device 2  . lactose free nutrition (BOOST PLUS) LIQD Take 237 mLs by mouth 2 (two) times daily between meals.  0  . methylPREDNISolone (MEDROL DOSEPAK) 4 MG TBPK tablet use as instructed. 21 tablet 0  . ondansetron (ZOFRAN) 4 MG tablet Take 1 tablet (4 mg total) by mouth every 8 (eight) hours as needed for nausea or vomiting. 30 tablet 0  . oxyCODONE (ROXICODONE) 5 MG immediate release tablet TAKE 1-2 TABLETS BY MOUTH EVERY 4-6 HOURS AS NEEDED FOR SEVERE PAIN 20 tablet 0  . polyethylene glycol (MIRALAX / GLYCOLAX) packet Take 17 g by mouth daily as needed for mild constipation.    . senna (SENOKOT) 8.6 MG TABS tablet Take 1 tablet (8.6 mg total) by mouth daily.  0  . lidocaine-prilocaine (EMLA) cream Apply to port -a-cath 1-2 hours prior to access. 30 g 2   No current facility-administered medications for this visit.   Facility-Administered Medications Ordered in Other Visits  Medication Dose Route Frequency Provider Last Rate Last Dose  . sodium chloride flush (NS) 0.9 % injection 10 mL  10 mL Intracatheter PRN Curt Bears, MD   10 mL at 06/23/15 1540    SURGICAL HISTORY:  Past Surgical History  Procedure Laterality Date  . Tonsillectomy    . Video bronchoscopy N/A 07/27/2014    Procedure: VIDEO BRONCHOSCOPY;  Surgeon: Shane Argue  Servando Snare, MD;  Location: [facility];  Service: Thoracic;  Laterality: N/A;  . Video assisted thoracoscopy (vats)/empyema Left 07/27/2014    Procedure: VIDEO ASSISTED THORACOSCOPY (VATS)/EMPYEMA Left, drainage pleural effusion.;  Surgeon: Shane Isaac, MD;  Location: [facility];  Service: Thoracic;  Laterality: Left;  . Video bronchoscopy Bilateral 05/20/2015    Procedure: VIDEO BRONCHOSCOPY WITH FLUORO;  Surgeon: Shane Gobble, MD;  Location: [facility];  Service:  Cardiopulmonary;  Laterality: Bilateral;    REVIEW OF SYSTEMS:  Constitutional: positive for anorexia, fatigue and weight loss Eyes: negative Ears, nose, mouth, throat, and face: negative Respiratory: positive for dyspnea on exertion Cardiovascular: negative Gastrointestinal: negative Genitourinary:negative Integument/breast: negative Hematologic/lymphatic: negative Musculoskeletal:positive for muscle weakness Neurological: negative Behavioral/Psych: negative Endocrine: negative Allergic/Immunologic: negative   PHYSICAL EXAMINATION: General appearance: alert, cooperative, fatigued and no distress Head: Normocephalic, without obvious abnormality, atraumatic Neck: no adenopathy, no JVD, supple, symmetrical, trachea midline and thyroid not enlarged, symmetric, no tenderness/mass/nodules Lymph nodes: Cervical, supraclavicular, and axillary nodes normal. Resp: clear to auscultation bilaterally Back: symmetric, no curvature. ROM normal. No CVA tenderness. Cardio: regular rate and rhythm, S1, S2 normal, no murmur, click, rub or gallop GI: soft, non-tender; bowel sounds normal; no masses,  no organomegaly Extremities: extremities normal, atraumatic, no cyanosis or edema Neurologic: Alert and oriented X 3, normal strength and tone. Normal symmetric reflexes. Normal coordination and gait  ECOG PERFORMANCE STATUS: 2 - Symptomatic, <50% confined to bed  Blood pressure 103/67, pulse 86, temperature 98.4 F (36.9 C), temperature source Oral, resp. rate 19, height '5\' 9"'$  (1.753 m), weight 147 lb 9.6 oz (66.951 kg), SpO2 96 %.  LABORATORY DATA: Lab Results  Component Value Date   WBC 15.6* 06/23/2015   HGB 7.9* 06/23/2015   HCT 25.1* 06/23/2015   MCV 95.1 06/23/2015   PLT 478* 06/23/2015      Chemistry      Component Value Date/Time   NA 133* 06/23/2015 1036   NA 137 06/03/2015 0332   K 4.0 06/23/2015 1036   K 4.3 06/03/2015 0332   CL 108 06/03/2015 0332   CO2 28 06/23/2015 1036    CO2 18* 06/03/2015 0332   BUN 14.1 06/23/2015 1036   BUN 89* 06/03/2015 0332   CREATININE 1.1 06/23/2015 1036   CREATININE 1.29* 06/03/2015 0332      Component Value Date/Time   CALCIUM 9.2 06/23/2015 1036   CALCIUM 8.0* 06/03/2015 0332   ALKPHOS 350* 06/23/2015 1036   ALKPHOS 757* 06/02/2015 0334   AST 48* 06/23/2015 1036   AST 585* 06/02/2015 0334   ALT 27 06/23/2015 1036   ALT 121* 06/02/2015 0334   BILITOT 1.02 06/23/2015 1036   BILITOT 4.5* 06/02/2015 0334       RADIOGRAPHIC STUDIES: Dg Chest 2 View  05/29/2015  CLINICAL DATA:  COPD, hypertension, recent diagnosis of small cell lung cancer with metastasis, presented today with fatigue, weakness, weight loss and poor oral intake, hiccups, back pain, abdominal pain, nausea EXAM: CHEST  2 VIEW COMPARISON:  05/19/2015 FINDINGS: Normal heart size and pulmonary vascularity. Mediastinal enlargement corresponding to extensive adenopathy on prior CT. Increased opacity at RIGHT lung base consistent with known tumor, increased atelectasis and pleural effusion. Underlying emphysematous and bronchitic changes. Subsegmental atelectasis and scarring at LEFT base persists. No pneumothorax. Bones demineralized. IMPRESSION: COPD changes with increased RIGHT basilar opacity corresponding to known tumor which increased atelectasis and pleural effusion. Mediastinal adenopathy. Electronically Signed   By: Lavonia Dana M.D.   On: 05/29/2015 19:59  Mr Jeri Cos Wo Contrast  06/03/2015  CLINICAL DATA:  Metastatic lung cancer.  Staging. EXAM: MRI HEAD WITHOUT AND WITH CONTRAST TECHNIQUE: Multiplanar, multiecho pulse sequences of the brain and surrounding structures were obtained without and with intravenous contrast. CONTRAST:  49m MULTIHANCE GADOBENATE DIMEGLUMINE 529 MG/ML IV SOLN COMPARISON:  None. FINDINGS: Ventricle size normal.  Cerebral volume normal for age. Negative for acute infarct. Scattered small nonenhancing white matter hyperintensities  bilaterally most consistent with chronic microvascular ischemia. Negative for intracranial hemorrhage. Negative for edema in the brain. Postcontrast imaging reveals no enhancing brain lesions. Vascular enhancement normal. Leptomeningeal enhancement normal. Bony lesions are present in the clivus and C2 vertebral body which enhance and are consistent with metastatic disease. No other skull lesions identified. IMPRESSION: Metastatic deposits in the clivus and C2 vertebral body. Negative for metastatic disease to the brain. Electronically Signed   By: CFranchot GalloM.D.   On: 06/03/2015 07:17   Ir Fluoro Guide Cv Line Right  06/19/2015  INDICATION: 65year old male with advanced small cell lung cancer and poor venous access in need of durable venous access for chemotherapy. EXAM: IMPLANTED PORT A CATH PLACEMENT WITH ULTRASOUND AND FLUOROSCOPIC GUIDANCE MEDICATIONS: 2 g Ancef; The antibiotic was administered within an appropriate time interval prior to skin puncture. ANESTHESIA/SEDATION: Versed Three mg IV; Fentanyl 50 mcg IV; Moderate Sedation Time:  17 The patient was continuously monitored during the procedure by the interventional radiology nurse under my direct supervision. FLUOROSCOPY TIME:  One minutes, 12 seconds (12 mGy) COMPLICATIONS: None immediate. Estimated blood loss: None PROCEDURE: The right neck and chest was prepped with chlorhexidine, and draped in the usual sterile fashion using maximum barrier technique (cap and mask, sterile gown, sterile gloves, large sterile sheet, hand hygiene and cutaneous antiseptic). Antibiotic prophylaxis was provided with 2g Ancef administered IV one hour prior to skin incision. Local anesthesia was attained by infiltration with 1% lidocaine with epinephrine. Ultrasound demonstrated patency of the right internal jugular vein, and this was documented with an image. Under real-time ultrasound guidance, this vein was accessed with a 21 gauge micropuncture needle and image  documentation was performed. A small dermatotomy was made at the access site with an 11 scalpel. A 0.018" wire was advanced into the SVC and the access needle exchanged for a 11F micropuncture vascular sheath. The 0.018" wire was then removed and a 0.035" wire advanced into the IVC. An appropriate location for the subcutaneous reservoir was selected below the clavicle and an incision was made through the skin and underlying soft tissues. The subcutaneous tissues were then dissected using a combination of blunt and sharp surgical technique and a pocket was formed. A single lumen power injectable portacatheter was then tunneled through the subcutaneous tissues from the pocket to the dermatotomy and the port reservoir placed within the subcutaneous pocket. The venous access site was then serially dilated and a peel away vascular sheath placed over the wire. The wire was removed and the port catheter advanced into position under fluoroscopic guidance. The catheter tip is positioned in the upper right atrium. This was documented with a spot image. The portacatheter was then tested and found to flush and aspirate well. The port was flushed with saline followed by 100 units/mL heparinized saline. The pocket was then closed in two layers using first subdermal inverted interrupted absorbable sutures followed by a running subcuticular suture. The epidermis was then sealed with Dermabond. The dermatotomy at the venous access site was also closed with a single inverted subdermal suture  and the epidermis sealed with Dermabond. IMPRESSION: Successful placement of a right IJ approach Power Port with ultrasound and fluoroscopic guidance. The catheter is ready for use. Signed, Criselda Peaches, MD [facility] Radiology Electronically Signed   By: Jacqulynn Cadet M.D.   On: 06/19/2015 15:24   Ir US Guide Vasc Access Right  06/19/2015  INDICATION: 65 year old male with advanced  small cell lung cancer and poor venous access in need of durable venous access for chemotherapy. EXAM: IMPLANTED PORT A CATH PLACEMENT WITH ULTRASOUND AND FLUOROSCOPIC GUIDANCE MEDICATIONS: 2 g Ancef; The antibiotic was administered within an appropriate time interval prior to skin puncture. ANESTHESIA/SEDATION: Versed Three mg IV; Fentanyl 50 mcg IV; Moderate Sedation Time:  17 The patient was continuously monitored during the procedure by the interventional radiology nurse under my direct supervision. FLUOROSCOPY TIME:  One minutes, 12 seconds (12 mGy) COMPLICATIONS: None immediate. Estimated blood loss: None PROCEDURE: The right neck and chest was prepped with chlorhexidine, and draped in the usual sterile fashion using maximum barrier technique (cap and mask, sterile gown, sterile gloves, large sterile sheet, hand hygiene and cutaneous antiseptic). Antibiotic prophylaxis was provided with 2g Ancef administered IV one hour prior to skin incision. Local anesthesia was attained by infiltration with 1% lidocaine with epinephrine. Ultrasound demonstrated patency of the right internal jugular vein, and this was documented with an image. Under real-time ultrasound guidance, this vein was accessed with a 21 gauge micropuncture needle and image documentation was performed. A small dermatotomy was made at the access site with an 11 scalpel. A 0.018" wire was advanced into the SVC and the access needle exchanged for a 63F micropuncture vascular sheath. The 0.018" wire was then removed and a 0.035" wire advanced into the IVC. An appropriate location for the subcutaneous reservoir was selected below the clavicle and an incision was made through the skin and underlying soft tissues. The subcutaneous tissues were then dissected using a combination of blunt and sharp surgical technique and a pocket was formed. A single lumen power injectable portacatheter was then tunneled through the subcutaneous tissues from the pocket to the  dermatotomy and the port reservoir placed within the subcutaneous pocket. The venous access site was then serially dilated and a peel away vascular sheath placed over the wire. The wire was removed and the port catheter advanced into position under fluoroscopic guidance. The catheter tip is positioned in the upper right atrium. This was documented with a spot image. The portacatheter was then tested and found to flush and aspirate well. The port was flushed with saline followed by 100 units/mL heparinized saline. The pocket was then closed in two layers using first subdermal inverted interrupted absorbable sutures followed by a running subcuticular suture. The epidermis was then sealed with Dermabond. The dermatotomy at the venous access site was also closed with a single inverted subdermal suture and the epidermis sealed with Dermabond. IMPRESSION: Successful placement of a right IJ approach Power Port with ultrasound and fluoroscopic guidance. The catheter is ready for use. Signed, Criselda Peaches, MD [facility] Radiology Electronically Signed   By: Jacqulynn Cadet M.D.   On: 06/19/2015 15:24    ASSESSMENT AND PLAN: This is a very pleasant 65 years old white male recently diagnosed with extensive stage small cell lung cancer status post first cycle of chemotherapy with reduced dose carboplatin and etoposide. The patient tolerated the first cycle well he is feeling much better after starting chemotherapy and  currently at the skilled nursing facility and doing physical exercise.   Patient was seen and discussed with Dr. Julien Nordmann. Recommend that he proceed with cycle 2 of his chemotherapy today. Liver enzymes are improving with treatment. We will increase his carboplatin dose to AUC of 4 and increased the etoposide dose to 80 mg/m with this cycle.  He is anemic with a hemoglobin of 7.9 and he will receive 2 units of packed red blood cells this  week.  The patient will continue to have weekly lab work.  The patient will have a restaging CT scan of the chest abdomen and pelvis prior to cycle 3 to assess response to therapy. The patient will come back for follow-up visit in 3 weeks for evaluation before starting cycle #3 of his chemotherapy and to review his scans.  He was advised to call immediately if he has any concerning symptoms in the interval. The patient voices understanding of current disease status and treatment options and is in agreement with the current care plan.  All questions were answered. The patient knows to call the clinic with any problems, questions or concerns. We can certainly see the patient much sooner if necessary.   Mikey Bussing, DNP, AGPCNP-BC, AOCNP  ADDENDUM: Hematology/Oncology Attending: I had a face to face encounter with the patient today. I recommended his care plan. This is a very pleasant 65 years old white male recently diagnosed with extensive stage small cell lung cancer status post 1 cycle of systemic chemotherapy with reduced dose carboplatin for AUC of 3 on day 1 and etoposide 60 MG/M2 on days 1, 2 and 3. The first dose was given at [facility] secondary to significant thrombocytopenia as well as liver dysfunction. The patient is feeling much better today and there is significant improvement in his platelets count as well as his liver enzymes. I recommended for him to proceed with cycle #2 today but I will increase the dose of carboplatin to AUC of 4 on day 1 and etoposide at 80 MG/M2 on days 1, 2 and 3 with Neulasta support on day 4. For the anemia of neoplastic disease, I will arrange for the patient to receive 2 units of PRBCs transfusion this week. I will see him back for follow-up visit in 3 weeks for reevaluation with repeat CT scan of the chest, abdomen and pelvis for restaging of his disease. He was advised to call immediately if he has any concerning symptoms in the  interval.  Disclaimer: This note was dictated with voice recognition software. Similar sounding words can inadvertently be transcribed and may be missed upon review.  Eilleen Kempf., MD 06/23/2015

## 2015-06-23 NOTE — Patient Instructions (Signed)
[facility] Discharge Instructions for Patients Receiving Chemotherapy  Today you received the following chemotherapy agents:  Carboplatin, Etoposide  To help prevent nausea and vomiting after your treatment, we encourage you to take your nausea medication as prescribed.   If you develop nausea and vomiting that is not controlled by your nausea medication, call the clinic.   BELOW ARE SYMPTOMS THAT SHOULD BE REPORTED IMMEDIATELY:  *FEVER GREATER THAN 100.5 F  *CHILLS WITH OR WITHOUT FEVER  NAUSEA AND VOMITING THAT IS NOT CONTROLLED WITH YOUR NAUSEA MEDICATION  *UNUSUAL SHORTNESS OF BREATH  *UNUSUAL BRUISING OR BLEEDING  TENDERNESS IN MOUTH AND THROAT WITH OR WITHOUT PRESENCE OF ULCERS  *URINARY PROBLEMS  *BOWEL PROBLEMS  UNUSUAL RASH Items with * indicate a potential emergency and should be followed up as soon as possible.  Feel free to call the clinic you have any questions or concerns. The clinic phone number is [phone].  Please show the [facility] at check-in to the Emergency Department and triage nurse.

## 2015-06-24 ENCOUNTER — Other Ambulatory Visit: Payer: Self-pay | Admitting: Medical Oncology

## 2015-06-24 ENCOUNTER — Encounter: Payer: Self-pay | Admitting: Internal Medicine

## 2015-06-24 ENCOUNTER — Ambulatory Visit (HOSPITAL_BASED_OUTPATIENT_CLINIC_OR_DEPARTMENT_OTHER): Payer: Medicare Other

## 2015-06-24 VITALS — BP 116/61 | HR 73 | Temp 97.9°F | Resp 18

## 2015-06-24 DIAGNOSIS — C3491 Malignant neoplasm of unspecified part of right bronchus or lung: Secondary | ICD-10-CM

## 2015-06-24 DIAGNOSIS — R112 Nausea with vomiting, unspecified: Secondary | ICD-10-CM

## 2015-06-24 DIAGNOSIS — D509 Iron deficiency anemia, unspecified: Secondary | ICD-10-CM

## 2015-06-24 DIAGNOSIS — Z5111 Encounter for antineoplastic chemotherapy: Secondary | ICD-10-CM | POA: Diagnosis not present

## 2015-06-24 MED ORDER — ETOPOSIDE CHEMO INJECTION 1 GM/50ML
80.0000 mg/m2 | Freq: Once | INTRAVENOUS | Status: AC
  Administered 2015-06-24: 150 mg via INTRAVENOUS
  Filled 2015-06-24: qty 7.5

## 2015-06-24 MED ORDER — HEPARIN SOD (PORK) LOCK FLUSH 100 UNIT/ML IV SOLN
500.0000 [IU] | Freq: Once | INTRAVENOUS | Status: AC | PRN
  Administered 2015-06-24: 500 [IU]
  Filled 2015-06-24: qty 5

## 2015-06-24 MED ORDER — DIPHENHYDRAMINE HCL 25 MG PO CAPS
ORAL_CAPSULE | ORAL | Status: AC
  Filled 2015-06-24: qty 1

## 2015-06-24 MED ORDER — PROCHLORPERAZINE MALEATE 10 MG PO TABS: 10.0000 mg | ORAL_TABLET | Freq: Four times a day (QID) | ORAL | Status: DC | PRN

## 2015-06-24 MED ORDER — ACETAMINOPHEN 325 MG PO TABS
650.0000 mg | ORAL_TABLET | Freq: Once | ORAL | Status: AC
  Administered 2015-06-24: 650 mg via ORAL

## 2015-06-24 MED ORDER — SODIUM CHLORIDE 0.9% FLUSH
10.0000 mL | INTRAVENOUS | Status: DC | PRN
  Administered 2015-06-24: 10 mL
  Filled 2015-06-24: qty 10

## 2015-06-24 MED ORDER — DIPHENHYDRAMINE HCL 25 MG PO CAPS
25.0000 mg | ORAL_CAPSULE | Freq: Once | ORAL | Status: AC
  Administered 2015-06-24: 25 mg via ORAL

## 2015-06-24 MED ORDER — ACETAMINOPHEN 325 MG PO TABS
ORAL_TABLET | ORAL | Status: AC
  Filled 2015-06-24: qty 2

## 2015-06-24 MED ORDER — SODIUM CHLORIDE 0.9 % IV SOLN
10.0000 mg | Freq: Once | INTRAVENOUS | Status: AC
  Administered 2015-06-24: 10 mg via INTRAVENOUS
  Filled 2015-06-24: qty 1

## 2015-06-24 MED ORDER — SODIUM CHLORIDE 0.9 % IV SOLN
Freq: Once | INTRAVENOUS | Status: AC
  Administered 2015-06-24: 12:00:00 via INTRAVENOUS

## 2015-06-24 NOTE — Progress Notes (Signed)
Introduced myself as his FA.  Informed him that since he has 2 insurances he probably would not need copay assistance but he has my card for any billing questions or concerns.  Mrs. Beckel had a concern about billing in regards to obtaining itemized statements.  She called billing and requested that his account be flagged to send out itemized statements.  Billing told her it wasn't possible and I agreed that the account couldn't be set up that way however, if they're here and would like an itemized statement I can accommodate them.  They verbalized understanding.

## 2015-06-24 NOTE — Progress Notes (Signed)
Patient requested that [facility] be left accessed for tomorrow's treatment. Called [facility] [phone].  Spoke with "Ruthlund" nurse taking care of Shane [facility]quez.  Let her know that he received a blood transfusion today as well as his chemotherapy.  Also let her know that per his request, we left his PAC accessed.  They do not need to do anything to it.  It has a gauze bandage over it.  However, he may not get it wet.   Pt and wife are also aware of this.

## 2015-06-24 NOTE — Patient Instructions (Signed)
[facility] Discharge Instructions for Patients Receiving Chemotherapy  Today you received the following chemotherapy agent, Etoposide.  To help prevent nausea and vomiting after your treatment, we encourage you to take your nausea medication as prescribed.   If you develop nausea and vomiting that is not controlled by your nausea medication, call the clinic.   BELOW ARE SYMPTOMS THAT SHOULD BE REPORTED IMMEDIATELY:  *FEVER GREATER THAN 100.5 F  *CHILLS WITH OR WITHOUT FEVER  NAUSEA AND VOMITING THAT IS NOT CONTROLLED WITH YOUR NAUSEA MEDICATION  *UNUSUAL SHORTNESS OF BREATH  *UNUSUAL BRUISING OR BLEEDING  TENDERNESS IN MOUTH AND THROAT WITH OR WITHOUT PRESENCE OF ULCERS  *URINARY PROBLEMS  *BOWEL PROBLEMS  UNUSUAL RASH Items with * indicate a potential emergency and should be followed up as soon as possible.  Feel free to call the clinic you have any questions or concerns. The clinic phone number is [phone].  Please show the [facility] at check-in to the Emergency Department and triage nurse. Blood Transfusion  A blood transfusion is a procedure in which you receive donated blood through an IV tube. You may need a blood transfusion because of illness, surgery, or injury. The blood may come from a donor, or it may be your own blood that you donated previously. The blood given in a transfusion is made up of different types of cells. You may receive:  Red blood cells. These carry oxygen and replace lost blood.  Platelets. These control bleeding.  Plasma. Thishelps blood to clot. If you have hemophilia or another clotting disorder, you may also receive other types of blood products. LET [facility] CARE PROVIDER KNOW ABOUT:  Any allergies you have.  All medicines you are taking, including vitamins, herbs, eye drops, creams, and over-the-counter medicines.  Previous problems you or members of your family have had with the use of  anesthetics.  Any blood disorders you have.  Previous surgeries you have had.  Any medical conditions you may have.  Any previous reactions you have had during a blood transfusion.  RISKS AND COMPLICATIONS Generally, this is a safe procedure. However, problems may occur, including:  Having an allergic reaction to something in the donated blood.  Fever. This may be a reaction to the white blood cells in the transfused blood.  Iron overload. This can happen from having many transfusions.  Transfusion-related acute lung injury (TRALI). This is a rare reaction that causes lung damage. The cause is not known.TRALI can occur within hours of a transfusion or several days later.  Sudden (acute) or delayed hemolytic reactions. This happens if your blood does not match the cells in your transfusion. Your body's defense system (immune system) may try to attack the new cells. This complication is rare.  Infection. This is rare. BEFORE THE PROCEDURE  You may have a blood test to determine your blood type. This is necessary to know what kind of blood your body will accept.  If you are going to have a planned surgery, you may donate your own blood. This may be done in case you need to have a transfusion.  If you have had an allergic reaction to a transfusion in the past, you may be given medicine to help prevent a reaction. Take this medicine only as directed by your health care provider.  You will have your temperature, blood pressure, and pulse monitored before the transfusion. PROCEDURE   An IV will be started in your hand or arm.  The bag  of donated blood will be attached to your IV tube and given into your vein.  Your temperature, blood pressure, and pulse will be monitored regularly during the transfusion. This monitoring is done to detect early signs of a transfusion reaction.  If you have any signs or symptoms of a reaction, your transfusion will be stopped and you may be given  medicine.  When the transfusion is over, your IV will be removed.  Pressure may be applied to the IV site for a few minutes.  A bandage (dressing) will be applied. The procedure may vary among health care providers and hospitals. AFTER THE PROCEDURE  Your blood pressure, temperature, and pulse will be monitored regularly.   This information is not intended to replace advice given to you by your health care provider. Make sure you discuss any questions you have with your health care provider.   Document Released: 01/30/2000 Document Revised: 02/22/2014 Document Reviewed: 12/12/2013 Elsevier Interactive Patient Education [employer].

## 2015-06-25 ENCOUNTER — Ambulatory Visit (HOSPITAL_BASED_OUTPATIENT_CLINIC_OR_DEPARTMENT_OTHER): Payer: Medicare Other

## 2015-06-25 ENCOUNTER — Other Ambulatory Visit: Payer: Self-pay | Admitting: Medical Oncology

## 2015-06-25 VITALS — BP 143/88 | HR 70 | Temp 97.0°F | Resp 18

## 2015-06-25 DIAGNOSIS — Z5111 Encounter for antineoplastic chemotherapy: Secondary | ICD-10-CM

## 2015-06-25 DIAGNOSIS — C3491 Malignant neoplasm of unspecified part of right bronchus or lung: Secondary | ICD-10-CM

## 2015-06-25 DIAGNOSIS — D649 Anemia, unspecified: Secondary | ICD-10-CM | POA: Diagnosis not present

## 2015-06-25 DIAGNOSIS — D509 Iron deficiency anemia, unspecified: Secondary | ICD-10-CM | POA: Diagnosis not present

## 2015-06-25 MED ORDER — DIPHENHYDRAMINE HCL 25 MG PO CAPS
25.0000 mg | ORAL_CAPSULE | Freq: Once | ORAL | Status: AC
  Administered 2015-06-25: 25 mg via ORAL

## 2015-06-25 MED ORDER — SODIUM CHLORIDE 0.9 % IV SOLN
10.0000 mg | Freq: Once | INTRAVENOUS | Status: AC
  Administered 2015-06-25: 10 mg via INTRAVENOUS
  Filled 2015-06-25: qty 1

## 2015-06-25 MED ORDER — DIPHENHYDRAMINE HCL 25 MG PO CAPS
ORAL_CAPSULE | ORAL | Status: AC
  Filled 2015-06-25: qty 1

## 2015-06-25 MED ORDER — SODIUM CHLORIDE 0.9 % IV SOLN
Freq: Once | INTRAVENOUS | Status: AC
  Administered 2015-06-25: 12:00:00 via INTRAVENOUS

## 2015-06-25 MED ORDER — SODIUM CHLORIDE 0.9 % IV SOLN: 250.0000 mL | Freq: Once | INTRAVENOUS | Status: DC

## 2015-06-25 MED ORDER — SODIUM CHLORIDE 0.9 % IV SOLN
250.0000 mL | Freq: Once | INTRAVENOUS | Status: AC
  Administered 2015-06-25: 250 mL via INTRAVENOUS

## 2015-06-25 MED ORDER — HEPARIN SOD (PORK) LOCK FLUSH 100 UNIT/ML IV SOLN
500.0000 [IU] | Freq: Once | INTRAVENOUS | Status: AC | PRN
  Administered 2015-06-25: 500 [IU]
  Filled 2015-06-25: qty 5

## 2015-06-25 MED ORDER — ACETAMINOPHEN 325 MG PO TABS
ORAL_TABLET | ORAL | Status: AC
  Filled 2015-06-25: qty 2

## 2015-06-25 MED ORDER — ACETAMINOPHEN 325 MG PO TABS
650.0000 mg | ORAL_TABLET | Freq: Once | ORAL | Status: AC
  Administered 2015-06-25: 650 mg via ORAL

## 2015-06-25 MED ORDER — SODIUM CHLORIDE 0.9 % IV SOLN
80.0000 mg/m2 | Freq: Once | INTRAVENOUS | Status: AC
  Administered 2015-06-25: 150 mg via INTRAVENOUS
  Filled 2015-06-25: qty 7.5

## 2015-06-25 MED ORDER — SODIUM CHLORIDE 0.9% FLUSH
10.0000 mL | INTRAVENOUS | Status: DC | PRN
  Administered 2015-06-25: 10 mL
  Filled 2015-06-25: qty 10

## 2015-06-25 NOTE — Patient Instructions (Signed)
[facility] Discharge Instructions for Patients Receiving Chemotherapy  Today you received the following chemotherapy agent, Etoposide.  To help prevent nausea and vomiting after your treatment, we encourage you to take your nausea medication as prescribed.   If you develop nausea and vomiting that is not controlled by your nausea medication, call the clinic.   BELOW ARE SYMPTOMS THAT SHOULD BE REPORTED IMMEDIATELY:  *FEVER GREATER THAN 100.5 F  *CHILLS WITH OR WITHOUT FEVER  NAUSEA AND VOMITING THAT IS NOT CONTROLLED WITH YOUR NAUSEA MEDICATION  *UNUSUAL SHORTNESS OF BREATH  *UNUSUAL BRUISING OR BLEEDING  TENDERNESS IN MOUTH AND THROAT WITH OR WITHOUT PRESENCE OF ULCERS  *URINARY PROBLEMS  *BOWEL PROBLEMS  UNUSUAL RASH Items with * indicate a potential emergency and should be followed up as soon as possible.  Feel free to call the clinic you have any questions or concerns. The clinic phone number is [phone].  Please show the [facility] at check-in to the Emergency Department and triage nurse. Blood Transfusion  A blood transfusion is a procedure in which you receive donated blood through an IV tube. You may need a blood transfusion because of illness, surgery, or injury. The blood may come from a donor, or it may be your own blood that you donated previously. The blood given in a transfusion is made up of different types of cells. You may receive:  Red blood cells. These carry oxygen and replace lost blood.  Platelets. These control bleeding.  Plasma. Thishelps blood to clot. If you have hemophilia or another clotting disorder, you may also receive other types of blood products. LET [facility] CARE PROVIDER KNOW ABOUT:  Any allergies you have.  All medicines you are taking, including vitamins, herbs, eye drops, creams, and over-the-counter medicines.  Previous problems you or members of your family have had with the use of  anesthetics.  Any blood disorders you have.  Previous surgeries you have had.  Any medical conditions you may have.  Any previous reactions you have had during a blood transfusion.  RISKS AND COMPLICATIONS Generally, this is a safe procedure. However, problems may occur, including:  Having an allergic reaction to something in the donated blood.  Fever. This may be a reaction to the white blood cells in the transfused blood.  Iron overload. This can happen from having many transfusions.  Transfusion-related acute lung injury (TRALI). This is a rare reaction that causes lung damage. The cause is not known.TRALI can occur within hours of a transfusion or several days later.  Sudden (acute) or delayed hemolytic reactions. This happens if your blood does not match the cells in your transfusion. Your body's defense system (immune system) may try to attack the new cells. This complication is rare.  Infection. This is rare. BEFORE THE PROCEDURE  You may have a blood test to determine your blood type. This is necessary to know what kind of blood your body will accept.  If you are going to have a planned surgery, you may donate your own blood. This may be done in case you need to have a transfusion.  If you have had an allergic reaction to a transfusion in the past, you may be given medicine to help prevent a reaction. Take this medicine only as directed by your health care provider.  You will have your temperature, blood pressure, and pulse monitored before the transfusion. PROCEDURE   An IV will be started in your hand or arm.  The bag  of donated blood will be attached to your IV tube and given into your vein.  Your temperature, blood pressure, and pulse will be monitored regularly during the transfusion. This monitoring is done to detect early signs of a transfusion reaction.  If you have any signs or symptoms of a reaction, your transfusion will be stopped and you may be given  medicine.  When the transfusion is over, your IV will be removed.  Pressure may be applied to the IV site for a few minutes.  A bandage (dressing) will be applied. The procedure may vary among health care providers and hospitals. AFTER THE PROCEDURE  Your blood pressure, temperature, and pulse will be monitored regularly.   This information is not intended to replace advice given to you by your health care provider. Make sure you discuss any questions you have with your health care provider.   Document Released: 01/30/2000 Document Revised: 02/22/2014 Document Reviewed: 12/12/2013 Elsevier Interactive Patient Education 2016 [place].   Blood Transfusion  A blood transfusion is a procedure in which you receive donated blood through an IV tube. You may need a blood transfusion because of illness, surgery, or injury. The blood may come from a donor, or it may be your own blood that you donated previously. The blood given in a transfusion is made up of different types of cells. You may receive:  Red blood cells. These carry oxygen and replace lost blood.  Platelets. These control bleeding.  Plasma. Thishelps blood to clot. If you have hemophilia or another clotting disorder, you may also receive other types of blood products. LET [facility] CARE PROVIDER KNOW ABOUT:  Any allergies you have.  All medicines you are taking, including vitamins, herbs, eye drops, creams, and over-the-counter medicines.  Previous problems you or members of your family have had with the use of anesthetics.  Any blood disorders you have.  Previous surgeries you have had.  Any medical conditions you may have.  Any previous reactions you have had during a blood transfusion.  RISKS AND COMPLICATIONS Generally, this is a safe procedure. However, problems may occur, including:  Having an allergic reaction to something in the donated blood.  Fever. This may be a reaction to the white blood cells  in the transfused blood.  Iron overload. This can happen from having many transfusions.  Transfusion-related acute lung injury (TRALI). This is a rare reaction that causes lung damage. The cause is not known.TRALI can occur within hours of a transfusion or several days later.  Sudden (acute) or delayed hemolytic reactions. This happens if your blood does not match the cells in your transfusion. Your body's defense system (immune system) may try to attack the new cells. This complication is rare.  Infection. This is rare. BEFORE THE PROCEDURE  You may have a blood test to determine your blood type. This is necessary to know what kind of blood your body will accept.  If you are going to have a planned surgery, you may donate your own blood. This may be done in case you need to have a transfusion.  If you have had an allergic reaction to a transfusion in the past, you may be given medicine to help prevent a reaction. Take this medicine only as directed by your health care provider.  You will have your temperature, blood pressure, and pulse monitored before the transfusion. PROCEDURE   An IV will be started in your hand or arm.  The bag of donated blood will be  attached to your IV tube and given into your vein.  Your temperature, blood pressure, and pulse will be monitored regularly during the transfusion. This monitoring is done to detect early signs of a transfusion reaction.  If you have any signs or symptoms of a reaction, your transfusion will be stopped and you may be given medicine.  When the transfusion is over, your IV will be removed.  Pressure may be applied to the IV site for a few minutes.  A bandage (dressing) will be applied. The procedure may vary among health care providers and hospitals. AFTER THE PROCEDURE  Your blood pressure, temperature, and pulse will be monitored regularly.   This information is not intended to replace advice given to you by your health care  provider. Make sure you discuss any questions you have with your health care provider.   Document Released: 01/30/2000 Document Revised: 02/22/2014 Document Reviewed: 12/12/2013 Elsevier Interactive Patient Education [employer].

## 2015-06-26 ENCOUNTER — Telehealth: Payer: Self-pay | Admitting: Medical Oncology

## 2015-06-26 ENCOUNTER — Ambulatory Visit (HOSPITAL_BASED_OUTPATIENT_CLINIC_OR_DEPARTMENT_OTHER): Payer: Medicare Other

## 2015-06-26 VITALS — BP 114/64 | HR 84 | Temp 98.1°F

## 2015-06-26 DIAGNOSIS — C3491 Malignant neoplasm of unspecified part of right bronchus or lung: Secondary | ICD-10-CM

## 2015-06-26 DIAGNOSIS — Z5189 Encounter for other specified aftercare: Secondary | ICD-10-CM

## 2015-06-26 LAB — TYPE AND SCREEN
ABO/RH(D): O POS
Antibody Screen: NEGATIVE
Unit division: 0
Unit division: 0

## 2015-06-26 MED ORDER — PEGFILGRASTIM INJECTION 6 MG/0.6ML ~~LOC~~
6.0000 mg | PREFILLED_SYRINGE | Freq: Once | SUBCUTANEOUS | Status: AC
Start: 2015-06-26 — End: 2015-06-26
  Administered 2015-06-26: 6 mg via SUBCUTANEOUS
  Filled 2015-06-26: qty 0.6

## 2015-06-26 NOTE — Telephone Encounter (Signed)
Shane Vasquez is doing better at [facility] . He is walking independently. He will be discharged soon. I told [name] that Dr Julien Nordmann is in agreement with what facility provider recommends re discharge to home.

## 2015-06-27 ENCOUNTER — Ambulatory Visit: Payer: Medicare Other

## 2015-06-30 ENCOUNTER — Other Ambulatory Visit (HOSPITAL_BASED_OUTPATIENT_CLINIC_OR_DEPARTMENT_OTHER): Payer: Medicare Other

## 2015-06-30 ENCOUNTER — Ambulatory Visit (HOSPITAL_BASED_OUTPATIENT_CLINIC_OR_DEPARTMENT_OTHER): Payer: Medicare Other

## 2015-06-30 DIAGNOSIS — C3491 Malignant neoplasm of unspecified part of right bronchus or lung: Secondary | ICD-10-CM | POA: Diagnosis present

## 2015-06-30 DIAGNOSIS — Z95828 Presence of other vascular implants and grafts: Secondary | ICD-10-CM

## 2015-06-30 LAB — COMPREHENSIVE METABOLIC PANEL
ALT: 34 U/L (ref 0–55)
ANION GAP: 7 meq/L (ref 3–11)
AST: 38 U/L — ABNORMAL HIGH (ref 5–34)
Albumin: 2.2 g/dL — ABNORMAL LOW (ref 3.5–5.0)
Alkaline Phosphatase: 328 U/L — ABNORMAL HIGH (ref 40–150)
BUN: 19.8 mg/dL (ref 7.0–26.0)
CALCIUM: 8.4 mg/dL (ref 8.4–10.4)
CHLORIDE: 103 meq/L (ref 98–109)
CO2: 27 meq/L (ref 22–29)
CREATININE: 0.8 mg/dL (ref 0.7–1.3)
Glucose: 140 mg/dl (ref 70–140)
POTASSIUM: 3.9 meq/L (ref 3.5–5.1)
Sodium: 136 mEq/L (ref 136–145)
Total Bilirubin: 0.89 mg/dL (ref 0.20–1.20)
Total Protein: 6.3 g/dL — ABNORMAL LOW (ref 6.4–8.3)

## 2015-06-30 LAB — CBC WITH DIFFERENTIAL/PLATELET
BASO%: 1.2 % (ref 0.0–2.0)
BASOS ABS: 0.1 10*3/uL (ref 0.0–0.1)
EOS ABS: 0 10*3/uL (ref 0.0–0.5)
EOS%: 0.2 % (ref 0.0–7.0)
HEMATOCRIT: 31.9 % — AB (ref 38.4–49.9)
HGB: 10.3 g/dL — ABNORMAL LOW (ref 13.0–17.1)
LYMPH#: 0.7 10*3/uL — AB (ref 0.9–3.3)
LYMPH%: 16.5 % (ref 14.0–49.0)
MCH: 30 pg (ref 27.2–33.4)
MCHC: 32.1 g/dL (ref 32.0–36.0)
MCV: 93.4 fL (ref 79.3–98.0)
MONO#: 0.1 10*3/uL (ref 0.1–0.9)
MONO%: 2.6 % (ref 0.0–14.0)
NEUT#: 3.3 10*3/uL (ref 1.5–6.5)
NEUT%: 79.5 % — AB (ref 39.0–75.0)
PLATELETS: 156 10*3/uL (ref 140–400)
RBC: 3.42 10*6/uL — AB (ref 4.20–5.82)
RDW: 16 % — ABNORMAL HIGH (ref 11.0–14.6)
WBC: 4.2 10*3/uL (ref 4.0–10.3)

## 2015-06-30 MED ORDER — HEPARIN SOD (PORK) LOCK FLUSH 100 UNIT/ML IV SOLN
500.0000 [IU] | Freq: Once | INTRAVENOUS | Status: AC | PRN
  Administered 2015-06-30: 500 [IU] via INTRAVENOUS
  Filled 2015-06-30: qty 5

## 2015-06-30 MED ORDER — SODIUM CHLORIDE 0.9 % IJ SOLN
10.0000 mL | INTRAMUSCULAR | Status: DC | PRN
  Administered 2015-06-30: 10 mL via INTRAVENOUS
  Filled 2015-06-30: qty 10

## 2015-06-30 NOTE — Patient Instructions (Signed)

## 2015-07-01 ENCOUNTER — Telehealth: Payer: Self-pay | Admitting: *Deleted

## 2015-07-01 NOTE — Telephone Encounter (Signed)
Pt called with following concerns:   1.Pt would like to have all labs drawn from his PAC.  2. Pt is to have injection appt for neulasta after day 3 of chemo. No injection appts on pt schedule.   POF sent for flush appt to be added to all future lab appts and Injection appt on June 2 and June 22.  Discussed with pt's wife I will inform scheduling. No further concerns.

## 2015-07-07 ENCOUNTER — Other Ambulatory Visit (HOSPITAL_BASED_OUTPATIENT_CLINIC_OR_DEPARTMENT_OTHER): Payer: Medicare Other

## 2015-07-07 ENCOUNTER — Ambulatory Visit (HOSPITAL_BASED_OUTPATIENT_CLINIC_OR_DEPARTMENT_OTHER): Payer: Medicare Other

## 2015-07-07 DIAGNOSIS — Z95828 Presence of other vascular implants and grafts: Secondary | ICD-10-CM

## 2015-07-07 DIAGNOSIS — C3491 Malignant neoplasm of unspecified part of right bronchus or lung: Secondary | ICD-10-CM

## 2015-07-07 LAB — COMPREHENSIVE METABOLIC PANEL
ALBUMIN: 2.6 g/dL — AB (ref 3.5–5.0)
ALT: 26 U/L (ref 0–55)
ANION GAP: 10 meq/L (ref 3–11)
AST: 30 U/L (ref 5–34)
Alkaline Phosphatase: 292 U/L — ABNORMAL HIGH (ref 40–150)
BILIRUBIN TOTAL: 0.49 mg/dL (ref 0.20–1.20)
BUN: 14.2 mg/dL (ref 7.0–26.0)
CALCIUM: 9 mg/dL (ref 8.4–10.4)
CHLORIDE: 101 meq/L (ref 98–109)
CO2: 26 mEq/L (ref 22–29)
CREATININE: 0.9 mg/dL (ref 0.7–1.3)
EGFR: 85 mL/min/{1.73_m2} — ABNORMAL LOW (ref >90)
Glucose: 125 mg/dl (ref 70–140)
Potassium: 3.9 mEq/L (ref 3.5–5.1)
Sodium: 138 mEq/L (ref 136–145)
Total Protein: 6.9 g/dL (ref 6.4–8.3)

## 2015-07-07 LAB — CBC WITH DIFFERENTIAL/PLATELET
BASO%: 0.7 % (ref 0.0–2.0)
Basophils Absolute: 0.1 10*3/uL (ref 0.0–0.1)
EOS ABS: 0 10*3/uL (ref 0.0–0.5)
EOS%: 0.2 % (ref 0.0–7.0)
HEMATOCRIT: 30.8 % — AB (ref 38.4–49.9)
HEMOGLOBIN: 9.9 g/dL — AB (ref 13.0–17.1)
LYMPH#: 1.8 10*3/uL (ref 0.9–3.3)
LYMPH%: 9.6 % — ABNORMAL LOW (ref 14.0–49.0)
MCH: 30.4 pg (ref 27.2–33.4)
MCHC: 32.1 g/dL (ref 32.0–36.0)
MCV: 94.5 fL (ref 79.3–98.0)
MONO#: 2.5 10*3/uL — AB (ref 0.1–0.9)
MONO%: 13.6 % (ref 0.0–14.0)
NEUT%: 75.9 % — ABNORMAL HIGH (ref 39.0–75.0)
NEUTROS ABS: 13.9 10*3/uL — AB (ref 1.5–6.5)
NRBC: 1 % — AB (ref 0–0)
PLATELETS: 168 10*3/uL (ref 140–400)
RBC: 3.26 10*6/uL — ABNORMAL LOW (ref 4.20–5.82)
RDW: 16.9 % — AB (ref 11.0–14.6)
WBC: 18.3 10*3/uL — AB (ref 4.0–10.3)

## 2015-07-07 MED ORDER — HEPARIN SOD (PORK) LOCK FLUSH 100 UNIT/ML IV SOLN
500.0000 [IU] | Freq: Once | INTRAVENOUS | Status: AC | PRN
  Administered 2015-07-07: 500 [IU] via INTRAVENOUS
  Filled 2015-07-07: qty 5

## 2015-07-07 MED ORDER — SODIUM CHLORIDE 0.9 % IJ SOLN
10.0000 mL | INTRAMUSCULAR | Status: DC | PRN
  Administered 2015-07-07: 10 mL via INTRAVENOUS
  Filled 2015-07-07: qty 10

## 2015-07-07 NOTE — Patient Instructions (Signed)

## 2015-07-11 ENCOUNTER — Ambulatory Visit (HOSPITAL_COMMUNITY)
Admission: RE | Admit: 2015-07-11 | Discharge: 2015-07-11 | Disposition: A | Discharge status: Home / Self Care | Payer: Medicare Other | Source: Ambulatory Visit | Attending: Oncology | Admitting: Oncology

## 2015-07-11 ENCOUNTER — Telehealth: Payer: Self-pay | Admitting: *Deleted

## 2015-07-11 DIAGNOSIS — J910 Malignant pleural effusion: Secondary | ICD-10-CM | POA: Insufficient documentation

## 2015-07-11 DIAGNOSIS — K5730 Diverticulosis of large intestine without perforation or abscess without bleeding: Secondary | ICD-10-CM | POA: Diagnosis not present

## 2015-07-11 DIAGNOSIS — C3491 Malignant neoplasm of unspecified part of right bronchus or lung: Secondary | ICD-10-CM | POA: Diagnosis not present

## 2015-07-11 DIAGNOSIS — K769 Liver disease, unspecified: Secondary | ICD-10-CM | POA: Insufficient documentation

## 2015-07-11 DIAGNOSIS — I2510 Atherosclerotic heart disease of native coronary artery without angina pectoris: Secondary | ICD-10-CM | POA: Insufficient documentation

## 2015-07-11 DIAGNOSIS — R590 Localized enlarged lymph nodes: Secondary | ICD-10-CM | POA: Diagnosis not present

## 2015-07-11 MED ORDER — IOPAMIDOL (ISOVUE-300) INJECTION 61%
100.0000 mL | Freq: Once | INTRAVENOUS | Status: AC | PRN
  Administered 2015-07-11: 100 mL via INTRAVENOUS

## 2015-07-11 NOTE — Telephone Encounter (Signed)
I left a message for pt to try OTC benadryl.

## 2015-07-11 NOTE — Telephone Encounter (Signed)
Patient called requesting if he can get something to help him sleep. He takes his pain medication at night but it is not helping.

## 2015-07-15 ENCOUNTER — Other Ambulatory Visit (HOSPITAL_BASED_OUTPATIENT_CLINIC_OR_DEPARTMENT_OTHER): Payer: Medicare Other

## 2015-07-15 ENCOUNTER — Ambulatory Visit: Payer: Medicare Other

## 2015-07-15 ENCOUNTER — Ambulatory Visit (HOSPITAL_BASED_OUTPATIENT_CLINIC_OR_DEPARTMENT_OTHER): Payer: Medicare Other

## 2015-07-15 ENCOUNTER — Ambulatory Visit (HOSPITAL_BASED_OUTPATIENT_CLINIC_OR_DEPARTMENT_OTHER): Payer: Medicare Other | Admitting: Oncology

## 2015-07-15 ENCOUNTER — Encounter: Payer: Self-pay | Admitting: Oncology

## 2015-07-15 VITALS — BP 122/56 | HR 72 | Temp 98.4°F | Resp 18 | Ht 69.0 in | Wt 146.5 lb

## 2015-07-15 DIAGNOSIS — D6481 Anemia due to antineoplastic chemotherapy: Secondary | ICD-10-CM

## 2015-07-15 DIAGNOSIS — Z5111 Encounter for antineoplastic chemotherapy: Secondary | ICD-10-CM | POA: Diagnosis present

## 2015-07-15 DIAGNOSIS — C3431 Malignant neoplasm of lower lobe, right bronchus or lung: Secondary | ICD-10-CM | POA: Diagnosis not present

## 2015-07-15 DIAGNOSIS — C342 Malignant neoplasm of middle lobe, bronchus or lung: Secondary | ICD-10-CM

## 2015-07-15 DIAGNOSIS — J90 Pleural effusion, not elsewhere classified: Secondary | ICD-10-CM

## 2015-07-15 DIAGNOSIS — C7989 Secondary malignant neoplasm of other specified sites: Secondary | ICD-10-CM | POA: Diagnosis not present

## 2015-07-15 DIAGNOSIS — Z95828 Presence of other vascular implants and grafts: Secondary | ICD-10-CM

## 2015-07-15 DIAGNOSIS — C3491 Malignant neoplasm of unspecified part of right bronchus or lung: Secondary | ICD-10-CM

## 2015-07-15 LAB — COMPREHENSIVE METABOLIC PANEL
ALBUMIN: 2.7 g/dL — AB (ref 3.5–5.0)
ALK PHOS: 228 U/L — AB (ref 40–150)
ALT: 15 U/L (ref 0–55)
ANION GAP: 8 meq/L (ref 3–11)
AST: 26 U/L (ref 5–34)
BUN: 8.5 mg/dL (ref 7.0–26.0)
CALCIUM: 8.8 mg/dL (ref 8.4–10.4)
CO2: 24 mEq/L (ref 22–29)
CREATININE: 1 mg/dL (ref 0.7–1.3)
Chloride: 104 mEq/L (ref 98–109)
EGFR: 79 mL/min/{1.73_m2} — ABNORMAL LOW (ref >90)
Glucose: 99 mg/dl (ref 70–140)
Potassium: 4.5 mEq/L (ref 3.5–5.1)
Sodium: 136 mEq/L (ref 136–145)
Total Bilirubin: 0.51 mg/dL (ref 0.20–1.20)
Total Protein: 7 g/dL (ref 6.4–8.3)

## 2015-07-15 LAB — CBC WITH DIFFERENTIAL/PLATELET
BASO%: 0.7 % (ref 0.0–2.0)
BASOS ABS: 0.1 10*3/uL (ref 0.0–0.1)
EOS%: 0.3 % (ref 0.0–7.0)
Eosinophils Absolute: 0 10*3/uL (ref 0.0–0.5)
HEMATOCRIT: 27.3 % — AB (ref 38.4–49.9)
HEMOGLOBIN: 8.9 g/dL — AB (ref 13.0–17.1)
LYMPH#: 1.6 10*3/uL (ref 0.9–3.3)
LYMPH%: 12.3 % — ABNORMAL LOW (ref 14.0–49.0)
MCH: 30.2 pg (ref 27.2–33.4)
MCHC: 32.5 g/dL (ref 32.0–36.0)
MCV: 92.9 fL (ref 79.3–98.0)
MONO#: 1.7 10*3/uL — AB (ref 0.1–0.9)
MONO%: 13.7 % (ref 0.0–14.0)
NEUT#: 9.3 10*3/uL — ABNORMAL HIGH (ref 1.5–6.5)
NEUT%: 73 % (ref 39.0–75.0)
PLATELETS: 452 10*3/uL — AB (ref 140–400)
RBC: 2.94 10*6/uL — ABNORMAL LOW (ref 4.20–5.82)
RDW: 17.1 % — AB (ref 11.0–14.6)
WBC: 12.7 10*3/uL — ABNORMAL HIGH (ref 4.0–10.3)

## 2015-07-15 MED ORDER — SODIUM CHLORIDE 0.9 % IV SOLN
100.0000 mg/m2 | Freq: Once | INTRAVENOUS | Status: AC
  Administered 2015-07-15: 190 mg via INTRAVENOUS
  Filled 2015-07-15: qty 9.5

## 2015-07-15 MED ORDER — SODIUM CHLORIDE 0.9% FLUSH
10.0000 mL | INTRAVENOUS | Status: DC | PRN
  Administered 2015-07-15: 10 mL via INTRAVENOUS
  Filled 2015-07-15: qty 10

## 2015-07-15 MED ORDER — SODIUM CHLORIDE 0.9% FLUSH
10.0000 mL | INTRAVENOUS | Status: DC | PRN
  Administered 2015-07-15: 10 mL
  Filled 2015-07-15: qty 10

## 2015-07-15 MED ORDER — HEPARIN SOD (PORK) LOCK FLUSH 100 UNIT/ML IV SOLN
500.0000 [IU] | Freq: Once | INTRAVENOUS | Status: AC | PRN
  Administered 2015-07-15: 500 [IU]
  Filled 2015-07-15: qty 5

## 2015-07-15 MED ORDER — SODIUM CHLORIDE 0.9 % IV SOLN
Freq: Once | INTRAVENOUS | Status: AC
  Administered 2015-07-15: 13:00:00 via INTRAVENOUS

## 2015-07-15 MED ORDER — SODIUM CHLORIDE 0.9 % IV SOLN
10.0000 mg | Freq: Once | INTRAVENOUS | Status: AC
  Administered 2015-07-15: 10 mg via INTRAVENOUS
  Filled 2015-07-15: qty 1

## 2015-07-15 MED ORDER — PALONOSETRON HCL INJECTION 0.25 MG/5ML
INTRAVENOUS | Status: AC
  Filled 2015-07-15: qty 5

## 2015-07-15 MED ORDER — SODIUM CHLORIDE 0.9 % IV SOLN
492.5000 mg | Freq: Once | INTRAVENOUS | Status: AC
  Administered 2015-07-15: 490 mg via INTRAVENOUS
  Filled 2015-07-15: qty 49

## 2015-07-15 MED ORDER — PALONOSETRON HCL INJECTION 0.25 MG/5ML
0.2500 mg | Freq: Once | INTRAVENOUS | Status: AC
  Administered 2015-07-15: 0.25 mg via INTRAVENOUS

## 2015-07-15 NOTE — Patient Instructions (Signed)
[facility] Discharge Instructions for Patients Receiving Chemotherapy  Today you received the following chemotherapy agents :  Carboplatin,  Etoposide.  To help prevent nausea and vomiting after your treatment, we encourage you to take your nausea medication as prescribed.   If you develop nausea and vomiting that is not controlled by your nausea medication, call the clinic.   BELOW ARE SYMPTOMS THAT SHOULD BE REPORTED IMMEDIATELY:  *FEVER GREATER THAN 100.5 F  *CHILLS WITH OR WITHOUT FEVER  NAUSEA AND VOMITING THAT IS NOT CONTROLLED WITH YOUR NAUSEA MEDICATION  *UNUSUAL SHORTNESS OF BREATH  *UNUSUAL BRUISING OR BLEEDING  TENDERNESS IN MOUTH AND THROAT WITH OR WITHOUT PRESENCE OF ULCERS  *URINARY PROBLEMS  *BOWEL PROBLEMS  UNUSUAL RASH Items with * indicate a potential emergency and should be followed up as soon as possible.  Feel free to call the clinic you have any questions or concerns. The clinic phone number is [phone].  Please show the [facility] at check-in to the Emergency Department and triage nurse.

## 2015-07-15 NOTE — Progress Notes (Signed)
[place] [phone]   [phone]  OFFICE PROGRESS NOTE  [name], MD [place] [address]  DIAGNOSIS: Extensive stage (T3, N3, M1b) small cell lung cancer presented with large right middle and lower lobe mass in addition to extensive mediastinal lymphadenopathy, right pleural effusion as well as extensive liver metastasis and retroperitoneal lymphadenopathy diagnosed in April 2017.  PRIOR THERAPY: None.  CURRENT THERAPY: Systemic chemotherapy with reduced dose carboplatin for AUC of 3 on day 1 and etoposide 60 MG/M2 on days 1, 2 and 3. First dose was given at [facility]. He has completed 2 cycles of his chemotherapy.  INTERVAL HISTORY: Shane Vasquez 65 y.o. male returns to the clinic today for follow-up visit accompanied by his wife and brother. He is here for his third cycle of chemotherapy today and to review recent restaging CT scans. Tolerated his last cycle fairly well. He has completed physical therapy and is now home from the rehabilitation facility. He feels as though he is getting stronger and that he is eating well at home. He denied having any significant chest pain, shortness breath, cough or hemoptysis. He is no longer wearing oxygen The patient denied having any fever or chills. He has no nausea or vomiting. He is here today for evaluation prior to his third cycle of chemotherapy and to review his recent restaging CT scans.  MEDICAL HISTORY: Past Medical History  Diagnosis Date  . CERVICAL RADICULOPATHY, RIGHT 08/12/2009  . DIVERTICULOSIS, COLON 08/15/2006  . GERD 08/15/2006  . HYPERLIPIDEMIA 08/15/2006  . HYPERTENSION 08/15/2006  . INTERNAL HEMORRHOIDS 02/29/2008  . SKIN CANCER, HX OF 08/15/2006  . CAP (community acquired pneumonia)     LLL 07/2014  . Empyema lung ([facility])     2016 on left    ALLERGIES:  is allergic to bee venom.  MEDICATIONS:  Current Outpatient Prescriptions    Medication Sig Dispense Refill  . citalopram (CELEXA) 40 MG tablet Take 0.5 tablets (20 mg total) by mouth daily.    Marland Kitchen EPINEPHrine 0.3 mg/0.3 mL IJ SOAJ injection Inject 0.3 mLs (0.3 mg total) into the muscle once. 1 Device 2  . lactose free nutrition (BOOST PLUS) LIQD Take 237 mLs by mouth 2 (two) times daily between meals.  0  . lidocaine-prilocaine (EMLA) cream Apply to port -a-cath 1-2 hours prior to access. 30 g 2  . methylPREDNISolone (MEDROL DOSEPAK) 4 MG TBPK tablet use as instructed. 21 tablet 0  . ondansetron (ZOFRAN) 4 MG tablet Take 1 tablet (4 mg total) by mouth every 8 (eight) hours as needed for nausea or vomiting. 30 tablet 0  . oxyCODONE (ROXICODONE) 5 MG immediate release tablet TAKE 1-2 TABLETS BY MOUTH EVERY 4-6 HOURS AS NEEDED FOR SEVERE PAIN 20 tablet 0  . polyethylene glycol (MIRALAX / GLYCOLAX) packet Take 17 g by mouth daily as needed for mild constipation.    . prochlorperazine (COMPAZINE) 10 MG tablet Take 1 tablet (10 mg total) by mouth every 6 (six) hours as needed for nausea or vomiting. 30 tablet 0  . senna (SENOKOT) 8.6 MG TABS tablet Take 1 tablet (8.6 mg total) by mouth daily.  0   No current facility-administered medications for this visit.   Facility-Administered Medications Ordered in Other Visits  Medication Dose Route Frequency Provider Last Rate Last Dose  . sodium chloride flush (NS) 0.9 % injection 10 mL  10 mL Intracatheter PRN Curt Bears, MD   10 mL  at 07/15/15 1543    SURGICAL HISTORY:  Past Surgical History  Procedure Laterality Date  . Tonsillectomy    . Video bronchoscopy N/A 07/27/2014    Procedure: VIDEO BRONCHOSCOPY;  Surgeon: [name], MD;  Location: [facility] OR;  Service: Thoracic;  Laterality: N/A;  . Video assisted thoracoscopy (vats)/empyema Left 07/27/2014    Procedure: VIDEO ASSISTED THORACOSCOPY (VATS)/EMPYEMA Left, drainage pleural effusion.;  Surgeon: [name], MD;  Location: [facility];  Service: Thoracic;   Laterality: Left;  . Video bronchoscopy Bilateral 05/20/2015    Procedure: VIDEO BRONCHOSCOPY WITH FLUORO;  Surgeon: Collene Gobble, MD;  Location: [facility];  Service: Cardiopulmonary;  Laterality: Bilateral;    REVIEW OF SYSTEMS:  Constitutional: positive for fatigue Eyes: negative Ears, nose, mouth, throat, and face: negative Respiratory: negative Cardiovascular: negative Gastrointestinal: negative Genitourinary:negative Integument/breast: negative Hematologic/lymphatic: negative Musculoskeletal:negative Neurological: negative Behavioral/Psych: negative Endocrine: negative Allergic/Immunologic: negative   PHYSICAL EXAMINATION: General appearance: alert, cooperative and no distress Head: Normocephalic, without obvious abnormality, atraumatic Neck: no adenopathy, no JVD, supple, symmetrical, trachea midline and thyroid not enlarged, symmetric, no tenderness/mass/nodules Lymph nodes: Cervical, supraclavicular, and axillary nodes normal. Resp: diminished breath sounds RLL Back: symmetric, no curvature. ROM normal. No CVA tenderness. Cardio: regular rate and rhythm, S1, S2 normal, no murmur, click, rub or gallop GI: soft, non-tender; bowel sounds normal; no masses,  no organomegaly Extremities: extremities normal, atraumatic, no cyanosis or edema Neurologic: Alert and oriented X 3, normal strength and tone. Normal symmetric reflexes. Normal coordination and gait  ECOG PERFORMANCE STATUS: 1 - Symptomatic but completely ambulatory  Blood pressure 122/56, pulse 72, temperature 98.4 F (36.9 C), temperature source Oral, resp. rate 18, height '5\' 9"'$  (1.753 m), weight 146 lb 8 oz (66.452 kg), SpO2 97 %.  LABORATORY DATA: Lab Results  Component Value Date   WBC 12.7* 07/15/2015   HGB 8.9* 07/15/2015   HCT 27.3* 07/15/2015   MCV 92.9 07/15/2015   PLT 452* 07/15/2015      Chemistry      Component Value Date/Time   NA 136 07/15/2015 1029   NA 137 06/03/2015 0332   K 4.5  07/15/2015 1029   K 4.3 06/03/2015 0332   CL 108 06/03/2015 0332   CO2 24 07/15/2015 1029   CO2 18* 06/03/2015 0332   BUN 8.5 07/15/2015 1029   BUN 89* 06/03/2015 0332   CREATININE 1.0 07/15/2015 1029   CREATININE 1.29* 06/03/2015 0332      Component Value Date/Time   CALCIUM 8.8 07/15/2015 1029   CALCIUM 8.0* 06/03/2015 0332   ALKPHOS 228* 07/15/2015 1029   ALKPHOS 757* 06/02/2015 0334   AST 26 07/15/2015 1029   AST 585* 06/02/2015 0334   ALT 15 07/15/2015 1029   ALT 121* 06/02/2015 0334   BILITOT 0.51 07/15/2015 1029   BILITOT 4.5* 06/02/2015 0334       RADIOGRAPHIC STUDIES: Ct Chest W Contrast  07/11/2015  CLINICAL DATA:  65 year old male with history of lung cancer diagnosed in April 2017, currently undergoing chemotherapy. Restaging examination. EXAM: CT CHEST, ABDOMEN, AND PELVIS WITH CONTRAST TECHNIQUE: Multidetector CT imaging of the chest, abdomen and pelvis was performed following the standard protocol during bolus administration of intravenous contrast. CONTRAST:  144m ISOVUE-300 IOPAMIDOL (ISOVUE-300) INJECTION 61% COMPARISON:  CT of the chest, abdomen and pelvis 05/18/2015. FINDINGS: CT CHEST FINDINGS Mediastinum/Lymph Nodes: Heart size is normal. There is no significant pericardial fluid, thickening or pericardial calcification. There is atherosclerosis of the thoracic aorta, the great vessels of the  mediastinum and the coronary arteries, including calcified atherosclerotic plaque in the left anterior descending and right coronary arteries. The previously noted mediastinal and right hilar lymphadenopathy has significantly decreased in size. Largest mediastinal lymph node is in the subcarinal nodal station measuring 2.2 cm in short axis (previously 3.4 cm). High right paratracheal lymph node measuring 1.4 cm also decreased compared to the prior examination (previously 2.9 cm). Esophagus is unremarkable in appearance. No axillary lymphadenopathy. Right internal jugular  single-lumen porta cath with tip terminating in the distal superior vena cava. Lungs/Pleura: Previously noted mass in the perihilar aspect of the right middle lobe appears decreased in size compared to the prior study, currently measuring 3.6 x 4.3 cm. This continues to encase the right middle lobe bronchi and is associated with near complete atelectasis and postobstructive changes in the right middle lobe. There also appears to be direct extension across the major fissure, with involvement of the central aspect of the right lower lobe, as evidenced by profound thickening of the peribronchovascular interstitium throughout the right lower lobe. Areas of septal thickening and nodularity are noted within right middle and lower lobes, suggesting some lymphangitic spread of disease. No suspicious nodules are noted in the left lung. Moderate to large right-sided pleural effusion appears slightly increased compared to the prior study, and there is pleural thickening and nodularity, compatible with a malignant pleural effusion. Mild diffuse bronchial wall thickening with mild centrilobular and paraseptal emphysema. Musculoskeletal/Soft Tissues: There are no aggressive appearing lytic or blastic lesions noted in the visualized portions of the skeleton. CT ABDOMEN AND PELVIS FINDINGS Hepatobiliary: There are innumerable hypovascular areas scattered throughout the liver, concerning for widespread metastatic disease, largest of which measures 1.4 cm in segment 4A (image 53 of series 2). No intra or extrahepatic biliary ductal dilatation. Gallbladder is normal in appearance. Pancreas: No pancreatic mass. No pancreatic ductal dilatation. No pancreatic or peripancreatic fluid or inflammatory changes. Spleen: Unremarkable. Adrenals/Urinary Tract: 1.4 cm low-attenuation lesion in the interpolar region of the left kidney is compatible with a simple cyst. Right kidney and bilateral adrenal glands are normal in appearance. No  hydroureteronephrosis. Urinary bladder is normal in appearance. Stomach/Bowel: Stomach is normal in appearance. No pathologic dilatation of small bowel or colon. Several colonic diverticulae are noted, without surrounding inflammatory changes to suggest an acute diverticulitis at this time. Normal appendix. Vascular/Lymphatic: Atherosclerosis throughout the abdominal and pelvic vasculature, and including what appears to be in ulcerated plaque in the distal left common iliac artery which measures up to 1.3 cm in diameter. Mildly enlarged upper right retrocaval lymph node measuring 11 mm in short axis. No other lymphadenopathy noted elsewhere in the abdomen or pelvis. Reproductive: Prostate gland and seminal vesicles are unremarkable in appearance. Other: No significant volume of ascites.  No pneumoperitoneum. Musculoskeletal: There are no aggressive appearing lytic or blastic lesions noted in the visualized portions of the skeleton. IMPRESSION: 1. Today's study demonstrates a positive response to therapy as demonstrated by decreased size of the primary right middle lobe mass, and decreasing right hilar and mediastinal lymphadenopathy. There is evidence of probable lymphangitic spread of disease in the right lung, which also appears slightly decreased compared to the prior study. 2. 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 3. Numerous hypovascular hepatic lesions, highly concerning for widespread metastatic disease to the liver. 4. Colonic diverticulosis without evidence of acute diverticulitis at this time. 5. Extensive atherosclerosis, including 2 vessel coronary artery disease. 6. Additional incidental findings,  as above. Electronically Signed   By: Vinnie Langton M.D.   On: 07/11/2015 15:31   Ct Abdomen Pelvis W Contrast  07/11/2015  CLINICAL DATA:  65 year old male with history of lung cancer diagnosed in April 2017, currently undergoing  chemotherapy. Restaging examination. EXAM: CT CHEST, ABDOMEN, AND PELVIS WITH CONTRAST TECHNIQUE: Multidetector CT imaging of the chest, abdomen and pelvis was performed following the standard protocol during bolus administration of intravenous contrast. CONTRAST:  180m ISOVUE-300 IOPAMIDOL (ISOVUE-300) INJECTION 61% COMPARISON:  CT of the chest, abdomen and pelvis 05/18/2015. FINDINGS: CT CHEST FINDINGS Mediastinum/Lymph Nodes: Heart size is normal. There is no significant pericardial fluid, thickening or pericardial calcification. There is atherosclerosis of the thoracic aorta, the great vessels of the mediastinum and the coronary arteries, including calcified atherosclerotic plaque in the left anterior descending and right coronary arteries. The previously noted mediastinal and right hilar lymphadenopathy has significantly decreased in size. Largest mediastinal lymph node is in the subcarinal nodal station measuring 2.2 cm in short axis (previously 3.4 cm). High right paratracheal lymph node measuring 1.4 cm also decreased compared to the prior examination (previously 2.9 cm). Esophagus is unremarkable in appearance. No axillary lymphadenopathy. Right internal jugular single-lumen porta cath with tip terminating in the distal superior vena cava. Lungs/Pleura: Previously noted mass in the perihilar aspect of the right middle lobe appears decreased in size compared to the prior study, currently measuring 3.6 x 4.3 cm. This continues to encase the right middle lobe bronchi and is associated with near complete atelectasis and postobstructive changes in the right middle lobe. There also appears to be direct extension across the major fissure, with involvement of the central aspect of the right lower lobe, as evidenced by profound thickening of the peribronchovascular interstitium throughout the right lower lobe. Areas of septal thickening and nodularity are noted within right middle and lower lobes, suggesting some  lymphangitic spread of disease. No suspicious nodules are noted in the left lung. Moderate to large right-sided pleural effusion appears slightly increased compared to the prior study, and there is pleural thickening and nodularity, compatible with a malignant pleural effusion. Mild diffuse bronchial wall thickening with mild centrilobular and paraseptal emphysema. Musculoskeletal/Soft Tissues: There are no aggressive appearing lytic or blastic lesions noted in the visualized portions of the skeleton. CT ABDOMEN AND PELVIS FINDINGS Hepatobiliary: There are innumerable hypovascular areas scattered throughout the liver, concerning for widespread metastatic disease, largest of which measures 1.4 cm in segment 4A (image 53 of series 2). No intra or extrahepatic biliary ductal dilatation. Gallbladder is normal in appearance. Pancreas: No pancreatic mass. No pancreatic ductal dilatation. No pancreatic or peripancreatic fluid or inflammatory changes. Spleen: Unremarkable. Adrenals/Urinary Tract: 1.4 cm low-attenuation lesion in the interpolar region of the left kidney is compatible with a simple cyst. Right kidney and bilateral adrenal glands are normal in appearance. No hydroureteronephrosis. Urinary bladder is normal in appearance. Stomach/Bowel: Stomach is normal in appearance. No pathologic dilatation of small bowel or colon. Several colonic diverticulae are noted, without surrounding inflammatory changes to suggest an acute diverticulitis at this time. Normal appendix. Vascular/Lymphatic: Atherosclerosis throughout the abdominal and pelvic vasculature, and including what appears to be in ulcerated plaque in the distal left common iliac artery which measures up to 1.3 cm in diameter. Mildly enlarged upper right retrocaval lymph node measuring 11 mm in short axis. No other lymphadenopathy noted elsewhere in the abdomen or pelvis. Reproductive: Prostate gland and seminal vesicles are unremarkable in appearance. Other: No  significant volume of ascites.  No pneumoperitoneum. Musculoskeletal: There are no aggressive appearing lytic or blastic lesions noted in the visualized portions of the skeleton. IMPRESSION: 1. Today's study demonstrates a positive response to therapy as demonstrated by decreased size of the primary right middle lobe mass, and decreasing right hilar and mediastinal lymphadenopathy. There is evidence of probable lymphangitic spread of disease in the right lung, which also appears slightly decreased compared to the prior study. 2. 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 3. Numerous hypovascular hepatic lesions, highly concerning for widespread metastatic disease to the liver. 4. Colonic diverticulosis without evidence of acute diverticulitis at this time. 5. Extensive atherosclerosis, including 2 vessel coronary artery disease. 6. Additional incidental findings, as above. Electronically Signed   By: Vinnie Langton M.D.   On: 07/11/2015 15:31   Ir Fluoro Guide Cv Line Right  06/19/2015  INDICATION: 65 year old male with advanced small cell lung cancer and poor venous access in need of durable venous access for chemotherapy. EXAM: IMPLANTED PORT A CATH PLACEMENT WITH ULTRASOUND AND FLUOROSCOPIC GUIDANCE MEDICATIONS: 2 g Ancef; The antibiotic was administered within an appropriate time interval prior to skin puncture. ANESTHESIA/SEDATION: Versed Three mg IV; Fentanyl 50 mcg IV; Moderate Sedation Time:  17 The patient was continuously monitored during the procedure by the interventional radiology nurse under my direct supervision. FLUOROSCOPY TIME:  One minutes, 12 seconds (12 mGy) COMPLICATIONS: None immediate. Estimated blood loss: None PROCEDURE: The right neck and chest was prepped with chlorhexidine, and draped in the usual sterile fashion using maximum barrier technique (cap and mask, sterile gown, sterile gloves, large sterile sheet, hand  hygiene and cutaneous antiseptic). Antibiotic prophylaxis was provided with 2g Ancef administered IV one hour prior to skin incision. Local anesthesia was attained by infiltration with 1% lidocaine with epinephrine. Ultrasound demonstrated patency of the right internal jugular vein, and this was documented with an image. Under real-time ultrasound guidance, this vein was accessed with a 21 gauge micropuncture needle and image documentation was performed. A small dermatotomy was made at the access site with an 11 scalpel. A 0.018" wire was advanced into the SVC and the access needle exchanged for a 43F micropuncture vascular sheath. The 0.018" wire was then removed and a 0.035" wire advanced into the IVC. An appropriate location for the subcutaneous reservoir was selected below the clavicle and an incision was made through the skin and underlying soft tissues. The subcutaneous tissues were then dissected using a combination of blunt and sharp surgical technique and a pocket was formed. A single lumen power injectable portacatheter was then tunneled through the subcutaneous tissues from the pocket to the dermatotomy and the port reservoir placed within the subcutaneous pocket. The venous access site was then serially dilated and a peel away vascular sheath placed over the wire. The wire was removed and the port catheter advanced into position under fluoroscopic guidance. The catheter tip is positioned in the upper right atrium. This was documented with a spot image. The portacatheter was then tested and found to flush and aspirate well. The port was flushed with saline followed by 100 units/mL heparinized saline. The pocket was then closed in two layers using first subdermal inverted interrupted absorbable sutures followed by a running subcuticular suture. The epidermis was then sealed with Dermabond. The dermatotomy at the venous access site was also closed with a single inverted subdermal suture and the epidermis  sealed with Dermabond. IMPRESSION: Successful placement of a right IJ approach Power Port with  ultrasound and fluoroscopic guidance. The catheter is ready for use. Signed, Criselda Peaches, MD [facility] Electronically Signed   By: Jacqulynn Cadet M.D.   On: 06/19/2015 15:24   Ir US Guide Vasc Access Right  06/19/2015  INDICATION: 65 year old male with advanced small cell lung cancer and poor venous access in need of durable venous access for chemotherapy. EXAM: IMPLANTED PORT A CATH PLACEMENT WITH ULTRASOUND AND FLUOROSCOPIC GUIDANCE MEDICATIONS: 2 g Ancef; The antibiotic was administered within an appropriate time interval prior to skin puncture. ANESTHESIA/SEDATION: Versed Three mg IV; Fentanyl 50 mcg IV; Moderate Sedation Time:  17 The patient was continuously monitored during the procedure by the interventional radiology nurse under my direct supervision. FLUOROSCOPY TIME:  One minutes, 12 seconds (12 mGy) COMPLICATIONS: None immediate. Estimated blood loss: None PROCEDURE: The right neck and chest was prepped with chlorhexidine, and draped in the usual sterile fashion using maximum barrier technique (cap and mask, sterile gown, sterile gloves, large sterile sheet, hand hygiene and cutaneous antiseptic). Antibiotic prophylaxis was provided with 2g Ancef administered IV one hour prior to skin incision. Local anesthesia was attained by infiltration with 1% lidocaine with epinephrine. Ultrasound demonstrated patency of the right internal jugular vein, and this was documented with an image. Under real-time ultrasound guidance, this vein was accessed with a 21 gauge micropuncture needle and image documentation was performed. A small dermatotomy was made at the access site with an 11 scalpel. A 0.018" wire was advanced into the SVC and the access needle exchanged for a 63F micropuncture vascular sheath. The 0.018" wire was then removed and a 0.035"  wire advanced into the IVC. An appropriate location for the subcutaneous reservoir was selected below the clavicle and an incision was made through the skin and underlying soft tissues. The subcutaneous tissues were then dissected using a combination of blunt and sharp surgical technique and a pocket was formed. A single lumen power injectable portacatheter was then tunneled through the subcutaneous tissues from the pocket to the dermatotomy and the port reservoir placed within the subcutaneous pocket. The venous access site was then serially dilated and a peel away vascular sheath placed over the wire. The wire was removed and the port catheter advanced into position under fluoroscopic guidance. The catheter tip is positioned in the upper right atrium. This was documented with a spot image. The portacatheter was then tested and found to flush and aspirate well. The port was flushed with saline followed by 100 units/mL heparinized saline. The pocket was then closed in two layers using first subdermal inverted interrupted absorbable sutures followed by a running subcuticular suture. The epidermis was then sealed with Dermabond. The dermatotomy at the venous access site was also closed with a single inverted subdermal suture and the epidermis sealed with Dermabond. IMPRESSION: Successful placement of a right IJ approach Power Port with ultrasound and fluoroscopic guidance. The catheter is ready for use. Signed, Criselda Peaches, MD [facility] Radiology Electronically Signed   By: Jacqulynn Cadet M.D.   On: 06/19/2015 15:24    ASSESSMENT AND PLAN: This is a very pleasant 65 year old white male recently diagnosed with extensive stage small cell lung cancer status post first cycle of chemotherapy with reduced dose carboplatin and etoposide. The patient has tolerated his first 2 cycles of chemotherapy well and even feels better with receiving the  chemotherapy.   Patient was seen and discussed with Dr. Julien Nordmann. Scan results with images  were reviewed with the patient and his family today. He has had a significant response to the chemotherapy. Symptomatically he has also improved. Recommend that he proceed with cycle 3 of his chemotherapy today. Liver enzymes have normalized today. We will increase his carboplatin dose to AUC of 5 and increase the etoposide dose to 100 mg/m with this cycle. Plan is to proceed with additional 2-4 cycles of chemotherapy. He will have a restaging CT scan performed after his fourth cycle of chemotherapy.  The patient is anemic with a hemoglobin of 8.9 today. He is asymptomatic. She will have weekly labs and we will continue to watch this and transfuse if his hemoglobin drops below 8.  The patient will come back for follow-up visit in 3 weeks for evaluation before starting cycle #4 of his chemotherapy.  He was advised to call immediately if he has any concerning symptoms in the interval. The patient voices understanding of current disease status and treatment options and is in agreement with the current care plan.  All questions were answered. The patient knows to call the clinic with any problems, questions or concerns. We can certainly see the patient much sooner if necessary.   Shane Bussing, DNP, AGPCNP-BC, AOCNP  ADDENDUM: Hematology/Oncology Attending: I had a face to face encounter with the patient today. I recommended his care plan. This is a very pleasant 65 years old white male recently diagnosed with extensive stage small cell lung cancer. He is currently undergoing systemic chemotherapy with reduced dose carboplatin and etoposide because of the poor liver function as well as thrombocytopenia. He is status post 2 cycles. He has significant improvement in his condition off after starting the first 2 cycles of the chemotherapy. The recent CT scan of the chest, abdomen and pelvis showed significant  improvement in his disease. I discussed the scan results with the patient and his family and showed them the images today. I recommended for the patient to continue his current treatment with carboplatin and etoposide but I will increase the dose of carboplatin to AUC of 5 and etoposide to 100 MG/M2 starting from this cycle. He would come back for follow-up visit in 3 weeks for evaluation and management of any adverse effect of his treatment. For the chemotherapy-induced anemia, will consider the patient for PRBCs transfusion if his hemoglobin is less than 8.0. The patient was advised to call immediately if he has any concerning symptoms in the interval.  Disclaimer: This note was dictated with voice recognition software. Similar sounding words can inadvertently be transcribed and may be missed upon review. Eilleen Kempf., MD 07/15/2015

## 2015-07-15 NOTE — Patient Instructions (Signed)

## 2015-07-16 ENCOUNTER — Ambulatory Visit (HOSPITAL_BASED_OUTPATIENT_CLINIC_OR_DEPARTMENT_OTHER): Payer: Medicare Other

## 2015-07-16 ENCOUNTER — Telehealth: Payer: Self-pay | Admitting: Internal Medicine

## 2015-07-16 VITALS — BP 139/76 | HR 59 | Temp 98.3°F | Resp 16

## 2015-07-16 DIAGNOSIS — C3491 Malignant neoplasm of unspecified part of right bronchus or lung: Secondary | ICD-10-CM | POA: Diagnosis not present

## 2015-07-16 DIAGNOSIS — Z5111 Encounter for antineoplastic chemotherapy: Secondary | ICD-10-CM | POA: Diagnosis present

## 2015-07-16 MED ORDER — SODIUM CHLORIDE 0.9 % IV SOLN
Freq: Once | INTRAVENOUS | Status: AC
  Administered 2015-07-16: 11:00:00 via INTRAVENOUS

## 2015-07-16 MED ORDER — DEXAMETHASONE SODIUM PHOSPHATE 100 MG/10ML IJ SOLN
10.0000 mg | Freq: Once | INTRAMUSCULAR | Status: AC
  Administered 2015-07-16: 10 mg via INTRAVENOUS
  Filled 2015-07-16: qty 1

## 2015-07-16 MED ORDER — HEPARIN SOD (PORK) LOCK FLUSH 100 UNIT/ML IV SOLN
500.0000 [IU] | Freq: Once | INTRAVENOUS | Status: AC | PRN
  Administered 2015-07-16: 500 [IU]
  Filled 2015-07-16: qty 5

## 2015-07-16 MED ORDER — SODIUM CHLORIDE 0.9% FLUSH
10.0000 mL | INTRAVENOUS | Status: DC | PRN
  Administered 2015-07-16: 10 mL
  Filled 2015-07-16: qty 10

## 2015-07-16 MED ORDER — SODIUM CHLORIDE 0.9 % IV SOLN
100.0000 mg/m2 | Freq: Once | INTRAVENOUS | Status: AC
  Administered 2015-07-16: 190 mg via INTRAVENOUS
  Filled 2015-07-16: qty 9.5

## 2015-07-16 NOTE — Telephone Encounter (Signed)
Hilliard Clark called back, he said pt is having Chemo for 3 days and is not feeling up to PT maybe ready on Friday. Told Hilliard Clark that is fine whenever pt is feeling up to it. Sean verbalized understanding.

## 2015-07-16 NOTE — Telephone Encounter (Signed)
Left message on voicemail to call office.  

## 2015-07-16 NOTE — Telephone Encounter (Signed)
Shane Vasquez from [facility] call to say that pt does not want to do Physical Therapy until Friday 07/18/15. Shane Vasquez is asking if this is ok.   [phone]

## 2015-07-17 ENCOUNTER — Ambulatory Visit (HOSPITAL_BASED_OUTPATIENT_CLINIC_OR_DEPARTMENT_OTHER): Payer: Medicare Other

## 2015-07-17 VITALS — BP 138/65 | HR 63 | Temp 98.9°F

## 2015-07-17 DIAGNOSIS — Z5111 Encounter for antineoplastic chemotherapy: Secondary | ICD-10-CM

## 2015-07-17 DIAGNOSIS — C3491 Malignant neoplasm of unspecified part of right bronchus or lung: Secondary | ICD-10-CM

## 2015-07-17 MED ORDER — SODIUM CHLORIDE 0.9 % IV SOLN
100.0000 mg/m2 | Freq: Once | INTRAVENOUS | Status: AC
  Administered 2015-07-17: 190 mg via INTRAVENOUS
  Filled 2015-07-17: qty 9.5

## 2015-07-17 MED ORDER — SODIUM CHLORIDE 0.9% FLUSH
10.0000 mL | INTRAVENOUS | Status: DC | PRN
  Administered 2015-07-17: 10 mL
  Filled 2015-07-17: qty 10

## 2015-07-17 MED ORDER — SODIUM CHLORIDE 0.9 % IV SOLN
Freq: Once | INTRAVENOUS | Status: AC
  Administered 2015-07-17: 11:00:00 via INTRAVENOUS

## 2015-07-17 MED ORDER — SODIUM CHLORIDE 0.9 % IV SOLN
10.0000 mg | Freq: Once | INTRAVENOUS | Status: AC
  Administered 2015-07-17: 10 mg via INTRAVENOUS
  Filled 2015-07-17: qty 1

## 2015-07-17 MED ORDER — HEPARIN SOD (PORK) LOCK FLUSH 100 UNIT/ML IV SOLN
500.0000 [IU] | Freq: Once | INTRAVENOUS | Status: AC | PRN
  Administered 2015-07-17: 500 [IU]
  Filled 2015-07-17: qty 5

## 2015-07-17 NOTE — Patient Instructions (Signed)
[facility] Discharge Instructions for Patients Receiving Chemotherapy  Today you received the following chemotherapy agents Etoposide.   To help prevent nausea and vomiting after your treatment, we encourage you to take your nausea medication as prescribed.   If you develop nausea and vomiting that is not controlled by your nausea medication, call the clinic.   BELOW ARE SYMPTOMS THAT SHOULD BE REPORTED IMMEDIATELY:  *FEVER GREATER THAN 100.5 F  *CHILLS WITH OR WITHOUT FEVER  NAUSEA AND VOMITING THAT IS NOT CONTROLLED WITH YOUR NAUSEA MEDICATION  *UNUSUAL SHORTNESS OF BREATH  *UNUSUAL BRUISING OR BLEEDING  TENDERNESS IN MOUTH AND THROAT WITH OR WITHOUT PRESENCE OF ULCERS  *URINARY PROBLEMS  *BOWEL PROBLEMS  UNUSUAL RASH Items with * indicate a potential emergency and should be followed up as soon as possible.  Feel free to call the clinic you have any questions or concerns. The clinic phone number is [phone].  Please show the CHEMO ALERT CARD at check-in to the Emergency Department and triage nurse.   

## 2015-07-17 NOTE — Progress Notes (Signed)
Spoke with Charlena Cross, verified that prior authorization had been obtained for his Etoposide.

## 2015-07-18 ENCOUNTER — Ambulatory Visit (HOSPITAL_BASED_OUTPATIENT_CLINIC_OR_DEPARTMENT_OTHER): Payer: Medicare Other

## 2015-07-18 VITALS — BP 123/62 | HR 70 | Temp 98.2°F | Resp 20

## 2015-07-18 DIAGNOSIS — Z5189 Encounter for other specified aftercare: Secondary | ICD-10-CM | POA: Diagnosis not present

## 2015-07-18 DIAGNOSIS — C3491 Malignant neoplasm of unspecified part of right bronchus or lung: Secondary | ICD-10-CM | POA: Diagnosis present

## 2015-07-18 MED ORDER — PEGFILGRASTIM INJECTION 6 MG/0.6ML ~~LOC~~
6.0000 mg | PREFILLED_SYRINGE | Freq: Once | SUBCUTANEOUS | Status: AC
  Administered 2015-07-18: 6 mg via SUBCUTANEOUS
  Filled 2015-07-18: qty 0.6

## 2015-07-18 NOTE — Patient Instructions (Signed)
Pegfilgrastim injection What is this medicine? PEGFILGRASTIM (PEG fil gra stim) is a long-acting granulocyte colony-stimulating factor that stimulates the growth of neutrophils, a type of white blood cell important in the body's fight against infection. It is used to reduce the incidence of fever and infection in patients with certain types of cancer who are receiving chemotherapy that affects the bone marrow, and to increase survival after being exposed to high doses of radiation. This medicine may be used for other purposes; ask your health care provider or pharmacist if you have questions. What should I tell my health care provider before I take this medicine? They need to know if you have any of these conditions: -kidney disease -latex allergy -ongoing radiation therapy -sickle cell disease -skin reactions to acrylic adhesives (On-Body Injector only) -an unusual or allergic reaction to pegfilgrastim, filgrastim, other medicines, foods, dyes, or preservatives -pregnant or trying to get pregnant -breast-feeding How should I use this medicine? This medicine is for injection under the skin. If you get this medicine at home, you will be taught how to prepare and give the pre-filled syringe or how to use the On-body Injector. Refer to the patient Instructions for Use for detailed instructions. Use exactly as directed. Take your medicine at regular intervals. Do not take your medicine more often than directed. It is important that you put your used needles and syringes in a special sharps container. Do not put them in a trash can. If you do not have a sharps container, call your pharmacist or healthcare provider to get one. Talk to your pediatrician regarding the use of this medicine in children. While this drug may be prescribed for selected conditions, precautions do apply. Overdosage: If you think you have taken too much of this medicine contact a poison control center or emergency room at  once. NOTE: This medicine is only for you. Do not share this medicine with others. What if I miss a dose? It is important not to miss your dose. Call your doctor or health care professional if you miss your dose. If you miss a dose due to an On-body Injector failure or leakage, a new dose should be administered as soon as possible using a single prefilled syringe for manual use. What may interact with this medicine? Interactions have not been studied. Give your health care provider a list of all the medicines, herbs, non-prescription drugs, or dietary supplements you use. Also tell them if you smoke, drink alcohol, or use illegal drugs. Some items may interact with your medicine. This list may not describe all possible interactions. Give your health care provider a list of all the medicines, herbs, non-prescription drugs, or dietary supplements you use. Also tell them if you smoke, drink alcohol, or use illegal drugs. Some items may interact with your medicine. What should I watch for while using this medicine? You may need blood work done while you are taking this medicine. If you are going to need a MRI, CT scan, or other procedure, tell your doctor that you are using this medicine (On-Body Injector only). What side effects may I notice from receiving this medicine? Side effects that you should report to your doctor or health care professional as soon as possible: -allergic reactions like skin rash, itching or hives, swelling of the face, lips, or tongue -dizziness -fever -pain, redness, or irritation at site where injected -pinpoint red spots on the skin -red or dark-brown urine -shortness of breath or breathing problems -stomach or side pain, or pain   at the shoulder -swelling -tiredness -trouble passing urine or change in the amount of urine Side effects that usually do not require medical attention (report to your doctor or health care professional if they continue or are  bothersome): -bone pain -muscle pain This list may not describe all possible side effects. Call your doctor for medical advice about side effects. You may report side effects to FDA at 1-800-FDA-1088. Where should I keep my medicine? Keep out of the reach of children. Store pre-filled syringes in a refrigerator between 2 and 8 degrees C (36 and 46 degrees F). Do not freeze. Keep in carton to protect from light. Throw away this medicine if it is left out of the refrigerator for more than 48 hours. Throw away any unused medicine after the expiration date. NOTE: This sheet is a summary. It may not cover all possible information. If you have questions about this medicine, talk to your doctor, pharmacist, or health care provider.    2016, Elsevier/Gold Standard. (2014-02-21 14:30:14)  

## 2015-07-21 ENCOUNTER — Ambulatory Visit (HOSPITAL_BASED_OUTPATIENT_CLINIC_OR_DEPARTMENT_OTHER): Payer: Medicare Other

## 2015-07-21 ENCOUNTER — Other Ambulatory Visit (HOSPITAL_BASED_OUTPATIENT_CLINIC_OR_DEPARTMENT_OTHER): Payer: Medicare Other

## 2015-07-21 ENCOUNTER — Ambulatory Visit: Payer: Medicare Other

## 2015-07-21 DIAGNOSIS — C3491 Malignant neoplasm of unspecified part of right bronchus or lung: Secondary | ICD-10-CM

## 2015-07-21 DIAGNOSIS — Z95828 Presence of other vascular implants and grafts: Secondary | ICD-10-CM

## 2015-07-21 LAB — CBC WITH DIFFERENTIAL/PLATELET
BASO%: 0.2 % (ref 0.0–2.0)
Basophils Absolute: 0 10*3/uL (ref 0.0–0.1)
EOS ABS: 0 10*3/uL (ref 0.0–0.5)
EOS%: 0.1 % (ref 0.0–7.0)
HEMATOCRIT: 26 % — AB (ref 38.4–49.9)
HGB: 8.5 g/dL — ABNORMAL LOW (ref 13.0–17.1)
LYMPH#: 1.1 10*3/uL (ref 0.9–3.3)
LYMPH%: 5.8 % — AB (ref 14.0–49.0)
MCH: 30.3 pg (ref 27.2–33.4)
MCHC: 32.6 g/dL (ref 32.0–36.0)
MCV: 92.9 fL (ref 79.3–98.0)
MONO#: 0.1 10*3/uL (ref 0.1–0.9)
MONO%: 0.7 % (ref 0.0–14.0)
NEUT%: 93.2 % — AB (ref 39.0–75.0)
NEUTROS ABS: 18.4 10*3/uL — AB (ref 1.5–6.5)
PLATELETS: 326 10*3/uL (ref 140–400)
RBC: 2.8 10*6/uL — ABNORMAL LOW (ref 4.20–5.82)
RDW: 17.5 % — ABNORMAL HIGH (ref 11.0–14.6)
WBC: 19.7 10*3/uL — ABNORMAL HIGH (ref 4.0–10.3)

## 2015-07-21 LAB — COMPREHENSIVE METABOLIC PANEL
ALK PHOS: 206 U/L — AB (ref 40–150)
ALT: 21 U/L (ref 0–55)
ANION GAP: 8 meq/L (ref 3–11)
AST: 27 U/L (ref 5–34)
Albumin: 2.9 g/dL — ABNORMAL LOW (ref 3.5–5.0)
BILIRUBIN TOTAL: 0.62 mg/dL (ref 0.20–1.20)
BUN: 18.9 mg/dL (ref 7.0–26.0)
CALCIUM: 8.7 mg/dL (ref 8.4–10.4)
CO2: 25 mEq/L (ref 22–29)
CREATININE: 0.9 mg/dL (ref 0.7–1.3)
Chloride: 102 mEq/L (ref 98–109)
EGFR: 89 mL/min/{1.73_m2} — ABNORMAL LOW (ref >90)
Glucose: 133 mg/dl (ref 70–140)
Potassium: 4.1 mEq/L (ref 3.5–5.1)
Sodium: 135 mEq/L — ABNORMAL LOW (ref 136–145)
TOTAL PROTEIN: 6.7 g/dL (ref 6.4–8.3)

## 2015-07-21 MED ORDER — HEPARIN SOD (PORK) LOCK FLUSH 100 UNIT/ML IV SOLN
500.0000 [IU] | Freq: Once | INTRAVENOUS | Status: AC
  Administered 2015-07-21: 500 [IU] via INTRAVENOUS
  Filled 2015-07-21: qty 5

## 2015-07-21 MED ORDER — SODIUM CHLORIDE 0.9% FLUSH
10.0000 mL | INTRAVENOUS | Status: DC | PRN
  Administered 2015-07-21: 10 mL via INTRAVENOUS
  Filled 2015-07-21: qty 10

## 2015-07-22 ENCOUNTER — Ambulatory Visit (INDEPENDENT_AMBULATORY_CARE_PROVIDER_SITE_OTHER): Payer: Medicare Other | Admitting: Internal Medicine

## 2015-07-22 ENCOUNTER — Encounter: Payer: Self-pay | Admitting: Internal Medicine

## 2015-07-22 VITALS — BP 110/62 | HR 70 | Temp 98.1°F | Resp 18 | Ht 69.0 in | Wt 147.0 lb

## 2015-07-22 DIAGNOSIS — Z0000 Encounter for general adult medical examination without abnormal findings: Secondary | ICD-10-CM | POA: Diagnosis not present

## 2015-07-22 DIAGNOSIS — C3490 Malignant neoplasm of unspecified part of unspecified bronchus or lung: Secondary | ICD-10-CM

## 2015-07-22 DIAGNOSIS — I10 Essential (primary) hypertension: Secondary | ICD-10-CM

## 2015-07-22 DIAGNOSIS — E785 Hyperlipidemia, unspecified: Secondary | ICD-10-CM | POA: Diagnosis not present

## 2015-07-22 DIAGNOSIS — C799 Secondary malignant neoplasm of unspecified site: Secondary | ICD-10-CM | POA: Diagnosis not present

## 2015-07-22 NOTE — Progress Notes (Signed)
Pre visit review using our clinic review tool, if applicable. No additional management support is needed unless otherwise documented below in the visit note. 

## 2015-07-22 NOTE — Progress Notes (Signed)
Subjective:    Patient ID: Shane Vasquez, male    DOB: 01-02-1951, 65 y.o.   MRN: [id number]  HPI  65 year old patient who is seen today for a wellness exam. He is followed closely by oncology with a recent diagnosis of widely metastatic small cell lung cancer.  He has responded quite well to chemotherapy, which he continues to tolerate well. He has a prior history of hypertension, but presently off therapy He has a history of dyslipidemia and has not tolerated statin therapy well.  In the past he was on a twice-weekly regimen of Crestor He continues to improve.  He was in a rehabilitation facility for 2 weeks.  He is increasing his exercise regimen and his appetite remains good  Past Medical History  Diagnosis Date  . CERVICAL RADICULOPATHY, RIGHT 08/12/2009  . DIVERTICULOSIS, COLON 08/15/2006  . GERD 08/15/2006  . HYPERLIPIDEMIA 08/15/2006  . HYPERTENSION 08/15/2006  . INTERNAL HEMORRHOIDS 02/29/2008  . SKIN CANCER, HX OF 08/15/2006  . CAP (community acquired pneumonia)     LLL 07/2014  . Empyema lung ([facility])     2016 on left     Social History   Social History  . Marital Status: Married    Spouse Name: N/A  . Number of Children: N/A  . Years of Education: N/A   Occupational History  . Not on file.   Social History Main Topics  . Smoking status: Former Smoker -- 1.00 packs/day    Types: Cigarettes    Quit date: 08/29/2012  . Smokeless tobacco: Never Used  . Alcohol Use: No  . Drug Use: No  . Sexual Activity: No   Other Topics Concern  . Not on file   Social History Narrative    Past Surgical History  Procedure Laterality Date  . Tonsillectomy    . Video bronchoscopy N/A 07/27/2014    Procedure: VIDEO BRONCHOSCOPY;  Surgeon: [name], MD;  Location: [facility];  Service: Thoracic;  Laterality: N/A;  . Video assisted thoracoscopy (vats)/empyema Left 07/27/2014    Procedure: VIDEO ASSISTED THORACOSCOPY (VATS)/EMPYEMA Left, drainage pleural effusion.;  Surgeon:  [name], MD;  Location: [facility];  Service: Thoracic;  Laterality: Left;  . Video bronchoscopy Bilateral 05/20/2015    Procedure: VIDEO BRONCHOSCOPY WITH FLUORO;  Surgeon: Collene Gobble, MD;  Location: [facility];  Service: Cardiopulmonary;  Laterality: Bilateral;    Family History  Problem Relation Age of Onset  . Osteoporosis Mother   . COPD Father     Allergies  Allergen Reactions  . Bee Venom Anaphylaxis and Nausea And Vomiting    Current Outpatient Prescriptions on File Prior to Visit  Medication Sig Dispense Refill  . citalopram (CELEXA) 40 MG tablet Take 0.5 tablets (20 mg total) by mouth daily.    Marland Kitchen EPINEPHrine 0.3 mg/0.3 mL IJ SOAJ injection Inject 0.3 mLs (0.3 mg total) into the muscle once. 1 Device 2  . lidocaine-prilocaine (EMLA) cream Apply to port -a-cath 1-2 hours prior to access. 30 g 2  . oxyCODONE (ROXICODONE) 5 MG immediate release tablet TAKE 1-2 TABLETS BY MOUTH EVERY 4-6 HOURS AS NEEDED FOR SEVERE PAIN 20 tablet 0  . polyethylene glycol (MIRALAX / GLYCOLAX) packet Take 17 g by mouth daily as needed for mild constipation.    . prochlorperazine (COMPAZINE) 10 MG tablet Take 1 tablet (10 mg total) by mouth every 6 (six) hours as needed for nausea or vomiting. 30 tablet 0  . senna (SENOKOT) 8.6 MG TABS tablet  Take 1 tablet (8.6 mg total) by mouth daily.  0   No current facility-administered medications on file prior to visit.    BP 110/62 mmHg  Pulse 70  Temp(Src) 98.1 F (36.7 C) (Oral)  Resp 18  Ht '5\' 9"'$  (1.753 m)  Wt 147 lb (66.679 kg)  BMI 21.70 kg/m2  SpO2 96%   1. Risk factors, based on past  M,S,F history.  Current vascular risk factors include hypertension and dyslipidemia.  Chest CT has revealed coronary artery calcification  2.  Physical activities: Patient has improving exercise tolerance  3.  Depression/mood: No history of major depression or mood disorder   4.  Hearing: No deficits  5.  ADL's: Independent in all aspects of daily  living  6.  Fall risk: Low  7.  Home safety: No problems identified  8.  Height weight, and visual acuity; height and weight stable.  Patient does have cataracts and is planning to follow-up with ophthalmology  9.  Counseling: Continue heart healthy diet and exercise regimen  10. Lab orders based on risk factors: Recent lab studies reviewed  11. Referral : Follow-up oncology and ophthalmology  12. Care plan: Continue heart healthy diet and close oncological follow-up  13. Cognitive assessment: Alert and oriented with normal affect no cognitive dysfunction  14. Screening: Patient provided with a written and personalized 5-10 year screening schedule in the AVS.  .  We'll continue annual clinical exams and consider follow-up colonoscopy depending on his clinical response to treatment of [facility] oncology.    15. Provider List Update: Primary care ophthalmology and oncology    Review of Systems  Constitutional: Positive for fatigue. Negative for fever, chills and appetite change.  HENT: Negative for congestion, dental problem, ear pain, hearing loss, sore throat, tinnitus, trouble swallowing and voice change.   Eyes: Negative for pain, discharge and visual disturbance.  Respiratory: Negative for cough, chest tightness, wheezing and stridor.   Cardiovascular: Negative for chest pain, palpitations and leg swelling.  Gastrointestinal: Negative for nausea, vomiting, abdominal pain, diarrhea, constipation, blood in stool and abdominal distention.  Genitourinary: Negative for urgency, hematuria, flank pain, discharge, difficulty urinating and genital sores.  Musculoskeletal: Negative for myalgias, back pain, joint swelling, arthralgias, gait problem and neck stiffness.  Skin: Negative for rash.  Neurological: Positive for weakness. Negative for dizziness, syncope, speech difficulty, numbness and headaches.  Hematological: Negative for adenopathy. Does not bruise/bleed easily.    Psychiatric/Behavioral: Negative for behavioral problems and dysphoric mood. The patient is not nervous/anxious.        Objective:   Physical Exam  Constitutional: He appears well-developed and well-nourished.  HENT:  Head: Normocephalic and atraumatic.  Right Ear: External ear normal.  Left Ear: External ear normal.  Nose: Nose normal.  Mouth/Throat: Oropharynx is clear and moist.  Eyes: Conjunctivae and EOM are normal. Pupils are equal, round, and reactive to light. No scleral icterus.  Neck: Normal range of motion. Neck supple. No JVD present. No thyromegaly present.  Cardiovascular: Regular rhythm and normal heart sounds.  Exam reveals no gallop and no friction rub.   No murmur heard. Bilateral femoral bruits Decreased left dorsalis pedis pulse  Pulmonary/Chest: Effort normal and breath sounds normal. He exhibits no tenderness.  PAC right upper anterior chest wall  Dullness.  Diminished breath sounds right hemithorax  Abdominal: Soft. Bowel sounds are normal. He exhibits no distension and no mass. There is no tenderness.  Genitourinary: Prostate normal and penis normal.  Musculoskeletal: Normal range of motion.  He exhibits no edema or tenderness.  Lymphadenopathy:    He has no cervical adenopathy.  Neurological: He is alert. He has normal reflexes. No cranial nerve deficit. Coordination normal.  Skin: Skin is warm and dry. No rash noted.  Psychiatric: He has a normal mood and affect. His behavior is normal.          Assessment & Plan:   Preventive health examination [facility] close follow-up oncology History of hypertension.  Continue off antihypertensive medication.  Blood pressure low normal today History dyslipidemia.  Has been intolerant of statins except Crestor in the past Coronary artery calcification  Recheck 6 months or as needed  [name], MD

## 2015-07-22 NOTE — Patient Instructions (Signed)
It is important that you exercise regularly, at least 20 minutes 3 to 4 times per week.  If you develop chest pain or shortness of breath seek  medical attention.  Close oncology follow-up as scheduled  Limit your sodium (Salt) intake  Please check your blood pressure on a regular basis.  If it is consistently greater than 150/90, please make an office appointment.  Return in 6 months for follow-up

## 2015-07-28 ENCOUNTER — Telehealth: Payer: Self-pay | Admitting: *Deleted

## 2015-07-28 ENCOUNTER — Ambulatory Visit (HOSPITAL_BASED_OUTPATIENT_CLINIC_OR_DEPARTMENT_OTHER): Payer: Medicare Other

## 2015-07-28 ENCOUNTER — Other Ambulatory Visit: Payer: Self-pay | Admitting: Medical Oncology

## 2015-07-28 ENCOUNTER — Other Ambulatory Visit (HOSPITAL_BASED_OUTPATIENT_CLINIC_OR_DEPARTMENT_OTHER): Payer: Medicare Other

## 2015-07-28 ENCOUNTER — Telehealth: Payer: Self-pay | Admitting: Internal Medicine

## 2015-07-28 ENCOUNTER — Ambulatory Visit (HOSPITAL_COMMUNITY)
Admission: RE | Admit: 2015-07-28 | Discharge: 2015-07-28 | Disposition: A | Discharge status: Home / Self Care | Payer: Medicare Other | Source: Ambulatory Visit | Attending: Internal Medicine | Admitting: Internal Medicine

## 2015-07-28 ENCOUNTER — Telehealth: Payer: Self-pay | Admitting: Medical Oncology

## 2015-07-28 DIAGNOSIS — C3491 Malignant neoplasm of unspecified part of right bronchus or lung: Secondary | ICD-10-CM

## 2015-07-28 DIAGNOSIS — D649 Anemia, unspecified: Secondary | ICD-10-CM | POA: Insufficient documentation

## 2015-07-28 DIAGNOSIS — Z95828 Presence of other vascular implants and grafts: Secondary | ICD-10-CM

## 2015-07-28 LAB — CBC WITH DIFFERENTIAL/PLATELET
BASO%: 1 % (ref 0.0–2.0)
Basophils Absolute: 0.1 10*3/uL (ref 0.0–0.1)
EOS%: 0.7 % (ref 0.0–7.0)
Eosinophils Absolute: 0.1 10*3/uL (ref 0.0–0.5)
HCT: 24.4 % — ABNORMAL LOW (ref 38.4–49.9)
HEMOGLOBIN: 7.9 g/dL — AB (ref 13.0–17.1)
LYMPH%: 17 % (ref 14.0–49.0)
MCH: 30.7 pg (ref 27.2–33.4)
MCHC: 32.4 g/dL (ref 32.0–36.0)
MCV: 94.8 fL (ref 79.3–98.0)
MONO#: 0.8 10*3/uL (ref 0.1–0.9)
MONO%: 10.1 % (ref 0.0–14.0)
NEUT%: 71.2 % (ref 39.0–75.0)
NEUTROS ABS: 5.8 10*3/uL (ref 1.5–6.5)
Platelets: 85 10*3/uL — ABNORMAL LOW (ref 140–400)
RBC: 2.57 10*6/uL — AB (ref 4.20–5.82)
RDW: 18.7 % — AB (ref 11.0–14.6)
WBC: 8.2 10*3/uL (ref 4.0–10.3)
lymph#: 1.4 10*3/uL (ref 0.9–3.3)

## 2015-07-28 LAB — COMPREHENSIVE METABOLIC PANEL
ALBUMIN: 2.9 g/dL — AB (ref 3.5–5.0)
ALK PHOS: 200 U/L — AB (ref 40–150)
ALT: 19 U/L (ref 0–55)
ANION GAP: 8 meq/L (ref 3–11)
AST: 23 U/L (ref 5–34)
BILIRUBIN TOTAL: 0.43 mg/dL (ref 0.20–1.20)
BUN: 7.6 mg/dL (ref 7.0–26.0)
CO2: 24 meq/L (ref 22–29)
CREATININE: 1 mg/dL (ref 0.7–1.3)
Calcium: 8.5 mg/dL (ref 8.4–10.4)
Chloride: 105 mEq/L (ref 98–109)
EGFR: 77 mL/min/{1.73_m2} — AB (ref >90)
GLUCOSE: 170 mg/dL — AB (ref 70–140)
Potassium: 3.9 mEq/L (ref 3.5–5.1)
SODIUM: 137 meq/L (ref 136–145)
TOTAL PROTEIN: 6.5 g/dL (ref 6.4–8.3)

## 2015-07-28 MED ORDER — SODIUM CHLORIDE 0.9% FLUSH
10.0000 mL | INTRAVENOUS | Status: DC | PRN
  Administered 2015-07-28: 10 mL via INTRAVENOUS
  Filled 2015-07-28: qty 10

## 2015-07-28 MED ORDER — HEPARIN SOD (PORK) LOCK FLUSH 100 UNIT/ML IV SOLN
500.0000 [IU] | Freq: Once | INTRAVENOUS | Status: AC
  Administered 2015-07-28: 500 [IU] via INTRAVENOUS
  Filled 2015-07-28: qty 5

## 2015-07-28 NOTE — Telephone Encounter (Signed)
Pt notified and told to expect a call from the scheduler. HAR obtained.

## 2015-07-28 NOTE — Telephone Encounter (Signed)
-----   Message from Curt Bears, MD sent at 07/28/2015  1:51 PM EDT ----- Call patient with the result and he will need 2 units of PRBCs transfusion.

## 2015-07-28 NOTE — Progress Notes (Signed)
Quick Note:  Call patient with the result and he will need 2 units of PRBCs transfusion. ______

## 2015-07-28 NOTE — Telephone Encounter (Signed)
Per staff message and POF I have scheduled appts. Advised scheduler of appts. JMW  

## 2015-07-28 NOTE — Telephone Encounter (Signed)
spoke w/ pt confirmed 6/15 apt times

## 2015-07-30 ENCOUNTER — Other Ambulatory Visit: Payer: Self-pay | Admitting: Internal Medicine

## 2015-07-30 DIAGNOSIS — R112 Nausea with vomiting, unspecified: Secondary | ICD-10-CM

## 2015-07-31 ENCOUNTER — Other Ambulatory Visit: Payer: Medicare Other

## 2015-07-31 ENCOUNTER — Telehealth: Payer: Self-pay | Admitting: Medical Oncology

## 2015-07-31 ENCOUNTER — Ambulatory Visit (HOSPITAL_BASED_OUTPATIENT_CLINIC_OR_DEPARTMENT_OTHER): Payer: Medicare Other

## 2015-07-31 ENCOUNTER — Other Ambulatory Visit: Payer: Self-pay | Admitting: Medical Oncology

## 2015-07-31 VITALS — BP 152/92 | HR 60 | Temp 98.6°F | Resp 18

## 2015-07-31 DIAGNOSIS — D649 Anemia, unspecified: Secondary | ICD-10-CM

## 2015-07-31 LAB — PREPARE RBC (CROSSMATCH)

## 2015-07-31 MED ORDER — ACETAMINOPHEN 325 MG PO TABS
650.0000 mg | ORAL_TABLET | Freq: Once | ORAL | Status: AC
Start: 2015-07-31 — End: 2015-07-31
  Administered 2015-07-31: 650 mg via ORAL

## 2015-07-31 MED ORDER — HEPARIN SOD (PORK) LOCK FLUSH 100 UNIT/ML IV SOLN
500.0000 [IU] | Freq: Every day | INTRAVENOUS | Status: AC | PRN
  Administered 2015-07-31: 500 [IU]
  Filled 2015-07-31: qty 5

## 2015-07-31 MED ORDER — SODIUM CHLORIDE 0.9 % IV SOLN
250.0000 mL | Freq: Once | INTRAVENOUS | Status: AC
  Administered 2015-07-31: 250 mL via INTRAVENOUS

## 2015-07-31 MED ORDER — DIPHENHYDRAMINE HCL 25 MG PO CAPS
ORAL_CAPSULE | ORAL | Status: AC
  Filled 2015-07-31: qty 1

## 2015-07-31 MED ORDER — SODIUM CHLORIDE 0.9% FLUSH
10.0000 mL | INTRAVENOUS | Status: AC | PRN
  Administered 2015-07-31: 10 mL
  Filled 2015-07-31: qty 10

## 2015-07-31 MED ORDER — DIPHENHYDRAMINE HCL 25 MG PO CAPS
25.0000 mg | ORAL_CAPSULE | Freq: Once | ORAL | Status: AC
  Administered 2015-07-31: 25 mg via ORAL

## 2015-07-31 MED ORDER — ACETAMINOPHEN 325 MG PO TABS
ORAL_TABLET | ORAL | Status: AC
  Filled 2015-07-31: qty 2

## 2015-07-31 NOTE — Telephone Encounter (Signed)
Pt takes narcotic q hs -Nurse reporting that pt wants to come off narcotic. He tried it and felt "woozy " when he did. "What is best way to  can he come off it " Per Mohamed I instructed pt to take tylenol PM and call us back tomorrow to see how he felt after taking it tonight.

## 2015-07-31 NOTE — Patient Instructions (Signed)

## 2015-08-01 LAB — TYPE AND SCREEN
ABO/RH(D): O POS
ANTIBODY SCREEN: NEGATIVE
UNIT DIVISION: 0
Unit division: 0

## 2015-08-04 ENCOUNTER — Ambulatory Visit (HOSPITAL_BASED_OUTPATIENT_CLINIC_OR_DEPARTMENT_OTHER): Payer: Medicare Other | Admitting: Internal Medicine

## 2015-08-04 ENCOUNTER — Encounter: Payer: Self-pay | Admitting: *Deleted

## 2015-08-04 ENCOUNTER — Other Ambulatory Visit (HOSPITAL_BASED_OUTPATIENT_CLINIC_OR_DEPARTMENT_OTHER): Payer: Medicare Other

## 2015-08-04 ENCOUNTER — Ambulatory Visit: Payer: Medicare Other

## 2015-08-04 ENCOUNTER — Encounter: Payer: Self-pay | Admitting: Internal Medicine

## 2015-08-04 ENCOUNTER — Ambulatory Visit (HOSPITAL_BASED_OUTPATIENT_CLINIC_OR_DEPARTMENT_OTHER): Payer: Medicare Other

## 2015-08-04 VITALS — BP 133/64 | HR 66 | Temp 98.3°F | Resp 18 | Ht 69.0 in | Wt 143.7 lb

## 2015-08-04 DIAGNOSIS — C342 Malignant neoplasm of middle lobe, bronchus or lung: Secondary | ICD-10-CM | POA: Diagnosis present

## 2015-08-04 DIAGNOSIS — C3491 Malignant neoplasm of unspecified part of right bronchus or lung: Secondary | ICD-10-CM

## 2015-08-04 DIAGNOSIS — C3431 Malignant neoplasm of lower lobe, right bronchus or lung: Secondary | ICD-10-CM

## 2015-08-04 DIAGNOSIS — C7989 Secondary malignant neoplasm of other specified sites: Secondary | ICD-10-CM | POA: Diagnosis not present

## 2015-08-04 DIAGNOSIS — J90 Pleural effusion, not elsewhere classified: Secondary | ICD-10-CM

## 2015-08-04 DIAGNOSIS — C787 Secondary malignant neoplasm of liver and intrahepatic bile duct: Secondary | ICD-10-CM

## 2015-08-04 DIAGNOSIS — C786 Secondary malignant neoplasm of retroperitoneum and peritoneum: Secondary | ICD-10-CM | POA: Diagnosis not present

## 2015-08-04 DIAGNOSIS — Z95828 Presence of other vascular implants and grafts: Secondary | ICD-10-CM

## 2015-08-04 DIAGNOSIS — D6481 Anemia due to antineoplastic chemotherapy: Secondary | ICD-10-CM | POA: Diagnosis not present

## 2015-08-04 DIAGNOSIS — Z5111 Encounter for antineoplastic chemotherapy: Secondary | ICD-10-CM | POA: Diagnosis present

## 2015-08-04 LAB — CBC WITH DIFFERENTIAL/PLATELET
BASO%: 0.4 % (ref 0.0–2.0)
Basophils Absolute: 0 10*3/uL (ref 0.0–0.1)
EOS ABS: 0.1 10*3/uL (ref 0.0–0.5)
EOS%: 0.4 % (ref 0.0–7.0)
HCT: 32.9 % — ABNORMAL LOW (ref 38.4–49.9)
HGB: 10.7 g/dL — ABNORMAL LOW (ref 13.0–17.1)
LYMPH%: 13.9 % — AB (ref 14.0–49.0)
MCH: 30.1 pg (ref 27.2–33.4)
MCHC: 32.6 g/dL (ref 32.0–36.0)
MCV: 92.3 fL (ref 79.3–98.0)
MONO#: 1.6 10*3/uL — ABNORMAL HIGH (ref 0.1–0.9)
MONO%: 12.3 % (ref 0.0–14.0)
NEUT%: 73 % (ref 39.0–75.0)
NEUTROS ABS: 9.8 10*3/uL — AB (ref 1.5–6.5)
PLATELETS: 320 10*3/uL (ref 140–400)
RBC: 3.57 10*6/uL — AB (ref 4.20–5.82)
RDW: 18.1 % — ABNORMAL HIGH (ref 11.0–14.6)
WBC: 13.4 10*3/uL — AB (ref 4.0–10.3)
lymph#: 1.9 10*3/uL (ref 0.9–3.3)

## 2015-08-04 LAB — COMPREHENSIVE METABOLIC PANEL
ALT: 19 U/L (ref 0–55)
ANION GAP: 8 meq/L (ref 3–11)
AST: 26 U/L (ref 5–34)
Albumin: 3.1 g/dL — ABNORMAL LOW (ref 3.5–5.0)
Alkaline Phosphatase: 183 U/L — ABNORMAL HIGH (ref 40–150)
BILIRUBIN TOTAL: 0.47 mg/dL (ref 0.20–1.20)
BUN: 9.1 mg/dL (ref 7.0–26.0)
CHLORIDE: 109 meq/L (ref 98–109)
CO2: 21 meq/L — AB (ref 22–29)
Calcium: 8.9 mg/dL (ref 8.4–10.4)
Creatinine: 0.8 mg/dL (ref 0.7–1.3)
GLUCOSE: 95 mg/dL (ref 70–140)
Potassium: 4 mEq/L (ref 3.5–5.1)
SODIUM: 138 meq/L (ref 136–145)
TOTAL PROTEIN: 7.2 g/dL (ref 6.4–8.3)

## 2015-08-04 MED ORDER — SODIUM CHLORIDE 0.9 % IV SOLN
10.0000 mg | Freq: Once | INTRAVENOUS | Status: AC
  Administered 2015-08-04: 10 mg via INTRAVENOUS
  Filled 2015-08-04: qty 1

## 2015-08-04 MED ORDER — HEPARIN SOD (PORK) LOCK FLUSH 100 UNIT/ML IV SOLN
500.0000 [IU] | Freq: Once | INTRAVENOUS | Status: AC | PRN
  Administered 2015-08-04: 500 [IU]
  Filled 2015-08-04: qty 5

## 2015-08-04 MED ORDER — PALONOSETRON HCL INJECTION 0.25 MG/5ML
INTRAVENOUS | Status: AC
  Filled 2015-08-04: qty 5

## 2015-08-04 MED ORDER — SODIUM CHLORIDE 0.9 % IV SOLN
492.5000 mg | Freq: Once | INTRAVENOUS | Status: AC
  Administered 2015-08-04: 490 mg via INTRAVENOUS
  Filled 2015-08-04: qty 49

## 2015-08-04 MED ORDER — SODIUM CHLORIDE 0.9 % IV SOLN
Freq: Once | INTRAVENOUS | Status: AC
  Administered 2015-08-04: 13:00:00 via INTRAVENOUS

## 2015-08-04 MED ORDER — SODIUM CHLORIDE 0.9% FLUSH
10.0000 mL | INTRAVENOUS | Status: DC | PRN
  Administered 2015-08-04: 10 mL
  Filled 2015-08-04: qty 10

## 2015-08-04 MED ORDER — PALONOSETRON HCL INJECTION 0.25 MG/5ML
0.2500 mg | Freq: Once | INTRAVENOUS | Status: AC
  Administered 2015-08-04: 0.25 mg via INTRAVENOUS

## 2015-08-04 MED ORDER — SODIUM CHLORIDE 0.9 % IJ SOLN
10.0000 mL | INTRAMUSCULAR | Status: DC | PRN
  Administered 2015-08-04: 10 mL via INTRAVENOUS
  Filled 2015-08-04: qty 10

## 2015-08-04 MED ORDER — SODIUM CHLORIDE 0.9 % IV SOLN
100.0000 mg/m2 | Freq: Once | INTRAVENOUS | Status: AC
  Administered 2015-08-04: 190 mg via INTRAVENOUS
  Filled 2015-08-04: qty 9.5

## 2015-08-04 NOTE — Patient Instructions (Signed)
[facility] Discharge Instructions for Patients Receiving Chemotherapy  Today you received the following chemotherapy agents:  Carboplatin, Etoposide.  To help prevent nausea and vomiting after your treatment, we encourage you to take your nausea medication as prescribed.   If you develop nausea and vomiting that is not controlled by your nausea medication, call the clinic.   BELOW ARE SYMPTOMS THAT SHOULD BE REPORTED IMMEDIATELY:  *FEVER GREATER THAN 100.5 F  *CHILLS WITH OR WITHOUT FEVER  NAUSEA AND VOMITING THAT IS NOT CONTROLLED WITH YOUR NAUSEA MEDICATION  *UNUSUAL SHORTNESS OF BREATH  *UNUSUAL BRUISING OR BLEEDING  TENDERNESS IN MOUTH AND THROAT WITH OR WITHOUT PRESENCE OF ULCERS  *URINARY PROBLEMS  *BOWEL PROBLEMS  UNUSUAL RASH Items with * indicate a potential emergency and should be followed up as soon as possible.  Feel free to call the clinic you have any questions or concerns. The clinic phone number is [phone].  Please show the [facility] at check-in to the Emergency Department and triage nurse.

## 2015-08-04 NOTE — Progress Notes (Addendum)
[facility] [phone]   [phone]  OFFICE PROGRESS NOTE  [name], MD [place] [address]  DIAGNOSIS: Extensive stage (T3, N3, M1b) small cell lung cancer presented with large right middle and lower lobe mass in addition to extensive mediastinal lymphadenopathy, right pleural effusion as well as extensive liver metastasis and retroperitoneal lymphadenopathy diagnosed in April 2017.  PRIOR THERAPY: None.  CURRENT THERAPY: Systemic chemotherapy with reduced dose carboplatin for AUC of 3 on day 1 and etoposide 60 MG/M2 on days 1, 2 and 3. First dose was given at [facility]. Status post 3 cycles. Starting from cycle #3 his carboplatin is for AUC of 5 and etoposide 100 MG/M2.  INTERVAL HISTORY: Aalijah E Shea 65 y.o. male returns to the clinic today for follow-up visit accompanied by his wife. The patient feels much better and very ambulatory at this point. He tolerated the last cycle of his treatment was carboplatin and etoposide fairly well with the standard dose. He received 2 units of PRBCs transfusion secondary to chemotherapy-induced anemia. He denied having any significant chest pain, shortness breath, cough or hemoptysis. The patient denied having any fever or chills. He has no nausea or vomiting. He is here today to start cycle #4 of his treatment.  MEDICAL HISTORY: Past Medical History  Diagnosis Date  . CERVICAL RADICULOPATHY, RIGHT 08/12/2009  . DIVERTICULOSIS, COLON 08/15/2006  . GERD 08/15/2006  . HYPERLIPIDEMIA 08/15/2006  . HYPERTENSION 08/15/2006  . INTERNAL HEMORRHOIDS 02/29/2008  . SKIN CANCER, HX OF 08/15/2006  . CAP (community acquired pneumonia)     LLL 07/2014  . Empyema lung ([facility])     2016 on left    ALLERGIES:  is allergic to bee venom.  MEDICATIONS:  Current Outpatient Prescriptions  Medication Sig Dispense Refill  . acetaminophen (TYLENOL) 500 MG tablet Take 1,000 mg by  mouth every 8 (eight) hours as needed.    . citalopram (CELEXA) 40 MG tablet Take 0.5 tablets (20 mg total) by mouth daily.    Marland Kitchen lidocaine-prilocaine (EMLA) cream Apply to port -a-cath 1-2 hours prior to access. 30 g 2  . polyethylene glycol (MIRALAX / GLYCOLAX) packet Take 17 g by mouth daily as needed for mild constipation.    . diphenhydrAMINE (SOMINEX) 25 MG tablet Take 25 mg by mouth at bedtime as needed for sleep. Reported on 08/04/2015    . EPINEPHrine 0.3 mg/0.3 mL IJ SOAJ injection Inject 0.3 mLs (0.3 mg total) into the muscle once. (Patient not taking: Reported on 08/04/2015) 1 Device 2  . oxyCODONE (ROXICODONE) 5 MG immediate release tablet TAKE 1-2 TABLETS BY MOUTH EVERY 4-6 HOURS AS NEEDED FOR SEVERE PAIN (Patient not taking: Reported on 08/04/2015) 20 tablet 0  . prochlorperazine (COMPAZINE) 10 MG tablet Take 1 tablet (10 mg total) by mouth every 6 (six) hours as needed for nausea or vomiting. (Patient not taking: Reported on 08/04/2015) 30 tablet 0  . senna (SENOKOT) 8.6 MG TABS tablet Take 1 tablet (8.6 mg total) by mouth daily. (Patient not taking: Reported on 08/04/2015)  0   No current facility-administered medications for this visit.    SURGICAL HISTORY:  Past Surgical History  Procedure Laterality Date  . Tonsillectomy    . Video bronchoscopy N/A 07/27/2014    Procedure: VIDEO BRONCHOSCOPY;  Surgeon: [name], MD;  Location: [facility] OR;  Service: Thoracic;  Laterality: N/A;  . Video assisted thoracoscopy (vats)/empyema Left 07/27/2014    Procedure:  VIDEO ASSISTED THORACOSCOPY (VATS)/EMPYEMA Left, drainage pleural effusion.;  Surgeon: [name], MD;  Location: [facility];  Service: Thoracic;  Laterality: Left;  . Video bronchoscopy Bilateral 05/20/2015    Procedure: VIDEO BRONCHOSCOPY WITH FLUORO;  Surgeon: Collene Gobble, MD;  Location: [facility];  Service: Cardiopulmonary;  Laterality: Bilateral;    REVIEW OF SYSTEMS:  A comprehensive review of systems was negative  except for: Constitutional: positive for fatigue   PHYSICAL EXAMINATION: General appearance: alert, cooperative, fatigued and no distress Head: Normocephalic, without obvious abnormality, atraumatic Neck: no adenopathy, no JVD, supple, symmetrical, trachea midline and thyroid not enlarged, symmetric, no tenderness/mass/nodules Lymph nodes: Cervical, supraclavicular, and axillary nodes normal. Resp: clear to auscultation bilaterally Back: symmetric, no curvature. ROM normal. No CVA tenderness. Cardio: regular rate and rhythm, S1, S2 normal, no murmur, click, rub or gallop GI: soft, non-tender; bowel sounds normal; no masses,  no organomegaly Extremities: extremities normal, atraumatic, no cyanosis or edema Neurologic: Alert and oriented X 3, normal strength and tone. Normal symmetric reflexes. Normal coordination and gait  ECOG PERFORMANCE STATUS: 2 - Symptomatic, <50% confined to bed  Blood pressure 133/64, pulse 66, temperature 98.3 F (36.8 C), temperature source Oral, resp. rate 18, height '5\' 9"'$  (1.753 m), weight 143 lb 11.2 oz (65.182 kg), SpO2 99 %.  LABORATORY DATA: Lab Results  Component Value Date   WBC 13.4* 08/04/2015   HGB 10.7* 08/04/2015   HCT 32.9* 08/04/2015   MCV 92.3 08/04/2015   PLT 320 08/04/2015      Chemistry      Component Value Date/Time   NA 138 08/04/2015 1108   NA 137 06/03/2015 0332   K 4.0 08/04/2015 1108   K 4.3 06/03/2015 0332   CL 108 06/03/2015 0332   CO2 21* 08/04/2015 1108   CO2 18* 06/03/2015 0332   BUN 9.1 08/04/2015 1108   BUN 89* 06/03/2015 0332   CREATININE 0.8 08/04/2015 1108   CREATININE 1.29* 06/03/2015 0332      Component Value Date/Time   CALCIUM 8.9 08/04/2015 1108   CALCIUM 8.0* 06/03/2015 0332   ALKPHOS 183* 08/04/2015 1108   ALKPHOS 757* 06/02/2015 0334   AST 26 08/04/2015 1108   AST 585* 06/02/2015 0334   ALT 19 08/04/2015 1108   ALT 121* 06/02/2015 0334   BILITOT 0.47 08/04/2015 1108   BILITOT 4.5* 06/02/2015 0334         RADIOGRAPHIC STUDIES: Ct Chest W Contrast  07/11/2015  CLINICAL DATA:  65 year old male with history of lung cancer diagnosed in April 2017, currently undergoing chemotherapy. Restaging examination. EXAM: CT CHEST, ABDOMEN, AND PELVIS WITH CONTRAST TECHNIQUE: Multidetector CT imaging of the chest, abdomen and pelvis was performed following the standard protocol during bolus administration of intravenous contrast. CONTRAST:  120m ISOVUE-300 IOPAMIDOL (ISOVUE-300) INJECTION 61% COMPARISON:  CT of the chest, abdomen and pelvis 05/18/2015. FINDINGS: CT CHEST FINDINGS Mediastinum/Lymph Nodes: Heart size is normal. There is no significant pericardial fluid, thickening or pericardial calcification. There is atherosclerosis of the thoracic aorta, the great vessels of the mediastinum and the coronary arteries, including calcified atherosclerotic plaque in the left anterior descending and right coronary arteries. The previously noted mediastinal and right hilar lymphadenopathy has significantly decreased in size. Largest mediastinal lymph node is in the subcarinal nodal station measuring 2.2 cm in short axis (previously 3.4 cm). High right paratracheal lymph node measuring 1.4 cm also decreased compared to the prior examination (previously 2.9 cm). Esophagus is unremarkable in appearance. No axillary lymphadenopathy. Right internal  jugular single-lumen porta cath with tip terminating in the distal superior vena cava. Lungs/Pleura: Previously noted mass in the perihilar aspect of the right middle lobe appears decreased in size compared to the prior study, currently measuring 3.6 x 4.3 cm. This continues to encase the right middle lobe bronchi and is associated with near complete atelectasis and postobstructive changes in the right middle lobe. There also appears to be direct extension across the major fissure, with involvement of the central aspect of the right lower lobe, as evidenced by profound thickening of  the peribronchovascular interstitium throughout the right lower lobe. Areas of septal thickening and nodularity are noted within right middle and lower lobes, suggesting some lymphangitic spread of disease. No suspicious nodules are noted in the left lung. Moderate to large right-sided pleural effusion appears slightly increased compared to the prior study, and there is pleural thickening and nodularity, compatible with a malignant pleural effusion. Mild diffuse bronchial wall thickening with mild centrilobular and paraseptal emphysema. Musculoskeletal/Soft Tissues: There are no aggressive appearing lytic or blastic lesions noted in the visualized portions of the skeleton. CT ABDOMEN AND PELVIS FINDINGS Hepatobiliary: There are innumerable hypovascular areas scattered throughout the liver, concerning for widespread metastatic disease, largest of which measures 1.4 cm in segment 4A (image 53 of series 2). No intra or extrahepatic biliary ductal dilatation. Gallbladder is normal in appearance. Pancreas: No pancreatic mass. No pancreatic ductal dilatation. No pancreatic or peripancreatic fluid or inflammatory changes. Spleen: Unremarkable. Adrenals/Urinary Tract: 1.4 cm low-attenuation lesion in the interpolar region of the left kidney is compatible with a simple cyst. Right kidney and bilateral adrenal glands are normal in appearance. No hydroureteronephrosis. Urinary bladder is normal in appearance. Stomach/Bowel: Stomach is normal in appearance. No pathologic dilatation of small bowel or colon. Several colonic diverticulae are noted, without surrounding inflammatory changes to suggest an acute diverticulitis at this time. Normal appendix. Vascular/Lymphatic: Atherosclerosis throughout the abdominal and pelvic vasculature, and including what appears to be in ulcerated plaque in the distal left common iliac artery which measures up to 1.3 cm in diameter. Mildly enlarged upper right retrocaval lymph node measuring 11  mm in short axis. No other lymphadenopathy noted elsewhere in the abdomen or pelvis. Reproductive: Prostate gland and seminal vesicles are unremarkable in appearance. Other: No significant volume of ascites.  No pneumoperitoneum. Musculoskeletal: There are no aggressive appearing lytic or blastic lesions noted in the visualized portions of the skeleton. IMPRESSION: 1. Today's study demonstrates a positive response to therapy as demonstrated by decreased size of the primary right middle lobe mass, and decreasing right hilar and mediastinal lymphadenopathy. There is evidence of probable lymphangitic spread of disease in the right lung, which also appears slightly decreased compared to the prior study. 2. 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 3. Numerous hypovascular hepatic lesions, highly concerning for widespread metastatic disease to the liver. 4. Colonic diverticulosis without evidence of acute diverticulitis at this time. 5. Extensive atherosclerosis, including 2 vessel coronary artery disease. 6. Additional incidental findings, as above. Electronically Signed   By: Vinnie Langton M.D.   On: 07/11/2015 15:31   Ct Abdomen Pelvis W Contrast  07/11/2015  CLINICAL DATA:  65 year old male with history of lung cancer diagnosed in April 2017, currently undergoing chemotherapy. Restaging examination. EXAM: CT CHEST, ABDOMEN, AND PELVIS WITH CONTRAST TECHNIQUE: Multidetector CT imaging of the chest, abdomen and pelvis was performed following the standard protocol during bolus administration of intravenous contrast. CONTRAST:  186m  ISOVUE-300 IOPAMIDOL (ISOVUE-300) INJECTION 61% COMPARISON:  CT of the chest, abdomen and pelvis 05/18/2015. FINDINGS: CT CHEST FINDINGS Mediastinum/Lymph Nodes: Heart size is normal. There is no significant pericardial fluid, thickening or pericardial calcification. There is atherosclerosis of the thoracic aorta,  the great vessels of the mediastinum and the coronary arteries, including calcified atherosclerotic plaque in the left anterior descending and right coronary arteries. The previously noted mediastinal and right hilar lymphadenopathy has significantly decreased in size. Largest mediastinal lymph node is in the subcarinal nodal station measuring 2.2 cm in short axis (previously 3.4 cm). High right paratracheal lymph node measuring 1.4 cm also decreased compared to the prior examination (previously 2.9 cm). Esophagus is unremarkable in appearance. No axillary lymphadenopathy. Right internal jugular single-lumen porta cath with tip terminating in the distal superior vena cava. Lungs/Pleura: Previously noted mass in the perihilar aspect of the right middle lobe appears decreased in size compared to the prior study, currently measuring 3.6 x 4.3 cm. This continues to encase the right middle lobe bronchi and is associated with near complete atelectasis and postobstructive changes in the right middle lobe. There also appears to be direct extension across the major fissure, with involvement of the central aspect of the right lower lobe, as evidenced by profound thickening of the peribronchovascular interstitium throughout the right lower lobe. Areas of septal thickening and nodularity are noted within right middle and lower lobes, suggesting some lymphangitic spread of disease. No suspicious nodules are noted in the left lung. Moderate to large right-sided pleural effusion appears slightly increased compared to the prior study, and there is pleural thickening and nodularity, compatible with a malignant pleural effusion. Mild diffuse bronchial wall thickening with mild centrilobular and paraseptal emphysema. Musculoskeletal/Soft Tissues: There are no aggressive appearing lytic or blastic lesions noted in the visualized portions of the skeleton. CT ABDOMEN AND PELVIS FINDINGS Hepatobiliary: There are innumerable hypovascular  areas scattered throughout the liver, concerning for widespread metastatic disease, largest of which measures 1.4 cm in segment 4A (image 53 of series 2). No intra or extrahepatic biliary ductal dilatation. Gallbladder is normal in appearance. Pancreas: No pancreatic mass. No pancreatic ductal dilatation. No pancreatic or peripancreatic fluid or inflammatory changes. Spleen: Unremarkable. Adrenals/Urinary Tract: 1.4 cm low-attenuation lesion in the interpolar region of the left kidney is compatible with a simple cyst. Right kidney and bilateral adrenal glands are normal in appearance. No hydroureteronephrosis. Urinary bladder is normal in appearance. Stomach/Bowel: Stomach is normal in appearance. No pathologic dilatation of small bowel or colon. Several colonic diverticulae are noted, without surrounding inflammatory changes to suggest an acute diverticulitis at this time. Normal appendix. Vascular/Lymphatic: Atherosclerosis throughout the abdominal and pelvic vasculature, and including what appears to be in ulcerated plaque in the distal left common iliac artery which measures up to 1.3 cm in diameter. Mildly enlarged upper right retrocaval lymph node measuring 11 mm in short axis. No other lymphadenopathy noted elsewhere in the abdomen or pelvis. Reproductive: Prostate gland and seminal vesicles are unremarkable in appearance. Other: No significant volume of ascites.  No pneumoperitoneum. Musculoskeletal: There are no aggressive appearing lytic or blastic lesions noted in the visualized portions of the skeleton. IMPRESSION: 1. Today's study demonstrates a positive response to therapy as demonstrated by decreased size of the primary right middle lobe mass, and decreasing right hilar and mediastinal lymphadenopathy. There is evidence of probable lymphangitic spread of disease in the right lung, which also appears slightly decreased compared to the prior study. 2. Moderate to large malignant right pleural  effusion  appears slightly increased compared to the prior examination. 3. Numerous hypovascular hepatic lesions, highly concerning for widespread metastatic disease to the liver. 4. Colonic diverticulosis without evidence of acute diverticulitis at this time. 5. Extensive atherosclerosis, including 2 vessel coronary artery disease. 6. Additional incidental findings, as above. Electronically Signed   By: Vinnie Langton M.D.   On: 07/11/2015 15:31    ASSESSMENT AND PLAN: This is a very pleasant 65 years old white male recently diagnosed with extensive stage small cell lung cancer status post 3 cycles of chemotherapy with carboplatin and etoposide. The patient is tolerating his treatment fairly well with no significant adverse effects except for mild fatigue secondary to chemotherapy-induced anemia requiring PRBCs transfusion. I recommended for the patient to proceed with cycle #4 today as a scheduled. He would come back for follow-up visit in 4 weeks for evaluation after repeating CT scan of the chest, abdomen and pelvis for restaging of his disease. The patient continues to have good response to the treatment, I may consider him for enrollment in the ROVA-T clinical trial. He was advised to call immediately if he has any concerning symptoms in the interval. The patient voices understanding of current disease status and treatment options and is in agreement with the current care plan.  All questions were answered. The patient knows to call the clinic with any problems, questions or concerns. We can certainly see the patient much sooner if necessary.  Disclaimer: This note was dictated with voice recognition software. Similar sounding words can inadvertently be transcribed and may not be corrected upon review.

## 2015-08-04 NOTE — Patient Instructions (Signed)

## 2015-08-05 ENCOUNTER — Ambulatory Visit (HOSPITAL_BASED_OUTPATIENT_CLINIC_OR_DEPARTMENT_OTHER): Payer: Medicare Other

## 2015-08-05 VITALS — BP 148/73 | HR 56 | Temp 98.2°F | Resp 18

## 2015-08-05 DIAGNOSIS — C3491 Malignant neoplasm of unspecified part of right bronchus or lung: Secondary | ICD-10-CM

## 2015-08-05 DIAGNOSIS — Z5111 Encounter for antineoplastic chemotherapy: Secondary | ICD-10-CM | POA: Diagnosis present

## 2015-08-05 MED ORDER — SODIUM CHLORIDE 0.9 % IV SOLN
Freq: Once | INTRAVENOUS | Status: AC
  Administered 2015-08-05: 11:00:00 via INTRAVENOUS

## 2015-08-05 MED ORDER — HEPARIN SOD (PORK) LOCK FLUSH 100 UNIT/ML IV SOLN
500.0000 [IU] | Freq: Once | INTRAVENOUS | Status: AC | PRN
Start: 2015-08-05 — End: 2015-08-05
  Administered 2015-08-05: 500 [IU]
  Filled 2015-08-05: qty 5

## 2015-08-05 MED ORDER — SODIUM CHLORIDE 0.9 % IV SOLN
100.0000 mg/m2 | Freq: Once | INTRAVENOUS | Status: AC
  Administered 2015-08-05: 190 mg via INTRAVENOUS
  Filled 2015-08-05: qty 9.5

## 2015-08-05 MED ORDER — SODIUM CHLORIDE 0.9 % IV SOLN
10.0000 mg | Freq: Once | INTRAVENOUS | Status: AC
  Administered 2015-08-05: 10 mg via INTRAVENOUS
  Filled 2015-08-05: qty 1

## 2015-08-05 MED ORDER — SODIUM CHLORIDE 0.9% FLUSH
10.0000 mL | INTRAVENOUS | Status: DC | PRN
  Administered 2015-08-05: 10 mL
  Filled 2015-08-05: qty 10

## 2015-08-05 NOTE — Patient Instructions (Signed)
[facility] Discharge Instructions for Patients Receiving Chemotherapy  Today you received the following chemotherapy agents Etoposide.   To help prevent nausea and vomiting after your treatment, we encourage you to take your nausea medication as prescribed.   If you develop nausea and vomiting that is not controlled by your nausea medication, call the clinic.   BELOW ARE SYMPTOMS THAT SHOULD BE REPORTED IMMEDIATELY:  *FEVER GREATER THAN 100.5 F  *CHILLS WITH OR WITHOUT FEVER  NAUSEA AND VOMITING THAT IS NOT CONTROLLED WITH YOUR NAUSEA MEDICATION  *UNUSUAL SHORTNESS OF BREATH  *UNUSUAL BRUISING OR BLEEDING  TENDERNESS IN MOUTH AND THROAT WITH OR WITHOUT PRESENCE OF ULCERS  *URINARY PROBLEMS  *BOWEL PROBLEMS  UNUSUAL RASH Items with * indicate a potential emergency and should be followed up as soon as possible.  Feel free to call the clinic you have any questions or concerns. The clinic phone number is [phone].  Please show the CHEMO ALERT CARD at check-in to the Emergency Department and triage nurse.   

## 2015-08-06 ENCOUNTER — Ambulatory Visit (HOSPITAL_BASED_OUTPATIENT_CLINIC_OR_DEPARTMENT_OTHER): Payer: Medicare Other

## 2015-08-06 VITALS — BP 152/68 | HR 58 | Temp 98.8°F | Resp 18

## 2015-08-06 DIAGNOSIS — Z5111 Encounter for antineoplastic chemotherapy: Secondary | ICD-10-CM | POA: Diagnosis present

## 2015-08-06 DIAGNOSIS — C3491 Malignant neoplasm of unspecified part of right bronchus or lung: Secondary | ICD-10-CM

## 2015-08-06 MED ORDER — SODIUM CHLORIDE 0.9 % IV SOLN
10.0000 mg | Freq: Once | INTRAVENOUS | Status: AC
  Administered 2015-08-06: 10 mg via INTRAVENOUS
  Filled 2015-08-06: qty 1

## 2015-08-06 MED ORDER — SODIUM CHLORIDE 0.9% FLUSH
10.0000 mL | INTRAVENOUS | Status: DC | PRN
  Administered 2015-08-06: 10 mL
  Filled 2015-08-06: qty 10

## 2015-08-06 MED ORDER — SODIUM CHLORIDE 0.9 % IV SOLN
Freq: Once | INTRAVENOUS | Status: AC
  Administered 2015-08-06: 11:00:00 via INTRAVENOUS

## 2015-08-06 MED ORDER — SODIUM CHLORIDE 0.9 % IV SOLN
100.0000 mg/m2 | Freq: Once | INTRAVENOUS | Status: AC
  Administered 2015-08-06: 190 mg via INTRAVENOUS
  Filled 2015-08-06: qty 9.5

## 2015-08-06 MED ORDER — HEPARIN SOD (PORK) LOCK FLUSH 100 UNIT/ML IV SOLN
500.0000 [IU] | Freq: Once | INTRAVENOUS | Status: AC | PRN
  Administered 2015-08-06: 500 [IU]
  Filled 2015-08-06: qty 5

## 2015-08-06 NOTE — Patient Instructions (Signed)
[facility] Discharge Instructions for Patients Receiving Chemotherapy  Today you received the following chemotherapy agents: Etoposide   To help prevent nausea and vomiting after your treatment, we encourage you to take your nausea medication as directed.    If you develop nausea and vomiting that is not controlled by your nausea medication, call the clinic.   BELOW ARE SYMPTOMS THAT SHOULD BE REPORTED IMMEDIATELY:  *FEVER GREATER THAN 100.5 F  *CHILLS WITH OR WITHOUT FEVER  NAUSEA AND VOMITING THAT IS NOT CONTROLLED WITH YOUR NAUSEA MEDICATION  *UNUSUAL SHORTNESS OF BREATH  *UNUSUAL BRUISING OR BLEEDING  TENDERNESS IN MOUTH AND THROAT WITH OR WITHOUT PRESENCE OF ULCERS  *URINARY PROBLEMS  *BOWEL PROBLEMS  UNUSUAL RASH Items with * indicate a potential emergency and should be followed up as soon as possible.  Feel free to call the clinic you have any questions or concerns. The clinic phone number is [phone].  Please show the CHEMO ALERT CARD at check-in to the Emergency Department and triage nurse.   

## 2015-08-07 ENCOUNTER — Ambulatory Visit (HOSPITAL_BASED_OUTPATIENT_CLINIC_OR_DEPARTMENT_OTHER): Payer: Medicare Other

## 2015-08-07 VITALS — BP 157/71 | HR 69 | Temp 98.1°F | Resp 18

## 2015-08-07 DIAGNOSIS — C3491 Malignant neoplasm of unspecified part of right bronchus or lung: Secondary | ICD-10-CM | POA: Diagnosis present

## 2015-08-07 DIAGNOSIS — Z5189 Encounter for other specified aftercare: Secondary | ICD-10-CM | POA: Diagnosis not present

## 2015-08-07 MED ORDER — PEGFILGRASTIM INJECTION 6 MG/0.6ML ~~LOC~~
6.0000 mg | PREFILLED_SYRINGE | Freq: Once | SUBCUTANEOUS | Status: AC
  Administered 2015-08-07: 6 mg via SUBCUTANEOUS
  Filled 2015-08-07: qty 0.6

## 2015-08-07 NOTE — Patient Instructions (Signed)
Pegfilgrastim injection What is this medicine? PEGFILGRASTIM (PEG fil gra stim) is a long-acting granulocyte colony-stimulating factor that stimulates the growth of neutrophils, a type of white blood cell important in the body's fight against infection. It is used to reduce the incidence of fever and infection in patients with certain types of cancer who are receiving chemotherapy that affects the bone marrow, and to increase survival after being exposed to high doses of radiation. This medicine may be used for other purposes; ask your health care provider or pharmacist if you have questions. What should I tell my health care provider before I take this medicine? They need to know if you have any of these conditions: -kidney disease -latex allergy -ongoing radiation therapy -sickle cell disease -skin reactions to acrylic adhesives (On-Body Injector only) -an unusual or allergic reaction to pegfilgrastim, filgrastim, other medicines, foods, dyes, or preservatives -pregnant or trying to get pregnant -breast-feeding How should I use this medicine? This medicine is for injection under the skin. If you get this medicine at home, you will be taught how to prepare and give the pre-filled syringe or how to use the On-body Injector. Refer to the patient Instructions for Use for detailed instructions. Use exactly as directed. Take your medicine at regular intervals. Do not take your medicine more often than directed. It is important that you put your used needles and syringes in a special sharps container. Do not put them in a trash can. If you do not have a sharps container, call your pharmacist or healthcare provider to get one. Talk to your pediatrician regarding the use of this medicine in children. While this drug may be prescribed for selected conditions, precautions do apply. Overdosage: If you think you have taken too much of this medicine contact a poison control center or emergency room at  once. NOTE: This medicine is only for you. Do not share this medicine with others. What if I miss a dose? It is important not to miss your dose. Call your doctor or health care professional if you miss your dose. If you miss a dose due to an On-body Injector failure or leakage, a new dose should be administered as soon as possible using a single prefilled syringe for manual use. What may interact with this medicine? Interactions have not been studied. Give your health care provider a list of all the medicines, herbs, non-prescription drugs, or dietary supplements you use. Also tell them if you smoke, drink alcohol, or use illegal drugs. Some items may interact with your medicine. This list may not describe all possible interactions. Give your health care provider a list of all the medicines, herbs, non-prescription drugs, or dietary supplements you use. Also tell them if you smoke, drink alcohol, or use illegal drugs. Some items may interact with your medicine. What should I watch for while using this medicine? You may need blood work done while you are taking this medicine. If you are going to need a MRI, CT scan, or other procedure, tell your doctor that you are using this medicine (On-Body Injector only). What side effects may I notice from receiving this medicine? Side effects that you should report to your doctor or health care professional as soon as possible: -allergic reactions like skin rash, itching or hives, swelling of the face, lips, or tongue -dizziness -fever -pain, redness, or irritation at site where injected -pinpoint red spots on the skin -red or dark-brown urine -shortness of breath or breathing problems -stomach or side pain, or pain   at the shoulder -swelling -tiredness -trouble passing urine or change in the amount of urine Side effects that usually do not require medical attention (report to your doctor or health care professional if they continue or are  bothersome): -bone pain -muscle pain This list may not describe all possible side effects. Call your doctor for medical advice about side effects. You may report side effects to FDA at 1-800-FDA-1088. Where should I keep my medicine? Keep out of the reach of children. Store pre-filled syringes in a refrigerator between 2 and 8 degrees C (36 and 46 degrees F). Do not freeze. Keep in carton to protect from light. Throw away this medicine if it is left out of the refrigerator for more than 48 hours. Throw away any unused medicine after the expiration date. NOTE: This sheet is a summary. It may not cover all possible information. If you have questions about this medicine, talk to your doctor, pharmacist, or health care provider.    2016, Elsevier/Gold Standard. (2014-02-21 14:30:14)  

## 2015-08-08 ENCOUNTER — Ambulatory Visit: Payer: Medicare Other

## 2015-08-10 ENCOUNTER — Other Ambulatory Visit: Payer: Self-pay | Admitting: Internal Medicine

## 2015-08-11 ENCOUNTER — Ambulatory Visit (HOSPITAL_BASED_OUTPATIENT_CLINIC_OR_DEPARTMENT_OTHER): Payer: Medicare Other

## 2015-08-11 ENCOUNTER — Other Ambulatory Visit (HOSPITAL_BASED_OUTPATIENT_CLINIC_OR_DEPARTMENT_OTHER): Payer: Medicare Other

## 2015-08-11 DIAGNOSIS — C3491 Malignant neoplasm of unspecified part of right bronchus or lung: Secondary | ICD-10-CM

## 2015-08-11 DIAGNOSIS — Z95828 Presence of other vascular implants and grafts: Secondary | ICD-10-CM

## 2015-08-11 LAB — COMPREHENSIVE METABOLIC PANEL
ALT: 30 U/L (ref 0–55)
ANION GAP: 10 meq/L (ref 3–11)
AST: 27 U/L (ref 5–34)
Albumin: 3.2 g/dL — ABNORMAL LOW (ref 3.5–5.0)
Alkaline Phosphatase: 190 U/L — ABNORMAL HIGH (ref 40–150)
BUN: 16.9 mg/dL (ref 7.0–26.0)
CHLORIDE: 104 meq/L (ref 98–109)
CO2: 22 meq/L (ref 22–29)
CREATININE: 0.8 mg/dL (ref 0.7–1.3)
Calcium: 9 mg/dL (ref 8.4–10.4)
EGFR: >90 mL/min/{1.73_m2} (ref >90)
Glucose: 160 mg/dl — ABNORMAL HIGH (ref 70–140)
POTASSIUM: 4.3 meq/L (ref 3.5–5.1)
Sodium: 136 mEq/L (ref 136–145)
Total Bilirubin: 0.74 mg/dL (ref 0.20–1.20)
Total Protein: 7 g/dL (ref 6.4–8.3)

## 2015-08-11 LAB — CBC WITH DIFFERENTIAL/PLATELET
BASO%: 0.3 % (ref 0.0–2.0)
BASOS ABS: 0 10*3/uL (ref 0.0–0.1)
EOS%: 0.1 % (ref 0.0–7.0)
Eosinophils Absolute: 0 10*3/uL (ref 0.0–0.5)
HCT: 29.6 % — ABNORMAL LOW (ref 38.4–49.9)
HGB: 9.6 g/dL — ABNORMAL LOW (ref 13.0–17.1)
LYMPH%: 10.8 % — AB (ref 14.0–49.0)
MCH: 30.5 pg (ref 27.2–33.4)
MCHC: 32.4 g/dL (ref 32.0–36.0)
MCV: 94 fL (ref 79.3–98.0)
MONO#: 1 10*3/uL — ABNORMAL HIGH (ref 0.1–0.9)
MONO%: 9.2 % (ref 0.0–14.0)
NEUT#: 8.8 10*3/uL — ABNORMAL HIGH (ref 1.5–6.5)
NEUT%: 79.6 % — AB (ref 39.0–75.0)
Platelets: 217 10*3/uL (ref 140–400)
RBC: 3.15 10*6/uL — AB (ref 4.20–5.82)
RDW: 18.6 % — ABNORMAL HIGH (ref 11.0–14.6)
WBC: 11 10*3/uL — ABNORMAL HIGH (ref 4.0–10.3)
lymph#: 1.2 10*3/uL (ref 0.9–3.3)

## 2015-08-11 MED ORDER — SODIUM CHLORIDE 0.9 % IJ SOLN
10.0000 mL | INTRAMUSCULAR | Status: DC | PRN
  Administered 2015-08-11: 10 mL via INTRAVENOUS
  Filled 2015-08-11: qty 10

## 2015-08-11 MED ORDER — HEPARIN SOD (PORK) LOCK FLUSH 100 UNIT/ML IV SOLN
500.0000 [IU] | Freq: Once | INTRAVENOUS | Status: AC | PRN
  Administered 2015-08-11: 500 [IU] via INTRAVENOUS
  Filled 2015-08-11: qty 5

## 2015-08-11 NOTE — Patient Instructions (Signed)

## 2015-08-18 ENCOUNTER — Telehealth: Payer: Self-pay | Admitting: *Deleted

## 2015-08-18 ENCOUNTER — Other Ambulatory Visit (HOSPITAL_BASED_OUTPATIENT_CLINIC_OR_DEPARTMENT_OTHER): Payer: Medicare Other

## 2015-08-18 ENCOUNTER — Ambulatory Visit (HOSPITAL_BASED_OUTPATIENT_CLINIC_OR_DEPARTMENT_OTHER): Payer: Medicare Other

## 2015-08-18 DIAGNOSIS — C3491 Malignant neoplasm of unspecified part of right bronchus or lung: Secondary | ICD-10-CM

## 2015-08-18 DIAGNOSIS — Z95828 Presence of other vascular implants and grafts: Secondary | ICD-10-CM

## 2015-08-18 LAB — CBC WITH DIFFERENTIAL/PLATELET
BASO%: 0.6 % (ref 0.0–2.0)
BASOS ABS: 0.1 10*3/uL (ref 0.0–0.1)
EOS%: 0.2 % (ref 0.0–7.0)
Eosinophils Absolute: 0 10*3/uL (ref 0.0–0.5)
HCT: 27.6 % — ABNORMAL LOW (ref 38.4–49.9)
HEMOGLOBIN: 9.1 g/dL — AB (ref 13.0–17.1)
LYMPH#: 1.9 10*3/uL (ref 0.9–3.3)
LYMPH%: 14.8 % (ref 14.0–49.0)
MCH: 30.9 pg (ref 27.2–33.4)
MCHC: 32.8 g/dL (ref 32.0–36.0)
MCV: 94.3 fL (ref 79.3–98.0)
MONO#: 1.3 10*3/uL — AB (ref 0.1–0.9)
MONO%: 10 % (ref 0.0–14.0)
NEUT#: 9.5 10*3/uL — ABNORMAL HIGH (ref 1.5–6.5)
NEUT%: 74.4 % (ref 39.0–75.0)
Platelets: 103 10*3/uL — ABNORMAL LOW (ref 140–400)
RBC: 2.93 10*6/uL — ABNORMAL LOW (ref 4.20–5.82)
RDW: 18.8 % — AB (ref 11.0–14.6)
WBC: 12.7 10*3/uL — ABNORMAL HIGH (ref 4.0–10.3)

## 2015-08-18 LAB — COMPREHENSIVE METABOLIC PANEL
ALBUMIN: 3.2 g/dL — AB (ref 3.5–5.0)
ALK PHOS: 233 U/L — AB (ref 40–150)
ALT: 38 U/L (ref 0–55)
ANION GAP: 12 meq/L — AB (ref 3–11)
AST: 29 U/L (ref 5–34)
BUN: 9.5 mg/dL (ref 7.0–26.0)
CALCIUM: 9 mg/dL (ref 8.4–10.4)
CO2: 21 mEq/L — ABNORMAL LOW (ref 22–29)
Chloride: 106 mEq/L (ref 98–109)
Creatinine: 1 mg/dL (ref 0.7–1.3)
EGFR: 83 mL/min/{1.73_m2} — AB (ref >90)
Glucose: 182 mg/dl — ABNORMAL HIGH (ref 70–140)
POTASSIUM: 4 meq/L (ref 3.5–5.1)
Sodium: 138 mEq/L (ref 136–145)
Total Bilirubin: <0.3 mg/dL (ref 0.20–1.20)
Total Protein: 7.2 g/dL (ref 6.4–8.3)

## 2015-08-18 MED ORDER — HEPARIN SOD (PORK) LOCK FLUSH 100 UNIT/ML IV SOLN
500.0000 [IU] | Freq: Once | INTRAVENOUS | Status: AC | PRN
  Administered 2015-08-18: 500 [IU] via INTRAVENOUS
  Filled 2015-08-18: qty 5

## 2015-08-18 MED ORDER — SODIUM CHLORIDE 0.9 % IJ SOLN
10.0000 mL | INTRAMUSCULAR | Status: DC | PRN
  Administered 2015-08-18: 10 mL via INTRAVENOUS
  Filled 2015-08-18: qty 10

## 2015-08-18 NOTE — Telephone Encounter (Signed)
I have scheduled flush appts per patient request. Patient given calendar

## 2015-08-25 ENCOUNTER — Ambulatory Visit (HOSPITAL_BASED_OUTPATIENT_CLINIC_OR_DEPARTMENT_OTHER): Payer: Medicare Other

## 2015-08-25 ENCOUNTER — Other Ambulatory Visit (HOSPITAL_BASED_OUTPATIENT_CLINIC_OR_DEPARTMENT_OTHER): Payer: Medicare Other

## 2015-08-25 ENCOUNTER — Other Ambulatory Visit: Payer: Self-pay | Admitting: Internal Medicine

## 2015-08-25 VITALS — BP 137/73 | HR 75 | Temp 98.6°F | Resp 18

## 2015-08-25 DIAGNOSIS — Z95828 Presence of other vascular implants and grafts: Secondary | ICD-10-CM

## 2015-08-25 DIAGNOSIS — C3491 Malignant neoplasm of unspecified part of right bronchus or lung: Secondary | ICD-10-CM

## 2015-08-25 LAB — CBC WITH DIFFERENTIAL/PLATELET
BASO%: 0.4 % (ref 0.0–2.0)
Basophils Absolute: 0 10*3/uL (ref 0.0–0.1)
EOS%: 0.2 % (ref 0.0–7.0)
Eosinophils Absolute: 0 10*3/uL (ref 0.0–0.5)
HEMATOCRIT: 27.6 % — AB (ref 38.4–49.9)
HEMOGLOBIN: 9 g/dL — AB (ref 13.0–17.1)
LYMPH#: 1.8 10*3/uL (ref 0.9–3.3)
LYMPH%: 14.8 % (ref 14.0–49.0)
MCH: 31.2 pg (ref 27.2–33.4)
MCHC: 32.7 g/dL (ref 32.0–36.0)
MCV: 95.3 fL (ref 79.3–98.0)
MONO#: 1.5 10*3/uL — AB (ref 0.1–0.9)
MONO%: 12.9 % (ref 0.0–14.0)
NEUT#: 8.6 10*3/uL — ABNORMAL HIGH (ref 1.5–6.5)
NEUT%: 71.7 % (ref 39.0–75.0)
Platelets: 316 10*3/uL (ref 140–400)
RBC: 2.9 10*6/uL — ABNORMAL LOW (ref 4.20–5.82)
RDW: 20.9 % — AB (ref 11.0–14.6)
WBC: 11.9 10*3/uL — ABNORMAL HIGH (ref 4.0–10.3)

## 2015-08-25 LAB — COMPREHENSIVE METABOLIC PANEL
ALT: 23 U/L (ref 0–55)
AST: 26 U/L (ref 5–34)
Albumin: 3.2 g/dL — ABNORMAL LOW (ref 3.5–5.0)
Alkaline Phosphatase: 202 U/L — ABNORMAL HIGH (ref 40–150)
Anion Gap: 9 mEq/L (ref 3–11)
BUN: 10.6 mg/dL (ref 7.0–26.0)
CALCIUM: 9.1 mg/dL (ref 8.4–10.4)
CHLORIDE: 108 meq/L (ref 98–109)
CO2: 22 mEq/L (ref 22–29)
CREATININE: 0.9 mg/dL (ref 0.7–1.3)
EGFR: >90 mL/min/{1.73_m2} (ref >90)
GLUCOSE: 141 mg/dL — AB (ref 70–140)
Potassium: 4 mEq/L (ref 3.5–5.1)
SODIUM: 139 meq/L (ref 136–145)
Total Bilirubin: <0.3 mg/dL (ref 0.20–1.20)
Total Protein: 7.3 g/dL (ref 6.4–8.3)

## 2015-08-25 MED ORDER — SODIUM CHLORIDE 0.9 % IJ SOLN
10.0000 mL | INTRAMUSCULAR | Status: DC | PRN
  Administered 2015-08-25: 10 mL via INTRAVENOUS
  Filled 2015-08-25: qty 10

## 2015-08-25 MED ORDER — HEPARIN SOD (PORK) LOCK FLUSH 100 UNIT/ML IV SOLN
500.0000 [IU] | Freq: Once | INTRAVENOUS | Status: AC | PRN
Start: 2015-08-25 — End: 2015-08-25
  Administered 2015-08-25: 500 [IU] via INTRAVENOUS
  Filled 2015-08-25: qty 5

## 2015-08-25 NOTE — Patient Instructions (Signed)

## 2015-08-26 ENCOUNTER — Other Ambulatory Visit: Payer: Self-pay | Admitting: Medical Oncology

## 2015-08-29 ENCOUNTER — Ambulatory Visit (HOSPITAL_COMMUNITY)
Admission: RE | Admit: 2015-08-29 | Discharge: 2015-08-29 | Disposition: A | Discharge status: Home / Self Care | Payer: Medicare Other | Source: Ambulatory Visit | Attending: Internal Medicine | Admitting: Internal Medicine

## 2015-08-29 ENCOUNTER — Encounter (HOSPITAL_COMMUNITY): Payer: Self-pay

## 2015-08-29 DIAGNOSIS — J90 Pleural effusion, not elsewhere classified: Secondary | ICD-10-CM | POA: Insufficient documentation

## 2015-08-29 DIAGNOSIS — C787 Secondary malignant neoplasm of liver and intrahepatic bile duct: Secondary | ICD-10-CM | POA: Insufficient documentation

## 2015-08-29 DIAGNOSIS — C3491 Malignant neoplasm of unspecified part of right bronchus or lung: Secondary | ICD-10-CM | POA: Diagnosis present

## 2015-08-29 MED ORDER — IOPAMIDOL (ISOVUE-300) INJECTION 61%
100.0000 mL | Freq: Once | INTRAVENOUS | Status: AC | PRN
  Administered 2015-08-29: 100 mL via INTRAVENOUS

## 2015-09-01 ENCOUNTER — Encounter: Payer: Self-pay | Admitting: Internal Medicine

## 2015-09-01 ENCOUNTER — Ambulatory Visit: Payer: Medicare Other

## 2015-09-01 ENCOUNTER — Ambulatory Visit (HOSPITAL_BASED_OUTPATIENT_CLINIC_OR_DEPARTMENT_OTHER): Payer: Medicare Other | Admitting: Internal Medicine

## 2015-09-01 ENCOUNTER — Encounter: Payer: Self-pay | Admitting: *Deleted

## 2015-09-01 ENCOUNTER — Other Ambulatory Visit (HOSPITAL_BASED_OUTPATIENT_CLINIC_OR_DEPARTMENT_OTHER): Payer: Medicare Other

## 2015-09-01 VITALS — BP 132/68 | HR 78 | Temp 98.7°F | Resp 18 | Ht 69.0 in | Wt 148.7 lb

## 2015-09-01 DIAGNOSIS — C787 Secondary malignant neoplasm of liver and intrahepatic bile duct: Secondary | ICD-10-CM | POA: Diagnosis not present

## 2015-09-01 DIAGNOSIS — J90 Pleural effusion, not elsewhere classified: Secondary | ICD-10-CM | POA: Diagnosis not present

## 2015-09-01 DIAGNOSIS — C342 Malignant neoplasm of middle lobe, bronchus or lung: Secondary | ICD-10-CM

## 2015-09-01 DIAGNOSIS — C3431 Malignant neoplasm of lower lobe, right bronchus or lung: Secondary | ICD-10-CM | POA: Diagnosis not present

## 2015-09-01 DIAGNOSIS — C786 Secondary malignant neoplasm of retroperitoneum and peritoneum: Secondary | ICD-10-CM

## 2015-09-01 DIAGNOSIS — Z5111 Encounter for antineoplastic chemotherapy: Secondary | ICD-10-CM

## 2015-09-01 DIAGNOSIS — C3491 Malignant neoplasm of unspecified part of right bronchus or lung: Secondary | ICD-10-CM

## 2015-09-01 LAB — COMPREHENSIVE METABOLIC PANEL
ALT: 17 U/L (ref 0–55)
ANION GAP: 9 meq/L (ref 3–11)
AST: 23 U/L (ref 5–34)
Albumin: 3.2 g/dL — ABNORMAL LOW (ref 3.5–5.0)
Alkaline Phosphatase: 172 U/L — ABNORMAL HIGH (ref 40–150)
BUN: 11.9 mg/dL (ref 7.0–26.0)
CHLORIDE: 107 meq/L (ref 98–109)
CO2: 22 meq/L (ref 22–29)
Calcium: 8.8 mg/dL (ref 8.4–10.4)
Creatinine: 0.9 mg/dL (ref 0.7–1.3)
EGFR: 88 mL/min/{1.73_m2} — AB (ref >90)
Glucose: 96 mg/dl (ref 70–140)
Potassium: 4.3 mEq/L (ref 3.5–5.1)
Sodium: 138 mEq/L (ref 136–145)
Total Bilirubin: 0.3 mg/dL (ref 0.20–1.20)
Total Protein: 7.2 g/dL (ref 6.4–8.3)

## 2015-09-01 LAB — CBC WITH DIFFERENTIAL/PLATELET
BASO%: 0.5 % (ref 0.0–2.0)
Basophils Absolute: 0 10*3/uL (ref 0.0–0.1)
EOS ABS: 0 10*3/uL (ref 0.0–0.5)
EOS%: 0.2 % (ref 0.0–7.0)
HCT: 26 % — ABNORMAL LOW (ref 38.4–49.9)
HGB: 8.5 g/dL — ABNORMAL LOW (ref 13.0–17.1)
LYMPH%: 23.7 % (ref 14.0–49.0)
MCH: 31.7 pg (ref 27.2–33.4)
MCHC: 32.7 g/dL (ref 32.0–36.0)
MCV: 97 fL (ref 79.3–98.0)
MONO#: 1.6 10*3/uL — ABNORMAL HIGH (ref 0.1–0.9)
MONO%: 18.6 % — AB (ref 0.0–14.0)
NEUT%: 57 % (ref 39.0–75.0)
NEUTROS ABS: 5 10*3/uL (ref 1.5–6.5)
PLATELETS: 379 10*3/uL (ref 140–400)
RBC: 2.68 10*6/uL — AB (ref 4.20–5.82)
RDW: 22.3 % — ABNORMAL HIGH (ref 11.0–14.6)
WBC: 8.8 10*3/uL (ref 4.0–10.3)
lymph#: 2.1 10*3/uL (ref 0.9–3.3)
nRBC: 0 % (ref 0–0)

## 2015-09-01 NOTE — Progress Notes (Signed)
[place] [phone]   [phone]  OFFICE PROGRESS NOTE  Shane Cowden, MD [place] [address]  DIAGNOSIS: Extensive stage (T3, N3, M1b) small cell lung cancer presented with large right middle and lower lobe mass in addition to extensive mediastinal lymphadenopathy, right pleural effusion as well as extensive liver metastasis and retroperitoneal lymphadenopathy diagnosed in April 2017.  PRIOR THERAPY:  Systemic chemotherapy with reduced dose carboplatin for AUC of 3 on day 1 and etoposide 60 MG/M2 on days 1, 2 and 3. First dose was given at [facility]. Status post 3 cycles. Starting from cycle #3 his carboplatin is for AUC of 5 and etoposide 100 MG/M2.   CURRENT THERAPY: The patient is currently considered for maintenance therapy with ROVA-T on the Abbvie clinical trial.  INTERVAL HISTORY: Shane Vasquez 65 y.o. male returns to the clinic today for follow-up visit accompanied by his wife and brother. The patient is doing very well today with no specific complaints.. He tolerated the last cycle of his treatment with Carboplatin and etoposide fairly well with the standard dose. He denied having any significant chest pain, shortness of breath, cough or hemoptysis. The patient denied having any fever or chills. He has no nausea or vomiting. He has no significant weight loss or night sweats. He had repeat CT scan of the chest, abdomen and pelvis performed recently and he is here for evaluation and discussion of his scan results.  MEDICAL HISTORY: Past Medical History  Diagnosis Date  . CERVICAL RADICULOPATHY, RIGHT 08/12/2009  . DIVERTICULOSIS, COLON 08/15/2006  . GERD 08/15/2006  . HYPERLIPIDEMIA 08/15/2006  . HYPERTENSION 08/15/2006  . INTERNAL HEMORRHOIDS 02/29/2008  . SKIN CANCER, HX OF 08/15/2006  . CAP (community acquired pneumonia)     LLL 07/2014  . Empyema lung ([place])     2016 on left    ALLERGIES:   is allergic to bee venom.  MEDICATIONS:  Current Outpatient Prescriptions  Medication Sig Dispense Refill  . acetaminophen (TYLENOL) 500 MG tablet Take 1,000 mg by mouth every 8 (eight) hours as needed.    . citalopram (CELEXA) 40 MG tablet Take 0.5 tablets (20 mg total) by mouth daily.    . diphenhydrAMINE (SOMINEX) 25 MG tablet Take 25 mg by mouth at bedtime as needed for sleep. Reported on 08/04/2015    . EPINEPHrine 0.3 mg/0.3 mL IJ SOAJ injection Inject 0.3 mLs (0.3 mg total) into the muscle once. (Patient not taking: Reported on 08/04/2015) 1 Device 2  . lidocaine-prilocaine (EMLA) cream Apply to port -a-cath 1-2 hours prior to access. 30 g 2  . oxyCODONE (ROXICODONE) 5 MG immediate release tablet TAKE 1-2 TABLETS BY MOUTH EVERY 4-6 HOURS AS NEEDED FOR SEVERE PAIN (Patient not taking: Reported on 08/04/2015) 20 tablet 0  . polyethylene glycol (MIRALAX / GLYCOLAX) packet Take 17 g by mouth daily as needed for mild constipation.    . prochlorperazine (COMPAZINE) 10 MG tablet Take 1 tablet (10 mg total) by mouth every 6 (six) hours as needed for nausea or vomiting. (Patient not taking: Reported on 08/04/2015) 30 tablet 0  . senna (SENOKOT) 8.6 MG TABS tablet Take 1 tablet (8.6 mg total) by mouth daily. (Patient not taking: Reported on 08/04/2015)  0   No current facility-administered medications for this visit.    SURGICAL HISTORY:  Past Surgical History  Procedure Laterality Date  . Tonsillectomy    . Video bronchoscopy N/A 07/27/2014  Procedure: VIDEO BRONCHOSCOPY;  Surgeon: [name], MD;  Location: [facility] OR;  Service: Thoracic;  Laterality: N/A;  . Video assisted thoracoscopy (vats)/empyema Left 07/27/2014    Procedure: VIDEO ASSISTED THORACOSCOPY (VATS)/EMPYEMA Left, drainage pleural effusion.;  Surgeon: [name], MD;  Location: [place];  Service: Thoracic;  Laterality: Left;  . Video bronchoscopy Bilateral 05/20/2015    Procedure: VIDEO BRONCHOSCOPY WITH FLUORO;  Surgeon:  Collene Gobble, MD;  Location: [place];  Service: Cardiopulmonary;  Laterality: Bilateral;    REVIEW OF SYSTEMS:  Constitutional: positive for fatigue Eyes: negative Ears, nose, mouth, throat, and face: negative Respiratory: negative Cardiovascular: negative Gastrointestinal: negative Genitourinary:negative Integument/breast: negative Hematologic/lymphatic: negative Musculoskeletal:negative Neurological: negative Behavioral/Psych: negative Endocrine: negative Allergic/Immunologic: negative   PHYSICAL EXAMINATION: General appearance: alert, cooperative, fatigued and no distress Head: Normocephalic, without obvious abnormality, atraumatic Neck: no adenopathy, no JVD, supple, symmetrical, trachea midline and thyroid not enlarged, symmetric, no tenderness/mass/nodules Lymph nodes: Cervical, supraclavicular, and axillary nodes normal. Resp: clear to auscultation bilaterally Back: symmetric, no curvature. ROM normal. No CVA tenderness. Cardio: regular rate and rhythm, S1, S2 normal, no murmur, click, rub or gallop GI: soft, non-tender; bowel sounds normal; no masses,  no organomegaly Extremities: extremities normal, atraumatic, no cyanosis or edema Neurologic: Alert and oriented X 3, normal strength and tone. Normal symmetric reflexes. Normal coordination and gait  ECOG PERFORMANCE STATUS: 1 - Symptomatic but completely ambulatory  Blood pressure 132/68, pulse 78, temperature 98.7 F (37.1 C), temperature source Oral, resp. rate 18, height '5\' 9"'$  (1.753 m), weight 148 lb 11.2 oz (67.45 kg), SpO2 100 %.  LABORATORY DATA: Lab Results  Component Value Date   WBC 8.8 09/01/2015   HGB 8.5* 09/01/2015   HCT 26.0* 09/01/2015   MCV 97.0 09/01/2015   PLT 379 09/01/2015      Chemistry      Component Value Date/Time   NA 138 09/01/2015 1251   NA 137 06/03/2015 0332   K 4.3 09/01/2015 1251   K 4.3 06/03/2015 0332   CL 108 06/03/2015 0332   CO2 22 09/01/2015 1251   CO2 18*  06/03/2015 0332   BUN 11.9 09/01/2015 1251   BUN 89* 06/03/2015 0332   CREATININE 0.9 09/01/2015 1251   CREATININE 1.29* 06/03/2015 0332      Component Value Date/Time   CALCIUM 8.8 09/01/2015 1251   CALCIUM 8.0* 06/03/2015 0332   ALKPHOS 172* 09/01/2015 1251   ALKPHOS 757* 06/02/2015 0334   AST 23 09/01/2015 1251   AST 585* 06/02/2015 0334   ALT 17 09/01/2015 1251   ALT 121* 06/02/2015 0334   BILITOT 0.30 09/01/2015 1251   BILITOT 4.5* 06/02/2015 0334       RADIOGRAPHIC STUDIES: Ct Chest W Contrast  08/29/2015  CLINICAL DATA:  Rt lung ca dx 05/2015, No surgery,  Chemo in process EXAM: CT CHEST, ABDOMEN, AND PELVIS WITH CONTRAST TECHNIQUE: Multidetector CT imaging of the chest, abdomen and pelvis was performed following the standard protocol during bolus administration of intravenous contrast. CONTRAST:  149m ISOVUE-300 IOPAMIDOL (ISOVUE-300) INJECTION 61% COMPARISON:  07/11/2015 FINDINGS: CT CHEST FINDINGS Mediastinum/Nodes: No axillary or supraclavicular adenopathy. RIGHT lower paratracheal lymph node measures 11 mm short axis decreased from 17 mm. There is perihilar thickening on the RIGHT along the bronchus intermedius and infrahilar peribronchial tissue. The peribronchial thickening is improved compared to prior. For example subcarinal thickening adjacent the bronchus intermedius measures 11 mm decreased from 22 mm. Lungs/Pleura: Consolidation in the RIGHT middle lobe with atelectasis is improved  compared to prior. Band of consolidative atelectasis previously measured 5.2 cm and now measures 2.5 cm. Peripheral nodularity in the lateral aspect of the RIGHT lower lobe (image 35, series 5) which is not clearly seen on prior but may have been obscured by atelectasis and effusion. There is a moderate RIGHT effusion which is increased from large effusion on prior Musculoskeletal: No aggressive osseous lesion. CT ABDOMEN AND PELVIS FINDINGS Hepatobiliary: Multiple hypodense lesions within liver  again demonstrated. Example lesion in the central LEFT hepatic lobe measures 15 mm compared to 14 mm (image 52, series 2). Lesions are slightly less conspicuous than comparison exam due to phase of contrast likely. No new or progressive lesions. Gallbladder normal. Pancreas: Pancreas is normal. No ductal dilatation. No pancreatic inflammation. Spleen: Normal spleen Adrenals/urinary tract: Adrenal glands and kidneys are normal. The ureters and bladder normal. Stomach/Bowel: Stomach, small bowel, appendix, and cecum are normal. Diverticula descending colon sigmoid colon. Rectum normal Vascular/Lymphatic: Abdominal or is normal caliber. Hazy lymph node in the gastrohepatic ligament measures 13 mm and increased from 8 mm (image 51, series 2. Additional adenopathy is identified. Lymph node posterior to IVC measures 7 mm decreased from 11 mm. Reproductive: Prostate normal. Other: No free fluid. Musculoskeletal: No aggressive osseous lesion. IMPRESSION: Chest Impression: 1. Interval decrease in size of mediastinal lymph nodes and RIGHT hilar peribronchial thickening. 2. Interval decrease in volume of atelectasis involving the central RIGHT middle lobe and RIGHT lower lobe. 3. New peripheral nodularity in the RIGHT lower lobe is not clearly seen on prior but may have been obscured by atelectasis and effusion. Recommend attention on follow-up. 4. Decreased RIGHT pleural effusion. Abdomen / Pelvis Impression: 1. Widespread hepatic metastasis are not changed  in the interval. 2. Interval enlargement of single gastrohepatic ligament lymph node decrease in size of paracaval lymph node. Electronically Signed   By: Suzy Bouchard M.D.   On: 08/29/2015 14:54   Ct Abdomen Pelvis W Contrast  08/29/2015  CLINICAL DATA:  Rt lung ca dx 05/2015, No surgery,  Chemo in process EXAM: CT CHEST, ABDOMEN, AND PELVIS WITH CONTRAST TECHNIQUE: Multidetector CT imaging of the chest, abdomen and pelvis was performed following the standard  protocol during bolus administration of intravenous contrast. CONTRAST:  166m ISOVUE-300 IOPAMIDOL (ISOVUE-300) INJECTION 61% COMPARISON:  07/11/2015 FINDINGS: CT CHEST FINDINGS Mediastinum/Nodes: No axillary or supraclavicular adenopathy. RIGHT lower paratracheal lymph node measures 11 mm short axis decreased from 17 mm. There is perihilar thickening on the RIGHT along the bronchus intermedius and infrahilar peribronchial tissue. The peribronchial thickening is improved compared to prior. For example subcarinal thickening adjacent the bronchus intermedius measures 11 mm decreased from 22 mm. Lungs/Pleura: Consolidation in the RIGHT middle lobe with atelectasis is improved compared to prior. Band of consolidative atelectasis previously measured 5.2 cm and now measures 2.5 cm. Peripheral nodularity in the lateral aspect of the RIGHT lower lobe (image 35, series 5) which is not clearly seen on prior but may have been obscured by atelectasis and effusion. There is a moderate RIGHT effusion which is increased from large effusion on prior Musculoskeletal: No aggressive osseous lesion. CT ABDOMEN AND PELVIS FINDINGS Hepatobiliary: Multiple hypodense lesions within liver again demonstrated. Example lesion in the central LEFT hepatic lobe measures 15 mm compared to 14 mm (image 52, series 2). Lesions are slightly less conspicuous than comparison exam due to phase of contrast likely. No new or progressive lesions. Gallbladder normal. Pancreas: Pancreas is normal. No ductal dilatation. No pancreatic inflammation. Spleen: Normal spleen Adrenals/urinary tract:  Adrenal glands and kidneys are normal. The ureters and bladder normal. Stomach/Bowel: Stomach, small bowel, appendix, and cecum are normal. Diverticula descending colon sigmoid colon. Rectum normal Vascular/Lymphatic: Abdominal or is normal caliber. Hazy lymph node in the gastrohepatic ligament measures 13 mm and increased from 8 mm (image 51, series 2. Additional  adenopathy is identified. Lymph node posterior to IVC measures 7 mm decreased from 11 mm. Reproductive: Prostate normal. Other: No free fluid. Musculoskeletal: No aggressive osseous lesion. IMPRESSION: Chest Impression: 1. Interval decrease in size of mediastinal lymph nodes and RIGHT hilar peribronchial thickening. 2. Interval decrease in volume of atelectasis involving the central RIGHT middle lobe and RIGHT lower lobe. 3. New peripheral nodularity in the RIGHT lower lobe is not clearly seen on prior but may have been obscured by atelectasis and effusion. Recommend attention on follow-up. 4. Decreased RIGHT pleural effusion. Abdomen / Pelvis Impression: 1. Widespread hepatic metastasis are not changed  in the interval. 2. Interval enlargement of single gastrohepatic ligament lymph node decrease in size of paracaval lymph node. Electronically Signed   By: Suzy Bouchard M.D.   On: 08/29/2015 14:54    ASSESSMENT AND PLAN: This is a very pleasant 65 years old white male recently diagnosed with extensive stage small cell lung cancer status post 4 cycles of chemotherapy with carboplatin and etoposide. The patient is tolerating his treatment fairly well with no significant adverse effects.  The recent CT scan of the chest, abdomen and pelvis showed further improvement of his disease. I discussed the scan results with the patient and his family. I gave him the option of continuing with systemic chemotherapy with 2 more cycles of carboplatin and etoposide versus consideration of enrollment in the maintenance clinical trial with ROVA-T. After a lengthy discussion of the pros and cons of each option, the patient and his family were interested in proceeding with the enrollment into clinical trial. I discussed with him consideration of prophylactic cranial irradiation before enrollment in the trial. He is interested in proceeding with the prophylactic cranial irradiation and I will refer the patient to radiation  oncology for this option. He was seen by the clinical research nurse for evaluation and more discussion of the study protocol. I would see him for follow-up visit according to the study protocol at this point. He was advised to call immediately if he has any concerning symptoms in the interval. The patient voices understanding of current disease status and treatment options and is in agreement with the current care plan.  All questions were answered. The patient knows to call the clinic with any problems, questions or concerns. We can certainly see the patient much sooner if necessary.  Disclaimer: This note was dictated with voice recognition software. Similar sounding words can inadvertently be transcribed and may not be corrected upon review.

## 2015-09-02 ENCOUNTER — Ambulatory Visit: Payer: Medicare Other

## 2015-09-02 ENCOUNTER — Other Ambulatory Visit: Payer: Self-pay | Admitting: Medical Oncology

## 2015-09-03 ENCOUNTER — Ambulatory Visit: Payer: Medicare Other

## 2015-09-03 ENCOUNTER — Encounter: Payer: Self-pay | Admitting: Radiation Oncology

## 2015-09-03 NOTE — Progress Notes (Signed)
Thoracic Location of Tumor / Histology: Extensive stage (T3, N3, M1b) small cell lung cancer presented with large right middle and lower lobe mass in addition to extensive mediastinal lymphadenopathy, right pleural effusion as well as extensive liver metastasis and retroperitoneal lymphadenopathy diagnosed in April 2017.  Patient in need of PCI for clinical trial.  BRONCHIAL BRUSHING, RML ORIFACE (SPECIMEN 2 OF 3, COLLECTED ON 05/20/15): MALIGNANT CELLS CONSISTENT WITH METASTATIC SMALL CELL UNDIFFERENTIATED CARCINOMa  Tobacco/Marijuana/Snuff/ETOH use: former smoker  Past/Anticipated interventions by cardiothoracic surgery, if any: no  Past/Anticipated interventions by medical oncology, if any:  PRIOR THERAPY:  Systemic chemotherapy with reduced dose carboplatin for AUC of 3 on day 1 and etoposide 60 MG/M2 on days 1, 2 and 3. First dose was given at [facility]. Status post 3 cycles. Starting from cycle #3 his carboplatin is for AUC of 5 and etoposide 100 MG/M2. Completed chemotherapy approximately 3 weeks ago.   CURRENT THERAPY: The patient is currently considered for maintenance therapy with ROVA-T on the Abbvie clinical trial.  Signs/Symptoms  Weight changes, if any: reports recently they have placed all focus on  weight gain. Explain they have increased his meat intake and began  supplementing with carnation instant breakfast.  Respiratory complaints, if any: SOB with exertion has resolved. Denies  cough.  Hemoptysis, if any: no  Pain issues, if any:  Reports persistent upper and lower abdominal pain  which he manages with tylenol. Reports this pain is worse at night. Reports  his stool is soft and denies nausea or vomiting.  SAFETY ISSUES:  Prior radiation? no  Pacemaker/ICD? no   Possible current pregnancy?no  Is the patient on methotrexate? no  Current Complaints / other details:  65 year old male. Married for 38 years. Denies headaches or dizziness. Reports blurred  vision related to cataracts. Reports tinnitus for years.

## 2015-09-04 ENCOUNTER — Ambulatory Visit
Admission: RE | Admit: 2015-09-04 | Discharge: 2015-09-04 | Disposition: A | Discharge status: Home / Self Care | Payer: Medicare Other | Source: Ambulatory Visit | Attending: Radiation Oncology | Admitting: Radiation Oncology

## 2015-09-04 ENCOUNTER — Telehealth: Payer: Self-pay | Admitting: *Deleted

## 2015-09-04 ENCOUNTER — Encounter: Payer: Self-pay | Admitting: Radiation Oncology

## 2015-09-04 ENCOUNTER — Telehealth: Payer: Self-pay | Admitting: Medical Oncology

## 2015-09-04 VITALS — BP 131/62 | HR 72 | Temp 98.6°F | Resp 18 | Ht 69.0 in | Wt 149.4 lb

## 2015-09-04 DIAGNOSIS — E785 Hyperlipidemia, unspecified: Secondary | ICD-10-CM | POA: Insufficient documentation

## 2015-09-04 DIAGNOSIS — J869 Pyothorax without fistula: Secondary | ICD-10-CM | POA: Diagnosis not present

## 2015-09-04 DIAGNOSIS — C3491 Malignant neoplasm of unspecified part of right bronchus or lung: Secondary | ICD-10-CM

## 2015-09-04 DIAGNOSIS — Z510 Encounter for antineoplastic radiation therapy: Secondary | ICD-10-CM | POA: Diagnosis present

## 2015-09-04 DIAGNOSIS — I10 Essential (primary) hypertension: Secondary | ICD-10-CM | POA: Diagnosis not present

## 2015-09-04 DIAGNOSIS — Z85828 Personal history of other malignant neoplasm of skin: Secondary | ICD-10-CM | POA: Insufficient documentation

## 2015-09-04 HISTORY — DX: Malignant neoplasm of unspecified part of unspecified bronchus or lung: C34.90

## 2015-09-04 NOTE — Progress Notes (Signed)
See progress note under physician encounter. 

## 2015-09-04 NOTE — Telephone Encounter (Signed)
Pt notified.We got disconnected at end so he may call back. I left a voice message.

## 2015-09-04 NOTE — Telephone Encounter (Signed)
-----   Message from Carola Frost, RN sent at 09/03/2015  4:55 PM EDT ----- Regarding: RE: vaccines - Hi Dr. Julien Nordmann,  The protocol states that routine prophylaxis with vaccines is permitted; however, vaccines used should not contain live micro-organisms.  Thanks, Barnett Applebaum  ----- Message -----    From: Curt Bears, MD    Sent: 09/03/2015   4:38 PM      To: Gennaro Africa, MD, Carola Frost, RN, # Subject: RE: vaccines -                                 Yes he can receive the vaccines unless contraindicated by the study. I will ask Barnett Applebaum to check it.  Thank you ----- Message -----    From: Ardeen Garland, RN    Sent: 09/03/2015   3:27 PM      To: Gennaro Africa, MD, Curt Bears, MD Subject: vaccines -                                     He is due for pneumonia vaccine and tetnus- can he take those?

## 2015-09-04 NOTE — Progress Notes (Signed)
Radiation Oncology         [phone] ________________________________  Initial Outpatient Consultation  Name: Shane Vasquez MRN: [id number]  Date: 09/04/2015  DOB: 03-07-1950  HY:IFOYDXAJOIN,OMVEH Pilar Plate, MD  Marletta Lor, MD   REFERRING PHYSICIAN: Marletta Lor, MD  DIAGNOSIS: The encounter diagnosis was Small cell carcinoma of lung, right ([place]).    ICD-9-CM ICD-10-CM   1. Small cell carcinoma of lung, right (HCC) 162.9 C34.91    65 y.o. gentleman with extensive stage small cell lung cancer of the right middle and right lower lobe  HISTORY OF PRESENT ILLNESS: Shane Vasquez is a 65 y.o. Vasquez seen at the request of Dr. Julien Nordmann for a diagnosis of extensive small cell lung cancer the right upper and right middle lobe. The patient was originally diagnosed in April 2017. The patient mentions in March 2017 that he started complaining of abdominal pain as well as nausea and questionable constipation. He contacted his primary care physician's office for evaluation and he was asked to use MiraLAX for constipation. His symptoms were getting worse and the patient presented to the emergency department on 05/18/2015. Abdominal x-ray showed no evidence of bowel obstruction or ileus. He was complaining of shortness of breath and chest x-ray was performed on 05/18/2015 and it showed new intense ill-defined airspace opacity within the right lower lobe extending to the right hilum. Additionally there was a vague density at the right hilum and within the overlying medial suprahilar region.  CT scan of the chest, abdomen and pelvis without contrast was performed on 05/18/2015 and it showed an ill-defined 7.0 x 9.0 cm mass within the central right middle and lower lobes extending to the right hilum. There was multiple large mediastinal and right hilar lymph nodes including a 3.8 x 3.8 cm right peritracheal node,a  3.4 x 4.6 cm subcarinal node, and a 1.7 cm right hilar node. There was also small  right pleural effusion with pleural nodularity. These findings were compatible with pulmonary malignancy with pleural and lymph node metastasis. CT of the abdomen showed multiple faint hypodense lesions within the liver the largest measured 1.5 cm suspicious for metastatic disease. There was also enlarged retroperitoneal and upper abdominal lymph nodes identified including 1.8 x 2.1 cm right retroperitoneal lymph node.  Ultrasound guided right thoracentesis was performed by interventional radiology, on 05/19/15, with drainage of 830 mL of dark yellow fluid and the final cytology was consistent with small cell undifferentiated carcinoma.  The patient was also seen by Dr. Lamonte Sakai during his admission and on 05/20/2015 he underwent bronchoscopy with biopsies of the right middle lobe mass. The final pathology (Accession: [id number]) was consistent with small cell carcinoma of the lung.  Dr. Lamonte Sakai kindly referred the patient to the multidisciplinary thoracic oncology clinic, on 05/29/15, for further evaluation and recommendation regarding treatment of his condition. Dr. Julien Nordmann discussed the option of palliative care and hospice referral versus consideration of palliative systemic chemotherapy with reduced dose carboplatin and etoposide during the first cycle of his treatment. The patient and his family were interested in proceeding with systemic therapy.  The patient is currently undergoing systemic treatment under the care of Dr. Julien Nordmann with carboplatin and etoposide. Dr. Julien Nordmann discussed continuing with two more cycles vs consideration of enrollment in a maintenance clinical trial with ROVA-T. His family was interested in the clinical trial.  MRI of the brain on 06/03/15 shows metastatic deposits in the clivus and C2 vertebral body, but is negative for metastatic disease to the brain.  CT scans on 07/11/15 and 09/03/15 show a positive response to therapy.  The patient presents today for consideration of  prophylactic cranial irradiation before enrollment in the ROVA-T trial.  PREVIOUS RADIATION THERAPY: No  PAST MEDICAL HISTORY:  Past Medical History  Diagnosis Date  . CERVICAL RADICULOPATHY, RIGHT 08/12/2009  . DIVERTICULOSIS, COLON 08/15/2006  . GERD 08/15/2006  . HYPERLIPIDEMIA 08/15/2006  . HYPERTENSION 08/15/2006  . INTERNAL HEMORRHOIDS 02/29/2008  . SKIN CANCER, HX OF 08/15/2006  . CAP (community acquired pneumonia)     LLL 07/2014  . Empyema lung ([place])     2016 on left  . Lung cancer ([place])     metastatic small cell lung cancer      PAST SURGICAL HISTORY: Past Surgical History  Procedure Laterality Date  . Tonsillectomy    . Video bronchoscopy N/A 07/27/2014    Procedure: VIDEO BRONCHOSCOPY;  Surgeon: [name], MD;  Location: [facility];  Service: Thoracic;  Laterality: N/A;  . Video assisted thoracoscopy (vats)/empyema Left 07/27/2014    Procedure: VIDEO ASSISTED THORACOSCOPY (VATS)/EMPYEMA Left, drainage pleural effusion.;  Surgeon: [name], MD;  Location: [place];  Service: Thoracic;  Laterality: Left;  . Video bronchoscopy Bilateral 05/20/2015    Procedure: VIDEO BRONCHOSCOPY WITH FLUORO;  Surgeon: Collene Gobble, MD;  Location: [place];  Service: Cardiopulmonary;  Laterality: Bilateral;    FAMILY HISTORY:  Family History  Problem Relation Age of Onset  . Osteoporosis Mother   . COPD Father     SOCIAL HISTORY:  Social History   Social History  . Marital Status: Married    Spouse Name: N/A  . Number of Children: N/A  . Years of Education: N/A   Occupational History  . Not on file.   Social History Main Topics  . Smoking status: Former Smoker -- 1.00 packs/day    Types: Cigarettes    Quit date: 08/29/2012  . Smokeless tobacco: Never Used  . Alcohol Use: No  . Drug Use: No  . Sexual Activity: No   Other Topics Concern  . Not on file   Social History Narrative    ALLERGIES: Bee venom  MEDICATIONS:  Current Outpatient Prescriptions    Medication Sig Dispense Refill  . acetaminophen (TYLENOL) 500 MG tablet Take 1,000 mg by mouth every 8 (eight) hours as needed.    . citalopram (CELEXA) 40 MG tablet Take 0.5 tablets (20 mg total) by mouth daily.    Marland Kitchen oxyCODONE (ROXICODONE) 5 MG immediate release tablet TAKE 1-2 TABLETS BY MOUTH EVERY 4-6 HOURS AS NEEDED FOR SEVERE PAIN 20 tablet 0  . polyethylene glycol (MIRALAX / GLYCOLAX) packet Take 17 g by mouth daily as needed for mild constipation.    Marland Kitchen EPINEPHrine 0.3 mg/0.3 mL IJ SOAJ injection Inject 0.3 mLs (0.3 mg total) into the muscle once. (Patient not taking: Reported on 08/04/2015) 1 Device 2  . lidocaine-prilocaine (EMLA) cream Apply to port -a-cath 1-2 hours prior to access. (Patient not taking: Reported on 09/04/2015) 30 g 2  . prochlorperazine (COMPAZINE) 10 MG tablet Take 1 tablet (10 mg total) by mouth every 6 (six) hours as needed for nausea or vomiting. (Patient not taking: Reported on 08/04/2015) 30 tablet 0  . senna (SENOKOT) 8.6 MG TABS tablet Take 1 tablet (8.6 mg total) by mouth daily. (Patient not taking: Reported on 08/04/2015)  0   No current facility-administered medications for this encounter.    REVIEW OF SYSTEMS:  On review of systems, the patient reports that  he is doing well overall. He denies headaches, blurred vision, double vision, dizziness, or episodes of falls. He denies any chest pain, shortness of breath, cough, fevers, chills, night sweats, unintended weight changes. he denies any bowel or bladder disturbances, and denies abdominal pain, nausea or vomiting. He denies any new musculoskeletal or joint aches or pains. A complete review of systems is obtained and is otherwise negative.    PHYSICAL EXAM:  height is '5\' 9"'$  (1.753 m) and weight is 149 lb 6.4 oz (67.767 kg). His oral temperature is 98.6 F (37 C). His blood pressure is 131/62 and his pulse is 72. His respiration is 18 and oxygen saturation is 98%.   Pain Scale 0/10 In general this is a well  appearing Shane Vasquez in no acute distress. He is alert and oriented x4 and appropriate throughout the examination. HEENT reveals that the patient is normocephalic, atraumatic. EOMs are intact. PERRLA. Skin is intact without any evidence of gross lesions. Cardiovascular exam reveals a regular rate and rhythm, no clicks rubs or murmurs are auscultated. Chest is clear to auscultation bilaterally. Lymphatic assessment is performed and does not reveal any adenopathy in the cervical, supraclavicular, axillary, or inguinal chains. Abdomen has active bowel sounds in all quadrants and is intact. The abdomen is soft, non tender, non distended. Lower extremities are negative for pretibial pitting edema, deep calf tenderness, cyanosis or clubbing.  KPS = 100  100 - Normal; no complaints; no evidence of disease. 90   - Able to carry on normal activity; minor signs or symptoms of disease. 80   - Normal activity with effort; some signs or symptoms of disease. 55   - Cares for self; unable to carry on normal activity or to do active work. 60   - Requires occasional assistance, but is able to care for most of his personal needs. 50   - Requires considerable assistance and frequent medical care. 57   - Disabled; requires special care and assistance. 84   - Severely disabled; hospital admission is indicated although death not imminent. 25   - Very sick; hospital admission necessary; active supportive treatment necessary. 10   - Moribund; fatal processes progressing rapidly. 0     - Dead  Karnofsky DA, Abelmann [place], Craver LS and Burchenal JH [phone]) The use of the nitrogen mustards in the palliative treatment of carcinoma: with particular reference to bronchogenic carcinoma Cancer 1 634-56  LABORATORY DATA:  Lab Results  Component Value Date   WBC 8.8 09/01/2015   HGB 8.5* 09/01/2015   HCT 26.0* 09/01/2015   MCV 97.0 09/01/2015   PLT 379 09/01/2015   Lab Results  Component Value Date   NA 138 09/01/2015   K  4.3 09/01/2015   CL 108 06/03/2015   CO2 22 09/01/2015   Lab Results  Component Value Date   ALT 17 09/01/2015   AST 23 09/01/2015   ALKPHOS 172* 09/01/2015   BILITOT 0.30 09/01/2015     RADIOGRAPHY: Ct Chest W Contrast  09/03/2015  ADDENDUM REPORT: 09/03/2015 09:39 ADDENDUM: Lungs/Pleura: Consolidation in the RIGHT middle lobe with atelectasis is improved compared to prior. Band of consolidative atelectasis previously measured 5.2 cm and now measures 2.5 cm. Peripheral nodularity in the lateral aspect of the RIGHT lower lobe (image 35, series 5) which is not clearly seen on prior but may have been obscured by atelectasis and effusion. There is a moderate RIGHT effusion which is decrease from large effusion on prior. Electronically Signed  By: Kerby Moors M.D.   On: 09/03/2015 09:39  09/03/2015  CLINICAL DATA:  Rt lung ca dx 05/2015, No surgery,  Chemo in process EXAM: CT CHEST, ABDOMEN, AND PELVIS WITH CONTRAST TECHNIQUE: Multidetector CT imaging of the chest, abdomen and pelvis was performed following the standard protocol during bolus administration of intravenous contrast. CONTRAST:  177m ISOVUE-300 IOPAMIDOL (ISOVUE-300) INJECTION 61% COMPARISON:  07/11/2015 FINDINGS: CT CHEST FINDINGS Mediastinum/Nodes: No axillary or supraclavicular adenopathy. RIGHT lower paratracheal lymph node measures 11 mm short axis decreased from 17 mm. There is perihilar thickening on the RIGHT along the bronchus intermedius and infrahilar peribronchial tissue. The peribronchial thickening is improved compared to prior. For example subcarinal thickening adjacent the bronchus intermedius measures 11 mm decreased from 22 mm. Lungs/Pleura: Consolidation in the RIGHT middle lobe with atelectasis is improved compared to prior. Band of consolidative atelectasis previously measured 5.2 cm and now measures 2.5 cm. Peripheral nodularity in the lateral aspect of the RIGHT lower lobe (image 35, series 5) which is not clearly  seen on prior but may have been obscured by atelectasis and effusion. There is a moderate RIGHT effusion which is increased from large effusion on prior Musculoskeletal: No aggressive osseous lesion. CT ABDOMEN AND PELVIS FINDINGS Hepatobiliary: Multiple hypodense lesions within liver again demonstrated. Example lesion in the central LEFT hepatic lobe measures 15 mm compared to 14 mm (image 52, series 2). Lesions are slightly less conspicuous than comparison exam due to phase of contrast likely. No new or progressive lesions. Gallbladder normal. Pancreas: Pancreas is normal. No ductal dilatation. No pancreatic inflammation. Spleen: Normal spleen Adrenals/urinary tract: Adrenal glands and kidneys are normal. The ureters and bladder normal. Stomach/Bowel: Stomach, small bowel, appendix, and cecum are normal. Diverticula descending colon sigmoid colon. Rectum normal Vascular/Lymphatic: Abdominal or is normal caliber. Hazy lymph node in the gastrohepatic ligament measures 13 mm and increased from 8 mm (image 51, series 2. Additional adenopathy is identified. Lymph node posterior to IVC measures 7 mm decreased from 11 mm. Reproductive: Prostate normal. Other: No free fluid. Musculoskeletal: No aggressive osseous lesion. IMPRESSION: Chest Impression: 1. Interval decrease in size of mediastinal lymph nodes and RIGHT hilar peribronchial thickening. 2. Interval decrease in volume of atelectasis involving the central RIGHT middle lobe and RIGHT lower lobe. 3. New peripheral nodularity in the RIGHT lower lobe is not clearly seen on prior but may have been obscured by atelectasis and effusion. Recommend attention on follow-up. 4. Decreased RIGHT pleural effusion. Abdomen / Pelvis Impression: 1. Widespread hepatic metastasis are not changed  in the interval. 2. Interval enlargement of single gastrohepatic ligament lymph node decrease in size of paracaval lymph node. Electronically Signed: By: SSuzy BouchardM.D. On: 08/29/2015  14:54   Ct Abdomen Pelvis W Contrast  09/03/2015  ADDENDUM REPORT: 09/03/2015 09:39 ADDENDUM: Lungs/Pleura: Consolidation in the RIGHT middle lobe with atelectasis is improved compared to prior. Band of consolidative atelectasis previously measured 5.2 cm and now measures 2.5 cm. Peripheral nodularity in the lateral aspect of the RIGHT lower lobe (image 35, series 5) which is not clearly seen on prior but may have been obscured by atelectasis and effusion. There is a moderate RIGHT effusion which is decrease from large effusion on prior. Electronically Signed   By: TKerby MoorsM.D.   On: 09/03/2015 09:39  09/03/2015  CLINICAL DATA:  Rt lung ca dx 05/2015, No surgery,  Chemo in process EXAM: CT CHEST, ABDOMEN, AND PELVIS WITH CONTRAST TECHNIQUE: Multidetector CT imaging of the chest, abdomen and  pelvis was performed following the standard protocol during bolus administration of intravenous contrast. CONTRAST:  171m ISOVUE-300 IOPAMIDOL (ISOVUE-300) INJECTION 61% COMPARISON:  07/11/2015 FINDINGS: CT CHEST FINDINGS Mediastinum/Nodes: No axillary or supraclavicular adenopathy. RIGHT lower paratracheal lymph node measures 11 mm short axis decreased from 17 mm. There is perihilar thickening on the RIGHT along the bronchus intermedius and infrahilar peribronchial tissue. The peribronchial thickening is improved compared to prior. For example subcarinal thickening adjacent the bronchus intermedius measures 11 mm decreased from 22 mm. Lungs/Pleura: Consolidation in the RIGHT middle lobe with atelectasis is improved compared to prior. Band of consolidative atelectasis previously measured 5.2 cm and now measures 2.5 cm. Peripheral nodularity in the lateral aspect of the RIGHT lower lobe (image 35, series 5) which is not clearly seen on prior but may have been obscured by atelectasis and effusion. There is a moderate RIGHT effusion which is increased from large effusion on prior Musculoskeletal: No aggressive osseous  lesion. CT ABDOMEN AND PELVIS FINDINGS Hepatobiliary: Multiple hypodense lesions within liver again demonstrated. Example lesion in the central LEFT hepatic lobe measures 15 mm compared to 14 mm (image 52, series 2). Lesions are slightly less conspicuous than comparison exam due to phase of contrast likely. No new or progressive lesions. Gallbladder normal. Pancreas: Pancreas is normal. No ductal dilatation. No pancreatic inflammation. Spleen: Normal spleen Adrenals/urinary tract: Adrenal glands and kidneys are normal. The ureters and bladder normal. Stomach/Bowel: Stomach, small bowel, appendix, and cecum are normal. Diverticula descending colon sigmoid colon. Rectum normal Vascular/Lymphatic: Abdominal or is normal caliber. Hazy lymph node in the gastrohepatic ligament measures 13 mm and increased from 8 mm (image 51, series 2. Additional adenopathy is identified. Lymph node posterior to IVC measures 7 mm decreased from 11 mm. Reproductive: Prostate normal. Other: No free fluid. Musculoskeletal: No aggressive osseous lesion. IMPRESSION: Chest Impression: 1. Interval decrease in size of mediastinal lymph nodes and RIGHT hilar peribronchial thickening. 2. Interval decrease in volume of atelectasis involving the central RIGHT middle lobe and RIGHT lower lobe. 3. New peripheral nodularity in the RIGHT lower lobe is not clearly seen on prior but may have been obscured by atelectasis and effusion. Recommend attention on follow-up. 4. Decreased RIGHT pleural effusion. Abdomen / Pelvis Impression: 1. Widespread hepatic metastasis are not changed  in the interval. 2. Interval enlargement of single gastrohepatic ligament lymph node decrease in size of paracaval lymph node. Electronically Signed: By: SSuzy BouchardM.D. On: 08/29/2015 14:54      IMPRESSION: 65y.o. gentleman with extensive stage small cell lung cancer of the right middle and right lower lobe for consideration of PCI to enroll in an [employer] clinical trial  with ROVA-T.  PLAN: The patient has completed his systemic therapy with carboplatin and etoposide under the care of Dr. MJulien Nordmann He is contemplating enrolling in the [place] trial with ROVA-T offered [place] at [facility] The patient would need prophylactic cranial irradiation to enroll, and is an acceptable candidate to proceed with this. We discussed that whole brain radiotherapy would help provide some reduction of risk for future brain metastases. However, whole brain radiotherapy carries potential risks including hair loss, subacute somnolence, and neurocognitive changes including a possible reduction in short-term memory. Dr. MTammi Klippeldoes recommend proceeding with a baseline MRI scan to rule out any other disease since his last was in April 2017. CT simulation is scheduled next Wednesday if no metastases are noted in the brain.  The above documentation reflects my direct findings during this shared  patient visit. Please see the separate note by Dr. Tammi Klippel on this date for the remainder of the patient's plan of care.   Carola Rhine, PAC  This document serves as a record of services personally performed by Shona Simpson, PA-C and Tyler Pita, MD. It was created on their behalf by Darcus Austin, a trained medical scribe. The creation of this record is based on the scribe's personal observations and the providers' statements to them. This document has been checked and approved by the attending provider.

## 2015-09-04 NOTE — Telephone Encounter (Signed)
CALLED PATIENT TO INFORM OF MRI FOR 09-08-15 - ARRIVAL - 7:45 AM, NR TO TEST, SPOKE WITH PATIENT AND HE IS AWARE OF THIS TEST

## 2015-09-05 ENCOUNTER — Ambulatory Visit: Payer: Medicare Other

## 2015-09-08 ENCOUNTER — Ambulatory Visit (HOSPITAL_COMMUNITY)
Admission: RE | Admit: 2015-09-08 | Discharge: 2015-09-08 | Disposition: A | Discharge status: Home / Self Care | Payer: Medicare Other | Source: Ambulatory Visit | Attending: Radiation Oncology | Admitting: Radiation Oncology

## 2015-09-08 ENCOUNTER — Encounter: Payer: Self-pay | Admitting: *Deleted

## 2015-09-08 DIAGNOSIS — R938 Abnormal findings on diagnostic imaging of other specified body structures: Secondary | ICD-10-CM | POA: Diagnosis not present

## 2015-09-08 DIAGNOSIS — C7951 Secondary malignant neoplasm of bone: Secondary | ICD-10-CM | POA: Diagnosis not present

## 2015-09-08 DIAGNOSIS — C3491 Malignant neoplasm of unspecified part of right bronchus or lung: Secondary | ICD-10-CM | POA: Diagnosis not present

## 2015-09-08 MED ORDER — GADOBENATE DIMEGLUMINE 529 MG/ML IV SOLN
15.0000 mL | Freq: Once | INTRAVENOUS | Status: AC | PRN
  Administered 2015-09-08: 13 mL via INTRAVENOUS

## 2015-09-08 NOTE — Progress Notes (Signed)
09/08/15 at 9:28am - The research nurse took the pt's MRI results to Dr. Julien Nordmann for review.  The radiologist's states "no parenchymal enhancing lesion".  The research nurse then took the MRI results to Dr. Tammi Klippel to review.  He thanked the nurse for showing him the results.  The research nurse will continue to follow the pt's radiation appointments. Brion Aliment RN, BSN, CCRP Clinical Research Nurse 09/08/2015 9:42 AM

## 2015-09-10 ENCOUNTER — Ambulatory Visit: Payer: Medicare Other

## 2015-09-10 ENCOUNTER — Ambulatory Visit: Payer: Medicare Other | Admitting: Radiation Oncology

## 2015-09-10 ENCOUNTER — Ambulatory Visit
Admission: RE | Admit: 2015-09-10 | Discharge: 2015-09-10 | Disposition: A | Discharge status: Home / Self Care | Payer: Medicare Other | Source: Ambulatory Visit | Attending: Radiation Oncology | Admitting: Radiation Oncology

## 2015-09-10 DIAGNOSIS — Z510 Encounter for antineoplastic radiation therapy: Secondary | ICD-10-CM | POA: Diagnosis not present

## 2015-09-10 DIAGNOSIS — C3491 Malignant neoplasm of unspecified part of right bronchus or lung: Secondary | ICD-10-CM

## 2015-09-10 NOTE — Progress Notes (Signed)
  Radiation Oncology         (336) [phone] ________________________________  Name: Shane Vasquez MRN: [id number]  Date: 09/10/2015  DOB: 1950-02-23  SIMULATION AND TREATMENT PLANNING NOTE    ICD-9-CM ICD-10-CM   1. Metastatic lung cancer (metastasis from lung to other site), right (HCC) 162.9 C34.91     DIAGNOSIS:  65 yo man with small cell lung cancer at risk for brain metastases  NARRATIVE:  The patient was brought to the [facility].  Identity was confirmed.  All relevant records and images related to the planned course of therapy were reviewed.  The patient freely provided informed written consent to proceed with treatment after reviewing the details related to the planned course of therapy. The consent form was witnessed and verified by the simulation staff.  Then, the patient was set-up in a stable reproducible  supine position for radiation therapy.  CT images were obtained.  Surface markings were placed.  The CT images were loaded into the planning software.  Then the target and avoidance structures were contoured.  Treatment planning then occurred.  The radiation prescription was entered and confirmed.  Then, I designed and supervised the construction of a total of 3 medically necessary complex treatment devices, including a custom made thermoplastic mask used for immobilization and two complex multileaf collimators to cover the entire intracranial contents, while shielding the eyes and face.  Each [facility] is independently created to account for beam divergence.  The right and left lateral fields will be treated with 6 MV X-rays.  I have requested : Isodose Plan.    PLAN:  The whole brain will be treated to 25 Gy in 10 fractions.  ________________________________  Sheral Apley Tammi Klippel, M.D.

## 2015-09-11 ENCOUNTER — Telehealth: Payer: Self-pay | Admitting: *Deleted

## 2015-09-11 ENCOUNTER — Encounter: Payer: Self-pay | Admitting: *Deleted

## 2015-09-11 ENCOUNTER — Telehealth: Payer: Self-pay | Admitting: Internal Medicine

## 2015-09-11 DIAGNOSIS — C3490 Malignant neoplasm of unspecified part of unspecified bronchus or lung: Secondary | ICD-10-CM

## 2015-09-11 DIAGNOSIS — Z510 Encounter for antineoplastic radiation therapy: Secondary | ICD-10-CM | POA: Diagnosis not present

## 2015-09-11 NOTE — Telephone Encounter (Signed)
Pt called with c/o of cough and wheezing upon inhalation. Pt is unsure if he had these symtoms at last office visit 7/17 with MD. Pt advised he uses claritin which helps but by the middle of the next day ( too early to take another one) he feels it tops working. Pt denied coughing up mucus, fever, n/v, difficulty breathing.  Pt to be evaluated by [facility] Friday  Per pt request. POF to scheduling. Pt to be enrolled in research study upon completion of radiation.

## 2015-09-11 NOTE — Telephone Encounter (Signed)
spoke w/ pt confirmed 7/28 apt times

## 2015-09-12 ENCOUNTER — Other Ambulatory Visit (HOSPITAL_BASED_OUTPATIENT_CLINIC_OR_DEPARTMENT_OTHER): Payer: Medicare Other

## 2015-09-12 ENCOUNTER — Encounter: Payer: Self-pay | Admitting: Nurse Practitioner

## 2015-09-12 ENCOUNTER — Ambulatory Visit (HOSPITAL_COMMUNITY)
Admission: RE | Admit: 2015-09-12 | Discharge: 2015-09-12 | Disposition: A | Discharge status: Home / Self Care | Payer: Medicare Other | Source: Ambulatory Visit | Attending: Nurse Practitioner | Admitting: Nurse Practitioner

## 2015-09-12 ENCOUNTER — Ambulatory Visit (HOSPITAL_BASED_OUTPATIENT_CLINIC_OR_DEPARTMENT_OTHER): Payer: Medicare Other

## 2015-09-12 ENCOUNTER — Ambulatory Visit (HOSPITAL_BASED_OUTPATIENT_CLINIC_OR_DEPARTMENT_OTHER): Payer: Medicare Other | Admitting: Nurse Practitioner

## 2015-09-12 VITALS — BP 142/74 | HR 76 | Temp 98.3°F | Resp 18 | Ht 69.0 in | Wt 148.8 lb

## 2015-09-12 DIAGNOSIS — R918 Other nonspecific abnormal finding of lung field: Secondary | ICD-10-CM | POA: Diagnosis not present

## 2015-09-12 DIAGNOSIS — C3490 Malignant neoplasm of unspecified part of unspecified bronchus or lung: Secondary | ICD-10-CM

## 2015-09-12 DIAGNOSIS — C3491 Malignant neoplasm of unspecified part of right bronchus or lung: Secondary | ICD-10-CM | POA: Diagnosis not present

## 2015-09-12 DIAGNOSIS — C7951 Secondary malignant neoplasm of bone: Secondary | ICD-10-CM | POA: Diagnosis not present

## 2015-09-12 DIAGNOSIS — J90 Pleural effusion, not elsewhere classified: Secondary | ICD-10-CM | POA: Diagnosis not present

## 2015-09-12 DIAGNOSIS — C787 Secondary malignant neoplasm of liver and intrahepatic bile duct: Secondary | ICD-10-CM | POA: Diagnosis not present

## 2015-09-12 DIAGNOSIS — J40 Bronchitis, not specified as acute or chronic: Secondary | ICD-10-CM | POA: Diagnosis not present

## 2015-09-12 DIAGNOSIS — Z95828 Presence of other vascular implants and grafts: Secondary | ICD-10-CM

## 2015-09-12 LAB — CBC WITH DIFFERENTIAL/PLATELET
BASO%: 0.7 % (ref 0.0–2.0)
Basophils Absolute: 0.1 10*3/uL (ref 0.0–0.1)
EOS ABS: 0.2 10*3/uL (ref 0.0–0.5)
EOS%: 1.9 % (ref 0.0–7.0)
HEMATOCRIT: 28.5 % — AB (ref 38.4–49.9)
HEMOGLOBIN: 9.3 g/dL — AB (ref 13.0–17.1)
LYMPH#: 1.8 10*3/uL (ref 0.9–3.3)
LYMPH%: 19.2 % (ref 14.0–49.0)
MCH: 32.5 pg (ref 27.2–33.4)
MCHC: 32.7 g/dL (ref 32.0–36.0)
MCV: 99.2 fL — AB (ref 79.3–98.0)
MONO#: 1.2 10*3/uL — AB (ref 0.1–0.9)
MONO%: 13.1 % (ref 0.0–14.0)
NEUT%: 65.1 % (ref 39.0–75.0)
NEUTROS ABS: 6 10*3/uL (ref 1.5–6.5)
PLATELETS: 374 10*3/uL (ref 140–400)
RBC: 2.87 10*6/uL — ABNORMAL LOW (ref 4.20–5.82)
RDW: 23.3 % — ABNORMAL HIGH (ref 11.0–14.6)
WBC: 9.2 10*3/uL (ref 4.0–10.3)

## 2015-09-12 LAB — COMPREHENSIVE METABOLIC PANEL
ALBUMIN: 3.3 g/dL — AB (ref 3.5–5.0)
ALK PHOS: 202 U/L — AB (ref 40–150)
ALT: 18 U/L (ref 0–55)
ANION GAP: 12 meq/L — AB (ref 3–11)
AST: 28 U/L (ref 5–34)
BILIRUBIN TOTAL: 0.34 mg/dL (ref 0.20–1.20)
BUN: 16.7 mg/dL (ref 7.0–26.0)
CALCIUM: 9.3 mg/dL (ref 8.4–10.4)
CO2: 20 meq/L — AB (ref 22–29)
CREATININE: 1 mg/dL (ref 0.7–1.3)
Chloride: 107 mEq/L (ref 98–109)
EGFR: 82 mL/min/{1.73_m2} — AB (ref >90)
Glucose: 81 mg/dl (ref 70–140)
Potassium: 4 mEq/L (ref 3.5–5.1)
Sodium: 139 mEq/L (ref 136–145)
TOTAL PROTEIN: 7.7 g/dL (ref 6.4–8.3)

## 2015-09-12 MED ORDER — HEPARIN SOD (PORK) LOCK FLUSH 100 UNIT/ML IV SOLN
500.0000 [IU] | Freq: Once | INTRAVENOUS | Status: AC | PRN
  Administered 2015-09-12: 500 [IU] via INTRAVENOUS
  Filled 2015-09-12: qty 5

## 2015-09-12 MED ORDER — AMOXICILLIN-POT CLAVULANATE 875-125 MG PO TABS: 1.0000 | ORAL_TABLET | Freq: Two times a day (BID) | ORAL | 0 refills | Status: DC

## 2015-09-12 MED ORDER — ALBUTEROL SULFATE (2.5 MG/3ML) 0.083% IN NEBU
2.5000 mg | INHALATION_SOLUTION | Freq: Once | RESPIRATORY_TRACT | Status: AC
  Administered 2015-09-12: 2.5 mg via RESPIRATORY_TRACT
  Filled 2015-09-12: qty 3

## 2015-09-12 MED ORDER — ALBUTEROL SULFATE HFA 108 (90 BASE) MCG/ACT IN AERS: 1.0000 | INHALATION_SPRAY | Freq: Four times a day (QID) | RESPIRATORY_TRACT | 2 refills | Status: AC | PRN

## 2015-09-12 MED ORDER — ALBUTEROL SULFATE (2.5 MG/3ML) 0.083% IN NEBU
INHALATION_SOLUTION | RESPIRATORY_TRACT | Status: AC
  Filled 2015-09-12: qty 3

## 2015-09-12 MED ORDER — SODIUM CHLORIDE 0.9 % IJ SOLN
10.0000 mL | INTRAMUSCULAR | Status: DC | PRN
  Administered 2015-09-12: 10 mL via INTRAVENOUS
  Filled 2015-09-12: qty 10

## 2015-09-15 ENCOUNTER — Encounter: Payer: Self-pay | Admitting: Nurse Practitioner

## 2015-09-15 DIAGNOSIS — J40 Bronchitis, not specified as acute or chronic: Secondary | ICD-10-CM | POA: Insufficient documentation

## 2015-09-15 NOTE — Assessment & Plan Note (Signed)
Patient received his cycle 4, day 1 of his carboplatin/etoposide chemotherapy regimen on 08/04/2015.  Following the completion of cycle 4 of his chemotherapy-patient received a Neulasta injection for growth factor support.  Since that time-patient has made the decision to hold any further chemotherapy; into a weight possible enrollment into a clinical trial.  Patient has also made the decision to move forward with prophylactic brain irradiation which is scheduled to start it this coming Wednesday, 09/17/2015.  Patient has no further follow-up appointments with Dr. Julien Nordmann.  This provider will review all with Dr. Julien Nordmann; and confirm the patient.  Next needs to be seen.

## 2015-09-15 NOTE — Progress Notes (Signed)
SYMPTOM MANAGEMENT CLINIC    Chief Complaint: Bronchitis  HPI:  Shane Vasquez 65 y.o. male diagnosed with lung cancer; with both bone and liver metastasis.  Patient is status post chemotherapy; is currently awaiting availability of the clinical trial.  Is scheduled to initiate prophylactic whole brain irradiation next week.  Patient presents to the [facility] today with complaint of a dry cough and some wheezes.  Denies any recent fevers or chills.   Oncology History   Patient presented to ED with worsening abdominal pain.  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 Right lower lobe nodular interstitial thickening,, right upper lobe nodules, right pleural nodules with small right pleural effusion, enlarg     05/19/2015 Procedure    US Thoracentesis IMPRESSION: Successful ultrasound guided right thoracentesis yielding 830 mL of pleural fluid.     05/20/2015 Pathology Results    1. Bronchus, biopsy, Lung Bronchial Brush SMALL CELL CARCINOMA OF THE LUNG 2. 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  Negative for metastatic disease to the brain.       Review of Systems  Constitutional: Positive for malaise/fatigue.  Respiratory: Positive for cough and wheezing. Negative for sputum production and shortness of breath.   All other systems reviewed and are  negative.   Past Medical History:  Diagnosis Date  . CAP (community acquired pneumonia)    LLL 07/2014  . CERVICAL RADICULOPATHY, RIGHT 08/12/2009  . DIVERTICULOSIS, COLON 08/15/2006  . Empyema lung ([place])    2016 on left  . GERD 08/15/2006  . HYPERLIPIDEMIA 08/15/2006  . HYPERTENSION 08/15/2006  . INTERNAL HEMORRHOIDS 02/29/2008  . Lung cancer ([place])    metastatic small cell lung cancer  . SKIN CANCER, HX OF 08/15/2006    Past Surgical History:  Procedure Laterality Date  . TONSILLECTOMY    . VIDEO ASSISTED THORACOSCOPY (VATS)/EMPYEMA Left 07/27/2014   Procedure: VIDEO ASSISTED THORACOSCOPY (VATS)/EMPYEMA Left, drainage pleural effusion.;  Surgeon: [name], MD;  Location: [facility];  Service: Thoracic;  Laterality: Left;  Marland Kitchen VIDEO BRONCHOSCOPY N/A 07/27/2014   Procedure: VIDEO BRONCHOSCOPY;  Surgeon: [name], MD;  Location: [facility] OR;  Service: Thoracic;  Laterality: N/A;  . 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    is allergic to bee venom.    Medication List       Accurate as of 09/12/15 11:59 PM. Always use your most recent med list.          acetaminophen 500 MG tablet Commonly known as:  TYLENOL Take 1,000 mg by mouth  every 8 (eight) hours as needed.   albuterol 108 (90 Base) MCG/ACT inhaler Commonly known as:  PROVENTIL HFA;VENTOLIN HFA Inhale 1-2 puffs into the lungs every 6 (six) hours as needed for wheezing or shortness of breath.   amoxicillin-clavulanate 875-125 MG tablet Commonly known as:  AUGMENTIN Take 1 tablet by mouth 2 (two) times daily.   citalopram 40 MG tablet Commonly known as:  CELEXA Take 0.5 tablets (20 mg total) by mouth daily.   diphenhydramine-acetaminophen 25-500 MG Tabs tablet Commonly known as:  TYLENOL PM Take 1 tablet by mouth at bedtime as needed.   EPINEPHrine 0.3 mg/0.3 mL Soaj injection Commonly known as:  EPI-PEN Inject 0.3 mLs (0.3 mg total) into the muscle once.   lidocaine-prilocaine cream Commonly known as:  EMLA Apply to port -a-cath 1-2 hours prior to access.   oxyCODONE 5 MG immediate release tablet Commonly known as:  ROXICODONE TAKE 1-2 TABLETS BY MOUTH EVERY 4-6 HOURS AS NEEDED FOR SEVERE PAIN   polyethylene glycol packet Commonly known as:  MIRALAX / GLYCOLAX Take 17 g by mouth daily as needed for mild constipation.   prochlorperazine 10 MG tablet Commonly known as:  COMPAZINE Take 1 tablet (10 mg total) by mouth every 6 (six) hours as needed for nausea or vomiting.   senna 8.6 MG Tabs tablet Commonly known as:  SENOKOT Take 1 tablet (8.6 mg total) by mouth daily.        PHYSICAL EXAMINATION  Oncology Vitals 09/12/2015 09/04/2015  Height 175 cm 175 cm  Weight 67.495 kg 67.767 kg  Weight (lbs) 148 lbs 13 oz 149 lbs 6 oz  BMI (kg/m2) 21.97 kg/m2 22.06 kg/m2  Temp 98.3 98.6  Pulse 76 72  Resp 18 18  SpO2 99 98  BSA (m2) 1.81 m2 1.82 m2   BP Readings from Last 2 Encounters:  09/12/15 (!) 142/74  09/04/15 131/62    Physical Exam  Constitutional: He is oriented to person, place, and time and well-developed, well-nourished, and in no distress.  HENT:  Head: Normocephalic and atraumatic.  Mouth/Throat: Oropharynx is clear and  moist.  Eyes: Conjunctivae and EOM are normal. Pupils are equal, round, and reactive to light. Right eye exhibits no discharge. Left eye exhibits no discharge. No scleral icterus.  Neck: Normal range of motion. Neck supple. No JVD present. No tracheal deviation present. No thyromegaly present.  Cardiovascular: Normal rate, regular rhythm, normal heart sounds and intact distal pulses.   Pulmonary/Chest: No stridor. No respiratory distress. He has wheezes. He has no rales. He exhibits no tenderness.  Patient with a dry cough and some mild wheezing.  Wheezing improved after nebulizer treatment.  Abdominal: Soft. Bowel sounds are normal. He exhibits no distension and no mass. There is no tenderness. There is no rebound and no guarding.  Musculoskeletal: Normal range of motion. He exhibits no edema, tenderness or deformity.  Lymphadenopathy:    He has no cervical adenopathy.  Neurological: He is alert and oriented to person, place, and time. Gait normal.  Skin: Skin is warm and dry. No rash noted. No erythema. No pallor.  Psychiatric: Affect normal.  Nursing note  and vitals reviewed.   LABORATORY DATA:. Appointment on 09/12/2015  Component Date Value Ref Range Status  . WBC 09/12/2015 9.2  4.0 - 10.3 10e3/uL Final  . NEUT# 09/12/2015 6.0  1.5 - 6.5 10e3/uL Final  . HGB 09/12/2015 9.3* 13.0 - 17.1 g/dL Final  . HCT 09/12/2015 28.5* 38.4 - 49.9 % Final  . Platelets 09/12/2015 374  140 - 400 10e3/uL Final  . MCV 09/12/2015 99.2* 79.3 - 98.0 fL Final  . MCH 09/12/2015 32.5  27.2 - 33.4 pg Final  . MCHC 09/12/2015 32.7  32.0 - 36.0 g/dL Final  . RBC 09/12/2015 2.87* 4.20 - 5.82 10e6/uL Final  . RDW 09/12/2015 23.3* 11.0 - 14.6 % Final  . lymph# 09/12/2015 1.8  0.9 - 3.3 10e3/uL Final  . MONO# 09/12/2015 1.2* 0.1 - 0.9 10e3/uL Final  . Eosinophils Absolute 09/12/2015 0.2  0.0 - 0.5 10e3/uL Final  . Basophils Absolute 09/12/2015 0.1  0.0 - 0.1 10e3/uL Final  . NEUT% 09/12/2015 65.1  39.0 - 75.0  % Final  . LYMPH% 09/12/2015 19.2  14.0 - 49.0 % Final  . MONO% 09/12/2015 13.1  0.0 - 14.0 % Final  . EOS% 09/12/2015 1.9  0.0 - 7.0 % Final  . BASO% 09/12/2015 0.7  0.0 - 2.0 % Final  . Sodium 09/12/2015 139  136 - 145 mEq/L Final  . Potassium 09/12/2015 4.0  3.5 - 5.1 mEq/L Final  . Chloride 09/12/2015 107  98 - 109 mEq/L Final  . CO2 09/12/2015 20* 22 - 29 mEq/L Final  . Glucose 09/12/2015 81  70 - 140 mg/dl Final  . BUN 09/12/2015 16.7  7.0 - 26.0 mg/dL Final  . Creatinine 09/12/2015 1.0  0.7 - 1.3 mg/dL Final  . Total Bilirubin 09/12/2015 0.34  0.20 - 1.20 mg/dL Final  . Alkaline Phosphatase 09/12/2015 202* 40 - 150 U/L Final  . AST 09/12/2015 28  5 - 34 U/L Final  . ALT 09/12/2015 18  0 - 55 U/L Final  . Total Protein 09/12/2015 7.7  6.4 - 8.3 g/dL Final  . Albumin 09/12/2015 3.3* 3.5 - 5.0 g/dL Final  . Calcium 09/12/2015 9.3  8.4 - 10.4 mg/dL Final  . Anion Gap 09/12/2015 12* 3 - 11 mEq/L Final  . EGFR 09/12/2015 82* >90 ml/min/1.73 m2 Final    RADIOGRAPHIC STUDIES: Dg Chest 2 View  Result Date: 09/12/2015 CLINICAL DATA:  Productive cough for 2 weeks. EXAM: CHEST  2 VIEW COMPARISON:  08/29/2015 and prior chest CTs. 05/29/2015 and prior chest radiographs. FINDINGS: The cardiomediastinal silhouette is unchanged. Right mid lung opacity/atelectasis appears minimally increased from 08/29/2015. Trace bilateral pleural effusions are noted. There is no evidence of pneumothorax or pulmonary edema. A right Port-A-Cath is again identified with tip overlying the lower SVC. IMPRESSION: Question slight increase in right mid lung opacity which may represent increasing atelectasis or new mild superimposed airspace disease/pneumonia. Trace bilateral pleural effusions. Electronically Signed   By: Margarette Canada M.D.   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   Following the completion of cycle 4 of his chemotherapy-patient received a Neulasta injection for growth factor support.  Since that time-patient has made the decision to hold any further chemotherapy; into a weight possible enrollment into a clinical trial.  Patient has also made the decision to move forward with prophylactic brain irradiation which is scheduled to  start it this coming Wednesday, 09/17/2015.  Patient has no further follow-up appointments with Dr. Julien Nordmann.  This provider will review all with Dr. Julien Nordmann; and confirm the patient.  Next needs to be seen.  Bronchitis Patient reports a clear dry/nonproductive cough for the past few days.  He is also noticed some wheezing as well.  He denies any recent fevers or chills.  Exam today reveals mild wheezes; and only a dry cough.  No acute respiratory distress noted.  Patient was afebrile with temperature 98.3.  Chest x-ray obtained today revealed: IMPRESSION: Question slight increase in right mid lung opacity which may represent increasing atelectasis or new mild superimposed airspace disease/pneumonia. Trace bilateral pleural effusions. Electronically Signed   By: Margarette Canada M.D.   On: 09/12/2015 13:53   Patient was given an albuterol nebulizer treatment while at the [facility].  He was also prescribed an albuterol inhaler to use on an as-needed basis as well.  Patient will be prescribed Augmentin antibiotics for treatment of bronchitis/possible pneumonia.  Consider the use of Levaquin antibiotics instead; but patient takes Celexa-in the combination of Levaquin and Celexa can increase the risk of QT prolongation.  Patient was advised to call/return or go directly to the emergency department for any worsening symptoms whatsoever.     Patient stated understanding of all instructions; and was in agreement with this plan of care. The patient knows to call the clinic with any problems, questions or concerns.   Total time spent  with patient was 25 minutes;  with greater than 75 percent of that time spent in face to face counseling regarding patient's symptoms,  and coordination of care and follow up.  Disclaimer:This dictation was prepared with Dragon/digital dictation along with [employer]. Any transcriptional errors that result from this process are unintentional.  Drue Second, NP 09/15/2015

## 2015-09-15 NOTE — Assessment & Plan Note (Signed)
Patient reports a clear dry/nonproductive cough for the past few days.  He is also noticed some wheezing as well.  He denies any recent fevers or chills.  Exam today reveals mild wheezes; and only a dry cough.  No acute respiratory distress noted.  Patient was afebrile with temperature 98.3.  Chest x-ray obtained today revealed: IMPRESSION: Question slight increase in right mid lung opacity which may represent increasing atelectasis or new mild superimposed airspace disease/pneumonia. Trace bilateral pleural effusions. Electronically Signed   By: Margarette Canada M.D.   On: 09/12/2015 13:53   Patient was given an albuterol nebulizer treatment while at the [facility].  He was also prescribed an albuterol inhaler to use on an as-needed basis as well.  Patient will be prescribed Augmentin antibiotics for treatment of bronchitis/possible pneumonia.  Consider the use of Levaquin antibiotics instead; but patient takes Celexa-in the combination of Levaquin and Celexa can increase the risk of QT prolongation.  Patient was advised to call/return or go directly to the emergency department for any worsening symptoms whatsoever.

## 2015-09-16 ENCOUNTER — Telehealth: Payer: Self-pay | Admitting: *Deleted

## 2015-09-16 NOTE — Telephone Encounter (Signed)
TC to patient for follow up for bronchitis. Spoke with patient. He states he is feeling much better, continues on his Augmentin w/o problems.  *Needs f/u appt with Dr. Julien Nordmann.

## 2015-09-17 ENCOUNTER — Ambulatory Visit
Admission: RE | Admit: 2015-09-17 | Discharge: 2015-09-17 | Disposition: A | Discharge status: Home / Self Care | Payer: Medicare Other | Source: Ambulatory Visit | Attending: Radiation Oncology | Admitting: Radiation Oncology

## 2015-09-17 DIAGNOSIS — Z510 Encounter for antineoplastic radiation therapy: Secondary | ICD-10-CM | POA: Diagnosis not present

## 2015-09-17 NOTE — Telephone Encounter (Signed)
Note to [facility] for f/u appt.

## 2015-09-18 ENCOUNTER — Ambulatory Visit (HOSPITAL_COMMUNITY)
Admission: RE | Admit: 2015-09-18 | Discharge: 2015-09-18 | Disposition: A | Discharge status: Home / Self Care | Payer: Medicare Other | Source: Ambulatory Visit | Attending: Nurse Practitioner | Admitting: Nurse Practitioner

## 2015-09-18 ENCOUNTER — Telehealth: Payer: Self-pay | Admitting: *Deleted

## 2015-09-18 ENCOUNTER — Ambulatory Visit (HOSPITAL_BASED_OUTPATIENT_CLINIC_OR_DEPARTMENT_OTHER): Payer: Medicare Other | Admitting: Nurse Practitioner

## 2015-09-18 ENCOUNTER — Telehealth: Payer: Self-pay | Admitting: Nurse Practitioner

## 2015-09-18 ENCOUNTER — Ambulatory Visit
Admission: RE | Admit: 2015-09-18 | Discharge: 2015-09-18 | Disposition: A | Discharge status: Home / Self Care | Payer: Medicare Other | Source: Ambulatory Visit | Attending: Radiation Oncology | Admitting: Radiation Oncology

## 2015-09-18 ENCOUNTER — Encounter (HOSPITAL_COMMUNITY): Payer: Self-pay

## 2015-09-18 ENCOUNTER — Other Ambulatory Visit: Payer: Self-pay | Admitting: Medical Oncology

## 2015-09-18 ENCOUNTER — Telehealth: Payer: Self-pay | Admitting: Medical Oncology

## 2015-09-18 ENCOUNTER — Other Ambulatory Visit (HOSPITAL_BASED_OUTPATIENT_CLINIC_OR_DEPARTMENT_OTHER): Payer: Medicare Other

## 2015-09-18 VITALS — BP 119/58 | HR 83 | Temp 97.9°F | Resp 18 | Ht 69.0 in | Wt 149.2 lb

## 2015-09-18 DIAGNOSIS — J40 Bronchitis, not specified as acute or chronic: Secondary | ICD-10-CM

## 2015-09-18 DIAGNOSIS — C3490 Malignant neoplasm of unspecified part of unspecified bronchus or lung: Secondary | ICD-10-CM

## 2015-09-18 DIAGNOSIS — C3491 Malignant neoplasm of unspecified part of right bronchus or lung: Secondary | ICD-10-CM

## 2015-09-18 DIAGNOSIS — R918 Other nonspecific abnormal finding of lung field: Secondary | ICD-10-CM | POA: Diagnosis not present

## 2015-09-18 DIAGNOSIS — Z510 Encounter for antineoplastic radiation therapy: Secondary | ICD-10-CM | POA: Diagnosis not present

## 2015-09-18 DIAGNOSIS — C787 Secondary malignant neoplasm of liver and intrahepatic bile duct: Secondary | ICD-10-CM

## 2015-09-18 DIAGNOSIS — C7931 Secondary malignant neoplasm of brain: Secondary | ICD-10-CM

## 2015-09-18 DIAGNOSIS — J9811 Atelectasis: Secondary | ICD-10-CM | POA: Insufficient documentation

## 2015-09-18 DIAGNOSIS — D649 Anemia, unspecified: Secondary | ICD-10-CM

## 2015-09-18 DIAGNOSIS — Z95828 Presence of other vascular implants and grafts: Secondary | ICD-10-CM

## 2015-09-18 LAB — COMPREHENSIVE METABOLIC PANEL
ALBUMIN: 3.2 g/dL — AB (ref 3.5–5.0)
ALK PHOS: 213 U/L — AB (ref 40–150)
ALT: 21 U/L (ref 0–55)
AST: 35 U/L — ABNORMAL HIGH (ref 5–34)
Anion Gap: 10 mEq/L (ref 3–11)
BUN: 15.7 mg/dL (ref 7.0–26.0)
CO2: 21 mEq/L — ABNORMAL LOW (ref 22–29)
Calcium: 9.5 mg/dL (ref 8.4–10.4)
Chloride: 107 mEq/L (ref 98–109)
Creatinine: 0.9 mg/dL (ref 0.7–1.3)
EGFR: 84 mL/min/{1.73_m2} — AB (ref >90)
GLUCOSE: 119 mg/dL (ref 70–140)
POTASSIUM: 4.3 meq/L (ref 3.5–5.1)
SODIUM: 138 meq/L (ref 136–145)
Total Bilirubin: 0.36 mg/dL (ref 0.20–1.20)
Total Protein: 7.7 g/dL (ref 6.4–8.3)

## 2015-09-18 LAB — CBC WITH DIFFERENTIAL/PLATELET
BASO%: 0.6 % (ref 0.0–2.0)
Basophils Absolute: 0.1 10*3/uL (ref 0.0–0.1)
EOS%: 1.6 % (ref 0.0–7.0)
Eosinophils Absolute: 0.2 10*3/uL (ref 0.0–0.5)
HCT: 28.9 % — ABNORMAL LOW (ref 38.4–49.9)
HEMOGLOBIN: 9.5 g/dL — AB (ref 13.0–17.1)
LYMPH#: 1.4 10*3/uL (ref 0.9–3.3)
LYMPH%: 12.2 % — ABNORMAL LOW (ref 14.0–49.0)
MCH: 32.4 pg (ref 27.2–33.4)
MCHC: 32.8 g/dL (ref 32.0–36.0)
MCV: 98.9 fL — AB (ref 79.3–98.0)
MONO#: 1.1 10*3/uL — ABNORMAL HIGH (ref 0.1–0.9)
MONO%: 9.8 % (ref 0.0–14.0)
NEUT%: 75.8 % — ABNORMAL HIGH (ref 39.0–75.0)
NEUTROS ABS: 8.6 10*3/uL — AB (ref 1.5–6.5)
Platelets: 345 10*3/uL (ref 140–400)
RBC: 2.92 10*6/uL — AB (ref 4.20–5.82)
RDW: 22.1 % — AB (ref 11.0–14.6)
WBC: 11.4 10*3/uL — AB (ref 4.0–10.3)

## 2015-09-18 MED ORDER — MIRTAZAPINE 15 MG PO TABS: 15.0000 mg | ORAL_TABLET | Freq: Every day | ORAL | 0 refills | Status: DC

## 2015-09-18 MED ORDER — LEVOFLOXACIN 500 MG PO TABS: 500.0000 mg | ORAL_TABLET | Freq: Every day | ORAL | 0 refills | Status: DC

## 2015-09-18 MED ORDER — OXYCODONE HCL 5 MG PO TABS: ORAL_TABLET | ORAL | 0 refills | Status: DC

## 2015-09-18 MED ORDER — IOPAMIDOL (ISOVUE-370) INJECTION 76%
100.0000 mL | Freq: Once | INTRAVENOUS | Status: AC | PRN
  Administered 2015-09-18: 75 mL via INTRAVENOUS

## 2015-09-18 MED ORDER — SODIUM CHLORIDE 0.9 % IJ SOLN
10.0000 mL | INTRAMUSCULAR | Status: DC | PRN
  Administered 2015-09-18: 10 mL via INTRAVENOUS
  Filled 2015-09-18: qty 10

## 2015-09-18 NOTE — Telephone Encounter (Signed)
Pt aware to go to get cxr first

## 2015-09-18 NOTE — Telephone Encounter (Signed)
Spoke w/ cyndees nurse she will do flush, no availability elsewhere

## 2015-09-18 NOTE — Progress Notes (Signed)
  Radiation Oncology         [phone] ________________________________  Name: Shane Vasquez MRN: [id number]  Date: 09/19/2015  DOB: 03-Jun-1950  SIMULATION AND TREATMENT PLANNING NOTE    ICD-9-CM ICD-10-CM   1. Small cell carcinoma of lung, right (HCC) 162.9 C34.91     NARRATIVE:  The patient was brought to the [facility].  Identity was confirmed.  All relevant records and images related to the planned course of therapy were reviewed.  The patient freely provided informed written consent to proceed with treatment after reviewing the details related to the planned course of therapy. The consent form was witnessed and verified by the simulation staff.  Then, the patient was set-up in a stable reproducible  supine position for radiation therapy.  CT images were obtained.  Surface markings were placed.  The CT images were loaded into the planning software.  Then the target and avoidance structures were contoured.  Treatment planning then occurred.  The radiation prescription was entered and confirmed.  Then, I designed and supervised the construction of a total of 6 medically necessary complex treatment devices, including a BodyFix immobilization mold custom fitted to the patient along with 5 multileaf collimators conformally shaped radiation around the treatment target while shielding critical structures such as the heart and spinal cord maximally.  I have requested : 3D Simulation  I have requested a DVH of the following structures: Left lung, right lung, spinal cord, heart, esophagus, and target.  I have ordered:Nutrition Consult   PLAN:  The patient will receive 30 Gy in 10 fractions.  ________________________________  Sheral Apley Tammi Klippel, M.D.

## 2015-09-18 NOTE — Telephone Encounter (Signed)
Pt requests to be seen again for wheezing and sob. He sounds sob on phone. I called pt back and his fever is 100 , using tylenol to keep fever down. He is using inhaler for wheezing. Per Shane Vasquez, pt to be seen in [facility].

## 2015-09-18 NOTE — Patient Instructions (Signed)

## 2015-09-18 NOTE — Telephone Encounter (Signed)
Cyndee Bacon,NP called and patient  will be down for his radiation treatment after  His Ct scan , should be coming to radiation around 3-315pm today, will let Radiation  Therapist know 12:31 PM

## 2015-09-19 ENCOUNTER — Ambulatory Visit
Admission: RE | Admit: 2015-09-19 | Discharge: 2015-09-19 | Disposition: A | Discharge status: Home / Self Care | Payer: Medicare Other | Source: Ambulatory Visit | Attending: Radiation Oncology | Admitting: Radiation Oncology

## 2015-09-19 VITALS — BP 101/62 | HR 85 | Resp 16 | Wt 148.2 lb

## 2015-09-19 DIAGNOSIS — Z510 Encounter for antineoplastic radiation therapy: Secondary | ICD-10-CM | POA: Diagnosis not present

## 2015-09-19 DIAGNOSIS — C3491 Malignant neoplasm of unspecified part of right bronchus or lung: Secondary | ICD-10-CM

## 2015-09-19 DIAGNOSIS — C3490 Malignant neoplasm of unspecified part of unspecified bronchus or lung: Secondary | ICD-10-CM

## 2015-09-19 NOTE — Progress Notes (Addendum)
Weight and vitals stable. Denies pain. Denies headache. Denies nausea or vomiting. Denies dizziness. Reports sleepiness and fatigue. Denies tinnitus or diplopia. Reports a cough that is occasional productive with clear to green sputum. Denies pain or difficulty swallowing. Reports SOB. Scheduled for chest sim following PUT. Denies taking decadron.  BP 101/62   Pulse 85   Resp 16   Wt 148 lb 3.2 oz (67.2 kg)   SpO2 92%   BMI 21.89 kg/m  Wt Readings from Last 3 Encounters:  09/19/15 148 lb 3.2 oz (67.2 kg)  09/18/15 149 lb 3.2 oz (67.7 kg)  09/12/15 148 lb 12.8 oz (67.5 kg)

## 2015-09-19 NOTE — Progress Notes (Signed)
  Radiation Oncology         [phone] ________________________________  Name: Shane Vasquez MRN: [id number]  Date: 09/19/2015  DOB: [birth year]  SIMULATION AND TREATMENT PLANNING NOTE    ICD-9-CM ICD-10-CM   1. Small cell carcinoma of lung, right (HCC) 162.9 C34.91     DIAGNOSIS:  Small cell carcinoma of the right lung.  NARRATIVE:  The patient was brought to the [facility].  Identity was confirmed.  All relevant records and images related to the planned course of therapy were reviewed.  The patient freely provided informed written consent to proceed with treatment after reviewing the details related to the planned course of therapy. The consent form was witnessed and verified by the simulation staff.  Then, the patient was set-up in a stable reproducible  supine position for radiation therapy.  CT images were obtained.  Surface markings were placed.  The CT images were loaded into the planning software.  Then the target and avoidance structures were contoured.  Treatment planning then occurred.  The radiation prescription was entered and confirmed.  Then, I designed and supervised the construction of a total of 4 medically necessary complex treatment devices, including a BodyFix immobilization mold custom fitted to the patient along with 3 multileaf collimators conformally shaped radiation around the treatment target while shielding critical structures such as the heart and spinal cord maximally.  I have requested : 3D Simulation  I have requested a DVH of the following structures: Left lung, right lung, spinal cord, heart, esophagus, and target.  I have ordered:Nutrition Consult  PLAN:  The patient will receive 30 Gy in 10 fractions.  ________________________________  Sheral Apley Tammi Klippel, M.D.    This document serves as a record of services personally performed by Tyler Pita, MD. It was created on his behalf by Lendon Collar, a trained medical scribe. The creation  of this record is based on the scribe's personal observations and the provider's statements to them. This document has been checked and approved by the attending provider.

## 2015-09-19 NOTE — Progress Notes (Signed)
  Radiation Oncology         [phone]   Name: Shane Vasquez MRN: [id number]   Date: 09/19/2015  DOB: 10-29-1950     Weekly Radiation Therapy Management    ICD-9-CM ICD-10-CM   1. Metastatic lung cancer (metastasis from lung to other site), unspecified laterality (HCC) 162.9 C34.90   2. Small cell carcinoma of lung, right (HCC) 162.9 C34.91     Current Dose: 7.5 Gy  Planned Dose:  25 Gy  Narrative The patient presents for routine under treatment assessment.  Weight and vitals stable. Denies pain. Denies headache. Denies nausea or vomiting. Denies dizziness. Reports sleepiness and fatigue. Denies tinnitus or diplopia. Reports a cough that is occasional productive with clear to green sputum. Denies pain or difficulty swallowing. Reports SOB. Scheduled for chest sim following PUT. Denies taking decadron.  The patient is without complaint. Set-up films were reviewed. The chart was checked.  Physical Findings  weight is 148 lb 3.2 oz (67.2 kg). His blood pressure is 101/62 and his pulse is 85. His respiration is 16 and oxygen saturation is 92%. . Weight essentially stable.  No significant changes.  Impression The patient is tolerating radiation.  Plan Continue treatment as planned. He had a CT of the chest yesterday that showed tumor progression in his lung. It showed new and enlarged pulmonary nodules mostly in the right lower lobe and new pleural based masses on the right with significant worsening of hepatic metastatic disease. He has a chest CT sim following this appointment. We will tentatively start chest treatment Monday. This procedure has been fully reviewed with the patient and written informed consent has been obtained.          Sheral Apley Tammi Klippel, M.D.    This document serves as a record of services personally performed by Tyler Pita, MD. It was created on his behalf by Lendon Collar, a trained medical scribe. The creation of this record is based on the scribe's  personal observations and the provider's statements to them. This document has been checked and approved by the attending provider.

## 2015-09-22 ENCOUNTER — Ambulatory Visit
Admission: RE | Admit: 2015-09-22 | Discharge: 2015-09-22 | Disposition: A | Discharge status: Home / Self Care | Payer: Medicare Other | Source: Ambulatory Visit | Attending: Radiation Oncology | Admitting: Radiation Oncology

## 2015-09-22 DIAGNOSIS — Z510 Encounter for antineoplastic radiation therapy: Secondary | ICD-10-CM | POA: Diagnosis not present

## 2015-09-23 ENCOUNTER — Encounter: Payer: Self-pay | Admitting: Nurse Practitioner

## 2015-09-23 ENCOUNTER — Ambulatory Visit
Admission: RE | Admit: 2015-09-23 | Discharge: 2015-09-23 | Disposition: A | Discharge status: Home / Self Care | Payer: Medicare Other | Source: Ambulatory Visit | Attending: Radiation Oncology | Admitting: Radiation Oncology

## 2015-09-23 DIAGNOSIS — Z510 Encounter for antineoplastic radiation therapy: Secondary | ICD-10-CM | POA: Diagnosis not present

## 2015-09-23 NOTE — Assessment & Plan Note (Signed)
Patient received his last carboplatin/etoposide chemotherapy in June 2017.  He had been undergoing observation only; and will be undergoing prophylactic brain irradiation as well.  Patient is currently awaiting possibility of a clinical trial.  Patient presented back to the [facility] today with continued complaint of bronchitis symptoms; with cough, wheezing, and tonic shortness of breath.  He denies any chest pain, chest pressure, or pain with inspiration.  Patient states that he has been taking the Augmentin antibiotics as directed; with no improvement of symptoms.  CT angiogram of the chest obtained today revealed:   IMPRESSION: 1. No evidence of a pulmonary embolus.  No acute finding. 2. There has been significant worsening of lung carcinoma, now with occlusion of the right middle lobe bronchus with right middle lobe atelectasis, occlusions of right lower lobe segmental bronchi, significant increase in size of the right the hilar tumor mass, significant worsening of mediastinal adenopathy, New and enlarged pulmonary nodules, mostly in the right lower lobe, new and pleural based masses on the right, and significant worsening of hepatic metastatic disease.  Dr. Julien Nordmann in to review scan results with both patient and his wife.  Unfortunately, does appear the patient's had progression of his disease.  This will most likely prohibit patient from undergoing the post clinical trial he was hoping for.  Dr. Julien Nordmann has arranged for patient to follow-up with Dr. Tammi Klippel radiation oncology; in hopes of radiation to the chest as well.  Also, patient was instructed to hold any further Augmentin; and to instead start taking the Levaquin antibiotics as directed.  Since Levaquin and Celexa can interact-patient was requested to hold his Celexa; and to take Remeron at night instead.  Patient was also given a refill of his pain medication today as well.  Both patient and his wife stated understanding  of all instructions; and were in agreement with this plan of care.  Patient will need to be scheduled for a follow-up visit with Dr. Julien Nordmann at approximate 3 weeks from today-around Thursday, 10/09/2015.  Patient knows to call in the interim with any new worries or concerns whatsoever.

## 2015-09-23 NOTE — Progress Notes (Signed)
SYMPTOM MANAGEMENT CLINIC    Chief Complaint: Bronchitis symptoms  HPI:  Shane Vasquez 65 y.o. male diagnosed with small cell lung cancer; with both liver and bone metastasis.  Patient is currently undergoing observation only and is awaiting possibility of a clinical trial.  Is scheduled to initiate prophylactic brain irradiation next week as well.  Patient returns back to the [facility] today with complaint of continued bronchitis symptoms; despite taking Augmentin as directed.   Oncology History   Patient presented to ED with worsening abdominal pain.  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 Right lower lobe nodular interstitial thickening,, right upper lobe nodules, right pleural nodules with small right pleural effusion, enlarg     05/19/2015 Procedure    US Thoracentesis IMPRESSION: Successful ultrasound guided right thoracentesis yielding 830 mL of pleural fluid.     05/20/2015 Pathology Results    1. Bronchus, biopsy, Lung Bronchial Brush SMALL CELL CARCINOMA OF THE LUNG 2. 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  Negative for metastatic disease to the brain.       Review of Systems  Constitutional: Positive for chills, fever and malaise/fatigue.  Respiratory: Positive for cough, shortness of breath and wheezing.   All other systems reviewed and are  negative.   Past Medical History:  Diagnosis Date  . CAP (community acquired pneumonia)    LLL 07/2014  . CERVICAL RADICULOPATHY, RIGHT 08/12/2009  . DIVERTICULOSIS, COLON 08/15/2006  . Empyema lung ([place])    2016 on left  . GERD 08/15/2006  . HYPERLIPIDEMIA 08/15/2006  . HYPERTENSION 08/15/2006  . INTERNAL HEMORRHOIDS 02/29/2008  . Lung cancer ([place])    metastatic small cell lung cancer  . SKIN CANCER, HX OF 08/15/2006    Past Surgical History:  Procedure Laterality Date  . TONSILLECTOMY    . VIDEO ASSISTED THORACOSCOPY (VATS)/EMPYEMA Left 07/27/2014   Procedure: VIDEO ASSISTED THORACOSCOPY (VATS)/EMPYEMA Left, drainage pleural effusion.;  Surgeon: [name], MD;  Location: [facility];  Service: Thoracic;  Laterality: Left;  Marland Kitchen VIDEO BRONCHOSCOPY N/A 07/27/2014   Procedure: VIDEO BRONCHOSCOPY;  Surgeon: [name], MD;  Location: [facility] OR;  Service: Thoracic;  Laterality: N/A;  . 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    is allergic to bee venom.    Medication List       Accurate as of 09/18/15 11:59 PM. Always use your most recent med list.          acetaminophen 500 MG tablet Commonly known as:  TYLENOL Take 1,000 mg by mouth every 8 (eight)  hours as needed.   albuterol 108 (90 Base) MCG/ACT inhaler Commonly known as:  PROVENTIL HFA;VENTOLIN HFA Inhale 1-2 puffs into the lungs every 6 (six) hours as needed for wheezing or shortness of breath.   diphenhydramine-acetaminophen 25-500 MG Tabs tablet Commonly known as:  TYLENOL PM Take 1 tablet by mouth at bedtime as needed.   EPINEPHrine 0.3 mg/0.3 mL Soaj injection Commonly known as:  EPI-PEN Inject 0.3 mLs (0.3 mg total) into the muscle once.   levofloxacin 500 MG tablet Commonly known as:  LEVAQUIN Take 1 tablet (500 mg total) by mouth daily.   lidocaine-prilocaine cream Commonly known as:  EMLA Apply to port -a-cath 1-2 hours prior to access.   mirtazapine 15 MG tablet Commonly known as:  REMERON Take 1 tablet (15 mg total) by mouth at bedtime.   oxyCODONE 5 MG immediate release tablet Commonly known as:  ROXICODONE TAKE 1-2 TABLETS BY MOUTH EVERY 4-6 HOURS AS NEEDED FOR SEVERE PAIN   oxymetazoline 0.05 % nasal spray Commonly known as:  AFRIN Place 1 spray into both nostrils 2 (two) times daily.   polyethylene glycol packet Commonly known as:  MIRALAX / GLYCOLAX Take 17 g by mouth daily as needed for mild constipation.   prochlorperazine 10 MG tablet Commonly known as:  COMPAZINE Take 1 tablet (10 mg total) by mouth every 6 (six) hours as needed for nausea or vomiting.   senna 8.6 MG Tabs tablet Commonly known as:  SENOKOT Take 1 tablet (8.6 mg total) by mouth daily.        PHYSICAL EXAMINATION  Oncology Vitals 09/19/2015 09/18/2015  Height - 175 cm  Weight 67.223 kg 67.677 kg  Weight (lbs) 148 lbs 3 oz 149 lbs 3 oz  BMI (kg/m2) 21.89 kg/m2 22.03 kg/m2  Temp - 97.9  Pulse 85 83  Resp 16 18  SpO2 92 95  BSA (m2) 1.81 m2 1.82 m2   BP Readings from Last 2 Encounters:  09/19/15 101/62  09/18/15 (!) 119/58    Physical Exam  Constitutional: He is oriented to person, place, and time. Vital signs are normal. He appears unhealthy.  HENT:  Head:  Normocephalic and atraumatic.  Mouth/Throat: Oropharynx is clear and moist.  Eyes: Conjunctivae and EOM are normal. Pupils are equal, round, and reactive to light. Right eye exhibits no discharge. Left eye exhibits no discharge. No scleral icterus.  Neck: Normal range of motion. Neck supple. No JVD present. No tracheal deviation present. No thyromegaly present.  Cardiovascular: Normal rate, regular rhythm, normal heart sounds and intact distal pulses.   Pulmonary/Chest: Effort normal. No respiratory distress. He has wheezes. He has no rales. He exhibits no tenderness.  Abdominal: Soft. Bowel sounds are normal. He exhibits no distension and no mass. There is no tenderness. There is no rebound and no guarding.  Musculoskeletal: Normal range of motion. He exhibits no edema, tenderness or deformity.  Lymphadenopathy:    He has no cervical adenopathy.  Neurological: He is alert and oriented to person, place, and time. Gait normal.  Skin: Skin is warm and dry. No rash noted. No erythema. No pallor.  Psychiatric: Affect  normal.  Nursing note and vitals reviewed.   LABORATORY DATA:. Appointment on 09/18/2015  Component Date Value Ref Range Status  . WBC 09/18/2015 11.4* 4.0 - 10.3 10e3/uL Final  . NEUT# 09/18/2015 8.6* 1.5 - 6.5 10e3/uL Final  . HGB 09/18/2015 9.5* 13.0 - 17.1 g/dL Final  . HCT 09/18/2015 28.9* 38.4 - 49.9 % Final  . Platelets 09/18/2015 345  140 - 400 10e3/uL Final  . MCV 09/18/2015 98.9* 79.3 - 98.0 fL Final  . MCH 09/18/2015 32.4  27.2 - 33.4 pg Final  . MCHC 09/18/2015 32.8  32.0 - 36.0 g/dL Final  . RBC 09/18/2015 2.92* 4.20 - 5.82 10e6/uL Final  . RDW 09/18/2015 22.1* 11.0 - 14.6 % Final  . lymph# 09/18/2015 1.4  0.9 - 3.3 10e3/uL Final  . MONO# 09/18/2015 1.1* 0.1 - 0.9 10e3/uL Final  . Eosinophils Absolute 09/18/2015 0.2  0.0 - 0.5 10e3/uL Final  . Basophils Absolute 09/18/2015 0.1  0.0 - 0.1 10e3/uL Final  . NEUT% 09/18/2015 75.8* 39.0 - 75.0 % Final  . LYMPH%  09/18/2015 12.2* 14.0 - 49.0 % Final  . MONO% 09/18/2015 9.8  0.0 - 14.0 % Final  . EOS% 09/18/2015 1.6  0.0 - 7.0 % Final  . BASO% 09/18/2015 0.6  0.0 - 2.0 % Final  . Sodium 09/18/2015 138  136 - 145 mEq/L Final  . Potassium 09/18/2015 4.3  3.5 - 5.1 mEq/L Final  . Chloride 09/18/2015 107  98 - 109 mEq/L Final  . CO2 09/18/2015 21* 22 - 29 mEq/L Final  . Glucose 09/18/2015 119  70 - 140 mg/dl Final  . BUN 09/18/2015 15.7  7.0 - 26.0 mg/dL Final  . Creatinine 09/18/2015 0.9  0.7 - 1.3 mg/dL Final  . Total Bilirubin 09/18/2015 0.36  0.20 - 1.20 mg/dL Final  . Alkaline Phosphatase 09/18/2015 213* 40 - 150 U/L Final  . AST 09/18/2015 35* 5 - 34 U/L Final  . ALT 09/18/2015 21  0 - 55 U/L Final  . Total Protein 09/18/2015 7.7  6.4 - 8.3 g/dL Final  . Albumin 09/18/2015 3.2* 3.5 - 5.0 g/dL Final  . Calcium 09/18/2015 9.5  8.4 - 10.4 mg/dL Final  . Anion Gap 09/18/2015 10  3 - 11 mEq/L Final  . EGFR 09/18/2015 84* >90 ml/min/1.73 m2 Final    RADIOGRAPHIC STUDIES: CT Angio of chest:  IMPRESSION: 1. No evidence of a pulmonary embolus.  No acute finding. 2. There has been significant worsening of lung carcinoma, now with occlusion of the right middle lobe bronchus with right middle lobe atelectasis, occlusions of right lower lobe segmental bronchi, significant increase in size of the right the hilar tumor mass, significant worsening of mediastinal adenopathy, New and enlarged pulmonary nodules, mostly in the right lower lobe, new and pleural based masses on the right, and significant worsening of hepatic metastatic disease.  ASSESSMENT/PLAN:    Small cell carcinoma of lung ([place]) Patient received his last carboplatin/etoposide chemotherapy in June 2017.  He had been undergoing observation only; and will be undergoing prophylactic brain irradiation as well.  Patient is currently awaiting possibility of a clinical trial.  Patient presented back to the [facility] today with continued  complaint of bronchitis symptoms; with cough, wheezing, and tonic shortness of breath.  He denies any chest pain, chest pressure, or pain with inspiration.  Patient states that he has been taking the Augmentin antibiotics as directed; with no improvement of symptoms.  CT angiogram of the chest obtained today revealed:  IMPRESSION: 1. No evidence of a pulmonary embolus.  No acute finding. 2. There has been significant worsening of lung carcinoma, now with occlusion of the right middle lobe bronchus with right middle lobe atelectasis, occlusions of right lower lobe segmental bronchi, significant increase in size of the right the hilar tumor mass, significant worsening of mediastinal adenopathy, New and enlarged pulmonary nodules, mostly in the right lower lobe, new and pleural based masses on the right, and significant worsening of hepatic metastatic disease.  Dr. Julien Nordmann in to review scan results with both patient and his wife.  Unfortunately, does appear the patient's had progression of his disease.  This will most likely prohibit patient from undergoing the post clinical trial he was hoping for.  Dr. Julien Nordmann has arranged for patient to follow-up with Dr. Tammi Klippel radiation oncology; in hopes of radiation to the chest as well.  Also, patient was instructed to hold any further Augmentin; and to instead start taking the Levaquin antibiotics as directed.  Since Levaquin and Celexa can interact-patient was requested to hold his Celexa; and to take Remeron at night instead.  Patient was also given a refill of his pain medication today as well.  Both patient and his wife stated understanding of all instructions; and were in agreement with this plan of care.  Patient will need to be scheduled for a follow-up visit with Dr. Julien Nordmann at approximate 3 weeks from today-around Thursday, 10/09/2015.  Patient knows to call in the interim with any new worries or concerns whatsoever.     Patient  stated understanding of all instructions; and was in agreement with this plan of care. The patient knows to call the clinic with any problems, questions or concerns.   Total time spent with patient was 40 minutes;  with greater than 75 percent of that time spent in face to face counseling regarding patient's symptoms,  and coordination of care and follow up.  Disclaimer:This dictation was prepared with Dragon/digital dictation along with [employer]. Any transcriptional errors that result from this process are unintentional.  Drue Second, NP 09/23/2015   ADDENDUM: Hematology/Oncology Attending: I had a face to face encounter with the patient. I recommended his care plan. This is a very pleasant 65 years old white male with extensive stage small cell lung cancer completed 4 cycles of systemic chemotherapy with carboplatin and etoposide with significant improvement of his disease. The patient is currently undergoing prophylactic cranial irradiation in preparation for enrollment in a maintenance clinical trial with ROVA-T. unfortunately he presented today with persistent fever as well as generalized fatigue and weakness as well as cough. He was treated with a short course of antibiotics with no improvement of his symptoms. I'll order a CT angiogram of the chest earlier today and unfortunately it showed significant evidence for disease progression in the chest, mediastinal lymph nodes as well as liver. I had a lengthy discussion with the patient today about his current disease status and treatment options. Unfortunately the patient would not be a good candidate for enrollment in the clinical trial because of his disease progression. I recommended for the patient to complete his prophylactic cranial irradiation as a scheduled. I also ask Dr. Tammi Klippel to consider the patient for palliative radiotherapy to the progressive disease in the lung and mediastinum. For the questionable postobstructive  pneumonia, we will start the patient on Levaquin. I will arrange for the patient to come back for follow-up visit in 2 weeks after completion of his palliative radiotherapy for more detailed discussion  of his treatment options including second line systemic chemotherapy. We changed his antidepressant to Remeron because of the drug drug interaction between Celexa and Levaquin. The patient and his wife agreed to the current plan. He was advised to call immediately if he has any concerning symptoms in the interval.  Disclaimer: This note was dictated with voice recognition software. Similar sounding words can inadvertently be transcribed and may be missed upon review. Eilleen Kempf., MD 09/23/15

## 2015-09-24 ENCOUNTER — Telehealth: Payer: Self-pay | Admitting: Internal Medicine

## 2015-09-24 ENCOUNTER — Telehealth: Payer: Self-pay | Admitting: Medical Oncology

## 2015-09-24 ENCOUNTER — Ambulatory Visit
Admission: RE | Admit: 2015-09-24 | Discharge: 2015-09-24 | Disposition: A | Discharge status: Home / Self Care | Payer: Medicare Other | Source: Ambulatory Visit | Attending: Radiation Oncology | Admitting: Radiation Oncology

## 2015-09-24 DIAGNOSIS — Z510 Encounter for antineoplastic radiation therapy: Secondary | ICD-10-CM | POA: Diagnosis not present

## 2015-09-24 DIAGNOSIS — C3490 Malignant neoplasm of unspecified part of unspecified bronchus or lung: Secondary | ICD-10-CM

## 2015-09-24 DIAGNOSIS — C799 Secondary malignant neoplasm of unspecified site: Secondary | ICD-10-CM

## 2015-09-24 MED ORDER — SONAFINE EX EMUL
1.0000 "application " | Freq: Two times a day (BID) | CUTANEOUS | Status: DC
  Administered 2015-09-24: 1 via TOPICAL

## 2015-09-24 NOTE — Telephone Encounter (Signed)
lvm to inform pt of 8/28 appt date/times per pof

## 2015-09-24 NOTE — Addendum Note (Signed)
Encounter addended by: Heywood Footman, RN on: 09/24/2015  2:25 PM<BR>    Actions taken: Chief Complaint modified, Patient Education assessment filed, Order Reconciliation Section accessed, Home Medications modified

## 2015-09-24 NOTE — Telephone Encounter (Addendum)
Pt calling to report that he has pain x 1 week ago on the left side of neck , like a crick. When he turns his head he feels like it is pulling .  He finished the antibiotic and reports he is better.  He is asking if the pain could be related to his cancer?    He denies swelling in his neck.  He is taking tylenol and oxycodone and tylenol helps better. He is still undergoing xrt.   Note to [place].

## 2015-09-25 ENCOUNTER — Ambulatory Visit
Admission: RE | Admit: 2015-09-25 | Discharge: 2015-09-25 | Disposition: A | Discharge status: Home / Self Care | Payer: Medicare Other | Source: Ambulatory Visit | Attending: Radiation Oncology | Admitting: Radiation Oncology

## 2015-09-25 ENCOUNTER — Telehealth: Payer: Self-pay | Admitting: Medical Oncology

## 2015-09-25 DIAGNOSIS — Z510 Encounter for antineoplastic radiation therapy: Secondary | ICD-10-CM | POA: Diagnosis not present

## 2015-09-25 NOTE — Telephone Encounter (Signed)
F/U on call from yesterday. Last night he was coughing a lot , with wheezing and breathing heavy . He used inhaler and tylenol ES which he eventually got back to sleep. She is wondering if he needs oxygen. Wife stated he will see XRT tomorrow. Note to Dr Johny Shears nurse

## 2015-09-26 ENCOUNTER — Encounter: Payer: Self-pay | Admitting: Radiation Oncology

## 2015-09-26 ENCOUNTER — Ambulatory Visit
Admission: RE | Admit: 2015-09-26 | Discharge: 2015-09-26 | Disposition: A | Discharge status: Home / Self Care | Payer: Medicare Other | Source: Ambulatory Visit | Attending: Radiation Oncology | Admitting: Radiation Oncology

## 2015-09-26 VITALS — BP 129/68 | HR 104 | Resp 20 | Wt 148.9 lb

## 2015-09-26 DIAGNOSIS — C3490 Malignant neoplasm of unspecified part of unspecified bronchus or lung: Secondary | ICD-10-CM

## 2015-09-26 DIAGNOSIS — Z510 Encounter for antineoplastic radiation therapy: Secondary | ICD-10-CM | POA: Diagnosis not present

## 2015-09-26 NOTE — Progress Notes (Signed)
Weight and vitals stable. Denies headache, dizziness, nausea, or vomiting. Reports fatigue. Denies tinnitus or diplopia. Reports for the last two night he has experience episodes of coughing that wake him and cause SOB. Patient reports after using his inhaler, afrin and taking tylenol this calms. Reports he has noticed his SOB is worse. Ambulated with patient about the nursing station twice pulse went from 95% to 93%. Reports his cough is mostly dry but, on occasion productive with clear sputum. Reports he completed his antibiotics on Wednesday for pneumonia. Denies any difficulty or pain associated with swallowing. Denies any skin changes within treatment field. Reports left side constant neck pain x1 week. Reports it feels like he has a crick in his neck. Patient questions if this is related to his cancer.   BP 129/68   Pulse (!) 104   Resp 20   Wt 148 lb 14.4 oz (67.5 kg)   SpO2 95%   BMI 21.99 kg/m  Wt Readings from Last 3 Encounters:  09/26/15 148 lb 14.4 oz (67.5 kg)  09/19/15 148 lb 3.2 oz (67.2 kg)  09/18/15 149 lb 3.2 oz (67.7 kg)

## 2015-09-26 NOTE — Progress Notes (Signed)
Department of Radiation Oncology  Phone:  [phone] Fax:        [phone]  Weekly Treatment Note    Name: Shane Vasquez Date: 09/28/2015 MRN: [id number] DOB: [birth year]   Diagnosis:     ICD-9-CM ICD-10-CM   1. Metastatic lung cancer (metastasis from lung to other site), unspecified laterality (HCC) 162.9 C34.90      Current dose: Brain 20 Gy ; Chest 15 Gy  Current fraction: Brain 8 ; Chest 5    MEDICATIONS: Current Outpatient Prescriptions  Medication Sig Dispense Refill  . acetaminophen (TYLENOL) 500 MG tablet Take 1,000 mg by mouth every 8 (eight) hours as needed.    Marland Kitchen albuterol (PROVENTIL HFA;VENTOLIN HFA) 108 (90 Base) MCG/ACT inhaler Inhale 1-2 puffs into the lungs every 6 (six) hours as needed for wheezing or shortness of breath. 1 Inhaler 2  . diphenhydramine-acetaminophen (TYLENOL PM) 25-500 MG TABS tablet Take 1 tablet by mouth at bedtime as needed.    Marland Kitchen EPINEPHrine 0.3 mg/0.3 mL IJ SOAJ injection Inject 0.3 mLs (0.3 mg total) into the muscle once. 1 Device 2  . lidocaine-prilocaine (EMLA) cream Apply to port -a-cath 1-2 hours prior to access. 30 g 2  . mirtazapine (REMERON) 15 MG tablet Take 1 tablet (15 mg total) by mouth at bedtime. 30 tablet 0  . oxyCODONE (ROXICODONE) 5 MG immediate release tablet TAKE 1-2 TABLETS BY MOUTH EVERY 4-6 HOURS AS NEEDED FOR SEVERE PAIN 30 tablet 0  . oxymetazoline (AFRIN) 0.05 % nasal spray Place 1 spray into both nostrils 2 (two) times daily.    . polyethylene glycol (MIRALAX / GLYCOLAX) packet Take 17 g by mouth daily as needed for mild constipation.    . prochlorperazine (COMPAZINE) 10 MG tablet Take 1 tablet (10 mg total) by mouth every 6 (six) hours as needed for nausea or vomiting. 30 tablet 0  . senna (SENOKOT) 8.6 MG TABS tablet Take 1 tablet (8.6 mg total) by mouth daily.  0  . Wound Dressings (SONAFINE EX) Apply topically.     No current facility-administered medications for this encounter.      ALLERGIES: Bee  venom   LABORATORY DATA:  Lab Results  Component Value Date   WBC 11.4 (H) 09/18/2015   HGB 9.5 (L) 09/18/2015   HCT 28.9 (L) 09/18/2015   MCV 98.9 (H) 09/18/2015   PLT 345 09/18/2015   Lab Results  Component Value Date   NA 138 09/18/2015   K 4.3 09/18/2015   CL 108 06/03/2015   CO2 21 (L) 09/18/2015   Lab Results  Component Value Date   ALT 21 09/18/2015   AST 35 (H) 09/18/2015   ALKPHOS 213 (H) 09/18/2015   BILITOT 0.36 09/18/2015     NARRATIVE: Shane Vasquez was seen today for weekly treatment management. The chart was checked and the patient's films were reviewed.  Weight and vitals stable. Denies headache, dizziness, nausea, or vomiting. Reports fatigue. Denies tinnitus or diplopia. Reports for the last two nights he has experiences episodes of coughing that wake him and cause shortness of breath. Patient reports after using his inhaler, Afrin, and taking Tylenol this calms. Reports he has noticed SOB is worse. Reports his cough is mostly dry but, on occasion productive with clear sputum. Reports he completed his antibiotics on Wednesday for pneumonia. Denies any difficulty or pain associated with swallowing. Denies any skin changes within treatment field. Reports left side constant neck pain, states it feels like he has a crick in his  neck. He questions whether this has something to do with radiation treatment. He denies heartburn. Reports some dryness of the throat.  PHYSICAL EXAMINATION: weight is 148 lb 14.4 oz (67.5 kg). His blood pressure is 129/68 and his pulse is 104 (abnormal). His respiration is 20 and oxygen saturation is 95%.      Alert, in no acute distress.  ASSESSMENT: The patient is doing satisfactorily with treatment.  PLAN: We will continue with the patient's radiation treatment as planned. I will speak with Dr. Tammi Klippel about the patient's left sided crick in neck sensation.     This document serves as a record of services personally performed by Kyung Rudd, MD. It was created on his behalf by Arlyce Harman, a trained medical scribe. The creation of this record is based on the scribe's personal observations and the provider's statements to them. This document has been checked and approved by the attending provider.  ------------------------------------------------  Jodelle Gross, MD, PhD

## 2015-09-29 ENCOUNTER — Ambulatory Visit
Admission: RE | Admit: 2015-09-29 | Discharge: 2015-09-29 | Disposition: A | Discharge status: Home / Self Care | Payer: Medicare Other | Source: Ambulatory Visit | Attending: Radiation Oncology | Admitting: Radiation Oncology

## 2015-09-29 DIAGNOSIS — Z510 Encounter for antineoplastic radiation therapy: Secondary | ICD-10-CM | POA: Diagnosis not present

## 2015-09-30 ENCOUNTER — Ambulatory Visit: Payer: Medicare Other

## 2015-09-30 ENCOUNTER — Ambulatory Visit
Admission: RE | Admit: 2015-09-30 | Discharge: 2015-09-30 | Disposition: A | Discharge status: Home / Self Care | Payer: Medicare Other | Source: Ambulatory Visit | Attending: Radiation Oncology | Admitting: Radiation Oncology

## 2015-09-30 ENCOUNTER — Telehealth: Payer: Self-pay | Admitting: *Deleted

## 2015-09-30 DIAGNOSIS — Z510 Encounter for antineoplastic radiation therapy: Secondary | ICD-10-CM | POA: Diagnosis not present

## 2015-09-30 NOTE — Telephone Encounter (Signed)
TC to patient to f/u on his visit in [facility] for bronchitis on 09/18/15. No answer. Left vm message for patient to call back if he had any questions or concerns.  He has f/u appt with Dr. Julien Nordmann on 10/13/15

## 2015-10-01 ENCOUNTER — Ambulatory Visit
Admission: RE | Admit: 2015-10-01 | Discharge: 2015-10-01 | Disposition: A | Discharge status: Home / Self Care | Payer: Medicare Other | Source: Ambulatory Visit | Attending: Radiation Oncology | Admitting: Radiation Oncology

## 2015-10-01 DIAGNOSIS — Z510 Encounter for antineoplastic radiation therapy: Secondary | ICD-10-CM | POA: Diagnosis not present

## 2015-10-02 ENCOUNTER — Ambulatory Visit
Admission: RE | Admit: 2015-10-02 | Discharge: 2015-10-02 | Disposition: A | Discharge status: Home / Self Care | Payer: Medicare Other | Source: Ambulatory Visit | Attending: Radiation Oncology | Admitting: Radiation Oncology

## 2015-10-02 DIAGNOSIS — Z510 Encounter for antineoplastic radiation therapy: Secondary | ICD-10-CM | POA: Diagnosis not present

## 2015-10-03 ENCOUNTER — Encounter: Payer: Self-pay | Admitting: Radiation Oncology

## 2015-10-03 ENCOUNTER — Ambulatory Visit
Admission: RE | Admit: 2015-10-03 | Discharge: 2015-10-03 | Disposition: A | Discharge status: Home / Self Care | Payer: No Typology Code available for payment source | Source: Ambulatory Visit | Attending: Radiation Oncology | Admitting: Radiation Oncology

## 2015-10-03 ENCOUNTER — Ambulatory Visit
Admission: RE | Admit: 2015-10-03 | Discharge: 2015-10-03 | Disposition: A | Discharge status: Home / Self Care | Payer: Medicare Other | Source: Ambulatory Visit | Attending: Radiation Oncology | Admitting: Radiation Oncology

## 2015-10-03 VITALS — BP 106/74 | HR 94 | Resp 16 | Wt 151.8 lb

## 2015-10-03 DIAGNOSIS — Z510 Encounter for antineoplastic radiation therapy: Secondary | ICD-10-CM | POA: Diagnosis not present

## 2015-10-03 DIAGNOSIS — C3490 Malignant neoplasm of unspecified part of unspecified bronchus or lung: Secondary | ICD-10-CM

## 2015-10-03 NOTE — Progress Notes (Signed)
  Radiation Oncology         [phone]   Name: Shane Vasquez MRN: [id number]   Date: 10/03/2015  DOB: [birth year]     Weekly Radiation Therapy Management    ICD-9-CM ICD-10-CM   1. Small cell carcinoma of lung, unspecified laterality (HCC) 162.9 C34.90     Current Dose: 30 Gy  Planned Dose:  30 Gy  Narrative The patient presents for routine under treatment assessment.  Weight and vitals stable. Reports significant right soulder pain and decreased ROM in right arm. Notes tenderness of the right scapula. Additionally reports latent pain in the center of his chest worse when he coughs or breaths deeply. Denies skin changes within treatment field. Reports tinnitus is unchanged. Denies headache, dizziness, nausea, vomiting, or diplopia. Reports SOB and frequency of dry cough are much less.   Set-up films were reviewed. The chart was checked.  Physical Findings  weight is 151 lb 12.8 oz (68.9 kg). His blood pressure is 106/74 and his pulse is 94. His respiration is 16 and oxygen saturation is 94%. . Weight essentially stable.  No significant changes. Alert, in no acute distress. Tenderness upon palpation of right scapula.  Impression The patient is tolerating radiation.  Plan The patient has completed radiation treatment. The patient was given a one month follow up appointment card today.         Sheral Apley Tammi Klippel, M.D.  This document serves as a record of services personally performed by Tyler Pita, MD. It was created on his behalf by Arlyce Harman, a trained medical scribe. The creation of this record is based on the scribe's personal observations and the provider's statements to them. This document has been checked and approved by the attending provider.

## 2015-10-03 NOTE — Progress Notes (Signed)
Weight and vitals stable. Reports significant right shoulder pain and decreased ROM in right arm. Additionally reports pain in the center of his chest worse when he coughs or breaths deeply. Denies skin changes within treatment field. Reports tinnitus is unchanged. Denies headache, dizziness, nausea, vomiting or diplopia. Reports SOB and frequency of dry cough or much less. One month follow up appointment card given. Patient understands to contact this RN with future needs.   BP 106/74   Pulse 94   Resp 16   Wt 151 lb 12.8 oz (68.9 kg)   SpO2 94%   BMI 22.42 kg/m  Wt Readings from Last 3 Encounters:  10/03/15 151 lb 12.8 oz (68.9 kg)  09/26/15 148 lb 14.4 oz (67.5 kg)  09/19/15 148 lb 3.2 oz (67.2 kg)

## 2015-10-03 NOTE — Progress Notes (Signed)
  Radiation Oncology         [phone] ________________________________  Name: Shane Vasquez MRN: [id number]  Date: 10/03/2015  DOB: 03/02/1950  End of Treatment Note  Diagnosis:     ICD-9-CM ICD-10-CM   1. Small cell carcinoma of lung, right (HCC) 162.9 C34.91    65 y.o. gentleman with extensive stage small cell lung cancer of the right middle and right lower lobe     Indication for treatment:  Curative       Radiation treatment dates:   09/17/2015 to 10/03/2015  Site/dose:    1. The PCI whole brain was treated to 25 Gy in 10 fractions at 2.5 Gy per fraction.  2. The chest was treated to 30 Gy in 10 fractions at 3 Gy per fraction.   Beams/energy:    1. Isodose plan // 6X 2. 3D // 15X  Narrative: The patient tolerated radiation treatment relatively well.   The patient's shortness of breath and frequency of dry cough improved with treatment. The patient complained of significant right shoulder/scapula pain and decreased range of motion in the right arm with treatment. Additionally he reports latent pain in the center of the chest that is worse when he coughs or breathes deeply.  Plan: The patient has completed radiation treatment. The patient will return to radiation oncology clinic for routine followup in one month. I advised him to call or return sooner if he has any questions or concerns related to his recovery or treatment. ________________________________  Sheral Apley. Tammi Klippel, M.D.   This document serves as a record of services personally performed by Tyler Pita, MD. It was created on his behalf by Arlyce Harman, a trained medical scribe. The creation of this record is based on the scribe's personal observations and the provider's statements to them. This document has been checked and approved by the attending provider.

## 2015-10-13 ENCOUNTER — Other Ambulatory Visit (HOSPITAL_BASED_OUTPATIENT_CLINIC_OR_DEPARTMENT_OTHER): Payer: Medicare Other

## 2015-10-13 ENCOUNTER — Telehealth: Payer: Self-pay | Admitting: Internal Medicine

## 2015-10-13 ENCOUNTER — Encounter: Payer: Self-pay | Admitting: Internal Medicine

## 2015-10-13 ENCOUNTER — Ambulatory Visit (HOSPITAL_BASED_OUTPATIENT_CLINIC_OR_DEPARTMENT_OTHER): Payer: Medicare Other | Admitting: Internal Medicine

## 2015-10-13 VITALS — BP 110/58 | HR 102 | Temp 99.8°F | Resp 17 | Wt 147.2 lb

## 2015-10-13 DIAGNOSIS — C786 Secondary malignant neoplasm of retroperitoneum and peritoneum: Secondary | ICD-10-CM | POA: Diagnosis not present

## 2015-10-13 DIAGNOSIS — C3481 Malignant neoplasm of overlapping sites of right bronchus and lung: Secondary | ICD-10-CM | POA: Diagnosis present

## 2015-10-13 DIAGNOSIS — C787 Secondary malignant neoplasm of liver and intrahepatic bile duct: Secondary | ICD-10-CM | POA: Diagnosis not present

## 2015-10-13 DIAGNOSIS — R531 Weakness: Secondary | ICD-10-CM

## 2015-10-13 DIAGNOSIS — R509 Fever, unspecified: Secondary | ICD-10-CM

## 2015-10-13 DIAGNOSIS — J90 Pleural effusion, not elsewhere classified: Secondary | ICD-10-CM

## 2015-10-13 DIAGNOSIS — Z95828 Presence of other vascular implants and grafts: Secondary | ICD-10-CM

## 2015-10-13 DIAGNOSIS — G893 Neoplasm related pain (acute) (chronic): Secondary | ICD-10-CM

## 2015-10-13 DIAGNOSIS — C3491 Malignant neoplasm of unspecified part of right bronchus or lung: Secondary | ICD-10-CM

## 2015-10-13 DIAGNOSIS — Z5111 Encounter for antineoplastic chemotherapy: Secondary | ICD-10-CM

## 2015-10-13 DIAGNOSIS — C3490 Malignant neoplasm of unspecified part of unspecified bronchus or lung: Secondary | ICD-10-CM

## 2015-10-13 LAB — CBC WITH DIFFERENTIAL/PLATELET
BASO%: 0.2 % (ref 0.0–2.0)
Basophils Absolute: 0 10*3/uL (ref 0.0–0.1)
EOS%: 1 % (ref 0.0–7.0)
Eosinophils Absolute: 0.1 10*3/uL (ref 0.0–0.5)
HCT: 28.9 % — ABNORMAL LOW (ref 38.4–49.9)
HGB: 9.2 g/dL — ABNORMAL LOW (ref 13.0–17.1)
LYMPH%: 7.2 % — AB (ref 14.0–49.0)
MCH: 32.6 pg (ref 27.2–33.4)
MCHC: 31.8 g/dL — AB (ref 32.0–36.0)
MCV: 102.5 fL — ABNORMAL HIGH (ref 79.3–98.0)
MONO#: 1.4 10*3/uL — ABNORMAL HIGH (ref 0.1–0.9)
MONO%: 13.4 % (ref 0.0–14.0)
NEUT%: 78.2 % — AB (ref 39.0–75.0)
NEUTROS ABS: 8.4 10*3/uL — AB (ref 1.5–6.5)
Platelets: 157 10*3/uL (ref 140–400)
RBC: 2.82 10*6/uL — AB (ref 4.20–5.82)
RDW: 17.1 % — ABNORMAL HIGH (ref 11.0–14.6)
WBC: 10.7 10*3/uL — AB (ref 4.0–10.3)
lymph#: 0.8 10*3/uL — ABNORMAL LOW (ref 0.9–3.3)
nRBC: 0 % (ref 0–0)

## 2015-10-13 LAB — COMPREHENSIVE METABOLIC PANEL
ALK PHOS: 334 U/L — AB (ref 40–150)
ALT: 19 U/L (ref 0–55)
ANION GAP: 12 meq/L — AB (ref 3–11)
AST: 55 U/L — ABNORMAL HIGH (ref 5–34)
Albumin: 2.5 g/dL — ABNORMAL LOW (ref 3.5–5.0)
BUN: 16.4 mg/dL (ref 7.0–26.0)
CALCIUM: 9.5 mg/dL (ref 8.4–10.4)
CHLORIDE: 105 meq/L (ref 98–109)
CO2: 23 meq/L (ref 22–29)
CREATININE: 1.2 mg/dL (ref 0.7–1.3)
EGFR: 62 mL/min/{1.73_m2} — AB (ref >90)
Glucose: 134 mg/dl (ref 70–140)
POTASSIUM: 4.6 meq/L (ref 3.5–5.1)
Sodium: 140 mEq/L (ref 136–145)
Total Protein: 7.4 g/dL (ref 6.4–8.3)

## 2015-10-13 MED ORDER — DOXYCYCLINE HYCLATE 100 MG PO TABS: 100.0000 mg | ORAL_TABLET | Freq: Two times a day (BID) | ORAL | 0 refills | Status: DC

## 2015-10-13 MED ORDER — OXYCODONE HCL 5 MG PO TABS
ORAL_TABLET | ORAL | 0 refills | Status: DC
Start: 2015-10-13 — End: 2015-10-24

## 2015-10-13 MED ORDER — MIRTAZAPINE 15 MG PO TABS: 15.0000 mg | ORAL_TABLET | Freq: Every day | ORAL | 0 refills | Status: DC

## 2015-10-13 NOTE — Telephone Encounter (Signed)
Gave patient avs report and appointments for September and October. Message to MM re 9/21 f/u.

## 2015-10-13 NOTE — Progress Notes (Signed)
[name] [phone]   [phone]  OFFICE PROGRESS NOTE  Shane Cowden, MD [place] [address]  DIAGNOSIS: Extensive stage (T3, N3, M1b) small cell lung cancer presented with large right middle and lower lobe mass in addition to extensive mediastinal lymphadenopathy, right pleural effusion as well as extensive liver metastasis and retroperitoneal lymphadenopathy diagnosed in April 2017.  PRIOR THERAPY:  1) Systemic chemotherapy with reduced dose carboplatin for AUC of 3 on day 1 and etoposide 60 MG/M2 on days 1, 2 and 3. First dose was given at [facility]. Status post 4 cycles. Starting from cycle #3 his carboplatin is for AUC of 5 and etoposide 100 MG/M2. Last dose of chemotherapy was given on 08/04/2015 discontinued secondary to disease progression. 2) prophylactic cranial irradiation as well as palliative radiotherapy to the chest under the care of Dr. Tammi Klippel completed 10/03/2015.  CURRENT THERAPY: Systemic chemotherapy with cisplatin 30 MG/M2 and irinotecan 65 MG/M2 on days 1 and 8 every 3 weeks.  INTERVAL HISTORY: Shane Vasquez 65 y.o. male returns to the clinic today for follow-up visit accompanied by his wife. The patient is doing very well today but continues to complain of generalized weakness as well as pain on the left side of the chest. He also has intermittent low-grade fever resolved with Tylenol. He completed a course of palliative radiotherapy to the chest as well as prophylactic cranial irradiation under the care of Dr. Tammi Klippel. He denied having any significant chest pain, shortness of breath, cough or hemoptysis. The patient denied having any fever or chills. He has no nausea or vomiting. He has no significant weight loss or night sweats. He is here today for evaluation and discussion of his treatment options.  MEDICAL HISTORY: Past Medical History:  Diagnosis Date  . CAP (community  acquired pneumonia)    LLL 07/2014  . CERVICAL RADICULOPATHY, RIGHT 08/12/2009  . DIVERTICULOSIS, COLON 08/15/2006  . Empyema lung ([place])    2016 on left  . GERD 08/15/2006  . HYPERLIPIDEMIA 08/15/2006  . HYPERTENSION 08/15/2006  . INTERNAL HEMORRHOIDS 02/29/2008  . Lung cancer ([place])    metastatic small cell lung cancer  . SKIN CANCER, HX OF 08/15/2006    ALLERGIES:  is allergic to bee venom.  MEDICATIONS:  Current Outpatient Prescriptions  Medication Sig Dispense Refill  . acetaminophen (TYLENOL) 500 MG tablet Take 1,000 mg by mouth every 8 (eight) hours as needed.    Marland Kitchen albuterol (PROVENTIL HFA;VENTOLIN HFA) 108 (90 Base) MCG/ACT inhaler Inhale 1-2 puffs into the lungs every 6 (six) hours as needed for wheezing or shortness of breath. 1 Inhaler 2  . diphenhydramine-acetaminophen (TYLENOL PM) 25-500 MG TABS tablet Take 1 tablet by mouth at bedtime as needed.    Marland Kitchen EPINEPHrine 0.3 mg/0.3 mL IJ SOAJ injection Inject 0.3 mLs (0.3 mg total) into the muscle once. 1 Device 2  . lidocaine-prilocaine (EMLA) cream Apply to port -a-cath 1-2 hours prior to access. 30 g 2  . mirtazapine (REMERON) 15 MG tablet Take 1 tablet (15 mg total) by mouth at bedtime. 30 tablet 0  . oxyCODONE (ROXICODONE) 5 MG immediate release tablet TAKE 1-2 TABLETS BY MOUTH EVERY 4-6 HOURS AS NEEDED FOR SEVERE PAIN 30 tablet 0  . oxymetazoline (AFRIN) 0.05 % nasal spray Place 1 spray into both nostrils 2 (two) times daily.    . polyethylene glycol (MIRALAX / GLYCOLAX) packet Take 17 g by mouth daily as needed for mild  constipation.    . prochlorperazine (COMPAZINE) 10 MG tablet Take 1 tablet (10 mg total) by mouth every 6 (six) hours as needed for nausea or vomiting. 30 tablet 0  . senna (SENOKOT) 8.6 MG TABS tablet Take 1 tablet (8.6 mg total) by mouth daily.  0  . Wound Dressings (SONAFINE EX) Apply topically.     No current facility-administered medications for this visit.     SURGICAL HISTORY:  Past Surgical History:    Procedure Laterality Date  . TONSILLECTOMY    . VIDEO ASSISTED THORACOSCOPY (VATS)/EMPYEMA Left 07/27/2014   Procedure: VIDEO ASSISTED THORACOSCOPY (VATS)/EMPYEMA Left, drainage pleural effusion.;  Surgeon: [name], MD;  Location: [facility];  Service: Thoracic;  Laterality: Left;  Marland Kitchen VIDEO BRONCHOSCOPY N/A 07/27/2014   Procedure: VIDEO BRONCHOSCOPY;  Surgeon: [name], MD;  Location: [facility] OR;  Service: Thoracic;  Laterality: N/A;  . VIDEO BRONCHOSCOPY Bilateral 05/20/2015   Procedure: VIDEO BRONCHOSCOPY WITH FLUORO;  Surgeon: Collene Gobble, MD;  Location: [facility];  Service: Cardiopulmonary;  Laterality: Bilateral;    REVIEW OF SYSTEMS:  Constitutional: positive for anorexia and fatigue Eyes: negative Ears, nose, mouth, throat, and face: negative Respiratory: positive for dyspnea on exertion and pleurisy/chest pain Cardiovascular: negative Gastrointestinal: negative Genitourinary:negative Integument/breast: negative Hematologic/lymphatic: negative Musculoskeletal:negative Neurological: negative Behavioral/Psych: negative Endocrine: negative Allergic/Immunologic: negative   PHYSICAL EXAMINATION: General appearance: alert, cooperative, fatigued and no distress Head: Normocephalic, without obvious abnormality, atraumatic Neck: no adenopathy, no JVD, supple, symmetrical, trachea midline and thyroid not enlarged, symmetric, no tenderness/mass/nodules Lymph nodes: Cervical, supraclavicular, and axillary nodes normal. Resp: clear to auscultation bilaterally Back: symmetric, no curvature. ROM normal. No CVA tenderness. Cardio: regular rate and rhythm, S1, S2 normal, no murmur, click, rub or gallop GI: soft, non-tender; bowel sounds normal; no masses,  no organomegaly Extremities: extremities normal, atraumatic, no cyanosis or edema Neurologic: Alert and oriented X 3, normal strength and tone. Normal symmetric reflexes. Normal coordination and gait  ECOG PERFORMANCE STATUS:  1 - Symptomatic but completely ambulatory  Blood pressure (!) 110/58, pulse (!) 102, temperature 99.8 F (37.7 C), temperature source Oral, resp. rate 17, weight 147 lb 3.2 oz (66.8 kg), SpO2 92 %.  LABORATORY DATA: Lab Results  Component Value Date   WBC 10.7 (H) 10/13/2015   HGB 9.2 (L) 10/13/2015   HCT 28.9 (L) 10/13/2015   MCV 102.5 (H) 10/13/2015   PLT 157 10/13/2015      Chemistry      Component Value Date/Time   NA 140 10/13/2015 0906   K 4.6 10/13/2015 0906   CL 108 06/03/2015 0332   CO2 23 10/13/2015 0906   BUN 16.4 10/13/2015 0906   CREATININE 1.2 10/13/2015 0906      Component Value Date/Time   CALCIUM 9.5 10/13/2015 0906   ALKPHOS 334 (H) 10/13/2015 0906   AST 55 (H) 10/13/2015 0906   ALT 19 10/13/2015 0906   BILITOT <0.30 10/13/2015 0906       RADIOGRAPHIC STUDIES: Dg Chest 2 View  Result Date: 09/18/2015 CLINICAL DATA:  Shortness of breath and wheezing. Cough and fever. History of lung cancer. EXAM: CHEST  2 VIEW COMPARISON:  09/12/2015 FINDINGS: Continued right middle lobe collapse with possible superimposed pneumonia in the right middle lobe and lower lobe. Opacity is increased in the lower lobe. Hyperinflation from COPD. Porta catheter on the right with tip at the SVC level. Stable right peritracheal fullness. Mild left basilar atelectasis. Trace pleural fluid. No pneumothorax. IMPRESSION: Right middle lobe collapse  with progressive lower lobe atelectasis or pneumonia, postobstructive. Electronically Signed   By: Monte Fantasia M.D.   On: 09/18/2015 11:48   Ct Angio Chest Pe W Or Wo Contrast  Result Date: 09/18/2015 CLINICAL DATA:  Short of breath with cough and wheezing for 1 week. Fever. History of lung carcinoma diagnosed in April 2017. Patient has completed chemotherapy. Radiation therapy and progress. EXAM: CT ANGIOGRAPHY CHEST WITH CONTRAST TECHNIQUE: Multidetector CT imaging of the chest was performed using the standard protocol during bolus  administration of intravenous contrast. Multiplanar CT image reconstructions and MIPs were obtained to evaluate the vascular anatomy. CONTRAST:  100 mL of Isovue 370 intravenous contrast. COMPARISON:  Current chest radiograph.  Chest CT, 08/29/2015. FINDINGS: Angiographic study: No evidence of pulmonary embolus. Aorta is normal in caliber. No dissection. No significant atherosclerotic plaque of the aorta or its branch vessels. Neck base and axilla: Several prominent right neck base lymph nodes, largest measuring 8 mm short axis. Mediastinum and hila: Bulky adenopathy. This has worsened since the prior CT. Largest node is an azygos level right peritracheal lymph node currently measuring 3.0 x 2.4 cm, previously 1.4 x 1.1 cm. Multiple other nodes have also increased in size. A subcarinal node, which merges with the right hilar mass, now measures 2.4 cm in short axis, previously 1.1 cm. Abnormal soft tissue surrounding the hilar structures on the right causes mild irregular narrowing of the right mainstem bronchus and bronchus intermedius. There is moderate narrowing of the right upper lobe bronchus and inclusion of the right middle lobe bronchus with occlusion of portions of the right lower lobe bronchus and proximal segmental bronchi. There has been a significant overall increase in the soft tissue when compared the prior exam. At the level of the takeoff of the right middle lobe bronchus, the tumor now measures 4 cm from anterior-posterior previously approximate 2.6 cm. The tumor also narrowing is pulmonary arteries including the right intralobar pulmonary artery and lower lobe pulmonary artery with more significant narrowing of the right middle lobe pulmonary arteries. There is also narrowing, a lesser degree, of right upper lobe segmental branches. Lungs and pleura: Right middle lobe is collapsed. There are multiple right lower lobe pulmonary nodules which have increased in size when compared to the prior exam.  Nodule in the antral lateral, peripheral right lower lobe currently measures 13 mm in diameter, previously 7 mm. A nodule in the posterior lower lobe, image 126, series 7, currently measures 7 mm, previously 4 mm. There also multiple new nodules. A poorly defined pleural based mass lies along the right lower lobe, image 21, series 7, measuring approximate 12 mm. Interstitial thickening has developed in the right lower lobe likely due to lymphatic outflow obstruction. A small right pleural effusion has increased in size from the prior study. The left lower lobe, there is a possible nodule versus focal atelectasis on image 132, series 7, measuring 7.6 mm in size. This is similar to the prior exam. Atelectasis is favored. Other areas of atelectasis noted in the posterior inferior left upper lobe and left lower lobe. There is stable apical scarring, right greater than left. Changes of moderate centrilobular emphysema are stable. Limited upper abdomen: Multiple liver metastatic lesions have increased in size and number since prior exam. Largest lesion is in the right lobe currently measuring 2.8 cm, previously 1.3 cm. Musculoskeletal:  No convincing osteoblastic or osteolytic lesions. Review of the MIP images confirms the above findings. IMPRESSION: 1. No evidence of a pulmonary embolus.  No acute finding. 2. There has been significant worsening of lung carcinoma, now with occlusion of the right middle lobe bronchus with right middle lobe atelectasis, occlusions of right lower lobe segmental bronchi, significant increase in size of the right the hilar tumor mass, significant worsening of mediastinal adenopathy, New and enlarged pulmonary nodules, mostly in the right lower lobe, new and pleural based masses on the right, and significant worsening of hepatic metastatic disease. Electronically Signed   By: Lajean Manes M.D.   On: 09/18/2015 14:44    ASSESSMENT AND PLAN: This is a very pleasant 65 years old white male  recently diagnosed with extensive stage small cell lung cancer status post 4 cycles of chemotherapy with carboplatin and etoposide. The patient is tolerating his treatment fairly well with no significant adverse effects.  Initial imaging studies showed improvement in his disease and the patient was considered for enrollment in a maintenance clinical trial with ROVA-T but unfortunately he became symptomatic and repeat imaging studies showed evidence for disease progression and the patient was not eligible for the trial. He completed a course of prophylactic cranial irradiation as well as palliative radiotherapy to the chest under the care of Dr. Tammi Klippel. I had a lengthy discussion with the patient and his wife today about his current condition and treatment options. I discussed with the patient options including palliative care and hospice referral versus consideration of proceeding with second line systemic chemotherapy with a combination of cisplatin 30 MG/M2 and irinotecan 65 MG/M2 on days 1 and 8 every 3 weeks. Other options including single agent Topotecan as well as treatment with immunotherapy were discussed but they were not be my preferred option at this point but could be considered in the future. I discussed with the patient adverse effect of this treatment including but not limited to alopecia, myelosuppression, nausea and vomiting, peripheral neuropathy, liver or renal dysfunction. For the persistent fever and questionable postobstructive pneumonia, I will start the patient on doxycycline 100 mg by mouth twice a day for 10 days. He is expected to start the first dose of this treatment on 10/17/2015 and the patient would come back for follow-up visit in 3 weeks for evaluation and management of any adverse effect of his treatment before starting cycle #2. The patient refill for Remeron as well as oxycodone. He was advised to call immediately if he has any concerning symptoms in the interval. The  patient voices understanding of current disease status and treatment options and is in agreement with the current care plan.  All questions were answered. The patient knows to call the clinic with any problems, questions or concerns. We can certainly see the patient much sooner if necessary.  Disclaimer: This note was dictated with voice recognition software. Similar sounding words can inadvertently be transcribed and may not be corrected upon review.

## 2015-10-17 ENCOUNTER — Ambulatory Visit: Payer: Medicare Other

## 2015-10-17 ENCOUNTER — Ambulatory Visit (HOSPITAL_BASED_OUTPATIENT_CLINIC_OR_DEPARTMENT_OTHER): Payer: Medicare Other

## 2015-10-17 ENCOUNTER — Other Ambulatory Visit: Payer: Self-pay | Admitting: *Deleted

## 2015-10-17 VITALS — BP 116/62 | HR 102 | Temp 99.1°F | Resp 18

## 2015-10-17 DIAGNOSIS — C3491 Malignant neoplasm of unspecified part of right bronchus or lung: Secondary | ICD-10-CM

## 2015-10-17 DIAGNOSIS — Z5111 Encounter for antineoplastic chemotherapy: Secondary | ICD-10-CM

## 2015-10-17 LAB — CBC WITH DIFFERENTIAL/PLATELET
BASO%: 0.2 % (ref 0.0–2.0)
Basophils Absolute: 0 10*3/uL (ref 0.0–0.1)
EOS ABS: 0.1 10*3/uL (ref 0.0–0.5)
EOS%: 0.6 % (ref 0.0–7.0)
HEMATOCRIT: 27.1 % — AB (ref 38.4–49.9)
HEMOGLOBIN: 8.7 g/dL — AB (ref 13.0–17.1)
LYMPH#: 0.9 10*3/uL (ref 0.9–3.3)
LYMPH%: 6.3 % — AB (ref 14.0–49.0)
MCH: 32.5 pg (ref 27.2–33.4)
MCHC: 32.1 g/dL (ref 32.0–36.0)
MCV: 101.1 fL — ABNORMAL HIGH (ref 79.3–98.0)
MONO#: 1.5 10*3/uL — AB (ref 0.1–0.9)
MONO%: 10.4 % (ref 0.0–14.0)
NEUT%: 82.5 % — AB (ref 39.0–75.0)
NEUTROS ABS: 11.8 10*3/uL — AB (ref 1.5–6.5)
PLATELETS: 127 10*3/uL — AB (ref 140–400)
RBC: 2.68 10*6/uL — ABNORMAL LOW (ref 4.20–5.82)
RDW: 16.9 % — ABNORMAL HIGH (ref 11.0–14.6)
WBC: 14.3 10*3/uL — AB (ref 4.0–10.3)
nRBC: 0 % (ref 0–0)

## 2015-10-17 LAB — COMPREHENSIVE METABOLIC PANEL
ALBUMIN: 2.5 g/dL — AB (ref 3.5–5.0)
ALK PHOS: 465 U/L — AB (ref 40–150)
ALT: 22 U/L (ref 0–55)
ANION GAP: 13 meq/L — AB (ref 3–11)
AST: 65 U/L — AB (ref 5–34)
BILIRUBIN TOTAL: 0.34 mg/dL (ref 0.20–1.20)
BUN: 22.8 mg/dL (ref 7.0–26.0)
CALCIUM: 9.5 mg/dL (ref 8.4–10.4)
CO2: 22 mEq/L (ref 22–29)
CREATININE: 1.2 mg/dL (ref 0.7–1.3)
Chloride: 103 mEq/L (ref 98–109)
EGFR: 60 mL/min/{1.73_m2} — ABNORMAL LOW (ref >90)
Glucose: 107 mg/dl (ref 70–140)
Potassium: 4.5 mEq/L (ref 3.5–5.1)
Sodium: 137 mEq/L (ref 136–145)
Total Protein: 7.5 g/dL (ref 6.4–8.3)

## 2015-10-17 LAB — MAGNESIUM: MAGNESIUM: 2.1 mg/dL (ref 1.5–2.5)

## 2015-10-17 MED ORDER — HEPARIN SOD (PORK) LOCK FLUSH 100 UNIT/ML IV SOLN
500.0000 [IU] | Freq: Once | INTRAVENOUS | Status: AC | PRN
  Administered 2015-10-17: 500 [IU]
  Filled 2015-10-17: qty 5

## 2015-10-17 MED ORDER — SODIUM CHLORIDE 0.9 % IV SOLN: Freq: Once | INTRAVENOUS | Status: DC

## 2015-10-17 MED ORDER — SODIUM CHLORIDE 0.9 % IV SOLN
30.0000 mg/m2 | Freq: Once | INTRAVENOUS | Status: AC
  Administered 2015-10-17: 54 mg via INTRAVENOUS
  Filled 2015-10-17: qty 54

## 2015-10-17 MED ORDER — POTASSIUM CHLORIDE 2 MEQ/ML IV SOLN
Freq: Once | INTRAVENOUS | Status: AC
  Administered 2015-10-17: 10:00:00 via INTRAVENOUS
  Filled 2015-10-17: qty 10

## 2015-10-17 MED ORDER — SODIUM CHLORIDE 0.9% FLUSH
10.0000 mL | INTRAVENOUS | Status: DC | PRN
  Administered 2015-10-17: 10 mL
  Filled 2015-10-17: qty 10

## 2015-10-17 MED ORDER — OXYCODONE-ACETAMINOPHEN 5-325 MG PO TABS
1.0000 | ORAL_TABLET | Freq: Once | ORAL | Status: AC
  Administered 2015-10-17: 1 via ORAL

## 2015-10-17 MED ORDER — SODIUM CHLORIDE 0.9 % IV SOLN
Freq: Once | INTRAVENOUS | Status: AC
  Administered 2015-10-17: 13:00:00 via INTRAVENOUS

## 2015-10-17 MED ORDER — PALONOSETRON HCL INJECTION 0.25 MG/5ML
INTRAVENOUS | Status: AC
  Filled 2015-10-17: qty 5

## 2015-10-17 MED ORDER — IRINOTECAN HCL CHEMO INJECTION 100 MG/5ML
65.0000 mg/m2 | Freq: Once | INTRAVENOUS | Status: AC
  Administered 2015-10-17: 120 mg via INTRAVENOUS
  Filled 2015-10-17: qty 6

## 2015-10-17 MED ORDER — SODIUM CHLORIDE 0.9 % IV SOLN
Freq: Once | INTRAVENOUS | Status: AC
  Administered 2015-10-17: 14:00:00 via INTRAVENOUS

## 2015-10-17 MED ORDER — SODIUM CHLORIDE 0.9 % IV SOLN
Freq: Once | INTRAVENOUS | Status: AC
  Administered 2015-10-17: 15:00:00 via INTRAVENOUS
  Filled 2015-10-17: qty 5

## 2015-10-17 MED ORDER — OXYCODONE-ACETAMINOPHEN 5-325 MG PO TABS
ORAL_TABLET | ORAL | Status: AC
Start: 2015-10-17 — End: 2015-10-17
  Filled 2015-10-17: qty 1

## 2015-10-17 MED ORDER — PALONOSETRON HCL INJECTION 0.25 MG/5ML
0.2500 mg | Freq: Once | INTRAVENOUS | Status: AC
  Administered 2015-10-17: 0.25 mg via INTRAVENOUS

## 2015-10-17 NOTE — Progress Notes (Signed)
Dr. Irene Limbo made aware that pt has not had adequate urine output to start chemo at this time after 2 hours of hydration.  He ordered a bolus of NS (see MAR) and pt agreed, he is also drinking by mouth with no shortness of breath or distress. (Dr. Julien Nordmann is not here).

## 2015-10-17 NOTE — Patient Instructions (Addendum)
[facility] Discharge Instructions for Patients Receiving Chemotherapy  Today you received the following chemotherapy agents Irinotecan and Cisplatin  To help prevent nausea and vomiting after your treatment, we encourage you to take your nausea medication as directed. No Zofran for 3 days. Take Compazine instead.   If you develop nausea and vomiting that is not controlled by your nausea medication, call the clinic.   BELOW ARE SYMPTOMS THAT SHOULD BE REPORTED IMMEDIATELY:  *FEVER GREATER THAN 100.5 F  *CHILLS WITH OR WITHOUT FEVER  NAUSEA AND VOMITING THAT IS NOT CONTROLLED WITH YOUR NAUSEA MEDICATION  *UNUSUAL SHORTNESS OF BREATH  *UNUSUAL BRUISING OR BLEEDING  TENDERNESS IN MOUTH AND THROAT WITH OR WITHOUT PRESENCE OF ULCERS  *URINARY PROBLEMS  *BOWEL PROBLEMS  UNUSUAL RASH Items with * indicate a potential emergency and should be followed up as soon as possible.  Feel free to call the clinic you have any questions or concerns. The clinic phone number is [phone].  Please show the Power at check-in to the Emergency Department and triage nurse.   Cisplatin injection What is this medicine? CISPLATIN (SIS pla tin) is a chemotherapy drug. It targets fast dividing cells, like cancer cells, and causes these cells to die. This medicine is used to treat many types of cancer like bladder, ovarian, and testicular cancers. This medicine may be used for other purposes; ask your health care provider or pharmacist if you have questions. What should I tell my health care provider before I take this medicine? They need to know if you have any of these conditions: -blood disorders -hearing problems -kidney disease -recent or ongoing radiation therapy -an unusual or allergic reaction to cisplatin, carboplatin, other chemotherapy, other medicines, foods, dyes, or preservatives -pregnant or trying to get pregnant -breast-feeding How should I use this  medicine? This drug is given as an infusion into a vein. It is administered in a hospital or clinic by a specially trained health care professional. Talk to your pediatrician regarding the use of this medicine in children. Special care may be needed. Overdosage: If you think you have taken too much of this medicine contact a poison control center or emergency room at once. NOTE: This medicine is only for you. Do not share this medicine with others. What if I miss a dose? It is important not to miss a dose. Call your doctor or health care professional if you are unable to keep an appointment. What may interact with this medicine? -dofetilide -foscarnet -medicines for seizures -medicines to increase blood counts like filgrastim, pegfilgrastim, sargramostim -probenecid -pyridoxine used with altretamine -rituximab -some antibiotics like amikacin, gentamicin, neomycin, polymyxin B, streptomycin, tobramycin -sulfinpyrazone -vaccines -zalcitabine Talk to your doctor or health care professional before taking any of these medicines: -acetaminophen -aspirin -ibuprofen -ketoprofen -naproxen This list may not describe all possible interactions. Give your health care provider a list of all the medicines, herbs, non-prescription drugs, or dietary supplements you use. Also tell them if you smoke, drink alcohol, or use illegal drugs. Some items may interact with your medicine. What should I watch for while using this medicine? Your condition will be monitored carefully while you are receiving this medicine. You will need important blood work done while you are taking this medicine. This drug may make you feel generally unwell. This is not uncommon, as chemotherapy can affect healthy cells as well as cancer cells. Report any side effects. Continue your course of treatment even though you feel ill unless your doctor  tells you to stop. In some cases, you may be given additional medicines to help with side  effects. Follow all directions for their use. Call your doctor or health care professional for advice if you get a fever, chills or sore throat, or other symptoms of a cold or flu. Do not treat yourself. This drug decreases your body's ability to fight infections. Try to avoid being around people who are sick. This medicine may increase your risk to bruise or bleed. Call your doctor or health care professional if you notice any unusual bleeding. Be careful brushing and flossing your teeth or using a toothpick because you may get an infection or bleed more easily. If you have any dental work done, tell your dentist you are receiving this medicine. Avoid taking products that contain aspirin, acetaminophen, ibuprofen, naproxen, or ketoprofen unless instructed by your doctor. These medicines may hide a fever. Do not become pregnant while taking this medicine. Women should inform their doctor if they wish to become pregnant or think they might be pregnant. There is a potential for serious side effects to an unborn child. Talk to your health care professional or pharmacist for more information. Do not breast-feed an infant while taking this medicine. Drink fluids as directed while you are taking this medicine. This will help protect your kidneys. Call your doctor or health care professional if you get diarrhea. Do not treat yourself. What side effects may I notice from receiving this medicine? Side effects that you should report to your doctor or health care professional as soon as possible: -allergic reactions like skin rash, itching or hives, swelling of the face, lips, or tongue -signs of infection - fever or chills, cough, sore throat, pain or difficulty passing urine -signs of decreased platelets or bleeding - bruising, pinpoint red spots on the skin, black, tarry stools, nosebleeds -signs of decreased red blood cells - unusually weak or tired, fainting spells, lightheadedness -breathing  problems -changes in hearing -gout pain -low blood counts - This drug may decrease the number of white blood cells, red blood cells and platelets. You may be at increased risk for infections and bleeding. -nausea and vomiting -pain, swelling, redness or irritation at the injection site -pain, tingling, numbness in the hands or feet -problems with balance, movement -trouble passing urine or change in the amount of urine Side effects that usually do not require medical attention (report to your doctor or health care professional if they continue or are bothersome): -changes in vision -loss of appetite -metallic taste in the mouth or changes in taste This list may not describe all possible side effects. Call your doctor for medical advice about side effects. You may report side effects to FDA at 1-800-FDA-1088. Where should I keep my medicine? This drug is given in a hospital or clinic and will not be stored at home. NOTE: This sheet is a summary. It may not cover all possible information. If you have questions about this medicine, talk to your doctor, pharmacist, or health care provider.    2016, Elsevier/Gold Standard. (2007-05-09 14:40:54)   Irinotecan injection What is this medicine? IRINOTECAN (ir in oh TEE kan ) is a chemotherapy drug. It is used to treat colon and rectal cancer. This medicine may be used for other purposes; ask your health care provider or pharmacist if you have questions. What should I tell my health care provider before I take this medicine? They need to know if you have any of these conditions: -blood disorders -dehydration -  diarrhea -infection (especially a virus infection such as chickenpox, cold sores, or herpes) -liver disease -low blood counts, like low white cell, platelet, or red cell counts -recent or ongoing radiation therapy -an unusual or allergic reaction to irinotecan, sorbitol, other chemotherapy, other medicines, foods, dyes, or  preservatives -pregnant or trying to get pregnant -breast-feeding How should I use this medicine? This drug is given as an infusion into a vein. It is administered in a hospital or clinic by a specially trained health care professional. Talk to your pediatrician regarding the use of this medicine in children. Special care may be needed. Overdosage: If you think you have taken too much of this medicine contact a poison control center or emergency room at once. NOTE: This medicine is only for you. Do not share this medicine with others. What if I miss a dose? It is important not to miss your dose. Call your doctor or health care professional if you are unable to keep an appointment. What may interact with this medicine? Do not take this medicine with any of the following medications: -atazanavir -certain medicines for fungal infections like itraconazole and ketoconazole -St. John's Wort This medicine may also interact with the following medications: -dexamethasone -diuretics -laxatives -medicines for seizures like carbamazepine, mephobarbital, phenobarbital, phenytoin, primidone -medicines to increase blood counts like filgrastim, pegfilgrastim, sargramostim -prochlorperazine -vaccines This list may not describe all possible interactions. Give your health care provider a list of all the medicines, herbs, non-prescription drugs, or dietary supplements you use. Also tell them if you smoke, drink alcohol, or use illegal drugs. Some items may interact with your medicine. What should I watch for while using this medicine? Your condition will be monitored carefully while you are receiving this medicine. You will need important blood work done while you are taking this medicine. This drug may make you feel generally unwell. This is not uncommon, as chemotherapy can affect healthy cells as well as cancer cells. Report any side effects. Continue your course of treatment even though you feel ill unless  your doctor tells you to stop. In some cases, you may be given additional medicines to help with side effects. Follow all directions for their use. You may get drowsy or dizzy. Do not drive, use machinery, or do anything that needs mental alertness until you know how this medicine affects you. Do not stand or sit up quickly, especially if you are an older patient. This reduces the risk of dizzy or fainting spells. Call your doctor or health care professional for advice if you get a fever, chills or sore throat, or other symptoms of a cold or flu. Do not treat yourself. This drug decreases your body's ability to fight infections. Try to avoid being around people who are sick. This medicine may increase your risk to bruise or bleed. Call your doctor or health care professional if you notice any unusual bleeding. Be careful brushing and flossing your teeth or using a toothpick because you may get an infection or bleed more easily. If you have any dental work done, tell your dentist you are receiving this medicine. Avoid taking products that contain aspirin, acetaminophen, ibuprofen, naproxen, or ketoprofen unless instructed by your doctor. These medicines may hide a fever. Do not become pregnant while taking this medicine. Women should inform their doctor if they wish to become pregnant or think they might be pregnant. There is a potential for serious side effects to an unborn child. Talk to your health care professional or pharmacist for  more information. Do not breast-feed an infant while taking this medicine. What side effects may I notice from receiving this medicine? Side effects that you should report to your doctor or health care professional as soon as possible: -allergic reactions like skin rash, itching or hives, swelling of the face, lips, or tongue -low blood counts - this medicine may decrease the number of white blood cells, red blood cells and platelets. You may be at increased risk for  infections and bleeding. -signs of infection - fever or chills, cough, sore throat, pain or difficulty passing urine -signs of decreased platelets or bleeding - bruising, pinpoint red spots on the skin, black, tarry stools, blood in the urine -signs of decreased red blood cells - unusually weak or tired, fainting spells, lightheadedness -breathing problems -chest pain -diarrhea -feeling faint or lightheaded, falls -flushing, runny nose, sweating during infusion -mouth sores or pain -pain, swelling, redness or irritation where injected -pain, swelling, warmth in the leg -pain, tingling, numbness in the hands or feet -problems with balance, talking, walking -stomach cramps, pain -trouble passing urine or change in the amount of urine -vomiting as to be unable to hold down drinks or food -yellowing of the eyes or skin Side effects that usually do not require medical attention (report to your doctor or health care professional if they continue or are bothersome): -constipation -hair loss -headache -loss of appetite -nausea, vomiting -stomach upset This list may not describe all possible side effects. Call your doctor for medical advice about side effects. You may report side effects to FDA at 1-800-FDA-1088. Where should I keep my medicine? This drug is given in a hospital or clinic and will not be stored at home. NOTE: This sheet is a summary. It may not cover all possible information. If you have questions about this medicine, talk to your doctor, pharmacist, or health care provider.    2016, Elsevier/Gold Standard. (2012-07-31 16:29:32)

## 2015-10-17 NOTE — Progress Notes (Unsigned)
Ok to treat with HR of 102 per MD Irene Limbo

## 2015-10-17 NOTE — Progress Notes (Signed)
Dr. Irene Limbo ordered US to infuse an additional 1/2L of NS then proceed with premedications and chemo regardless of urine output.

## 2015-10-22 ENCOUNTER — Telehealth: Payer: Self-pay

## 2015-10-22 ENCOUNTER — Telehealth: Payer: Self-pay | Admitting: Medical Oncology

## 2015-10-22 DIAGNOSIS — R066 Hiccough: Secondary | ICD-10-CM

## 2015-10-22 MED ORDER — CHLORPROMAZINE HCL 25 MG PO TABS: 25.0000 mg | ORAL_TABLET | Freq: Three times a day (TID) | ORAL | 0 refills | Status: DC | PRN

## 2015-10-22 NOTE — Telephone Encounter (Addendum)
Per Julien Nordmann I left a message that thorazine rx sent to local pharmacy and pain med refill will be ready Friday.I aso instructed via message not to take at same time as compazine.

## 2015-10-22 NOTE — Telephone Encounter (Signed)
Thorazine sent to pharmacy.

## 2015-10-22 NOTE — Telephone Encounter (Signed)
Wife called stating pt is having a lot of hiccups affecting his sleep and his breathing at times. "I feel like I am going to die" Since Sunday. No reflux, no cough. Feels like the hiccups are trying to shut down his breathing.  Please use mobile number to call back.  Port issue - the area where the tubing goes over the clavicle is tender. It is not red, no swollen. Was not sore before. No swelling in face or arm.  Pt has increased his oxycodone to taking about 4 times per day. He is function better with this increase in use.   His current rx is insufficient in quantity. Will need a refill before the weekend. The pt will be in for chemo on Friday.

## 2015-10-22 NOTE — Addendum Note (Signed)
Addended by: Ardeen Garland on: 10/22/2015 04:34 PM   Modules accepted: Orders

## 2015-10-23 ENCOUNTER — Encounter: Payer: Self-pay | Admitting: Internal Medicine

## 2015-10-23 NOTE — Progress Notes (Signed)
Send bcbs via covermymeds prior auth req for [employer]

## 2015-10-24 ENCOUNTER — Telehealth: Payer: Self-pay | Admitting: Medical Oncology

## 2015-10-24 ENCOUNTER — Telehealth: Payer: Self-pay | Admitting: Internal Medicine

## 2015-10-24 ENCOUNTER — Other Ambulatory Visit: Payer: Self-pay | Admitting: Medical Oncology

## 2015-10-24 ENCOUNTER — Ambulatory Visit: Payer: Medicare Other

## 2015-10-24 ENCOUNTER — Other Ambulatory Visit: Payer: Medicare Other

## 2015-10-24 ENCOUNTER — Encounter: Payer: Medicare Other | Admitting: Nutrition

## 2015-10-24 ENCOUNTER — Ambulatory Visit (HOSPITAL_BASED_OUTPATIENT_CLINIC_OR_DEPARTMENT_OTHER): Payer: Medicare Other

## 2015-10-24 VITALS — BP 105/70 | HR 98 | Temp 98.4°F | Resp 18

## 2015-10-24 DIAGNOSIS — K30 Functional dyspepsia: Secondary | ICD-10-CM

## 2015-10-24 DIAGNOSIS — Z5111 Encounter for antineoplastic chemotherapy: Secondary | ICD-10-CM | POA: Diagnosis present

## 2015-10-24 DIAGNOSIS — C3491 Malignant neoplasm of unspecified part of right bronchus or lung: Secondary | ICD-10-CM

## 2015-10-24 DIAGNOSIS — Z95828 Presence of other vascular implants and grafts: Secondary | ICD-10-CM

## 2015-10-24 DIAGNOSIS — C3490 Malignant neoplasm of unspecified part of unspecified bronchus or lung: Secondary | ICD-10-CM

## 2015-10-24 LAB — COMPREHENSIVE METABOLIC PANEL
ALT: 18 U/L (ref 0–55)
AST: 36 U/L — AB (ref 5–34)
Albumin: 2.5 g/dL — ABNORMAL LOW (ref 3.5–5.0)
Alkaline Phosphatase: 294 U/L — ABNORMAL HIGH (ref 40–150)
Anion Gap: 12 mEq/L — ABNORMAL HIGH (ref 3–11)
BUN: 38.7 mg/dL — AB (ref 7.0–26.0)
CHLORIDE: 105 meq/L (ref 98–109)
CO2: 20 meq/L — AB (ref 22–29)
Calcium: 9.2 mg/dL (ref 8.4–10.4)
Creatinine: 1.3 mg/dL (ref 0.7–1.3)
EGFR: 56 mL/min/{1.73_m2} — ABNORMAL LOW (ref >90)
GLUCOSE: 127 mg/dL (ref 70–140)
Potassium: 4.2 mEq/L (ref 3.5–5.1)
SODIUM: 137 meq/L (ref 136–145)
Total Bilirubin: <0.3 mg/dL (ref 0.20–1.20)
Total Protein: 7 g/dL (ref 6.4–8.3)

## 2015-10-24 LAB — CBC WITH DIFFERENTIAL/PLATELET
BASO%: 0.2 % (ref 0.0–2.0)
Basophils Absolute: 0 10*3/uL (ref 0.0–0.1)
EOS%: 1.2 % (ref 0.0–7.0)
Eosinophils Absolute: 0.1 10*3/uL (ref 0.0–0.5)
HCT: 25.5 % — ABNORMAL LOW (ref 38.4–49.9)
HGB: 8.3 g/dL — ABNORMAL LOW (ref 13.0–17.1)
LYMPH#: 0.5 10*3/uL — AB (ref 0.9–3.3)
LYMPH%: 7.5 % — AB (ref 14.0–49.0)
MCH: 33 pg (ref 27.2–33.4)
MCHC: 32.7 g/dL (ref 32.0–36.0)
MCV: 100.9 fL — ABNORMAL HIGH (ref 79.3–98.0)
MONO#: 0.5 10*3/uL (ref 0.1–0.9)
MONO%: 7 % (ref 0.0–14.0)
NEUT#: 5.9 10*3/uL (ref 1.5–6.5)
NEUT%: 84.1 % — AB (ref 39.0–75.0)
Platelets: 110 10*3/uL — ABNORMAL LOW (ref 140–400)
RBC: 2.53 10*6/uL — AB (ref 4.20–5.82)
RDW: 17.3 % — ABNORMAL HIGH (ref 11.0–14.6)
WBC: 7 10*3/uL (ref 4.0–10.3)

## 2015-10-24 LAB — MAGNESIUM: Magnesium: 2 mg/dl (ref 1.5–2.5)

## 2015-10-24 MED ORDER — ALUM & MAG HYDROXIDE-SIMETH 200-200-20 MG/5ML PO SUSP
30.0000 mL | ORAL | Status: AC
Start: 2015-10-24 — End: 2015-10-24
  Administered 2015-10-24: 30 mL via ORAL
  Filled 2015-10-24: qty 30

## 2015-10-24 MED ORDER — PALONOSETRON HCL INJECTION 0.25 MG/5ML
0.2500 mg | Freq: Once | INTRAVENOUS | Status: AC
  Administered 2015-10-24: 0.25 mg via INTRAVENOUS

## 2015-10-24 MED ORDER — HEPARIN SOD (PORK) LOCK FLUSH 100 UNIT/ML IV SOLN
500.0000 [IU] | Freq: Once | INTRAVENOUS | Status: AC | PRN
  Administered 2015-10-24: 500 [IU]
  Filled 2015-10-24: qty 5

## 2015-10-24 MED ORDER — IRINOTECAN HCL CHEMO INJECTION 100 MG/5ML
65.0000 mg/m2 | Freq: Once | INTRAVENOUS | Status: AC
  Administered 2015-10-24: 120 mg via INTRAVENOUS
  Filled 2015-10-24: qty 6

## 2015-10-24 MED ORDER — FOSAPREPITANT DIMEGLUMINE INJECTION 150 MG
Freq: Once | INTRAVENOUS | Status: AC
  Administered 2015-10-24: 13:00:00 via INTRAVENOUS
  Filled 2015-10-24: qty 5

## 2015-10-24 MED ORDER — SODIUM CHLORIDE 0.9 % IV SOLN
30.0000 mg/m2 | Freq: Once | INTRAVENOUS | Status: AC
  Administered 2015-10-24: 54 mg via INTRAVENOUS
  Filled 2015-10-24: qty 54

## 2015-10-24 MED ORDER — SODIUM CHLORIDE 0.9 % IV SOLN
Freq: Once | INTRAVENOUS | Status: AC
Start: 2015-10-24 — End: 2015-10-24
  Administered 2015-10-24: 10:00:00 via INTRAVENOUS

## 2015-10-24 MED ORDER — POTASSIUM CHLORIDE 2 MEQ/ML IV SOLN
Freq: Once | INTRAVENOUS | Status: AC
  Administered 2015-10-24: 10:00:00 via INTRAVENOUS
  Filled 2015-10-24: qty 10

## 2015-10-24 MED ORDER — SODIUM CHLORIDE 0.9% FLUSH
10.0000 mL | INTRAVENOUS | Status: DC | PRN
  Administered 2015-10-24: 10 mL
  Filled 2015-10-24: qty 10

## 2015-10-24 MED ORDER — ATROPINE SULFATE 1 MG/ML IJ SOLN
INTRAMUSCULAR | Status: AC
  Filled 2015-10-24: qty 1

## 2015-10-24 MED ORDER — OXYCODONE HCL 5 MG PO TABS: ORAL_TABLET | ORAL | 0 refills | Status: DC

## 2015-10-24 MED ORDER — PALONOSETRON HCL INJECTION 0.25 MG/5ML
INTRAVENOUS | Status: AC
  Filled 2015-10-24: qty 5

## 2015-10-24 MED ORDER — ATROPINE SULFATE 1 MG/ML IJ SOLN
0.5000 mg | Freq: Once | INTRAMUSCULAR | Status: AC | PRN
  Administered 2015-10-24: 0.5 mg via INTRAVENOUS

## 2015-10-24 NOTE — Telephone Encounter (Signed)
Called Dr Inda Merlin for oxycodone refill enough for the weekend.

## 2015-10-24 NOTE — Telephone Encounter (Signed)
Spoke to [name] and she asked if Dr.K would prescribe enough Oxycodone for pt until Monday when Dr. Julien Nordmann returns. Told her I am sure that would be fine. I will have Rx ready for pickup by 2:00 for pt. [name] verbalized understanding.

## 2015-10-24 NOTE — Telephone Encounter (Signed)
Discussed medication request with Dr.K for pt and he said that is fine will prescribe one month supply. Told him okay. Rx printed and signed by Dr.K. Rx put at the front desk.

## 2015-10-24 NOTE — Patient Instructions (Signed)
[facility] Discharge Instructions for Patients Receiving Chemotherapy  Today you received the following chemotherapy agents:  Cisplatin, Irinotecan  To help prevent nausea and vomiting after your treatment, we encourage you to take your nausea medication as prescribed.   If you develop nausea and vomiting that is not controlled by your nausea medication, call the clinic.   BELOW ARE SYMPTOMS THAT SHOULD BE REPORTED IMMEDIATELY:  *FEVER GREATER THAN 100.5 F  *CHILLS WITH OR WITHOUT FEVER  NAUSEA AND VOMITING THAT IS NOT CONTROLLED WITH YOUR NAUSEA MEDICATION  *UNUSUAL SHORTNESS OF BREATH  *UNUSUAL BRUISING OR BLEEDING  TENDERNESS IN MOUTH AND THROAT WITH OR WITHOUT PRESENCE OF ULCERS  *URINARY PROBLEMS  *BOWEL PROBLEMS  UNUSUAL RASH Items with * indicate a potential emergency and should be followed up as soon as possible.  Feel free to call the clinic you have any questions or concerns. The clinic phone number is [phone].  Please show the CHEMO ALERT CARD at check-in to the Emergency Department and triage nurse.   

## 2015-10-24 NOTE — Telephone Encounter (Signed)
He is there at the cancer center now. He needs a refill on his oxyCODONE (ROXICODONE) 5 MG immediate release tablet   Can you just prescrib enough till Monday and she can get Dr. Inda Merlin to refill it on Monday.   [name] from the [facility] [phone])

## 2015-10-27 ENCOUNTER — Emergency Department (HOSPITAL_COMMUNITY)
Admission: EM | Admit: 2015-10-27 | Discharge: 2015-10-27 | Disposition: A | Discharge status: Home / Self Care | Payer: Medicare Other | Attending: Emergency Medicine | Admitting: Emergency Medicine

## 2015-10-27 ENCOUNTER — Other Ambulatory Visit: Payer: Self-pay | Admitting: *Deleted

## 2015-10-27 ENCOUNTER — Emergency Department (HOSPITAL_COMMUNITY): Payer: Medicare Other

## 2015-10-27 ENCOUNTER — Encounter (HOSPITAL_COMMUNITY): Payer: Self-pay | Admitting: Emergency Medicine

## 2015-10-27 ENCOUNTER — Encounter: Payer: Self-pay | Admitting: *Deleted

## 2015-10-27 DIAGNOSIS — N200 Calculus of kidney: Secondary | ICD-10-CM | POA: Diagnosis not present

## 2015-10-27 DIAGNOSIS — Z87891 Personal history of nicotine dependence: Secondary | ICD-10-CM | POA: Insufficient documentation

## 2015-10-27 DIAGNOSIS — C3490 Malignant neoplasm of unspecified part of unspecified bronchus or lung: Secondary | ICD-10-CM

## 2015-10-27 DIAGNOSIS — I10 Essential (primary) hypertension: Secondary | ICD-10-CM | POA: Diagnosis not present

## 2015-10-27 DIAGNOSIS — Z85118 Personal history of other malignant neoplasm of bronchus and lung: Secondary | ICD-10-CM | POA: Insufficient documentation

## 2015-10-27 DIAGNOSIS — Z85828 Personal history of other malignant neoplasm of skin: Secondary | ICD-10-CM | POA: Diagnosis not present

## 2015-10-27 DIAGNOSIS — Z79899 Other long term (current) drug therapy: Secondary | ICD-10-CM | POA: Insufficient documentation

## 2015-10-27 DIAGNOSIS — R1031 Right lower quadrant pain: Secondary | ICD-10-CM | POA: Diagnosis present

## 2015-10-27 LAB — URINALYSIS, ROUTINE W REFLEX MICROSCOPIC
BILIRUBIN URINE: NEGATIVE
Glucose, UA: NEGATIVE mg/dL
Ketones, ur: NEGATIVE mg/dL
Leukocytes, UA: NEGATIVE
Nitrite: NEGATIVE
PROTEIN: NEGATIVE mg/dL
Specific Gravity, Urine: 1.018 (ref 1.005–1.030)
pH: 5 (ref 5.0–8.0)

## 2015-10-27 LAB — CBC
HCT: 23.8 % — ABNORMAL LOW (ref 39.0–52.0)
Hemoglobin: 8 g/dL — ABNORMAL LOW (ref 13.0–17.0)
MCH: 33.8 pg (ref 26.0–34.0)
MCHC: 33.6 g/dL (ref 30.0–36.0)
MCV: 100.4 fL — ABNORMAL HIGH (ref 78.0–100.0)
PLATELETS: 135 10*3/uL — AB (ref 150–400)
RBC: 2.37 MIL/uL — AB (ref 4.22–5.81)
RDW: 16 % — ABNORMAL HIGH (ref 11.5–15.5)
WBC: 5.5 10*3/uL (ref 4.0–10.5)

## 2015-10-27 LAB — COMPREHENSIVE METABOLIC PANEL
ALK PHOS: 214 U/L — AB (ref 38–126)
ALT: 21 U/L (ref 17–63)
AST: 36 U/L (ref 15–41)
Albumin: 3.1 g/dL — ABNORMAL LOW (ref 3.5–5.0)
Anion gap: 9 (ref 5–15)
BILIRUBIN TOTAL: 0.5 mg/dL (ref 0.3–1.2)
BUN: 41 mg/dL — ABNORMAL HIGH (ref 6–20)
CALCIUM: 8.4 mg/dL — AB (ref 8.9–10.3)
CO2: 21 mmol/L — AB (ref 22–32)
CREATININE: 1.52 mg/dL — AB (ref 0.61–1.24)
Chloride: 103 mmol/L (ref 101–111)
GFR calc non Af Amer: 46 mL/min — ABNORMAL LOW (ref >60)
GFR, EST AFRICAN AMERICAN: 53 mL/min — AB (ref >60)
Glucose, Bld: 140 mg/dL — ABNORMAL HIGH (ref 65–99)
Potassium: 4.3 mmol/L (ref 3.5–5.1)
SODIUM: 133 mmol/L — AB (ref 135–145)
Total Protein: 6.8 g/dL (ref 6.5–8.1)

## 2015-10-27 LAB — URINE MICROSCOPIC-ADD ON

## 2015-10-27 LAB — LIPASE, BLOOD: Lipase: 24 U/L (ref 11–51)

## 2015-10-27 MED ORDER — ONDANSETRON HCL 4 MG/2ML IJ SOLN
4.0000 mg | Freq: Once | INTRAMUSCULAR | Status: AC
  Administered 2015-10-27: 4 mg via INTRAVENOUS
  Filled 2015-10-27: qty 2

## 2015-10-27 MED ORDER — OXYCODONE HCL 5 MG PO TABS: ORAL_TABLET | ORAL | 0 refills | Status: DC

## 2015-10-27 MED ORDER — SODIUM CHLORIDE 0.9 % IV BOLUS (SEPSIS)
500.0000 mL | Freq: Once | INTRAVENOUS | Status: AC
  Administered 2015-10-27: 500 mL via INTRAVENOUS

## 2015-10-27 MED ORDER — HEPARIN SOD (PORK) LOCK FLUSH 100 UNIT/ML IV SOLN
500.0000 [IU] | Freq: Once | INTRAVENOUS | Status: AC
  Administered 2015-10-27: 500 [IU]
  Filled 2015-10-27: qty 5

## 2015-10-27 MED ORDER — HYDROMORPHONE HCL 1 MG/ML IJ SOLN
1.0000 mg | Freq: Once | INTRAMUSCULAR | Status: AC
  Administered 2015-10-27: 1 mg via INTRAVENOUS
  Filled 2015-10-27: qty 1

## 2015-10-27 NOTE — ED Triage Notes (Signed)
Pt has lung cancer.  Chemo was last Friday.  C/o rt flank pain to mid abd pain since last night.  Nauseated, no vomiting, no diarrhea.  Took 2 oxycodone.

## 2015-10-27 NOTE — ED Notes (Signed)
PT DISCHARGED. INSTRUCTIONS GIVEN. AAOX3. PT IN NO APPARENT DISTRESS. THE OPPORTUNITY TO ASK QUESTIONS WAS PROVIDED. 

## 2015-10-27 NOTE — ED Provider Notes (Signed)
[place] DEPT Provider Note   CSN: [id number] Arrival date & time: 10/27/15  1118     History   Chief Complaint Chief Complaint  Patient presents with  . Abdominal Pain    HPI Shane Vasquez is a 65 y.o. male.  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  Patient states it's not unusual for him to have lower abdominal cramping which is gone on with his cancer and chemotherapy but this is a pain that's very different from that. Very sharp in nature and these never had anything quite like it. He has had some small amounts of urine today and it seems to be normal but is been unable to eat due to nausea and pain. Chest pain, shortness of breath worsened.   The history is provided by the patient.  Abdominal Pain   This is a new problem. The current episode started 6 to 12 hours ago. The problem occurs constantly. The problem has not changed since onset.Associated with: woke up from sleep at 6am with pain. The pain is located in the RLQ (right flank radiating to the RLQ). The quality of the pain is shooting, sharp and throbbing. The pain is at a severity of 9/10. The pain is severe. Associated symptoms include anorexia and nausea. Pertinent negatives include fever, diarrhea, vomiting, dysuria and headaches. Nothing aggravates the symptoms. Relieved by: tried 2 oxycodone at home without improvement.    Past Medical History:  Diagnosis Date  . CAP (community acquired pneumonia)    LLL 07/2014  . CERVICAL RADICULOPATHY, RIGHT 08/12/2009  . DIVERTICULOSIS, COLON 08/15/2006  . Empyema lung ([place])    2016 on left  . GERD 08/15/2006  . HYPERLIPIDEMIA 08/15/2006  . HYPERTENSION 08/15/2006  . INTERNAL HEMORRHOIDS 02/29/2008  . Lung cancer ([place])     metastatic small cell lung cancer  . SKIN CANCER, HX OF 08/15/2006    Patient Active Problem List   Diagnosis Date Noted  . Encounter for antineoplastic chemotherapy 07/15/2015  . Port catheter in place 06/23/2015  . Protein-calorie malnutrition, severe 05/31/2015  . Hyperuricemia   . Metastatic carcinoma ([place])   . Small cell carcinoma of lung ([place]) 05/29/2015  . Metastatic small cell carcinoma to liver ([place]) 05/29/2015  . Metastatic lung cancer (metastasis from lung to other site) [facility])   . Shortness of breath 05/18/2015  . Pleural effusion 05/18/2015  . Liver lesion 05/18/2015  . Elevated LFTs 05/18/2015  . Enlarged lymph nodes   . Anemia, iron deficiency 08/01/2014  . CERVICAL RADICULOPATHY, RIGHT 08/12/2009  . INTERNAL HEMORRHOIDS 02/29/2008  . Dyslipidemia 08/15/2006  . Essential hypertension 08/15/2006  . GERD 08/15/2006  . Diverticulosis of large intestine 08/15/2006  . SKIN CANCER, HX OF 08/15/2006    Past Surgical History:  Procedure Laterality Date  . TONSILLECTOMY    . VIDEO ASSISTED THORACOSCOPY (VATS)/EMPYEMA Left 07/27/2014   Procedure: VIDEO ASSISTED THORACOSCOPY (VATS)/EMPYEMA Left, drainage pleural effusion.;  Surgeon: [name], MD;  Location: [facility];  Service: Thoracic;  Laterality: Left;  Marland Kitchen VIDEO BRONCHOSCOPY N/A 07/27/2014   Procedure: VIDEO BRONCHOSCOPY;  Surgeon: [name], MD;  Location[facility];  Service: Thoracic;  Laterality: N/A;  . VIDEO BRONCHOSCOPY Bilateral 05/20/2015   Procedure: VIDEO BRONCHOSCOPY WITH FLUORO;  Surgeon: Collene Gobble, MD;  Location: [place];  Service: Cardiopulmonary;  Laterality: Bilateral;       Home Medications    Prior to Admission medications   Medication Sig Start Date End Date Taking? Authorizing Provider  acetaminophen (TYLENOL) 500 MG tablet Take 1,000 mg by mouth every 8 (eight) hours as needed.    Historical Provider, MD  albuterol (PROVENTIL HFA;VENTOLIN HFA) 108 (90 Base) MCG/ACT inhaler  Inhale 1-2 puffs into the lungs every 6 (six) hours as needed for wheezing or shortness of breath. 09/12/15   Susanne Borders, NP  chlorproMAZINE (THORAZINE) 25 MG tablet Take 1 tablet (25 mg total) by mouth 3 (three) times daily as needed. for  hiccoughs 10/22/15   Curt Bears, MD  diphenhydramine-acetaminophen (TYLENOL PM) 25-500 MG TABS tablet Take 1 tablet by mouth at bedtime as needed.    Historical Provider, MD  doxycycline (VIBRA-TABS) 100 MG tablet Take 1 tablet (100 mg total) by mouth 2 (two) times daily. 10/13/15   Curt Bears, MD  EPINEPHrine 0.3 mg/0.3 mL IJ SOAJ injection Inject 0.3 mLs (0.3 mg total) into the muscle once. Patient not taking: Reported on 10/13/2015 08/08/14   Marletta Lor, MD  lidocaine-prilocaine (EMLA) cream Apply to port -a-cath 1-2 hours prior to access. 06/23/15   Maryanna Shape, NP  mirtazapine (REMERON) 15 MG tablet Take 1 tablet (15 mg total) by mouth at bedtime. 10/13/15   Curt Bears, MD  oxyCODONE (OXY IR/ROXICODONE) 5 MG immediate release tablet TK 1 TO 2 TS PO Q 4 TO 6 H PRF SEVERE PAIN 10/24/15   Marletta Lor, MD  oxymetazoline (AFRIN) 0.05 % nasal spray Place 1 spray into both nostrils 2 (two) times daily.    Historical Provider, MD  polyethylene glycol (MIRALAX / GLYCOLAX) packet Take 17 g by mouth daily as needed for mild constipation.    Historical Provider, MD  prochlorperazine (COMPAZINE) 10 MG tablet Take 1 tablet (10 mg total) by mouth every 6 (six) hours as needed for nausea or vomiting. Patient not taking: Reported on 10/13/2015 06/24/15   Curt Bears, MD  senna (SENOKOT) 8.6 MG TABS tablet Take 1 tablet (8.6 mg total) by mouth daily. 06/04/15   Domenic Polite, MD  Wound Dressings (SONAFINE EX) Apply topically.    Historical Provider, MD    Family History Family History  Problem Relation Age of Onset  . Osteoporosis Mother   . COPD Father     Social History Social History  Substance Use Topics  . Smoking status: Former  Smoker    Packs/day: 1.00    Types: Cigarettes    Quit date: 08/29/2012  . Smokeless tobacco: Never Used  . Alcohol use No     Allergies   Bee venom   Review of Systems Review of Systems  Constitutional: Negative for fever.  Gastrointestinal: Positive for abdominal pain, anorexia and nausea. Negative for diarrhea and vomiting.  Genitourinary: Negative for dysuria.  Neurological: Negative for headaches.  All other systems reviewed and are negative.    Physical Exam Updated Vital Signs BP 149/81 (BP Location: Left Arm)   Pulse 85   Temp 98.2 F (36.8 C) (Oral)   Resp 18   SpO2 100%   Physical Exam  Constitutional: He is oriented to person, place, and time. He appears well-developed and well-nourished. No distress.  HENT:  Head: Normocephalic and atraumatic.  Mouth/Throat: Oropharynx is clear and moist. Mucous membranes are dry.  Eyes: Conjunctivae and EOM are normal. Pupils  are equal, round, and reactive to light.  Neck: Normal range of motion. Neck supple.  Cardiovascular: Normal rate, regular rhythm and intact distal pulses.   No murmur heard. Pulmonary/Chest: Effort normal and breath sounds normal. No respiratory distress. He has no wheezes. He has no rales.  Abdominal: Soft. He exhibits no distension. There is tenderness. There is CVA tenderness. There is no rebound and no guarding.  No rashes on abd  Musculoskeletal: Normal range of motion. He exhibits no edema or tenderness.  Neurological: He is alert and oriented to person, place, and time.  Skin: Skin is warm and dry. No rash noted. No erythema. There is pallor.  Psychiatric: He has a normal mood and affect. His behavior is normal.  Nursing note and vitals reviewed.    ED Treatments / Results  Labs (all labs ordered are listed, but only abnormal results are displayed) Labs Reviewed  COMPREHENSIVE METABOLIC PANEL - Abnormal; Notable for the following:       Result Value   Sodium 133 (*)    CO2 21 (*)     Glucose, Bld 140 (*)    BUN 41 (*)    Creatinine, Ser 1.52 (*)    Calcium 8.4 (*)    Albumin 3.1 (*)    Alkaline Phosphatase 214 (*)    GFR calc non Af Amer 46 (*)    GFR calc Af Amer 53 (*)    All other components within normal limits  CBC - Abnormal; Notable for the following:    RBC 2.37 (*)    Hemoglobin 8.0 (*)    HCT 23.8 (*)    MCV 100.4 (*)    RDW 16.0 (*)    Platelets 135 (*)    All other components within normal limits  URINALYSIS, ROUTINE W REFLEX MICROSCOPIC (NOT AT [facility]) - Abnormal; Notable for the following:    APPearance CLOUDY (*)    Hgb urine dipstick MODERATE (*)    All other components within normal limits  URINE MICROSCOPIC-ADD ON - Abnormal; Notable for the following:    Squamous Epithelial / LPF 0-5 (*)    Bacteria, UA RARE (*)    Crystals URIC ACID CRYSTALS (*)    All other components within normal limits  LIPASE, BLOOD    EKG  EKG Interpretation None       Radiology No results found.  Procedures Procedures (including critical care time)  Medications Ordered in ED Medications  HYDROmorphone (DILAUDID) injection 1 mg (not administered)  ondansetron (ZOFRAN) injection 4 mg (not administered)  sodium chloride 0.9 % bolus 500 mL (not administered)     Initial Impression / Assessment and Plan / ED Course  I have reviewed the triage vital signs and the nursing notes.  Pertinent labs & imaging results that were available during my care of the patient were reviewed by me and considered in my medical decision making (see chart for details).  Clinical Course   Pt with symptoms consistent with kidney stone but no prior hx of stones and complicated hx of current chemo for small cell lung cancer with mets to liver.  Denies infectious sx, or GI symptoms.  Low concern for diverticulitis  No hx suggestive of GU source (discharge) and otherwise pt is healthy.  Will hydrate, treat pain and ensure no infection with UA, CBC, CMP and will get stone study to  further eval to ensure no complication of cancer.  Pt labs without acute changes except for blood in urine.  CT showed  91m renal stone and hydronephrosis.  Also showed metastatic disease in the liver and possible compression fx however pt is aware or metastatic disease and is currently getting chemo.  He has no other c/o today except for the acute onset of pain characteristic of kidney stone.  Pt is pain controlled after 1 of dilaudid.  Pt has pain meds at home and given strainer and Urology f/u if stone does not pass.  Given strict return precautions.   Final Clinical Impressions(s) / ED Diagnoses   Final diagnoses:  Kidney stone    New Prescriptions Discharge Medication List as of 10/27/2015  1:34 PM       WBlanchie Dessert MD 10/31/15 1952

## 2015-10-29 ENCOUNTER — Telehealth: Payer: Self-pay | Admitting: *Deleted

## 2015-10-29 ENCOUNTER — Other Ambulatory Visit: Payer: Self-pay | Admitting: *Deleted

## 2015-10-29 DIAGNOSIS — R112 Nausea with vomiting, unspecified: Secondary | ICD-10-CM

## 2015-10-29 MED ORDER — ONDANSETRON HCL 8 MG PO TABS: 8.0000 mg | ORAL_TABLET | Freq: Three times a day (TID) | ORAL | 1 refills | Status: DC | PRN

## 2015-10-29 MED ORDER — PROCHLORPERAZINE MALEATE 10 MG PO TABS: 10.0000 mg | ORAL_TABLET | Freq: Four times a day (QID) | ORAL | 0 refills | Status: AC | PRN

## 2015-10-29 NOTE — Telephone Encounter (Signed)
Received call from wife requesting refill of Zofran.   Wife stated pt has had nausea , vomited only  X 1.  Stated pt is supposed to take Zofran 8 mg every 8 hours as needed - but wife thinks pt is taking more often than 8 hours as needed. Asked it pt takes Compazine in between Zofran for nausea, wife did not think pt has this med.  Wife also requested refill of both Zofran and Compazine.   Spoke with Shauna Hugh, Theme park manager.  OK for refills.    Scripts called in to pt's pharmacy with special instructions -  Not to take  Compazine with Thorazine.   Pharmacist voiced understanding and stated pt will be counselled when picking up meds.

## 2015-10-29 NOTE — Telephone Encounter (Signed)
Pharmacist at [facility] called asking for information.  Medicare needs to know the names of the chemotherapy agents patient receiving and the dates received and pending.  Provided ICD-10 codes, Cycle 1 and upcoming Cycle 2 information.  No further questions.

## 2015-10-30 ENCOUNTER — Telehealth: Payer: Self-pay | Admitting: *Deleted

## 2015-10-30 NOTE — Telephone Encounter (Signed)
"  [employer] calling in reference to Ondansetron.  This has been approved effective today for one year."  Notified Drug replacement Specialist about this medication

## 2015-11-06 ENCOUNTER — Ambulatory Visit (HOSPITAL_BASED_OUTPATIENT_CLINIC_OR_DEPARTMENT_OTHER): Payer: Medicare Other

## 2015-11-06 ENCOUNTER — Ambulatory Visit (HOSPITAL_BASED_OUTPATIENT_CLINIC_OR_DEPARTMENT_OTHER): Payer: Medicare Other | Admitting: Nurse Practitioner

## 2015-11-06 ENCOUNTER — Other Ambulatory Visit (HOSPITAL_BASED_OUTPATIENT_CLINIC_OR_DEPARTMENT_OTHER): Payer: Medicare Other

## 2015-11-06 ENCOUNTER — Ambulatory Visit: Payer: Medicare Other

## 2015-11-06 ENCOUNTER — Ambulatory Visit (HOSPITAL_COMMUNITY)
Admission: RE | Admit: 2015-11-06 | Discharge: 2015-11-06 | Disposition: A | Discharge status: Home / Self Care | Payer: Medicare Other | Source: Ambulatory Visit | Attending: Internal Medicine | Admitting: Internal Medicine

## 2015-11-06 ENCOUNTER — Telehealth: Payer: Self-pay | Admitting: Internal Medicine

## 2015-11-06 ENCOUNTER — Other Ambulatory Visit: Payer: Self-pay | Admitting: Internal Medicine

## 2015-11-06 VITALS — BP 130/56 | HR 73 | Temp 97.9°F | Resp 17 | Ht 69.0 in | Wt 138.5 lb

## 2015-11-06 DIAGNOSIS — D6481 Anemia due to antineoplastic chemotherapy: Secondary | ICD-10-CM

## 2015-11-06 DIAGNOSIS — C3432 Malignant neoplasm of lower lobe, left bronchus or lung: Secondary | ICD-10-CM | POA: Diagnosis not present

## 2015-11-06 DIAGNOSIS — C342 Malignant neoplasm of middle lobe, bronchus or lung: Secondary | ICD-10-CM

## 2015-11-06 DIAGNOSIS — R530 Neoplastic (malignant) related fatigue: Secondary | ICD-10-CM

## 2015-11-06 DIAGNOSIS — Z95828 Presence of other vascular implants and grafts: Secondary | ICD-10-CM

## 2015-11-06 DIAGNOSIS — C786 Secondary malignant neoplasm of retroperitoneum and peritoneum: Secondary | ICD-10-CM

## 2015-11-06 DIAGNOSIS — C787 Secondary malignant neoplasm of liver and intrahepatic bile duct: Secondary | ICD-10-CM | POA: Insufficient documentation

## 2015-11-06 DIAGNOSIS — C3491 Malignant neoplasm of unspecified part of right bronchus or lung: Secondary | ICD-10-CM

## 2015-11-06 DIAGNOSIS — C3431 Malignant neoplasm of lower lobe, right bronchus or lung: Secondary | ICD-10-CM | POA: Diagnosis not present

## 2015-11-06 DIAGNOSIS — J90 Pleural effusion, not elsewhere classified: Secondary | ICD-10-CM

## 2015-11-06 DIAGNOSIS — R110 Nausea: Secondary | ICD-10-CM | POA: Diagnosis not present

## 2015-11-06 DIAGNOSIS — D649 Anemia, unspecified: Secondary | ICD-10-CM | POA: Diagnosis present

## 2015-11-06 LAB — COMPREHENSIVE METABOLIC PANEL
ALBUMIN: 2.8 g/dL — AB (ref 3.5–5.0)
ALK PHOS: 196 U/L — AB (ref 40–150)
ALT: 12 U/L (ref 0–55)
ANION GAP: 9 meq/L (ref 3–11)
AST: 21 U/L (ref 5–34)
BUN: 15.3 mg/dL (ref 7.0–26.0)
CO2: 24 mEq/L (ref 22–29)
Calcium: 8.8 mg/dL (ref 8.4–10.4)
Chloride: 107 mEq/L (ref 98–109)
Creatinine: 1.4 mg/dL — ABNORMAL HIGH (ref 0.7–1.3)
EGFR: 54 mL/min/{1.73_m2} — AB (ref >90)
GLUCOSE: 88 mg/dL (ref 70–140)
Potassium: 4.1 mEq/L (ref 3.5–5.1)
SODIUM: 139 meq/L (ref 136–145)
TOTAL PROTEIN: 6.7 g/dL (ref 6.4–8.3)

## 2015-11-06 LAB — CBC WITH DIFFERENTIAL/PLATELET
BASO%: 0.3 % (ref 0.0–2.0)
Basophils Absolute: 0 10*3/uL (ref 0.0–0.1)
EOS ABS: 0.1 10*3/uL (ref 0.0–0.5)
EOS%: 3 % (ref 0.0–7.0)
HEMATOCRIT: 21.1 % — AB (ref 38.4–49.9)
HEMOGLOBIN: 6.8 g/dL — AB (ref 13.0–17.1)
LYMPH#: 0.8 10*3/uL — AB (ref 0.9–3.3)
LYMPH%: 25.8 % (ref 14.0–49.0)
MCH: 32.5 pg (ref 27.2–33.4)
MCHC: 32.2 g/dL (ref 32.0–36.0)
MCV: 101 fL — AB (ref 79.3–98.0)
MONO#: 0.5 10*3/uL (ref 0.1–0.9)
MONO%: 17.7 % — ABNORMAL HIGH (ref 0.0–14.0)
NEUT%: 53.2 % (ref 39.0–75.0)
NEUTROS ABS: 1.6 10*3/uL (ref 1.5–6.5)
PLATELETS: 118 10*3/uL — AB (ref 140–400)
RBC: 2.09 10*6/uL — ABNORMAL LOW (ref 4.20–5.82)
RDW: 15.6 % — AB (ref 11.0–14.6)
WBC: 3 10*3/uL — AB (ref 4.0–10.3)

## 2015-11-06 LAB — PREPARE RBC (CROSSMATCH)

## 2015-11-06 LAB — MAGNESIUM: Magnesium: 1.9 mg/dl (ref 1.5–2.5)

## 2015-11-06 MED ORDER — SODIUM CHLORIDE 0.9% FLUSH
3.0000 mL | INTRAVENOUS | Status: DC | PRN
  Filled 2015-11-06: qty 10

## 2015-11-06 MED ORDER — SODIUM CHLORIDE 0.9 % IJ SOLN
10.0000 mL | INTRAMUSCULAR | Status: DC | PRN
  Administered 2015-11-06: 10 mL via INTRAVENOUS
  Filled 2015-11-06: qty 10

## 2015-11-06 MED ORDER — SODIUM CHLORIDE 0.9% FLUSH
10.0000 mL | INTRAVENOUS | Status: AC | PRN
  Administered 2015-11-06: 10 mL
  Filled 2015-11-06: qty 10

## 2015-11-06 MED ORDER — SODIUM CHLORIDE 0.9 % IV SOLN
250.0000 mL | Freq: Once | INTRAVENOUS | Status: AC
  Administered 2015-11-06: 250 mL via INTRAVENOUS

## 2015-11-06 MED ORDER — HEPARIN SOD (PORK) LOCK FLUSH 100 UNIT/ML IV SOLN
250.0000 [IU] | INTRAVENOUS | Status: DC | PRN
  Filled 2015-11-06: qty 5

## 2015-11-06 MED ORDER — HEPARIN SOD (PORK) LOCK FLUSH 100 UNIT/ML IV SOLN
500.0000 [IU] | Freq: Every day | INTRAVENOUS | Status: AC | PRN
  Administered 2015-11-06: 500 [IU]
  Filled 2015-11-06: qty 5

## 2015-11-06 NOTE — Patient Instructions (Signed)

## 2015-11-06 NOTE — Progress Notes (Signed)
[place] OFFICE PROGRESS NOTE   DIAGNOSIS: Extensive stage (T3, N3, M1b) small cell lung cancer presented with large right middle and lower lobe mass in addition to extensive mediastinal lymphadenopathy, right pleural effusion as well as extensive liver metastasis and retroperitoneal lymphadenopathy diagnosed in April 2017.  PRIOR THERAPY:  1) Systemic chemotherapy with reduced dose carboplatin for AUC of 3 on day 1 and etoposide 60 MG/M2 on days 1, 2 and 3. First dose was given at [facility]. Status post 4 cycles. Starting from cycle #3 his carboplatin is for AUC of 5 and etoposide 100 MG/M2. Last dose of chemotherapy was given on 08/04/2015 discontinued secondary to disease progression. 2) prophylactic cranial irradiation as well as palliative radiotherapy to the chest under the care of Dr. Tammi Klippel completed 10/03/2015.  CURRENT THERAPY: Systemic chemotherapy with cisplatin 30 MG/M2 and irinotecan 65 MG/M2 on days 1 and 8 every 3 weeks beginning 10/17/2015.  INTERVAL HISTORY:   Mr. Brow returns as scheduled. He completed cycle 1 cisplatin/irinotecan beginning 10/17/2015. He completed the day 8 treatment on 10/24/2015. He had significant nausea following the chemotherapy. The nausea is better at present. His appetite has improved. He had diarrhea as well. He is now having constipation. He denies dysphagia. No mouth sores. He has occasional abdominal discomfort. No shortness of breath, cough, fever. Main complaint is being fatigued. We discussed that his hemoglobin is low. He denies any bleeding. No dark stools.  Objective:  Vital signs in last 24 hours:  Blood pressure (!) 130/56, pulse 73, temperature 97.9 F (36.6 C), temperature source Oral, resp. rate 17, height '5\' 9"'$  (1.753 m), weight 138 lb 8 oz (62.8 kg), SpO2 99 %.    HEENT: No thrush or ulcers. Lymphatics: No palpable cervical or supraclavicular lymph nodes. Resp: Breath sounds diminished at the  right lower lung field. No respiratory distress. Cardio: Regular rate and rhythm. GI: Abdomen soft and nontender. No hepatomegaly. Vascular: No leg edema. Skin: No rash. Port-A-Cath without erythema.    Lab Results:  Lab Results  Component Value Date   WBC 3.0 (L) 11/06/2015   HGB 6.8 (LL) 11/06/2015   HCT 21.1 (L) 11/06/2015   MCV 101.0 (H) 11/06/2015   PLT 118 (L) 11/06/2015   NEUTROABS 1.6 11/06/2015    Imaging:  No results found.  Medications: I have reviewed the patient's current medications.  Assessment/Plan: 1. Extensive stage small cell lung cancer status post 4 cycles of chemotherapy with carboplatin and etoposide discontinued due to disease progression. He completed prophylactic cranial radiation as well as palliative radiation to the chest. He began treatment with cisplatin/irinotecan on a day one/day 8 schedule every 3 weeks on 10/17/2015. 2. Anemia, progressive. 3. Weight loss.   Disposition: Mr. Whitter has completed 1 cycle of cisplatin/irinotecan. He had significant nausea as well as some diarrhea. He has lost about 12 pounds over the past 3 weeks. White count is borderline and creatinine is mildly elevated. He has progressive anemia and appears to be symptomatic. We decided to hold today's chemotherapy for one week. He will receive a blood transfusion today.  He will return for a follow-up visit and cycle 2 cisplatin/irinotecan 11/13/2015. He will contact the office in the interim with any problems.  Plan reviewed with Dr. Benay Spice in Dr. Worthy Flank absence. 25 minutes were spent face-to-face at today's visit with the majority of that time involved in counseling/coordination of care.    Ned Card ANP/GNP-BC   11/06/2015  9:03 AM

## 2015-11-06 NOTE — Patient Instructions (Signed)

## 2015-11-06 NOTE — Telephone Encounter (Signed)
No los 9/21. Patient currently has additional cycles of tx on schedule.

## 2015-11-07 LAB — TYPE AND SCREEN
ABO/RH(D): O POS
Antibody Screen: NEGATIVE
Unit division: 0
Unit division: 0

## 2015-11-13 ENCOUNTER — Encounter: Payer: Self-pay | Admitting: Nurse Practitioner

## 2015-11-13 ENCOUNTER — Ambulatory Visit (HOSPITAL_BASED_OUTPATIENT_CLINIC_OR_DEPARTMENT_OTHER): Payer: Medicare Other

## 2015-11-13 ENCOUNTER — Telehealth: Payer: Self-pay | Admitting: *Deleted

## 2015-11-13 ENCOUNTER — Other Ambulatory Visit (HOSPITAL_BASED_OUTPATIENT_CLINIC_OR_DEPARTMENT_OTHER): Payer: Medicare Other

## 2015-11-13 ENCOUNTER — Ambulatory Visit (HOSPITAL_BASED_OUTPATIENT_CLINIC_OR_DEPARTMENT_OTHER): Payer: Medicare Other | Admitting: Nurse Practitioner

## 2015-11-13 ENCOUNTER — Ambulatory Visit: Payer: Medicare Other

## 2015-11-13 ENCOUNTER — Other Ambulatory Visit: Payer: Self-pay | Admitting: Nurse Practitioner

## 2015-11-13 VITALS — BP 143/81 | HR 70 | Temp 98.2°F | Resp 17 | Ht 69.0 in | Wt 139.3 lb

## 2015-11-13 DIAGNOSIS — C3491 Malignant neoplasm of unspecified part of right bronchus or lung: Secondary | ICD-10-CM

## 2015-11-13 DIAGNOSIS — C3431 Malignant neoplasm of lower lobe, right bronchus or lung: Secondary | ICD-10-CM | POA: Diagnosis not present

## 2015-11-13 DIAGNOSIS — D6959 Other secondary thrombocytopenia: Secondary | ICD-10-CM

## 2015-11-13 DIAGNOSIS — C342 Malignant neoplasm of middle lobe, bronchus or lung: Secondary | ICD-10-CM

## 2015-11-13 DIAGNOSIS — Z5111 Encounter for antineoplastic chemotherapy: Secondary | ICD-10-CM

## 2015-11-13 DIAGNOSIS — Z95828 Presence of other vascular implants and grafts: Secondary | ICD-10-CM

## 2015-11-13 DIAGNOSIS — C3490 Malignant neoplasm of unspecified part of unspecified bronchus or lung: Secondary | ICD-10-CM

## 2015-11-13 DIAGNOSIS — T451X5A Adverse effect of antineoplastic and immunosuppressive drugs, initial encounter: Secondary | ICD-10-CM

## 2015-11-13 DIAGNOSIS — C787 Secondary malignant neoplasm of liver and intrahepatic bile duct: Secondary | ICD-10-CM | POA: Diagnosis not present

## 2015-11-13 DIAGNOSIS — R634 Abnormal weight loss: Secondary | ICD-10-CM | POA: Diagnosis not present

## 2015-11-13 DIAGNOSIS — C786 Secondary malignant neoplasm of retroperitoneum and peritoneum: Secondary | ICD-10-CM | POA: Diagnosis not present

## 2015-11-13 LAB — CBC WITH DIFFERENTIAL/PLATELET
BASO%: 0.5 % (ref 0.0–2.0)
Basophils Absolute: 0 10*3/uL (ref 0.0–0.1)
EOS ABS: 0 10*3/uL (ref 0.0–0.5)
EOS%: 1.4 % (ref 0.0–7.0)
HEMATOCRIT: 30.4 % — AB (ref 38.4–49.9)
HGB: 9.9 g/dL — ABNORMAL LOW (ref 13.0–17.1)
LYMPH#: 0.5 10*3/uL — AB (ref 0.9–3.3)
LYMPH%: 18 % (ref 14.0–49.0)
MCH: 32.2 pg (ref 27.2–33.4)
MCHC: 32.8 g/dL (ref 32.0–36.0)
MCV: 98.2 fL — AB (ref 79.3–98.0)
MONO#: 0.9 10*3/uL (ref 0.1–0.9)
MONO%: 31.1 % — ABNORMAL HIGH (ref 0.0–14.0)
NEUT%: 49 % (ref 39.0–75.0)
NEUTROS ABS: 1.5 10*3/uL (ref 1.5–6.5)
PLATELETS: 92 10*3/uL — AB (ref 140–400)
RBC: 3.09 10*6/uL — AB (ref 4.20–5.82)
RDW: 15.9 % — ABNORMAL HIGH (ref 11.0–14.6)
WBC: 3 10*3/uL — ABNORMAL LOW (ref 4.0–10.3)

## 2015-11-13 LAB — COMPREHENSIVE METABOLIC PANEL
ALT: 16 U/L (ref 0–55)
ANION GAP: 8 meq/L (ref 3–11)
AST: 31 U/L (ref 5–34)
Albumin: 2.8 g/dL — ABNORMAL LOW (ref 3.5–5.0)
Alkaline Phosphatase: 205 U/L — ABNORMAL HIGH (ref 40–150)
BUN: 14.1 mg/dL (ref 7.0–26.0)
CO2: 22 meq/L (ref 22–29)
Calcium: 9 mg/dL (ref 8.4–10.4)
Chloride: 108 mEq/L (ref 98–109)
Creatinine: 1 mg/dL (ref 0.7–1.3)
EGFR: 76 mL/min/{1.73_m2} — AB (ref >90)
GLUCOSE: 103 mg/dL (ref 70–140)
Potassium: 4 mEq/L (ref 3.5–5.1)
Sodium: 138 mEq/L (ref 136–145)
TOTAL PROTEIN: 6.8 g/dL (ref 6.4–8.3)

## 2015-11-13 LAB — MAGNESIUM: MAGNESIUM: 1.9 mg/dL (ref 1.5–2.5)

## 2015-11-13 MED ORDER — SODIUM CHLORIDE 0.9 % IV SOLN
Freq: Once | INTRAVENOUS | Status: AC
  Administered 2015-11-13: 12:00:00 via INTRAVENOUS

## 2015-11-13 MED ORDER — SODIUM CHLORIDE 0.9 % IV SOLN
30.0000 mg/m2 | Freq: Once | INTRAVENOUS | Status: AC
  Administered 2015-11-13: 54 mg via INTRAVENOUS
  Filled 2015-11-13: qty 54

## 2015-11-13 MED ORDER — IRINOTECAN HCL CHEMO INJECTION 100 MG/5ML
65.0000 mg/m2 | Freq: Once | INTRAVENOUS | Status: AC
  Administered 2015-11-13: 120 mg via INTRAVENOUS
  Filled 2015-11-13: qty 5

## 2015-11-13 MED ORDER — SODIUM CHLORIDE 0.9 % IV SOLN
Freq: Once | INTRAVENOUS | Status: AC
  Administered 2015-11-13: 12:00:00 via INTRAVENOUS
  Filled 2015-11-13: qty 5

## 2015-11-13 MED ORDER — HEPARIN SOD (PORK) LOCK FLUSH 100 UNIT/ML IV SOLN
500.0000 [IU] | Freq: Once | INTRAVENOUS | Status: AC | PRN
  Administered 2015-11-13: 500 [IU]
  Filled 2015-11-13: qty 5

## 2015-11-13 MED ORDER — MIRTAZAPINE 15 MG PO TABS: 15.0000 mg | ORAL_TABLET | Freq: Every day | ORAL | 3 refills | Status: DC

## 2015-11-13 MED ORDER — SODIUM CHLORIDE 0.9% FLUSH
10.0000 mL | INTRAVENOUS | Status: DC | PRN
  Administered 2015-11-13: 10 mL
  Filled 2015-11-13: qty 10

## 2015-11-13 MED ORDER — PALONOSETRON HCL INJECTION 0.25 MG/5ML
0.2500 mg | Freq: Once | INTRAVENOUS | Status: AC
  Administered 2015-11-13: 0.25 mg via INTRAVENOUS

## 2015-11-13 MED ORDER — SODIUM CHLORIDE 0.9 % IJ SOLN
10.0000 mL | INTRAMUSCULAR | Status: DC | PRN
  Administered 2015-11-13: 10 mL via INTRAVENOUS
  Filled 2015-11-13: qty 10

## 2015-11-13 MED ORDER — PALONOSETRON HCL INJECTION 0.25 MG/5ML
INTRAVENOUS | Status: AC
  Filled 2015-11-13: qty 5

## 2015-11-13 MED ORDER — POTASSIUM CHLORIDE 2 MEQ/ML IV SOLN
Freq: Once | INTRAVENOUS | Status: AC
  Administered 2015-11-13: 10:00:00 via INTRAVENOUS
  Filled 2015-11-13: qty 10

## 2015-11-13 MED ORDER — OXYCODONE HCL 5 MG PO TABS
ORAL_TABLET | ORAL | 0 refills | Status: DC
Start: 2015-11-13 — End: 2015-12-23

## 2015-11-13 MED ORDER — ATROPINE SULFATE 1 MG/ML IJ SOLN
0.5000 mg | Freq: Once | INTRAMUSCULAR | Status: AC | PRN
  Administered 2015-11-13: 0.5 mg via INTRAVENOUS

## 2015-11-13 MED ORDER — ATROPINE SULFATE 1 MG/ML IJ SOLN
INTRAMUSCULAR | Status: AC
  Filled 2015-11-13: qty 1

## 2015-11-13 NOTE — Assessment & Plan Note (Signed)
Patient missed his chemotherapy last week secondary to hemoglobin down to 6.8.  He instead received a blood transfusion last week; and states that he felt much better today.  He denies any increased fatigue or shortness of breath with exertion.  Blood counts obtained today reveal a WBC of 3.0, ANC 1.5, hemoglobin has improved from 6.8 up to 9.5 after blood transfusion last week; and platelet count has dropped from 118 down to 92.  Patient denies any new issues with either easy bleeding or bruising.  Reviewed all findings with Dr. Julien Nordmann; and he advised the patient could proceed today with his chemotherapy as planned.  Since patient missed his chemotherapy last week-will need to change patient's schedule.  Patient will need labs and chemotherapy on Thursday, 11/20/2015 for cycle 2, day 8 of his chemotherapy regimen.  He will need a CT with contrast of the chest, abdomen, and pelvis.  A few days prior to returning on 12/04/2015 for cycle 3, day 1 of his chemotherapy regimen.  He will require labs, visit, and his chemotherapy on 12/04/2015.  Will send note to schedule her to arrange.  Also, will cancel all appointments for 11/27/2015.  Note: Patient asked if it was okay for him to undergo a gentle cleaning per his dentist.  He also has a small cavity that he was going to have failed; but states that it does not bother him at this time.  Dr. Julien Nordmann recommended that he hold on having any cavities filled if at all possible.  He was encouraged to have the cavity filled if it does become a discomfort to him.

## 2015-11-13 NOTE — Assessment & Plan Note (Signed)
Patient was suffering with nausea, vomiting, and bouts of diarrhea last week; and lost approximately 12 pounds with his last weight check.  He also had been complaining of minimal appetite as well.  Patient is happy to report that all nausea, vomiting, and diarrhea have essentially resolved at this point.  He also states his appetite is returning.  He is both eating and drinking well.  He has gained some of this weight back as well.  Patient was advised to continue to eat multiple small meals throughout the day and to push protein of possible.

## 2015-11-13 NOTE — Progress Notes (Signed)
OK to treat with PLT level today Per Beth RN with Selena Lesser NP

## 2015-11-13 NOTE — Progress Notes (Signed)
Established patient visit  HPI:  Shane Vasquez 65 y.o. male diagnosed with lung cancer with liver metastasis and peritoneal lymphadenopathy.  Patient is status post prophylactic brain irradiation and palliative radiation to his chest, which was completed in August 2017.  Currently undergoing cisplatin/irinotecan chemotherapy regimen.   Oncology History   Patient presented to ED with worsening abdominal pain.  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 Right lower lobe nodular interstitial thickening,, right upper lobe nodules, right pleural nodules with small right pleural effusion, enlarg      05/19/2015 Procedure    US Thoracentesis IMPRESSION: Successful ultrasound guided right thoracentesis yielding 830 mL of pleural fluid.      05/20/2015 Pathology Results    1. Bronchus, biopsy, Lung Bronchial Brush SMALL CELL CARCINOMA OF THE LUNG 2. 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  Negative for metastatic disease to the brain.        Review of Systems  All other systems reviewed and are negative.   Past Medical History:  Diagnosis Date  . CAP (community acquired pneumonia)    LLL 07/2014  . CERVICAL RADICULOPATHY, RIGHT 08/12/2009  . DIVERTICULOSIS, COLON 08/15/2006  . Empyema lung ([place])    2016 on left  . GERD 08/15/2006  . HYPERLIPIDEMIA 08/15/2006  .  HYPERTENSION 08/15/2006  . INTERNAL HEMORRHOIDS 02/29/2008  . Lung cancer ([place])    metastatic small cell lung cancer  . SKIN CANCER, HX OF 08/15/2006    Past Surgical History:  Procedure Laterality Date  . TONSILLECTOMY    . VIDEO ASSISTED THORACOSCOPY (VATS)/EMPYEMA Left 07/27/2014   Procedure: VIDEO ASSISTED THORACOSCOPY (VATS)/EMPYEMA Left, drainage pleural effusion.;  Surgeon: [name], MD;  Location: [facility];  Service: Thoracic;  Laterality: Left;  Marland Kitchen VIDEO BRONCHOSCOPY N/A 07/27/2014   Procedure: VIDEO BRONCHOSCOPY;  Surgeon: [name], MD;  Location: [facility] OR;  Service: Thoracic;  Laterality: N/A;  . 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    is allergic to bee venom.    Medication List       Accurate as of 11/13/15 11:51 AM. Always use your most recent med list.  acetaminophen 500 MG tablet Commonly known as:  TYLENOL Take 1,000 mg by mouth every 8 (eight) hours as needed.   albuterol 108 (90 Base) MCG/ACT inhaler Commonly known as:  PROVENTIL HFA;VENTOLIN HFA Inhale 1-2 puffs into the lungs every 6 (six) hours as needed for wheezing or shortness of breath.   chlorproMAZINE  25 MG tablet Commonly known as:  THORAZINE Take 1 tablet (25 mg total) by mouth 3 (three) times daily as needed. for  hiccoughs   diphenhydramine-acetaminophen 25-500 MG Tabs tablet Commonly known as:  TYLENOL PM Take 1 tablet by mouth at bedtime as needed.   doxycycline 100 MG tablet Commonly known as:  VIBRA-TABS Take 1 tablet (100 mg total) by mouth 2 (two) times daily.   EPINEPHrine 0.3 mg/0.3 mL Soaj injection Commonly known as:  EPI-PEN Inject 0.3 mLs (0.3 mg total) into the muscle once.   lidocaine-prilocaine cream Commonly known as:  EMLA Apply to port -a-cath 1-2 hours prior to access.   mirtazapine 15 MG tablet Commonly known as:  REMERON Take 1 tablet (15 mg total) by mouth at bedtime.   ondansetron 8 MG tablet Commonly known as:  ZOFRAN Take 1 tablet (8 mg total) by mouth every 8 (eight) hours as needed for nausea or vomiting.   oxyCODONE 5 MG immediate release tablet Commonly known as:  Oxy IR/ROXICODONE Take 1-2 tabs PO Q 4-6 hours PRN pain.   oxymetazoline 0.05 % nasal spray Commonly known as:  AFRIN Place 1 spray into both nostrils 2 (two) times daily.   polyethylene glycol packet Commonly known as:  MIRALAX / GLYCOLAX Take 17 g by mouth daily as needed for mild constipation.   prochlorperazine 10 MG tablet Commonly known as:  COMPAZINE Take 1 tablet (10 mg total) by mouth every 6 (six) hours as needed for nausea or vomiting. DO  NOT  TAKE  WITH  THORAZINE.   senna 8.6 MG Tabs tablet Commonly known as:  SENOKOT Take 1 tablet (8.6 mg total) by mouth daily.   SONAFINE EX Apply topically.        PHYSICAL EXAMINATION  Oncology Vitals 11/13/2015 11/06/2015  Height 175 cm -  Weight 63.186 kg -  Weight (lbs) 139 lbs 5 oz -  BMI (kg/m2) 20.57 kg/m2 -  Temp 98.2 98.5  Pulse 70 75  Resp 17 18  SpO2 99 97  BSA (m2) 1.75 m2 -   BP Readings from Last 2 Encounters:  11/13/15 (!) 143/81  11/06/15 (!) 142/80    Physical Exam  Constitutional: He  is oriented to person, place, and time. Vital signs are normal. He appears unhealthy.  HENT:  Head: Normocephalic and atraumatic.  Eyes: Conjunctivae and EOM are normal. Pupils are equal, round, and reactive to light.  Neck: Normal range of motion.  Pulmonary/Chest: Effort normal. No respiratory distress.  Musculoskeletal: Normal range of motion. He exhibits no edema.  Neurological: He is alert and oriented to person, place, and time. Gait normal.  Skin: Skin is warm and dry.  Psychiatric: Affect normal.  Nursing note and vitals reviewed.   LABORATORY DATA:. Appointment on 11/13/2015  Component Date Value Ref Range Status  . Magnesium 11/13/2015 1.9  1.5 - 2.5 mg/dl Final  . WBC 11/13/2015 3.0* 4.0 - 10.3 10e3/uL Final  . NEUT# 11/13/2015 1.5  1.5 - 6.5 10e3/uL Final  . HGB 11/13/2015 9.9* 13.0 - 17.1 g/dL Final  . HCT 11/13/2015 30.4* 38.4 - 49.9 % Final  . Platelets 11/13/2015 92* 140 - 400  10e3/uL Final  . MCV 11/13/2015 98.2* 79.3 - 98.0 fL Final  . MCH 11/13/2015 32.2  27.2 - 33.4 pg Final  . MCHC 11/13/2015 32.8  32.0 - 36.0 g/dL Final  . RBC 11/13/2015 3.09* 4.20 - 5.82 10e6/uL Final  . RDW 11/13/2015 15.9* 11.0 - 14.6 % Final  . lymph# 11/13/2015 0.5* 0.9 - 3.3 10e3/uL Final  . MONO# 11/13/2015 0.9  0.1 - 0.9 10e3/uL Final  . Eosinophils Absolute 11/13/2015 0.0  0.0 - 0.5 10e3/uL Final  . Basophils Absolute 11/13/2015 0.0  0.0 - 0.1 10e3/uL Final  . NEUT% 11/13/2015 49.0  39.0 - 75.0 % Final  . LYMPH% 11/13/2015 18.0  14.0 - 49.0 % Final  . MONO% 11/13/2015 31.1* 0.0 - 14.0 % Final  . EOS% 11/13/2015 1.4  0.0 - 7.0 % Final  . BASO% 11/13/2015 0.5  0.0 - 2.0 % Final  . Sodium 11/13/2015 138  136 - 145 mEq/L Final  . Potassium 11/13/2015 4.0  3.5 - 5.1 mEq/L Final  . Chloride 11/13/2015 108  98 - 109 mEq/L Final  . CO2 11/13/2015 22  22 - 29 mEq/L Final  . Glucose 11/13/2015 103  70 - 140 mg/dl Final  . BUN 11/13/2015 14.1  7.0 - 26.0 mg/dL Final  . Creatinine  11/13/2015 1.0  0.7 - 1.3 mg/dL Final  . Total Bilirubin 11/13/2015 <0.30  0.20 - 1.20 mg/dL Final  . Alkaline Phosphatase 11/13/2015 205* 40 - 150 U/L Final  . AST 11/13/2015 31  5 - 34 U/L Final  . ALT 11/13/2015 16  0 - 55 U/L Final  . Total Protein 11/13/2015 6.8  6.4 - 8.3 g/dL Final  . Albumin 11/13/2015 2.8* 3.5 - 5.0 g/dL Final  . Calcium 11/13/2015 9.0  8.4 - 10.4 mg/dL Final  . Anion Gap 11/13/2015 8  3 - 11 mEq/L Final  . EGFR 11/13/2015 76* >90 ml/min/1.73 m2 Final    RADIOGRAPHIC STUDIES: No results found.  ASSESSMENT/PLAN:    Unintentional weight loss Patient was suffering with nausea, vomiting, and bouts of diarrhea last week; and lost approximately 12 pounds with his last weight check.  He also had been complaining of minimal appetite as well.  Patient is happy to report that all nausea, vomiting, and diarrhea have essentially resolved at this point.  He also states his appetite is returning.  He is both eating and drinking well.  He has gained some of this weight back as well.  Patient was advised to continue to eat multiple small meals throughout the day and to push protein of possible.  Small cell carcinoma of lung ([place]) Patient missed his chemotherapy last week secondary to hemoglobin down to 6.8.  He instead received a blood transfusion last week; and states that he felt much better today.  He denies any increased fatigue or shortness of breath with exertion.  Blood counts obtained today reveal a WBC of 3.0, ANC 1.5, hemoglobin has improved from 6.8 up to 9.5 after blood transfusion last week; and platelet count has dropped from 118 down to 92.  Patient denies any new issues with either easy bleeding or bruising.  Reviewed all findings with Dr. Julien Nordmann; and he advised the patient could proceed today with his chemotherapy as planned.  Since patient missed his chemotherapy last week-will need to change patient's schedule.  Patient will need labs and chemotherapy on  Thursday, 11/20/2015 for cycle 2, day 8 of his chemotherapy regimen.  He will need a CT with contrast  of the chest, abdomen, and pelvis.  A few days prior to returning on 12/04/2015 for cycle 3, day 1 of his chemotherapy regimen.  He will require labs, visit, and his chemotherapy on 12/04/2015.  Will send note to schedule her to arrange.  Also, will cancel all appointments for 11/27/2015.  Note: Patient asked if it was okay for him to undergo a gentle cleaning per his dentist.  He also has a small cavity that he was going to have failed; but states that it does not bother him at this time.  Dr. Julien Nordmann recommended that he hold on having any cavities filled if at all possible.  He was encouraged to have the cavity filled if it does become a discomfort to him.  Chemotherapy induced thrombocytopenia Patient missed his chemotherapy last week secondary to hemoglobin down to 6.8.  He instead received a blood transfusion last week; and states that he felt much better today.  He denies any increased fatigue or shortness of breath with exertion.  Blood counts obtained today reveal a WBC of 3.0, ANC 1.5, hemoglobin has improved from 6.8 up to 9.5 after blood transfusion last week; and platelet count has dropped from 118 down to 92.  Patient denies any new issues with either easy bleeding or bruising.  Reviewed all findings with Dr. Julien Nordmann; and he advised the patient could proceed today with his chemotherapy as planned.  Since patient missed his chemotherapy last week-will need to change patient's schedule.  Patient will need labs and chemotherapy on Thursday, 11/20/2015 for cycle 2, day 8 of his chemotherapy regimen.  He will need a CT with contrast of the chest, abdomen, and pelvis.  A few days prior to returning on 12/04/2015 for cycle 3, day 1 of his chemotherapy regimen.  He will require labs, visit, and his chemotherapy on 12/04/2015.  Will send note to schedule her to arrange.  Also, will cancel all  appointments for 11/27/2015.  Note: Patient asked if it was okay for him to undergo a gentle cleaning per his dentist.  He also has a small cavity that he was going to have failed; but states that it does not bother him at this time.  Dr. Julien Nordmann recommended that he hold on having any cavities filled if at all possible.  He was encouraged to have the cavity filled if it does become a discomfort to him.   Patient stated understanding of all instructions; and was in agreement with this plan of care. The patient knows to call the clinic with any problems, questions or concerns.   Total time spent with patient was 25 minutes;  with greater than 75 percent of that time spent in face to face counseling regarding patient's symptoms,  and coordination of care and follow up.  Disclaimer:This dictation was prepared with Dragon/digital dictation along with [employer]. Any transcriptional errors that result from this process are unintentional.  Drue Second, NP 11/13/2015   ADDENDUM: Hematology/Oncology Attending: I had a face to face encounter with the patient. I recommended his care plan. This is a very pleasant 65 years old white male with extensive stage small cell lung cancer, currently undergoing second line systemic chemotherapy with reduced dose cisplatin and irinotecan status post 1 cycle. He is tolerating his treatment well except for pancytopenia. He missed his treatment last week. He continues to have mild thrombocytopenia but I think it is good enough to proceed with cycle #2 today. I will see him back for follow-up visit in 3 weeks  for reevaluation with repeat CT scan of the chest, abdomen and pelvis for restaging of his disease. The patient was advised to call immediately if he has any concerning symptoms in the interval.  Disclaimer: This note was dictated with voice recognition software. Similar sounding words can inadvertently be transcribed and may be missed upon  review. Eilleen Kempf., MD 11/15/15

## 2015-11-13 NOTE — Assessment & Plan Note (Addendum)
Patient missed his chemotherapy last week secondary to hemoglobin down to 6.8.  He instead received a blood transfusion last week; and states that he felt much better today.  He denies any increased fatigue or shortness of breath with exertion.  Blood counts obtained today reveal a WBC of 3.0, ANC 1.5, hemoglobin has improved from 6.8 up to 9.5 after blood transfusion last week; and platelet count has dropped from 118 down to 92.  Patient denies any new issues with either easy bleeding or bruising.  Reviewed all findings with Dr. Julien Nordmann; and he advised the patient could proceed today with his chemotherapy as planned.  Since patient missed his chemotherapy last week-will need to change patient's schedule.  Patient will need labs and chemotherapy on Thursday, 11/20/2015 for cycle 2, day 8 of his chemotherapy regimen.  He will need a CT with contrast of the chest, abdomen, and pelvis.  A few days prior to returning on 12/04/2015 for cycle 3, day 1 of his chemotherapy regimen.  He will require labs, visit, and his chemotherapy on 12/04/2015.  Will send note to schedule her to arrange.  Also, will cancel all appointments for 11/27/2015.  Note: Patient asked if it was okay for him to undergo a gentle cleaning per his dentist.  He also has a small cavity that he was going to have failed; but states that it does not bother him at this time.  Dr. Julien Nordmann recommended that he hold on having any cavities filled if at all possible.  He was encouraged to have the cavity filled if it does become a discomfort to him.

## 2015-11-13 NOTE — Patient Instructions (Signed)
[facility] Discharge Instructions for Patients Receiving Chemotherapy  Today you received the following chemotherapy agents Irinotecan and Cisplatin. To help prevent nausea and vomiting after your treatment, we encourage you to take your nausea medication as directed. NO ZOFRAN FOR 3 DAYS.  If you develop nausea and vomiting that is not controlled by your nausea medication, call the clinic.   BELOW ARE SYMPTOMS THAT SHOULD BE REPORTED IMMEDIATELY:  *FEVER GREATER THAN 100.5 F  *CHILLS WITH OR WITHOUT FEVER  NAUSEA AND VOMITING THAT IS NOT CONTROLLED WITH YOUR NAUSEA MEDICATION  *UNUSUAL SHORTNESS OF BREATH  *UNUSUAL BRUISING OR BLEEDING  TENDERNESS IN MOUTH AND THROAT WITH OR WITHOUT PRESENCE OF ULCERS  *URINARY PROBLEMS  *BOWEL PROBLEMS  UNUSUAL RASH Items with * indicate a potential emergency and should be followed up as soon as possible.  Feel free to call the clinic you have any questions or concerns. The clinic phone number is [phone].  Please show the [facility] at check-in to the Emergency Department and triage nurse.

## 2015-11-13 NOTE — Progress Notes (Signed)
Dr. Earlie Server aware of urine output up to this point, he stated we may move forward with treatment now.

## 2015-11-13 NOTE — Telephone Encounter (Signed)
Per LOS I have scheduled appt, no available on 10/5 moved to 10/6. Notified the scheduler

## 2015-11-14 ENCOUNTER — Other Ambulatory Visit: Payer: Self-pay | Admitting: Medical Oncology

## 2015-11-18 ENCOUNTER — Ambulatory Visit
Admission: RE | Admit: 2015-11-18 | Discharge: 2015-11-18 | Disposition: A | Discharge status: Home / Self Care | Payer: Medicare Other | Source: Ambulatory Visit | Attending: Radiation Oncology | Admitting: Radiation Oncology

## 2015-11-18 ENCOUNTER — Encounter: Payer: Self-pay | Admitting: Radiation Oncology

## 2015-11-18 VITALS — BP 134/76 | HR 86 | Temp 98.5°F | Resp 16 | Ht 69.0 in | Wt 138.2 lb

## 2015-11-18 DIAGNOSIS — R7989 Other specified abnormal findings of blood chemistry: Secondary | ICD-10-CM

## 2015-11-18 DIAGNOSIS — Z923 Personal history of irradiation: Secondary | ICD-10-CM | POA: Insufficient documentation

## 2015-11-18 DIAGNOSIS — C3490 Malignant neoplasm of unspecified part of unspecified bronchus or lung: Secondary | ICD-10-CM

## 2015-11-18 DIAGNOSIS — R1310 Dysphagia, unspecified: Secondary | ICD-10-CM | POA: Diagnosis not present

## 2015-11-18 DIAGNOSIS — C3431 Malignant neoplasm of lower lobe, right bronchus or lung: Secondary | ICD-10-CM | POA: Insufficient documentation

## 2015-11-18 DIAGNOSIS — R945 Abnormal results of liver function studies: Secondary | ICD-10-CM

## 2015-11-18 DIAGNOSIS — C3491 Malignant neoplasm of unspecified part of right bronchus or lung: Secondary | ICD-10-CM

## 2015-11-18 DIAGNOSIS — K208 Other esophagitis: Secondary | ICD-10-CM | POA: Diagnosis not present

## 2015-11-18 DIAGNOSIS — Z91030 Bee allergy status: Secondary | ICD-10-CM | POA: Diagnosis not present

## 2015-11-18 NOTE — Progress Notes (Signed)
Shane Vasquez here for reassessment S/P XRT to his chest.  He denies any pain in his chest, but reports intermittent pain in his abdomen in the left lateral region as a level 4/10, but doesn't interfere with his ability to eat and denies any nausea nor vomiting.  To have CT chest on 12/01/15.   BP 134/76   Pulse 86   Temp 98.5 F (36.9 C) (Oral)   Resp 16   Ht '5\' 9"'$  (1.753 m)   Wt 138 lb 3.2 oz (62.7 kg)   SpO2 100%   BMI 20.41 kg/m     Wt Readings from Last 3 Encounters:  11/18/15 138 lb 3.2 oz (62.7 kg)  11/13/15 139 lb 4.8 oz (63.2 kg)  11/06/15 138 lb 8 oz (62.8 kg)

## 2015-11-20 ENCOUNTER — Other Ambulatory Visit: Payer: Medicare Other

## 2015-11-20 NOTE — Progress Notes (Signed)
Radiation Oncology         [phone] ________________________________  Name: Shane Vasquez MRN: [id number]  Date: 11/18/2015  DOB: Jul 01, 1950  Post Treatment Note  CC: [name], MD  Marletta Lor, MD  Diagnosis:   65 y.o.gentleman with extensive stage small cell lung cancer of the right middle and right lower lobe     Interval Since Last Radiation:  7 weeks   09/17/2015 to 10/03/2015:  1. The PCI whole brain was treated to 25 Gy in 10 fractions at 2.5 Gy per fraction.  2. The chest was treated to 30 Gy in 10 fractions at 3 Gy per fraction.   Narrative:  The patient returns today for routine follow-up.  Overall he reports he is doing better in the last two weeks with regard to his dysphagia. He denies any symptoms of this currently and did not take carafate for this. He denies shortness of breath or chest pain, fevers, chills, headaches, blurred vision, auditory disturbances, nausea, or vomiting. No other complaints are noted.                             ALLERGIES:  is allergic to bee venom.  Meds: Current Outpatient Prescriptions  Medication Sig Dispense Refill  . acetaminophen (TYLENOL) 500 MG tablet Take 1,000 mg by mouth every 8 (eight) hours as needed.    Marland Kitchen albuterol (PROVENTIL HFA;VENTOLIN HFA) 108 (90 Base) MCG/ACT inhaler Inhale 1-2 puffs into the lungs every 6 (six) hours as needed for wheezing or shortness of breath. 1 Inhaler 2  . diphenhydramine-acetaminophen (TYLENOL PM) 25-500 MG TABS tablet Take 1 tablet by mouth at bedtime as needed.    . lidocaine-prilocaine (EMLA) cream Apply to port -a-cath 1-2 hours prior to access. 30 g 2  . mirtazapine (REMERON) 15 MG tablet Take 1 tablet (15 mg total) by mouth at bedtime. 30 tablet 3  . ondansetron (ZOFRAN) 8 MG tablet Take 1 tablet (8 mg total) by mouth every 8 (eight) hours as needed for nausea or vomiting. 30 tablet 1  . oxyCODONE (OXY IR/ROXICODONE) 5 MG immediate release tablet Take 1-2 tabs PO Q 4-6  hours PRN pain. 60 tablet 0  . oxymetazoline (AFRIN) 0.05 % nasal spray Place 1 spray into both nostrils 2 (two) times daily.    . polyethylene glycol (MIRALAX / GLYCOLAX) packet Take 17 g by mouth daily as needed for mild constipation.    . prochlorperazine (COMPAZINE) 10 MG tablet Take 1 tablet (10 mg total) by mouth every 6 (six) hours as needed for nausea or vomiting. DO  NOT  TAKE  WITH  THORAZINE. 30 tablet 0  . chlorproMAZINE (THORAZINE) 25 MG tablet Take 1 tablet (25 mg total) by mouth 3 (three) times daily as needed. for  hiccoughs (Patient not taking: Reported on 11/18/2015) 30 tablet 0  . EPINEPHrine 0.3 mg/0.3 mL IJ SOAJ injection Inject 0.3 mLs (0.3 mg total) into the muscle once. (Patient not taking: Reported on 11/18/2015) 1 Device 2  . senna (SENOKOT) 8.6 MG TABS tablet Take 1 tablet (8.6 mg total) by mouth daily. (Patient not taking: Reported on 11/18/2015)  0   No current facility-administered medications for this encounter.    Facility-Administered Medications Ordered in Other Encounters  Medication Dose Route Frequency Provider Last Rate Last Dose  . 0.9 %  sodium chloride infusion   Intravenous Once Curt Bears, MD      . sodium chloride flush (  NS) 0.9 % injection 10 mL  10 mL Intracatheter PRN Curt Bears, MD   10 mL at 10/17/15 1829    Physical Findings:  height is '5\' 9"'$  (1.753 m) and weight is 138 lb 3.2 oz (62.7 kg). His oral temperature is 98.5 F (36.9 C). His blood pressure is 134/76 and his pulse is 86. His respiration is 16 and oxygen saturation is 100%.  In general this is a well appearing [place] male in no acute distress. He's alert and oriented x4 and appropriate throughout the examination. Cardiopulmonary assessment is negative for acute distress and he exhibits normal effort.   Lab Findings: Lab Results  Component Value Date   WBC 3.0 (L) 11/13/2015   HGB 9.9 (L) 11/13/2015   HCT 30.4 (L) 11/13/2015   MCV 98.2 (H) 11/13/2015   PLT 92 (L) 11/13/2015       Radiographic Findings: Ct Renal Stone Study  Result Date: 10/27/2015 CLINICAL DATA:  Right flank pain EXAM: CT ABDOMEN AND PELVIS WITHOUT CONTRAST TECHNIQUE: Multidetector CT imaging of the abdomen and pelvis was performed following the standard protocol without IV contrast. COMPARISON:  08/29/2015 FINDINGS: Lower chest: There is moderate right pleural effusion. There is pleural thickening in right base posteriorly. Findings are highly suspicious of malignant pleural effusion. Clinical correlation is necessary. There is atelectasis or infiltrate in right lower lobe posteriorly and right middle lobe. Hepatobiliary: Multiple low-density lesions are noted within liver consistent with significant progression of metastatic disease. No calcified gallstones are noted within gallbladder. Pancreas: Unenhanced pancreas is unremarkable. Spleen: Unenhanced spleen is unremarkable. Adrenals/Urinary Tract: No adrenal gland mass is noted. There is mild right hydronephrosis and right hydroureter. Nonobstructive calculus in lower pole of the right kidney measures 5 mm. Axial image 69 there is 3 mm calcified obstructive calculus in distal right ureter about 1.5 cm from right UVJ. There is no left hydronephrosis. No left ureteral calculi are noted. No calcified calculi are noted within under distended urinary bladder. Stomach/Bowel: No small bowel obstruction. No thickened or dilated small bowel loops. The terminal ileum is unremarkable. There is a low lying cecum. The appendix is not identified. Moderate stool noted within redundant transverse colon. Scattered diverticula are noted descending colon and proximal sigmoid colon. Axial image 56 there is mild stranding of pericolonic fat in left lower quadrant. Mild diverticulitis cannot be excluded. No pericolonic abscess or perforation. Vascular/Lymphatic: No aortic aneurysm. Atherosclerotic calcifications of abdominal aorta and iliac arteries are noted. There is a pathologic  right retroperitoneal lymph node just posterior to IVC axial image 23 measures 2.1 cm. Metastatic disease is highly suspected. Reproductive: Prostate gland is unremarkable. No pelvic adenopathy. No inguinal adenopathy. Other: There is no abdominal ascites or free abdominal air. Musculoskeletal: Sagittal images of the spine shows no destructive bony lesions. There is interval all mild compression deformity upper endplate of L1 vertebral body. No bony protrusion in spinal canal. Clinical correlation is necessary. Mild degenerative changes lumbar spine. IMPRESSION: 1. There is mild right hydronephrosis and right hydroureter. Right nonobstructive nephrolithiasis. 2. There is 3 mm calcified obstructive calculus in distal right ureter about 1.5 cm from right UVJ. 3. There is moderate right pleural effusion. There is some pleural thickening right base posteriorly. Malignant pleural effusion cannot be excluded. 4. Multiple liver metastasis are noted with progression from prior exam. 5. There is a right retroperitoneal lymph nodes present for metastatic disease. 6. Mild compression deformity upper endplate of L1 of indeterminate age. Clinical correlation is necessary. 7. Low lying cecum.  No pericecal inflammation. 8. Colonic diverticula are noted distal colon. Mild stranding of pericolonic fat in left lower quadrant. Mild diverticulitis cannot be excluded. These results were called by telephone at the time of interpretation on 10/27/2015 at 12:55 pm to Dr. Blanchie Dessert , who verbally acknowledged these results. Electronically Signed   By: Lahoma Crocker M.D.   On: 10/27/2015 12:56    Impression/Plan: 1. 65 y.o.gentleman with extensive stage small cell lung cancer of the right middle and right lower lobe. The patient has completed PCI as well as radiotherapy to the chest. He will continue care with Dr. Julien Nordmann for his systemic disease, and have repeat imaging in the next few weeks. We discussed that given the PCI  treatment, we would plan to follow up with him in 3 months for repeat imaging of the brain. He states agreement and understanding, and is encouraged to call if he has questions or concerns prior to his next visit. 2. Radiation esophagitis. This appears to have resolved without intervention. He is encouraged to call if he has any questions or concerns prior to his next visit.      Carola Rhine, PAC

## 2015-11-21 ENCOUNTER — Ambulatory Visit (HOSPITAL_BASED_OUTPATIENT_CLINIC_OR_DEPARTMENT_OTHER): Payer: Medicare Other

## 2015-11-21 ENCOUNTER — Other Ambulatory Visit: Payer: Self-pay | Admitting: *Deleted

## 2015-11-21 ENCOUNTER — Other Ambulatory Visit (HOSPITAL_BASED_OUTPATIENT_CLINIC_OR_DEPARTMENT_OTHER): Payer: Medicare Other

## 2015-11-21 VITALS — BP 136/84 | HR 77 | Temp 97.7°F | Resp 18

## 2015-11-21 DIAGNOSIS — C3491 Malignant neoplasm of unspecified part of right bronchus or lung: Secondary | ICD-10-CM

## 2015-11-21 DIAGNOSIS — Z5111 Encounter for antineoplastic chemotherapy: Secondary | ICD-10-CM

## 2015-11-21 LAB — CBC WITH DIFFERENTIAL/PLATELET
BASO%: 0.5 % (ref 0.0–2.0)
Basophils Absolute: 0 10*3/uL (ref 0.0–0.1)
EOS ABS: 0 10*3/uL (ref 0.0–0.5)
EOS%: 0.7 % (ref 0.0–7.0)
HEMATOCRIT: 28.3 % — AB (ref 38.4–49.9)
HGB: 9.3 g/dL — ABNORMAL LOW (ref 13.0–17.1)
LYMPH%: 20.3 % (ref 14.0–49.0)
MCH: 32 pg (ref 27.2–33.4)
MCHC: 32.8 g/dL (ref 32.0–36.0)
MCV: 97.6 fL (ref 79.3–98.0)
MONO#: 0.7 10*3/uL (ref 0.1–0.9)
MONO%: 21.9 % — ABNORMAL HIGH (ref 0.0–14.0)
NEUT%: 56.6 % (ref 39.0–75.0)
NEUTROS ABS: 1.8 10*3/uL (ref 1.5–6.5)
PLATELETS: 100 10*3/uL — AB (ref 140–400)
RBC: 2.9 10*6/uL — AB (ref 4.20–5.82)
RDW: 16.2 % — ABNORMAL HIGH (ref 11.0–14.6)
WBC: 3.1 10*3/uL — AB (ref 4.0–10.3)
lymph#: 0.6 10*3/uL — ABNORMAL LOW (ref 0.9–3.3)

## 2015-11-21 LAB — COMPREHENSIVE METABOLIC PANEL
ALBUMIN: 3 g/dL — AB (ref 3.5–5.0)
ALT: 17 U/L (ref 0–55)
ANION GAP: 9 meq/L (ref 3–11)
AST: 25 U/L (ref 5–34)
Alkaline Phosphatase: 193 U/L — ABNORMAL HIGH (ref 40–150)
BUN: 18.8 mg/dL (ref 7.0–26.0)
CALCIUM: 8.9 mg/dL (ref 8.4–10.4)
CO2: 22 meq/L (ref 22–29)
CREATININE: 1 mg/dL (ref 0.7–1.3)
Chloride: 107 mEq/L (ref 98–109)
EGFR: 75 mL/min/{1.73_m2} — AB (ref >90)
Glucose: 104 mg/dl (ref 70–140)
Potassium: 4.2 mEq/L (ref 3.5–5.1)
Sodium: 138 mEq/L (ref 136–145)
TOTAL PROTEIN: 6.6 g/dL (ref 6.4–8.3)

## 2015-11-21 LAB — MAGNESIUM: Magnesium: 2.3 mg/dl (ref 1.5–2.5)

## 2015-11-21 MED ORDER — ATROPINE SULFATE 1 MG/ML IJ SOLN
0.5000 mg | Freq: Once | INTRAMUSCULAR | Status: AC | PRN
  Administered 2015-11-21: 0.5 mg via INTRAVENOUS

## 2015-11-21 MED ORDER — SODIUM CHLORIDE 0.9% FLUSH
10.0000 mL | INTRAVENOUS | Status: DC | PRN
  Administered 2015-11-21: 10 mL
  Filled 2015-11-21: qty 10

## 2015-11-21 MED ORDER — ONDANSETRON HCL 8 MG PO TABS: 8.0000 mg | ORAL_TABLET | Freq: Three times a day (TID) | ORAL | 1 refills | Status: AC | PRN

## 2015-11-21 MED ORDER — SODIUM CHLORIDE 0.9 % IV SOLN
Freq: Once | INTRAVENOUS | Status: AC
  Administered 2015-11-21: 12:00:00 via INTRAVENOUS

## 2015-11-21 MED ORDER — PALONOSETRON HCL INJECTION 0.25 MG/5ML
INTRAVENOUS | Status: AC
  Filled 2015-11-21: qty 5

## 2015-11-21 MED ORDER — ATROPINE SULFATE 1 MG/ML IJ SOLN
INTRAMUSCULAR | Status: AC
  Filled 2015-11-21: qty 1

## 2015-11-21 MED ORDER — IRINOTECAN HCL CHEMO INJECTION 100 MG/5ML
65.0000 mg/m2 | Freq: Once | INTRAVENOUS | Status: AC
  Administered 2015-11-21: 120 mg via INTRAVENOUS
  Filled 2015-11-21: qty 6

## 2015-11-21 MED ORDER — POTASSIUM CHLORIDE 2 MEQ/ML IV SOLN
Freq: Once | INTRAVENOUS | Status: AC
  Administered 2015-11-21: 10:00:00 via INTRAVENOUS
  Filled 2015-11-21: qty 10

## 2015-11-21 MED ORDER — HEPARIN SOD (PORK) LOCK FLUSH 100 UNIT/ML IV SOLN
500.0000 [IU] | Freq: Once | INTRAVENOUS | Status: AC | PRN
  Administered 2015-11-21: 500 [IU]
  Filled 2015-11-21: qty 5

## 2015-11-21 MED ORDER — CISPLATIN CHEMO INJECTION 100MG/100ML
30.0000 mg/m2 | Freq: Once | INTRAVENOUS | Status: AC
  Administered 2015-11-21: 54 mg via INTRAVENOUS
  Filled 2015-11-21: qty 54

## 2015-11-21 MED ORDER — PALONOSETRON HCL INJECTION 0.25 MG/5ML
0.2500 mg | Freq: Once | INTRAVENOUS | Status: AC
  Administered 2015-11-21: 0.25 mg via INTRAVENOUS

## 2015-11-21 MED ORDER — SODIUM CHLORIDE 0.9 % IV SOLN
Freq: Once | INTRAVENOUS | Status: AC
  Administered 2015-11-21: 12:00:00 via INTRAVENOUS
  Filled 2015-11-21: qty 5

## 2015-11-21 NOTE — Patient Instructions (Signed)
[facility] Discharge Instructions for Patients Receiving Chemotherapy  Today you received the following chemotherapy agents Camptosar/Cisplatin  To help prevent nausea and vomiting after your treatment, we encourage you to take your nausea medication    If you develop nausea and vomiting that is not controlled by your nausea medication, call the clinic.   BELOW ARE SYMPTOMS THAT SHOULD BE REPORTED IMMEDIATELY:  *FEVER GREATER THAN 100.5 F  *CHILLS WITH OR WITHOUT FEVER  NAUSEA AND VOMITING THAT IS NOT CONTROLLED WITH YOUR NAUSEA MEDICATION  *UNUSUAL SHORTNESS OF BREATH  *UNUSUAL BRUISING OR BLEEDING  TENDERNESS IN MOUTH AND THROAT WITH OR WITHOUT PRESENCE OF ULCERS  *URINARY PROBLEMS  *BOWEL PROBLEMS  UNUSUAL RASH Items with * indicate a potential emergency and should be followed up as soon as possible.  Feel free to call the clinic you have any questions or concerns. The clinic phone number is [phone].  Please show the [place] at check-in to the Emergency Department and triage nurse.

## 2015-11-27 ENCOUNTER — Ambulatory Visit: Payer: Medicare Other | Admitting: Internal Medicine

## 2015-11-27 ENCOUNTER — Other Ambulatory Visit: Payer: Medicare Other

## 2015-11-27 ENCOUNTER — Ambulatory Visit: Payer: Medicare Other

## 2015-12-01 ENCOUNTER — Encounter (HOSPITAL_COMMUNITY): Payer: Self-pay

## 2015-12-01 ENCOUNTER — Ambulatory Visit (HOSPITAL_COMMUNITY)
Admission: RE | Admit: 2015-12-01 | Discharge: 2015-12-01 | Disposition: A | Discharge status: Home / Self Care | Payer: Medicare Other | Source: Ambulatory Visit | Attending: Nurse Practitioner | Admitting: Nurse Practitioner

## 2015-12-01 DIAGNOSIS — K5730 Diverticulosis of large intestine without perforation or abscess without bleeding: Secondary | ICD-10-CM | POA: Insufficient documentation

## 2015-12-01 DIAGNOSIS — C7951 Secondary malignant neoplasm of bone: Secondary | ICD-10-CM | POA: Insufficient documentation

## 2015-12-01 DIAGNOSIS — J90 Pleural effusion, not elsewhere classified: Secondary | ICD-10-CM | POA: Insufficient documentation

## 2015-12-01 DIAGNOSIS — M4856XA Collapsed vertebra, not elsewhere classified, lumbar region, initial encounter for fracture: Secondary | ICD-10-CM | POA: Insufficient documentation

## 2015-12-01 DIAGNOSIS — I2510 Atherosclerotic heart disease of native coronary artery without angina pectoris: Secondary | ICD-10-CM | POA: Insufficient documentation

## 2015-12-01 DIAGNOSIS — C782 Secondary malignant neoplasm of pleura: Secondary | ICD-10-CM | POA: Diagnosis not present

## 2015-12-01 DIAGNOSIS — I700 Atherosclerosis of aorta: Secondary | ICD-10-CM | POA: Diagnosis not present

## 2015-12-01 DIAGNOSIS — C3491 Malignant neoplasm of unspecified part of right bronchus or lung: Secondary | ICD-10-CM | POA: Diagnosis present

## 2015-12-01 DIAGNOSIS — C787 Secondary malignant neoplasm of liver and intrahepatic bile duct: Secondary | ICD-10-CM | POA: Insufficient documentation

## 2015-12-01 MED ORDER — IOPAMIDOL (ISOVUE-300) INJECTION 61%
100.0000 mL | Freq: Once | INTRAVENOUS | Status: AC | PRN
  Administered 2015-12-01: 100 mL via INTRAVENOUS

## 2015-12-04 ENCOUNTER — Ambulatory Visit: Payer: Medicare Other | Admitting: Nurse Practitioner

## 2015-12-04 ENCOUNTER — Ambulatory Visit (HOSPITAL_BASED_OUTPATIENT_CLINIC_OR_DEPARTMENT_OTHER): Payer: Medicare Other | Admitting: Internal Medicine

## 2015-12-04 ENCOUNTER — Ambulatory Visit (HOSPITAL_BASED_OUTPATIENT_CLINIC_OR_DEPARTMENT_OTHER): Payer: Medicare Other

## 2015-12-04 ENCOUNTER — Other Ambulatory Visit (HOSPITAL_BASED_OUTPATIENT_CLINIC_OR_DEPARTMENT_OTHER): Payer: Medicare Other

## 2015-12-04 ENCOUNTER — Encounter: Payer: Self-pay | Admitting: Internal Medicine

## 2015-12-04 VITALS — BP 125/75 | HR 76 | Temp 98.4°F | Resp 18

## 2015-12-04 DIAGNOSIS — F329 Major depressive disorder, single episode, unspecified: Secondary | ICD-10-CM

## 2015-12-04 DIAGNOSIS — C3491 Malignant neoplasm of unspecified part of right bronchus or lung: Secondary | ICD-10-CM | POA: Diagnosis not present

## 2015-12-04 DIAGNOSIS — F32A Depression, unspecified: Secondary | ICD-10-CM | POA: Insufficient documentation

## 2015-12-04 DIAGNOSIS — C342 Malignant neoplasm of middle lobe, bronchus or lung: Secondary | ICD-10-CM | POA: Diagnosis present

## 2015-12-04 DIAGNOSIS — C3490 Malignant neoplasm of unspecified part of unspecified bronchus or lung: Secondary | ICD-10-CM

## 2015-12-04 DIAGNOSIS — C3431 Malignant neoplasm of lower lobe, right bronchus or lung: Secondary | ICD-10-CM

## 2015-12-04 DIAGNOSIS — Z5111 Encounter for antineoplastic chemotherapy: Secondary | ICD-10-CM

## 2015-12-04 DIAGNOSIS — Z95828 Presence of other vascular implants and grafts: Secondary | ICD-10-CM

## 2015-12-04 DIAGNOSIS — C786 Secondary malignant neoplasm of retroperitoneum and peritoneum: Secondary | ICD-10-CM

## 2015-12-04 DIAGNOSIS — C787 Secondary malignant neoplasm of liver and intrahepatic bile duct: Secondary | ICD-10-CM

## 2015-12-04 LAB — CBC WITH DIFFERENTIAL/PLATELET
BASO%: 0.6 % (ref 0.0–2.0)
BASOS ABS: 0 10*3/uL (ref 0.0–0.1)
EOS%: 0.9 % (ref 0.0–7.0)
Eosinophils Absolute: 0 10*3/uL (ref 0.0–0.5)
HCT: 26.7 % — ABNORMAL LOW (ref 38.4–49.9)
HEMOGLOBIN: 8.8 g/dL — AB (ref 13.0–17.1)
LYMPH%: 14.9 % (ref 14.0–49.0)
MCH: 32.1 pg (ref 27.2–33.4)
MCHC: 33 g/dL (ref 32.0–36.0)
MCV: 97.1 fL (ref 79.3–98.0)
MONO#: 0.8 10*3/uL (ref 0.1–0.9)
MONO%: 22.9 % — AB (ref 0.0–14.0)
NEUT#: 2.2 10*3/uL (ref 1.5–6.5)
NEUT%: 60.7 % (ref 39.0–75.0)
Platelets: 106 10*3/uL — ABNORMAL LOW (ref 140–400)
RBC: 2.75 10*6/uL — ABNORMAL LOW (ref 4.20–5.82)
RDW: 16.3 % — AB (ref 11.0–14.6)
WBC: 3.6 10*3/uL — ABNORMAL LOW (ref 4.0–10.3)
lymph#: 0.5 10*3/uL — ABNORMAL LOW (ref 0.9–3.3)

## 2015-12-04 LAB — COMPREHENSIVE METABOLIC PANEL
ALBUMIN: 3 g/dL — AB (ref 3.5–5.0)
ALT: 11 U/L (ref 0–55)
AST: 24 U/L (ref 5–34)
Alkaline Phosphatase: 169 U/L — ABNORMAL HIGH (ref 40–150)
Anion Gap: 9 mEq/L (ref 3–11)
BUN: 14.4 mg/dL (ref 7.0–26.0)
CHLORIDE: 108 meq/L (ref 98–109)
CO2: 20 mEq/L — ABNORMAL LOW (ref 22–29)
Calcium: 9 mg/dL (ref 8.4–10.4)
Creatinine: 1.1 mg/dL (ref 0.7–1.3)
EGFR: 70 mL/min/{1.73_m2} — ABNORMAL LOW (ref >90)
GLUCOSE: 125 mg/dL (ref 70–140)
POTASSIUM: 3.7 meq/L (ref 3.5–5.1)
SODIUM: 137 meq/L (ref 136–145)
Total Bilirubin: <0.22 mg/dL (ref 0.20–1.20)
Total Protein: 6.6 g/dL (ref 6.4–8.3)

## 2015-12-04 LAB — MAGNESIUM: MAGNESIUM: 1.8 mg/dL (ref 1.5–2.5)

## 2015-12-04 MED ORDER — ATROPINE SULFATE 1 MG/ML IJ SOLN
INTRAMUSCULAR | Status: AC
  Filled 2015-12-04: qty 1

## 2015-12-04 MED ORDER — HEPARIN SOD (PORK) LOCK FLUSH 100 UNIT/ML IV SOLN
500.0000 [IU] | Freq: Once | INTRAVENOUS | Status: AC | PRN
  Administered 2015-12-04: 500 [IU]
  Filled 2015-12-04: qty 5

## 2015-12-04 MED ORDER — SODIUM CHLORIDE 0.9% FLUSH
10.0000 mL | INTRAVENOUS | Status: DC | PRN
  Administered 2015-12-04: 10 mL
  Filled 2015-12-04: qty 10

## 2015-12-04 MED ORDER — ATROPINE SULFATE 1 MG/ML IJ SOLN
0.5000 mg | Freq: Once | INTRAMUSCULAR | Status: AC | PRN
  Administered 2015-12-04: 0.5 mg via INTRAVENOUS

## 2015-12-04 MED ORDER — SODIUM CHLORIDE 0.9 % IJ SOLN
10.0000 mL | INTRAMUSCULAR | Status: DC | PRN
  Administered 2015-12-04: 10 mL via INTRAVENOUS
  Filled 2015-12-04: qty 10

## 2015-12-04 MED ORDER — PALONOSETRON HCL INJECTION 0.25 MG/5ML
INTRAVENOUS | Status: AC
  Filled 2015-12-04: qty 5

## 2015-12-04 MED ORDER — SODIUM CHLORIDE 0.9 % IV SOLN
Freq: Once | INTRAVENOUS | Status: AC
  Administered 2015-12-04: 10:00:00 via INTRAVENOUS

## 2015-12-04 MED ORDER — POTASSIUM CHLORIDE 2 MEQ/ML IV SOLN
Freq: Once | INTRAVENOUS | Status: AC
  Administered 2015-12-04: 11:00:00 via INTRAVENOUS
  Filled 2015-12-04: qty 10

## 2015-12-04 MED ORDER — IRINOTECAN HCL CHEMO INJECTION 100 MG/5ML
65.0000 mg/m2 | Freq: Once | INTRAVENOUS | Status: AC
  Administered 2015-12-04: 120 mg via INTRAVENOUS
  Filled 2015-12-04: qty 6

## 2015-12-04 MED ORDER — MIRTAZAPINE 30 MG PO TABS: 30.0000 mg | ORAL_TABLET | Freq: Every day | ORAL | 2 refills | Status: DC

## 2015-12-04 MED ORDER — PALONOSETRON HCL INJECTION 0.25 MG/5ML
0.2500 mg | Freq: Once | INTRAVENOUS | Status: AC
  Administered 2015-12-04: 0.25 mg via INTRAVENOUS

## 2015-12-04 MED ORDER — SODIUM CHLORIDE 0.9 % IV SOLN
30.0000 mg/m2 | Freq: Once | INTRAVENOUS | Status: AC
  Administered 2015-12-04: 54 mg via INTRAVENOUS
  Filled 2015-12-04: qty 54

## 2015-12-04 MED ORDER — SODIUM CHLORIDE 0.9 % IV SOLN
Freq: Once | INTRAVENOUS | Status: AC
  Administered 2015-12-04: 13:00:00 via INTRAVENOUS
  Filled 2015-12-04: qty 5

## 2015-12-04 NOTE — Patient Instructions (Signed)
[facility] Discharge Instructions for Patients Receiving Chemotherapy  Today you received the following chemotherapy agents: Irinotecan and Cisplatin   To help prevent nausea and vomiting after your treatment, we encourage you to take your nausea medication as directed.    If you develop nausea and vomiting that is not controlled by your nausea medication, call the clinic.   BELOW ARE SYMPTOMS THAT SHOULD BE REPORTED IMMEDIATELY:  *FEVER GREATER THAN 100.5 F  *CHILLS WITH OR WITHOUT FEVER  NAUSEA AND VOMITING THAT IS NOT CONTROLLED WITH YOUR NAUSEA MEDICATION  *UNUSUAL SHORTNESS OF BREATH  *UNUSUAL BRUISING OR BLEEDING  TENDERNESS IN MOUTH AND THROAT WITH OR WITHOUT PRESENCE OF ULCERS  *URINARY PROBLEMS  *BOWEL PROBLEMS  UNUSUAL RASH Items with * indicate a potential emergency and should be followed up as soon as possible.  Feel free to call the clinic you have any questions or concerns. The clinic phone number is [phone].  Please show the CHEMO ALERT CARD at check-in to the Emergency Department and triage nurse.   

## 2015-12-04 NOTE — Progress Notes (Signed)
[place] Telephone:([phone]) [phone]   Fax:([phone]  OFFICE PROGRESS NOTE  [name], MD [place] [address]  DIAGNOSIS: Extensive stage (T3, N3, M1b) small cell lung cancer presented with large right middle and lower lobe mass in addition to extensive mediastinal lymphadenopathy, right pleural effusion as well as extensive liver metastasis and retroperitoneal lymphadenopathy diagnosed in April 2017.  PRIOR THERAPY:  1) Systemic chemotherapy with reduced dose carboplatin for AUC of 3 on day 1 and etoposide 60 MG/M2 on days 1, 2 and 3. First dose was given at [facility]. Status post 4 cycles. Starting from cycle #3 his carboplatin is for AUC of 5 and etoposide 100 MG/M2. Last dose of chemotherapy was given on 08/04/2015 discontinued secondary to disease progression. 2) prophylactic cranial irradiation as well as palliative radiotherapy to the chest under the care of Dr. Tammi Klippel completed 10/03/2015.  CURRENT THERAPY: Systemic chemotherapy with cisplatin 30 MG/M2 and irinotecan 65 MG/M2 on days 1 and 8 every 3 weeks. Status post 2 cycles.  INTERVAL HISTORY: Shane Vasquez 65 y.o. male returns to the clinic today for follow-up visit accompanied by his wife. The patient is feeling much better today with no specific complaints except for occasional chills. He also run low-grade fever. His weight has been stable. He denied having any significant nausea, vomiting, diarrhea or constipation. He has no chest pain, shortness of breath, cough or hemoptysis. He is tolerating his treatment with cisplatin and irinotecan fairly well. The patient denied having any headache or visual changes. He had repeat CT scan of the chest, abdomen and pelvis performed recently and his here for evaluation and discussion of his scan results.  MEDICAL HISTORY: Past Medical History:  Diagnosis Date  . CAP (community acquired pneumonia)    LLL 07/2014  .  CERVICAL RADICULOPATHY, RIGHT 08/12/2009  . DIVERTICULOSIS, COLON 08/15/2006  . Empyema lung ([place])    2016 on left  . GERD 08/15/2006  . HYPERLIPIDEMIA 08/15/2006  . HYPERTENSION 08/15/2006  . INTERNAL HEMORRHOIDS 02/29/2008  . Lung cancer ([place])    metastatic small cell lung cancer  . SKIN CANCER, HX OF 08/15/2006    ALLERGIES:  is allergic to bee venom.  MEDICATIONS:  Current Outpatient Prescriptions  Medication Sig Dispense Refill  . acetaminophen (TYLENOL) 500 MG tablet Take 1,000 mg by mouth every 8 (eight) hours as needed.    Marland Kitchen albuterol (PROVENTIL HFA;VENTOLIN HFA) 108 (90 Base) MCG/ACT inhaler Inhale 1-2 puffs into the lungs every 6 (six) hours as needed for wheezing or shortness of breath. 1 Inhaler 2  . chlorproMAZINE (THORAZINE) 25 MG tablet Take 1 tablet (25 mg total) by mouth 3 (three) times daily as needed. for  hiccoughs 30 tablet 0  . diphenhydramine-acetaminophen (TYLENOL PM) 25-500 MG TABS tablet Take 1 tablet by mouth at bedtime as needed.    Marland Kitchen EPINEPHrine 0.3 mg/0.3 mL IJ SOAJ injection Inject 0.3 mLs (0.3 mg total) into the muscle once. 1 Device 2  . lidocaine-prilocaine (EMLA) cream Apply to port -a-cath 1-2 hours prior to access. 30 g 2  . mirtazapine (REMERON) 15 MG tablet Take 1 tablet (15 mg total) by mouth at bedtime. 30 tablet 3  . ondansetron (ZOFRAN) 8 MG tablet Take 1 tablet (8 mg total) by mouth every 8 (eight) hours as needed for nausea or vomiting. 30 tablet 1  . oxyCODONE (OXY IR/ROXICODONE) 5 MG immediate release tablet Take 1-2 tabs PO Q 4-6 hours PRN  pain. 60 tablet 0  . oxymetazoline (AFRIN) 0.05 % nasal spray Place 1 spray into both nostrils 2 (two) times daily.    . polyethylene glycol (MIRALAX / GLYCOLAX) packet Take 17 g by mouth daily as needed for mild constipation.    . prochlorperazine (COMPAZINE) 10 MG tablet Take 1 tablet (10 mg total) by mouth every 6 (six) hours as needed for nausea or vomiting. DO  NOT  TAKE  WITH  THORAZINE. 30 tablet 0  .  senna (SENOKOT) 8.6 MG TABS tablet Take 1 tablet (8.6 mg total) by mouth daily.  0   No current facility-administered medications for this visit.    Facility-Administered Medications Ordered in Other Visits  Medication Dose Route Frequency Provider Last Rate Last Dose  . 0.9 %  sodium chloride infusion   Intravenous Once Curt Bears, MD      . sodium chloride 0.9 % injection 10 mL  10 mL Intravenous PRN Curt Bears, MD   10 mL at 12/04/15 0850  . sodium chloride flush (NS) 0.9 % injection 10 mL  10 mL Intracatheter PRN Curt Bears, MD   10 mL at 10/17/15 1829    SURGICAL HISTORY:  Past Surgical History:  Procedure Laterality Date  . TONSILLECTOMY    . VIDEO ASSISTED THORACOSCOPY (VATS)/EMPYEMA Left 07/27/2014   Procedure: VIDEO ASSISTED THORACOSCOPY (VATS)/EMPYEMA Left, drainage pleural effusion.;  Surgeon: [name], MD;  Location: [facility];  Service: Thoracic;  Laterality: Left;  Marland Kitchen VIDEO BRONCHOSCOPY N/A 07/27/2014   Procedure: VIDEO BRONCHOSCOPY;  Surgeon: [name], MD;  Location: [facility] OR;  Service: Thoracic;  Laterality: N/A;  . VIDEO BRONCHOSCOPY Bilateral 05/20/2015   Procedure: VIDEO BRONCHOSCOPY WITH FLUORO;  Surgeon: Collene Gobble, MD;  Location: [facility];  Service: Cardiopulmonary;  Laterality: Bilateral;    REVIEW OF SYSTEMS:  Constitutional: positive for fatigue Eyes: negative Ears, nose, mouth, throat, and face: negative Respiratory: negative Cardiovascular: negative Gastrointestinal: negative Genitourinary:negative Integument/breast: negative Hematologic/lymphatic: negative Musculoskeletal:negative Neurological: negative Behavioral/Psych: negative Endocrine: negative Allergic/Immunologic: negative   PHYSICAL EXAMINATION: General appearance: alert, cooperative, fatigued and no distress Head: Normocephalic, without obvious abnormality, atraumatic Neck: no adenopathy, no JVD, supple, symmetrical, trachea midline and thyroid not enlarged,  symmetric, no tenderness/mass/nodules Lymph nodes: Cervical, supraclavicular, and axillary nodes normal. Resp: clear to auscultation bilaterally Back: symmetric, no curvature. ROM normal. No CVA tenderness. Cardio: regular rate and rhythm, S1, S2 normal, no murmur, click, rub or gallop GI: soft, non-tender; bowel sounds normal; no masses,  no organomegaly Extremities: extremities normal, atraumatic, no cyanosis or edema Neurologic: Alert and oriented X 3, normal strength and tone. Normal symmetric reflexes. Normal coordination and gait  ECOG PERFORMANCE STATUS: 1 - Symptomatic but completely ambulatory  There were no vitals taken for this visit.  LABORATORY DATA: Lab Results  Component Value Date   WBC 3.6 (L) 12/04/2015   HGB 8.8 (L) 12/04/2015   HCT 26.7 (L) 12/04/2015   MCV 97.1 12/04/2015   PLT 106 (L) 12/04/2015      Chemistry      Component Value Date/Time   NA 137 12/04/2015 0817   K 3.7 12/04/2015 0817   CL 103 10/27/2015 1216   CO2 20 (L) 12/04/2015 0817   BUN 14.4 12/04/2015 0817   CREATININE 1.1 12/04/2015 0817      Component Value Date/Time   CALCIUM 9.0 12/04/2015 0817   ALKPHOS 169 (H) 12/04/2015 0817   AST 24 12/04/2015 0817   ALT 11 12/04/2015 0817   BILITOT <0.22  12/04/2015 0817       RADIOGRAPHIC STUDIES: Ct Chest W Contrast  Result Date: 12/01/2015 CLINICAL DATA:  Chemotherapy in progress for small cell right-sided lung cancer. Restaging assessment. EXAM: CT CHEST, ABDOMEN, AND PELVIS WITH CONTRAST TECHNIQUE: Multidetector CT imaging of the chest, abdomen and pelvis was performed following the standard protocol during bolus administration of intravenous contrast. CONTRAST:  164m ISOVUE-300 IOPAMIDOL (ISOVUE-300) INJECTION 61% COMPARISON:  Multiple exams, including 09/18/2015 FINDINGS: CT CHEST FINDINGS Cardiovascular: Mild left anterior descending coronary artery atherosclerotic calcification. Mediastinum/Nodes: Right paratracheal lymph node 0.4 cm  in short axis on image 16/2, formerly 1.4 cm. Right lower paratracheal lymph node 0.6 cm in short axis on image 24/2, formerly 2.8 cm. Subcarinal lymph node short axis diameter 0.8 cm in short axis, formerly 2.4 cm. Right hilar and infrahilar adenopathy are also markedly improved. Lungs/Pleura: Moderate right-sided pleural effusion with inferior pleural tumor along the dependent portions of the right pleural space. However, this does appear improved, measuring only about 5 mm and rind like thickness as opposed to prior measurements of up to 10 mm. Nodularity along the pleural adipose tissue on the right is also reduced. Biapical pleuroparenchymal scarring. Centrilobular emphysema. The previously occluded right middle lobe bronchus is now patent although there still is a considerable amount of right middle lobe volume loss/ atelectasis, involving almost all the right middle lobe. Airway thickening noted bilaterally. Scarring in the lingula and left lower lobe. Musculoskeletal: Subtle heterogeneity of density in various vertebra and in the ribs, compatible with osseous metastatic disease. CT ABDOMEN PELVIS FINDINGS Hepatobiliary: Innumerable metastatic lesions are scattered throughout all segments of the liver. The margins of these lesions are less sharply defined than on the prior exam. Dominant right hepatic lobe lesion measures 3.5 cm in the anterior- posterior dimension, previously 3.3 cm. This lesion demonstrates central necrosis although the smaller lesions do not. Some other lesions are reduced in size, for example a segment 4 a lesion measuring 2 cm in diameter today measured 2.7 cm previously. Although the overall appearance is generally next there is seen to be more lesions and overall I feel that the tumor burden in the liver is worsened. Pancreas: Unremarkable Spleen: Unremarkable Adrenals/Urinary Tract: 1.3 cm sharply defined fluid density lesion in the left mid kidney, not appreciably changed from prior,  favoring cyst. Stomach/Bowel: Sigmoid colon diverticulosis. Vascular/Lymphatic: Aortoiliac atherosclerotic vascular disease. Notable retrocrural lymph nodes including a 8 mm in short axis node on image 55/2. Lymph node to the right of the IVC at the level the right renal artery measures 1.0 cm in short axis, previously 0.7 cm. Portacaval lymph node measures 1.7 cm in short axis, formerly 0.7 cm in short axis. Reproductive: Unremarkable Other: No supplemental non-categorized findings. Musculoskeletal: Remote mild superior endplate compression fracture of the L1 vertebral body, 30% loss of vertebral body height without significant bony retropulsion. This is stable compared to the prior exam. 1.2 cm sclerotic lesion of the right iliac crest. Similar mild sclerosis in the left iliac crest. Faint sclerosis at the right ischial tuberosity. IMPRESSION: 1. Mixed response, with significant improvement in malignant involvement in the thorax including the pleural metastatic disease and adenopathy; but with some new and increasing hepatic metastatic lesions, as well as some progressive adenopathy in the abdomen compatible with metastatic disease. The primary lesion in the right infrahilar region is markedly improved but difficult to separately measure from the surrounding atelectasis. 2. There is some scattered faint areas of sclerosis in the ribs, thoracic vertebra, and bony  pelvis, suspicious for subtle osseous metastatic disease. Consider bone scan or nuclear medicine PET-CT for further characterization. 3. Other imaging findings of potential clinical significance: Coronary atherosclerosis. Aortoiliac atherosclerotic vascular disease. Moderate-sized right pleural effusion. Remote superior endplate compression fracture at L1. Sigmoid diverticulosis. Electronically Signed   By: Van Clines M.D.   On: 12/01/2015 10:37   Ct Abdomen Pelvis W Contrast  Result Date: 12/01/2015 CLINICAL DATA:  Chemotherapy in progress  for small cell right-sided lung cancer. Restaging assessment. EXAM: CT CHEST, ABDOMEN, AND PELVIS WITH CONTRAST TECHNIQUE: Multidetector CT imaging of the chest, abdomen and pelvis was performed following the standard protocol during bolus administration of intravenous contrast. CONTRAST:  135m ISOVUE-300 IOPAMIDOL (ISOVUE-300) INJECTION 61% COMPARISON:  Multiple exams, including 09/18/2015 FINDINGS: CT CHEST FINDINGS Cardiovascular: Mild left anterior descending coronary artery atherosclerotic calcification. Mediastinum/Nodes: Right paratracheal lymph node 0.4 cm in short axis on image 16/2, formerly 1.4 cm. Right lower paratracheal lymph node 0.6 cm in short axis on image 24/2, formerly 2.8 cm. Subcarinal lymph node short axis diameter 0.8 cm in short axis, formerly 2.4 cm. Right hilar and infrahilar adenopathy are also markedly improved. Lungs/Pleura: Moderate right-sided pleural effusion with inferior pleural tumor along the dependent portions of the right pleural space. However, this does appear improved, measuring only about 5 mm and rind like thickness as opposed to prior measurements of up to 10 mm. Nodularity along the pleural adipose tissue on the right is also reduced. Biapical pleuroparenchymal scarring. Centrilobular emphysema. The previously occluded right middle lobe bronchus is now patent although there still is a considerable amount of right middle lobe volume loss/ atelectasis, involving almost all the right middle lobe. Airway thickening noted bilaterally. Scarring in the lingula and left lower lobe. Musculoskeletal: Subtle heterogeneity of density in various vertebra and in the ribs, compatible with osseous metastatic disease. CT ABDOMEN PELVIS FINDINGS Hepatobiliary: Innumerable metastatic lesions are scattered throughout all segments of the liver. The margins of these lesions are less sharply defined than on the prior exam. Dominant right hepatic lobe lesion measures 3.5 cm in the anterior-  posterior dimension, previously 3.3 cm. This lesion demonstrates central necrosis although the smaller lesions do not. Some other lesions are reduced in size, for example a segment 4 a lesion measuring 2 cm in diameter today measured 2.7 cm previously. Although the overall appearance is generally next there is seen to be more lesions and overall I feel that the tumor burden in the liver is worsened. Pancreas: Unremarkable Spleen: Unremarkable Adrenals/Urinary Tract: 1.3 cm sharply defined fluid density lesion in the left mid kidney, not appreciably changed from prior, favoring cyst. Stomach/Bowel: Sigmoid colon diverticulosis. Vascular/Lymphatic: Aortoiliac atherosclerotic vascular disease. Notable retrocrural lymph nodes including a 8 mm in short axis node on image 55/2. Lymph node to the right of the IVC at the level the right renal artery measures 1.0 cm in short axis, previously 0.7 cm. Portacaval lymph node measures 1.7 cm in short axis, formerly 0.7 cm in short axis. Reproductive: Unremarkable Other: No supplemental non-categorized findings. Musculoskeletal: Remote mild superior endplate compression fracture of the L1 vertebral body, 30% loss of vertebral body height without significant bony retropulsion. This is stable compared to the prior exam. 1.2 cm sclerotic lesion of the right iliac crest. Similar mild sclerosis in the left iliac crest. Faint sclerosis at the right ischial tuberosity. IMPRESSION: 1. Mixed response, with significant improvement in malignant involvement in the thorax including the pleural metastatic disease and adenopathy; but with some new and increasing hepatic  metastatic lesions, as well as some progressive adenopathy in the abdomen compatible with metastatic disease. The primary lesion in the right infrahilar region is markedly improved but difficult to separately measure from the surrounding atelectasis. 2. There is some scattered faint areas of sclerosis in the ribs, thoracic  vertebra, and bony pelvis, suspicious for subtle osseous metastatic disease. Consider bone scan or nuclear medicine PET-CT for further characterization. 3. Other imaging findings of potential clinical significance: Coronary atherosclerosis. Aortoiliac atherosclerotic vascular disease. Moderate-sized right pleural effusion. Remote superior endplate compression fracture at L1. Sigmoid diverticulosis. Electronically Signed   By: Van Clines M.D.   On: 12/01/2015 10:37    ASSESSMENT AND PLAN: This is a very pleasant 65 years old white male recently diagnosed with extensive stage small cell lung cancer status post 4 cycles of chemotherapy with carboplatin and etoposide. The patient is tolerating his treatment fairly well with no significant adverse effects.  Initial imaging studies showed improvement in his disease and the patient was considered for enrollment in a maintenance clinical trial with ROVA-T but unfortunately he became symptomatic and repeat imaging studies showed evidence for disease progression and the patient was not eligible for the trial. He completed a course of prophylactic cranial irradiation as well as palliative radiotherapy to the chest under the care of Dr. Tammi Klippel. He is currently undergoing second line systemic chemotherapy with cisplatin and irinotecan on days 1 and 8 every 3 weeks status post 2 cycles. He tolerated the second cycle of his treatment fairly well. The recent CT scan of the chest, abdomen and pelvis showed significant improvement in his disease in the chest. There was some concern about mild disease progression in the liver. I discussed the scan results with the patient and his wife. I recommended for him to continue his current treatment with cisplatin and irinotecan as a scheduled. He will start cycle #3 today. For depression, I will increase the dose of Remeron to 30 mg by mouth daily at bedtime. He would come back for follow-up visit in 3 weeks for evaluation  and management of any adverse effect of his treatment before starting cycle #4. The patient and his wife agreed to the current plan. He was advised to call immediately if he has any concerning symptoms in the interval. The patient voices understanding of current disease status and treatment options and is in agreement with the current care plan.  All questions were answered. The patient knows to call the clinic with any problems, questions or concerns. We can certainly see the patient much sooner if necessary.  Disclaimer: This note was dictated with voice recognition software. Similar sounding words can inadvertently be transcribed and may not be corrected upon review.

## 2015-12-05 ENCOUNTER — Telehealth: Payer: Self-pay | Admitting: *Deleted

## 2015-12-05 NOTE — Telephone Encounter (Signed)
Per LOS I have scheduled appts and notified the scheduler 

## 2015-12-06 ENCOUNTER — Telehealth: Payer: Self-pay | Admitting: Internal Medicine

## 2015-12-06 NOTE — Telephone Encounter (Signed)
Added additional appointments for end of November and December. Patient will get scheduled at next visit 10/26. Patient also my chart active.

## 2015-12-11 ENCOUNTER — Ambulatory Visit (HOSPITAL_BASED_OUTPATIENT_CLINIC_OR_DEPARTMENT_OTHER): Payer: Medicare Other

## 2015-12-11 ENCOUNTER — Ambulatory Visit (HOSPITAL_COMMUNITY)
Admission: RE | Admit: 2015-12-11 | Discharge: 2015-12-11 | Disposition: A | Discharge status: Home / Self Care | Payer: Medicare Other | Source: Ambulatory Visit | Attending: Internal Medicine | Admitting: Internal Medicine

## 2015-12-11 ENCOUNTER — Other Ambulatory Visit: Payer: Self-pay | Admitting: Medical Oncology

## 2015-12-11 ENCOUNTER — Ambulatory Visit: Payer: Medicare Other

## 2015-12-11 ENCOUNTER — Telehealth: Payer: Self-pay | Admitting: Medical Oncology

## 2015-12-11 ENCOUNTER — Other Ambulatory Visit (HOSPITAL_BASED_OUTPATIENT_CLINIC_OR_DEPARTMENT_OTHER): Payer: Medicare Other

## 2015-12-11 DIAGNOSIS — D649 Anemia, unspecified: Secondary | ICD-10-CM | POA: Diagnosis present

## 2015-12-11 DIAGNOSIS — C3491 Malignant neoplasm of unspecified part of right bronchus or lung: Secondary | ICD-10-CM

## 2015-12-11 LAB — CBC WITH DIFFERENTIAL/PLATELET
BASO%: 0.4 % (ref 0.0–2.0)
BASOS ABS: 0 10*3/uL (ref 0.0–0.1)
EOS%: 1.2 % (ref 0.0–7.0)
Eosinophils Absolute: 0 10*3/uL (ref 0.0–0.5)
HCT: 23.6 % — ABNORMAL LOW (ref 38.4–49.9)
HGB: 7.9 g/dL — ABNORMAL LOW (ref 13.0–17.1)
LYMPH%: 22.5 % (ref 14.0–49.0)
MCH: 32.1 pg (ref 27.2–33.4)
MCHC: 33.5 g/dL (ref 32.0–36.0)
MCV: 95.9 fL (ref 79.3–98.0)
MONO#: 0.4 10*3/uL (ref 0.1–0.9)
MONO%: 15.4 % — AB (ref 0.0–14.0)
NEUT#: 1.5 10*3/uL (ref 1.5–6.5)
NEUT%: 60.5 % (ref 39.0–75.0)
Platelets: 70 10*3/uL — ABNORMAL LOW (ref 140–400)
RBC: 2.46 10*6/uL — ABNORMAL LOW (ref 4.20–5.82)
RDW: 16.2 % — ABNORMAL HIGH (ref 11.0–14.6)
WBC: 2.5 10*3/uL — ABNORMAL LOW (ref 4.0–10.3)
lymph#: 0.6 10*3/uL — ABNORMAL LOW (ref 0.9–3.3)

## 2015-12-11 LAB — COMPREHENSIVE METABOLIC PANEL
ALT: 17 U/L (ref 0–55)
ANION GAP: 9 meq/L (ref 3–11)
AST: 24 U/L (ref 5–34)
Albumin: 2.9 g/dL — ABNORMAL LOW (ref 3.5–5.0)
Alkaline Phosphatase: 186 U/L — ABNORMAL HIGH (ref 40–150)
BUN: 22.4 mg/dL (ref 7.0–26.0)
CALCIUM: 8.6 mg/dL (ref 8.4–10.4)
CHLORIDE: 110 meq/L — AB (ref 98–109)
CO2: 21 meq/L — AB (ref 22–29)
Creatinine: 1 mg/dL (ref 0.7–1.3)
EGFR: 74 mL/min/{1.73_m2} — AB (ref >90)
Glucose: 145 mg/dl — ABNORMAL HIGH (ref 70–140)
Potassium: 4 mEq/L (ref 3.5–5.1)
Sodium: 140 mEq/L (ref 136–145)
TOTAL PROTEIN: 6.5 g/dL (ref 6.4–8.3)

## 2015-12-11 LAB — PREPARE RBC (CROSSMATCH)

## 2015-12-11 LAB — MAGNESIUM: Magnesium: 1.8 mg/dl (ref 1.5–2.5)

## 2015-12-11 MED ORDER — DIPHENHYDRAMINE HCL 25 MG PO CAPS
ORAL_CAPSULE | ORAL | Status: AC
  Filled 2015-12-11: qty 1

## 2015-12-11 MED ORDER — HEPARIN SOD (PORK) LOCK FLUSH 100 UNIT/ML IV SOLN
500.0000 [IU] | Freq: Every day | INTRAVENOUS | Status: AC | PRN
Start: 2015-12-11 — End: 2015-12-11
  Administered 2015-12-11: 500 [IU]
  Filled 2015-12-11: qty 5

## 2015-12-11 MED ORDER — SODIUM CHLORIDE 0.9 % IV SOLN
250.0000 mL | Freq: Once | INTRAVENOUS | Status: AC
Start: 2015-12-11 — End: 2015-12-11
  Administered 2015-12-11: 250 mL via INTRAVENOUS

## 2015-12-11 MED ORDER — ACETAMINOPHEN 325 MG PO TABS
650.0000 mg | ORAL_TABLET | Freq: Once | ORAL | Status: AC
  Administered 2015-12-11: 650 mg via ORAL

## 2015-12-11 MED ORDER — SODIUM CHLORIDE 0.9% FLUSH
10.0000 mL | INTRAVENOUS | Status: AC | PRN
  Administered 2015-12-11: 10 mL
  Filled 2015-12-11: qty 10

## 2015-12-11 MED ORDER — ACETAMINOPHEN 325 MG PO TABS
ORAL_TABLET | ORAL | Status: AC
  Filled 2015-12-11: qty 2

## 2015-12-11 MED ORDER — DIPHENHYDRAMINE HCL 25 MG PO CAPS
25.0000 mg | ORAL_CAPSULE | Freq: Once | ORAL | Status: AC
Start: 2015-12-11 — End: 2015-12-11
  Administered 2015-12-11: 25 mg via ORAL

## 2015-12-11 NOTE — Patient Instructions (Addendum)

## 2015-12-11 NOTE — Progress Notes (Signed)
Notified Dr. Julien Nordmann of CBC results.  Will hold chemo today.  2 units of PRBC's to be given.  Patient informed and agreed to plan of care.

## 2015-12-11 NOTE — Telephone Encounter (Signed)
Does pt make up cancelled tx or just come back as scheduled nov 9? Note to [place].Marland Kitchen

## 2015-12-12 LAB — TYPE AND SCREEN
ABO/RH(D): O POS
Antibody Screen: NEGATIVE
UNIT DIVISION: 0
Unit division: 0

## 2015-12-12 NOTE — Telephone Encounter (Signed)
No. Start with the next cycle in 2 weeks.

## 2015-12-15 NOTE — Telephone Encounter (Signed)
Wife notified via voice message.

## 2015-12-18 ENCOUNTER — Other Ambulatory Visit: Payer: Self-pay | Admitting: *Deleted

## 2015-12-18 DIAGNOSIS — C7949 Secondary malignant neoplasm of other parts of nervous system: Principal | ICD-10-CM

## 2015-12-18 DIAGNOSIS — C7931 Secondary malignant neoplasm of brain: Secondary | ICD-10-CM

## 2015-12-19 ENCOUNTER — Other Ambulatory Visit: Payer: Self-pay | Admitting: *Deleted

## 2015-12-19 ENCOUNTER — Telehealth: Payer: Self-pay | Admitting: *Deleted

## 2015-12-19 NOTE — Telephone Encounter (Signed)
1.  Do you need a wheel chair?  NO  2. On oxygen? NO  3. Have you ever had any surgery in the body part being scanned?  NO  4. Have you ever had any surgery on your brain or heart?    NO   5. Have you ever had surgery on your eyes or ears?        NO   6. Do you have a pacemaker or defibrillator?  NO  7. Do you have a Neurostimulator? NO  8. Claustrophobic?  NO  9. Any risk for metal in eyes? NO  10. Injury by bullet, buckshot, or shrapnel?  NO  11. Stent?  NO  12. Hx of Cancer? LUNG MET TO LIVER  13. Kidney or Liver disease?  LIVER CANCER  14. Hx of Lupus, Rheumatoid Arthritis or Scleroderma? NO  15. IV Antibiotics or long term use of NSAIDS?  NO   16. HX of Hypertension?  NO  17. Diabetes?  NO  18. Allergy to contrast?  NO

## 2015-12-23 ENCOUNTER — Other Ambulatory Visit: Payer: Self-pay | Admitting: Medical Oncology

## 2015-12-23 DIAGNOSIS — C3490 Malignant neoplasm of unspecified part of unspecified bronchus or lung: Secondary | ICD-10-CM

## 2015-12-23 DIAGNOSIS — C3491 Malignant neoplasm of unspecified part of right bronchus or lung: Secondary | ICD-10-CM

## 2015-12-23 DIAGNOSIS — Z95828 Presence of other vascular implants and grafts: Secondary | ICD-10-CM

## 2015-12-23 MED ORDER — OXYCODONE HCL 5 MG PO TABS: ORAL_TABLET | ORAL | 0 refills | Status: DC

## 2015-12-25 ENCOUNTER — Ambulatory Visit (HOSPITAL_BASED_OUTPATIENT_CLINIC_OR_DEPARTMENT_OTHER): Payer: Medicare Other | Admitting: Internal Medicine

## 2015-12-25 ENCOUNTER — Encounter: Payer: Self-pay | Admitting: Internal Medicine

## 2015-12-25 ENCOUNTER — Other Ambulatory Visit (HOSPITAL_BASED_OUTPATIENT_CLINIC_OR_DEPARTMENT_OTHER): Payer: Medicare Other

## 2015-12-25 ENCOUNTER — Ambulatory Visit: Payer: Medicare Other

## 2015-12-25 ENCOUNTER — Ambulatory Visit (HOSPITAL_BASED_OUTPATIENT_CLINIC_OR_DEPARTMENT_OTHER): Payer: Medicare Other

## 2015-12-25 DIAGNOSIS — F329 Major depressive disorder, single episode, unspecified: Secondary | ICD-10-CM | POA: Diagnosis not present

## 2015-12-25 DIAGNOSIS — R634 Abnormal weight loss: Secondary | ICD-10-CM

## 2015-12-25 DIAGNOSIS — C342 Malignant neoplasm of middle lobe, bronchus or lung: Secondary | ICD-10-CM

## 2015-12-25 DIAGNOSIS — Z5111 Encounter for antineoplastic chemotherapy: Secondary | ICD-10-CM

## 2015-12-25 DIAGNOSIS — C3491 Malignant neoplasm of unspecified part of right bronchus or lung: Secondary | ICD-10-CM

## 2015-12-25 DIAGNOSIS — C787 Secondary malignant neoplasm of liver and intrahepatic bile duct: Secondary | ICD-10-CM

## 2015-12-25 DIAGNOSIS — C3431 Malignant neoplasm of lower lobe, right bronchus or lung: Secondary | ICD-10-CM

## 2015-12-25 DIAGNOSIS — D696 Thrombocytopenia, unspecified: Secondary | ICD-10-CM

## 2015-12-25 DIAGNOSIS — R530 Neoplastic (malignant) related fatigue: Secondary | ICD-10-CM | POA: Diagnosis not present

## 2015-12-25 DIAGNOSIS — C786 Secondary malignant neoplasm of retroperitoneum and peritoneum: Secondary | ICD-10-CM | POA: Diagnosis not present

## 2015-12-25 DIAGNOSIS — Z95828 Presence of other vascular implants and grafts: Secondary | ICD-10-CM

## 2015-12-25 DIAGNOSIS — C799 Secondary malignant neoplasm of unspecified site: Secondary | ICD-10-CM

## 2015-12-25 DIAGNOSIS — C3490 Malignant neoplasm of unspecified part of unspecified bronchus or lung: Secondary | ICD-10-CM

## 2015-12-25 LAB — COMPREHENSIVE METABOLIC PANEL
ALT: 60 U/L — AB (ref 0–55)
ANION GAP: 10 meq/L (ref 3–11)
AST: 76 U/L — ABNORMAL HIGH (ref 5–34)
Albumin: 3.1 g/dL — ABNORMAL LOW (ref 3.5–5.0)
Alkaline Phosphatase: 288 U/L — ABNORMAL HIGH (ref 40–150)
BUN: 16.7 mg/dL (ref 7.0–26.0)
CHLORIDE: 107 meq/L (ref 98–109)
CO2: 22 meq/L (ref 22–29)
Calcium: 9.2 mg/dL (ref 8.4–10.4)
Creatinine: 1.4 mg/dL — ABNORMAL HIGH (ref 0.7–1.3)
EGFR: 52 mL/min/{1.73_m2} — AB (ref >90)
Glucose: 156 mg/dl — ABNORMAL HIGH (ref 70–140)
POTASSIUM: 4 meq/L (ref 3.5–5.1)
Sodium: 138 mEq/L (ref 136–145)
Total Bilirubin: 0.31 mg/dL (ref 0.20–1.20)
Total Protein: 6.9 g/dL (ref 6.4–8.3)

## 2015-12-25 LAB — CBC WITH DIFFERENTIAL/PLATELET
BASO%: 0.4 % (ref 0.0–2.0)
Basophils Absolute: 0 10*3/uL (ref 0.0–0.1)
EOS%: 1.5 % (ref 0.0–7.0)
Eosinophils Absolute: 0 10*3/uL (ref 0.0–0.5)
HCT: 30.5 % — ABNORMAL LOW (ref 38.4–49.9)
HGB: 10.1 g/dL — ABNORMAL LOW (ref 13.0–17.1)
LYMPH%: 20 % (ref 14.0–49.0)
MCH: 31.4 pg (ref 27.2–33.4)
MCHC: 33.1 g/dL (ref 32.0–36.0)
MCV: 94.9 fL (ref 79.3–98.0)
MONO#: 0.7 10*3/uL (ref 0.1–0.9)
MONO%: 23.3 % — ABNORMAL HIGH (ref 0.0–14.0)
NEUT#: 1.8 10*3/uL (ref 1.5–6.5)
NEUT%: 54.8 % (ref 39.0–75.0)
PLATELETS: 63 10*3/uL — AB (ref 140–400)
RBC: 3.21 10*6/uL — AB (ref 4.20–5.82)
RDW: 18.4 % — ABNORMAL HIGH (ref 11.0–14.6)
WBC: 3.2 10*3/uL — ABNORMAL LOW (ref 4.0–10.3)
lymph#: 0.6 10*3/uL — ABNORMAL LOW (ref 0.9–3.3)

## 2015-12-25 LAB — MAGNESIUM: Magnesium: 1.8 mg/dl (ref 1.5–2.5)

## 2015-12-25 MED ORDER — DRONABINOL 2.5 MG PO CAPS: 2.5000 mg | ORAL_CAPSULE | Freq: Two times a day (BID) | ORAL | 0 refills | Status: DC

## 2015-12-25 MED ORDER — SODIUM CHLORIDE 0.9% FLUSH
10.0000 mL | INTRAVENOUS | Status: DC | PRN
  Administered 2015-12-25: 10 mL
  Filled 2015-12-25: qty 10

## 2015-12-25 MED ORDER — LIDOCAINE-PRILOCAINE 2.5-2.5 % EX CREA: TOPICAL_CREAM | CUTANEOUS | 2 refills | Status: AC

## 2015-12-25 MED ORDER — ATROPINE SULFATE 1 MG/ML IJ SOLN
0.5000 mg | Freq: Once | INTRAMUSCULAR | Status: AC | PRN
  Administered 2015-12-25: 0.5 mg via INTRAVENOUS

## 2015-12-25 MED ORDER — SODIUM CHLORIDE 0.9 % IV SOLN
Freq: Once | INTRAVENOUS | Status: AC
  Administered 2015-12-25: 13:00:00 via INTRAVENOUS

## 2015-12-25 MED ORDER — PALONOSETRON HCL INJECTION 0.25 MG/5ML
INTRAVENOUS | Status: AC
  Filled 2015-12-25: qty 5

## 2015-12-25 MED ORDER — SODIUM CHLORIDE 0.9 % IJ SOLN
10.0000 mL | INTRAMUSCULAR | Status: DC | PRN
  Administered 2015-12-25: 10 mL via INTRAVENOUS
  Filled 2015-12-25: qty 10

## 2015-12-25 MED ORDER — OXYCODONE HCL 5 MG PO TABS: ORAL_TABLET | ORAL | 0 refills | Status: DC

## 2015-12-25 MED ORDER — SODIUM CHLORIDE 0.9 % IV SOLN
25.0000 mg/m2 | Freq: Once | INTRAVENOUS | Status: AC
  Administered 2015-12-25: 45 mg via INTRAVENOUS
  Filled 2015-12-25: qty 45

## 2015-12-25 MED ORDER — POTASSIUM CHLORIDE 2 MEQ/ML IV SOLN
Freq: Once | INTRAVENOUS | Status: AC
  Administered 2015-12-25: 10:00:00 via INTRAVENOUS
  Filled 2015-12-25: qty 10

## 2015-12-25 MED ORDER — SODIUM CHLORIDE 0.9 % IV SOLN
Freq: Once | INTRAVENOUS | Status: AC
  Administered 2015-12-25: 13:00:00 via INTRAVENOUS
  Filled 2015-12-25: qty 5

## 2015-12-25 MED ORDER — IRINOTECAN HCL CHEMO INJECTION 100 MG/5ML
50.0000 mg/m2 | Freq: Once | INTRAVENOUS | Status: AC
  Administered 2015-12-25: 100 mg via INTRAVENOUS
  Filled 2015-12-25: qty 5

## 2015-12-25 MED ORDER — PALONOSETRON HCL INJECTION 0.25 MG/5ML
0.2500 mg | Freq: Once | INTRAVENOUS | Status: AC
  Administered 2015-12-25: 0.25 mg via INTRAVENOUS

## 2015-12-25 MED ORDER — ATROPINE SULFATE 1 MG/ML IJ SOLN
INTRAMUSCULAR | Status: AC
  Filled 2015-12-25: qty 1

## 2015-12-25 MED ORDER — HEPARIN SOD (PORK) LOCK FLUSH 100 UNIT/ML IV SOLN
500.0000 [IU] | Freq: Once | INTRAVENOUS | Status: AC | PRN
  Administered 2015-12-25: 500 [IU]
  Filled 2015-12-25: qty 5

## 2015-12-25 NOTE — Progress Notes (Signed)
Ok to treat per Dr Julien Nordmann with platelets of 63.

## 2015-12-25 NOTE — Progress Notes (Signed)
[facility] [phone]   [phone]  OFFICE PROGRESS NOTE  [name], MD [place] [address]  DIAGNOSIS: Extensive stage (T3, N3, M1b) small cell lung cancer presented with large right middle and lower lobe mass in addition to extensive mediastinal lymphadenopathy, right pleural effusion as well as extensive liver metastasis and retroperitoneal lymphadenopathy diagnosed in April 2017.  PRIOR THERAPY:  1) Systemic chemotherapy with reduced dose carboplatin for AUC of 3 on day 1 and etoposide 60 MG/M2 on days 1, 2 and 3. First dose was given at [facility]. Status post 4 cycles. Starting from cycle #3 his carboplatin is for AUC of 5 and etoposide 100 MG/M2. Last dose of chemotherapy was given on 08/04/2015 discontinued secondary to disease progression. 2) prophylactic cranial irradiation as well as palliative radiotherapy to the chest under the care of Dr. Tammi Klippel completed 10/03/2015.  CURRENT THERAPY: Systemic chemotherapy with cisplatin 30 MG/M2 and irinotecan 65 MG/M2 on days 1 and 8 every 3 weeks. Status post 3 cycles. Starting from cycle #4 cisplatin will be 25 MG/M2 and irinotecan 50 MG/M2  INTERVAL HISTORY: Shane Vasquez 65 y.o. male returns to the clinic today for follow-up visit accompanied by his wife. The patient is feeling much better today with no specific complaints except for fatigue and weight loss. He denied having any significant nausea, vomiting, diarrhea or constipation. He has no chest pain, shortness of breath, cough or hemoptysis. He is tolerating his treatment with cisplatin and irinotecan fairly well. The patient denied having any headache or visual changes. He missed day 15 of cycle #3 secondary to thrombocytopenia. He also received 2 units of PRBCs transfusion secondary to chemotherapy-induced anemia. He is here today for evaluation before starting cycle #4.  MEDICAL HISTORY: Past  Medical History:  Diagnosis Date  . CAP (community acquired pneumonia)    LLL 07/2014  . CERVICAL RADICULOPATHY, RIGHT 08/12/2009  . DIVERTICULOSIS, COLON 08/15/2006  . Empyema lung ([place])    2016 on left  . GERD 08/15/2006  . HYPERLIPIDEMIA 08/15/2006  . HYPERTENSION 08/15/2006  . INTERNAL HEMORRHOIDS 02/29/2008  . Lung cancer ([place])    metastatic small cell lung cancer  . SKIN CANCER, HX OF 08/15/2006    ALLERGIES:  is allergic to bee venom.  MEDICATIONS:  Current Outpatient Prescriptions  Medication Sig Dispense Refill  . acetaminophen (TYLENOL) 500 MG tablet Take 1,000 mg by mouth every 8 (eight) hours as needed.    Marland Kitchen albuterol (PROVENTIL HFA;VENTOLIN HFA) 108 (90 Base) MCG/ACT inhaler Inhale 1-2 puffs into the lungs every 6 (six) hours as needed for wheezing or shortness of breath. 1 Inhaler 2  . chlorproMAZINE (THORAZINE) 25 MG tablet Take 1 tablet (25 mg total) by mouth 3 (three) times daily as needed. for  hiccoughs 30 tablet 0  . diphenhydramine-acetaminophen (TYLENOL PM) 25-500 MG TABS tablet Take 1 tablet by mouth at bedtime as needed.    Marland Kitchen EPINEPHrine 0.3 mg/0.3 mL IJ SOAJ injection Inject 0.3 mLs (0.3 mg total) into the muscle once. 1 Device 2  . lidocaine-prilocaine (EMLA) cream Apply to port -a-cath 1-2 hours prior to access. 30 g 2  . mirtazapine (REMERON) 30 MG tablet Take 1 tablet (30 mg total) by mouth at bedtime. 30 tablet 2  . ondansetron (ZOFRAN) 8 MG tablet Take 1 tablet (8 mg total) by mouth every 8 (eight) hours as needed for nausea or vomiting. 30 tablet 1  . oxyCODONE (OXY IR/ROXICODONE)  5 MG immediate release tablet Take 1-2 tabs PO Q 4-6 hours PRN pain. 60 tablet 0  . oxymetazoline (AFRIN) 0.05 % nasal spray Place 1 spray into both nostrils 2 (two) times daily.    . polyethylene glycol (MIRALAX / GLYCOLAX) packet Take 17 g by mouth daily as needed for mild constipation.    . prochlorperazine (COMPAZINE) 10 MG tablet Take 1 tablet (10 mg total) by mouth every 6  (six) hours as needed for nausea or vomiting. DO  NOT  TAKE  WITH  THORAZINE. 30 tablet 0  . senna (SENOKOT) 8.6 MG TABS tablet Take 1 tablet (8.6 mg total) by mouth daily.  0   No current facility-administered medications for this visit.    Facility-Administered Medications Ordered in Other Visits  Medication Dose Route Frequency Provider Last Rate Last Dose  . 0.9 %  sodium chloride infusion   Intravenous Once Curt Bears, MD      . sodium chloride 0.9 % injection 10 mL  10 mL Intravenous PRN Curt Bears, MD   10 mL at 12/04/15 0850  . sodium chloride flush (NS) 0.9 % injection 10 mL  10 mL Intracatheter PRN Curt Bears, MD   10 mL at 10/17/15 1829    SURGICAL HISTORY:  Past Surgical History:  Procedure Laterality Date  . TONSILLECTOMY    . VIDEO ASSISTED THORACOSCOPY (VATS)/EMPYEMA Left 07/27/2014   Procedure: VIDEO ASSISTED THORACOSCOPY (VATS)/EMPYEMA Left, drainage pleural effusion.;  Surgeon: [name], MD;  Location: [facility];  Service: Thoracic;  Laterality: Left;  Marland Kitchen VIDEO BRONCHOSCOPY N/A 07/27/2014   Procedure: VIDEO BRONCHOSCOPY;  Surgeon: [name], MD;  Location: [facility] OR;  Service: Thoracic;  Laterality: N/A;  . VIDEO BRONCHOSCOPY Bilateral 05/20/2015   Procedure: VIDEO BRONCHOSCOPY WITH FLUORO;  Surgeon: Collene Gobble, MD;  Location: [facility];  Service: Cardiopulmonary;  Laterality: Bilateral;    REVIEW OF SYSTEMS:  Constitutional: positive for fatigue Eyes: negative Ears, nose, mouth, throat, and face: negative Respiratory: negative Cardiovascular: negative Gastrointestinal: negative Genitourinary:negative Integument/breast: negative Hematologic/lymphatic: negative Musculoskeletal:negative Neurological: negative Behavioral/Psych: negative Endocrine: negative Allergic/Immunologic: negative   PHYSICAL EXAMINATION: General appearance: alert, cooperative, fatigued and no distress Head: Normocephalic, without obvious abnormality,  atraumatic Neck: no adenopathy, no JVD, supple, symmetrical, trachea midline and thyroid not enlarged, symmetric, no tenderness/mass/nodules Lymph nodes: Cervical, supraclavicular, and axillary nodes normal. Resp: clear to auscultation bilaterally Back: symmetric, no curvature. ROM normal. No CVA tenderness. Cardio: regular rate and rhythm, S1, S2 normal, no murmur, click, rub or gallop GI: soft, non-tender; bowel sounds normal; no masses,  no organomegaly Extremities: extremities normal, atraumatic, no cyanosis or edema Neurologic: Alert and oriented X 3, normal strength and tone. Normal symmetric reflexes. Normal coordination and gait  ECOG PERFORMANCE STATUS: 1 - Symptomatic but completely ambulatory  Blood pressure 123/74, pulse 84, temperature 98.5 F (36.9 C), temperature source Oral, resp. rate 16, weight 135 lb 9.6 oz (61.5 kg), SpO2 99 %.  LABORATORY DATA: Lab Results  Component Value Date   WBC 3.2 (L) 12/25/2015   HGB 10.1 (L) 12/25/2015   HCT 30.5 (L) 12/25/2015   MCV 94.9 12/25/2015   PLT 63 (L) 12/25/2015      Chemistry      Component Value Date/Time   NA 140 12/11/2015 0746   K 4.0 12/11/2015 0746   CL 103 10/27/2015 1216   CO2 21 (L) 12/11/2015 0746   BUN 22.4 12/11/2015 0746   CREATININE 1.0 12/11/2015 0746      Component  Value Date/Time   CALCIUM 8.6 12/11/2015 0746   ALKPHOS 186 (H) 12/11/2015 0746   AST 24 12/11/2015 0746   ALT 17 12/11/2015 0746   BILITOT <0.22 12/11/2015 0746       RADIOGRAPHIC STUDIES: Ct Chest W Contrast  Result Date: 12/01/2015 CLINICAL DATA:  Chemotherapy in progress for small cell right-sided lung cancer. Restaging assessment. EXAM: CT CHEST, ABDOMEN, AND PELVIS WITH CONTRAST TECHNIQUE: Multidetector CT imaging of the chest, abdomen and pelvis was performed following the standard protocol during bolus administration of intravenous contrast. CONTRAST:  122m ISOVUE-300 IOPAMIDOL (ISOVUE-300) INJECTION 61% COMPARISON:   Multiple exams, including 09/18/2015 FINDINGS: CT CHEST FINDINGS Cardiovascular: Mild left anterior descending coronary artery atherosclerotic calcification. Mediastinum/Nodes: Right paratracheal lymph node 0.4 cm in short axis on image 16/2, formerly 1.4 cm. Right lower paratracheal lymph node 0.6 cm in short axis on image 24/2, formerly 2.8 cm. Subcarinal lymph node short axis diameter 0.8 cm in short axis, formerly 2.4 cm. Right hilar and infrahilar adenopathy are also markedly improved. Lungs/Pleura: Moderate right-sided pleural effusion with inferior pleural tumor along the dependent portions of the right pleural space. However, this does appear improved, measuring only about 5 mm and rind like thickness as opposed to prior measurements of up to 10 mm. Nodularity along the pleural adipose tissue on the right is also reduced. Biapical pleuroparenchymal scarring. Centrilobular emphysema. The previously occluded right middle lobe bronchus is now patent although there still is a considerable amount of right middle lobe volume loss/ atelectasis, involving almost all the right middle lobe. Airway thickening noted bilaterally. Scarring in the lingula and left lower lobe. Musculoskeletal: Subtle heterogeneity of density in various vertebra and in the ribs, compatible with osseous metastatic disease. CT ABDOMEN PELVIS FINDINGS Hepatobiliary: Innumerable metastatic lesions are scattered throughout all segments of the liver. The margins of these lesions are less sharply defined than on the prior exam. Dominant right hepatic lobe lesion measures 3.5 cm in the anterior- posterior dimension, previously 3.3 cm. This lesion demonstrates central necrosis although the smaller lesions do not. Some other lesions are reduced in size, for example a segment 4 a lesion measuring 2 cm in diameter today measured 2.7 cm previously. Although the overall appearance is generally next there is seen to be more lesions and overall I feel that  the tumor burden in the liver is worsened. Pancreas: Unremarkable Spleen: Unremarkable Adrenals/Urinary Tract: 1.3 cm sharply defined fluid density lesion in the left mid kidney, not appreciably changed from prior, favoring cyst. Stomach/Bowel: Sigmoid colon diverticulosis. Vascular/Lymphatic: Aortoiliac atherosclerotic vascular disease. Notable retrocrural lymph nodes including a 8 mm in short axis node on image 55/2. Lymph node to the right of the IVC at the level the right renal artery measures 1.0 cm in short axis, previously 0.7 cm. Portacaval lymph node measures 1.7 cm in short axis, formerly 0.7 cm in short axis. Reproductive: Unremarkable Other: No supplemental non-categorized findings. Musculoskeletal: Remote mild superior endplate compression fracture of the L1 vertebral body, 30% loss of vertebral body height without significant bony retropulsion. This is stable compared to the prior exam. 1.2 cm sclerotic lesion of the right iliac crest. Similar mild sclerosis in the left iliac crest. Faint sclerosis at the right ischial tuberosity. IMPRESSION: 1. Mixed response, with significant improvement in malignant involvement in the thorax including the pleural metastatic disease and adenopathy; but with some new and increasing hepatic metastatic lesions, as well as some progressive adenopathy in the abdomen compatible with metastatic disease. The primary lesion in the  right infrahilar region is markedly improved but difficult to separately measure from the surrounding atelectasis. 2. There is some scattered faint areas of sclerosis in the ribs, thoracic vertebra, and bony pelvis, suspicious for subtle osseous metastatic disease. Consider bone scan or nuclear medicine PET-CT for further characterization. 3. Other imaging findings of potential clinical significance: Coronary atherosclerosis. Aortoiliac atherosclerotic vascular disease. Moderate-sized right pleural effusion. Remote superior endplate compression  fracture at L1. Sigmoid diverticulosis. Electronically Signed   By: Van Clines M.D.   On: 12/01/2015 10:37   Ct Abdomen Pelvis W Contrast  Result Date: 12/01/2015 CLINICAL DATA:  Chemotherapy in progress for small cell right-sided lung cancer. Restaging assessment. EXAM: CT CHEST, ABDOMEN, AND PELVIS WITH CONTRAST TECHNIQUE: Multidetector CT imaging of the chest, abdomen and pelvis was performed following the standard protocol during bolus administration of intravenous contrast. CONTRAST:  171m ISOVUE-300 IOPAMIDOL (ISOVUE-300) INJECTION 61% COMPARISON:  Multiple exams, including 09/18/2015 FINDINGS: CT CHEST FINDINGS Cardiovascular: Mild left anterior descending coronary artery atherosclerotic calcification. Mediastinum/Nodes: Right paratracheal lymph node 0.4 cm in short axis on image 16/2, formerly 1.4 cm. Right lower paratracheal lymph node 0.6 cm in short axis on image 24/2, formerly 2.8 cm. Subcarinal lymph node short axis diameter 0.8 cm in short axis, formerly 2.4 cm. Right hilar and infrahilar adenopathy are also markedly improved. Lungs/Pleura: Moderate right-sided pleural effusion with inferior pleural tumor along the dependent portions of the right pleural space. However, this does appear improved, measuring only about 5 mm and rind like thickness as opposed to prior measurements of up to 10 mm. Nodularity along the pleural adipose tissue on the right is also reduced. Biapical pleuroparenchymal scarring. Centrilobular emphysema. The previously occluded right middle lobe bronchus is now patent although there still is a considerable amount of right middle lobe volume loss/ atelectasis, involving almost all the right middle lobe. Airway thickening noted bilaterally. Scarring in the lingula and left lower lobe. Musculoskeletal: Subtle heterogeneity of density in various vertebra and in the ribs, compatible with osseous metastatic disease. CT ABDOMEN PELVIS FINDINGS Hepatobiliary: Innumerable  metastatic lesions are scattered throughout all segments of the liver. The margins of these lesions are less sharply defined than on the prior exam. Dominant right hepatic lobe lesion measures 3.5 cm in the anterior- posterior dimension, previously 3.3 cm. This lesion demonstrates central necrosis although the smaller lesions do not. Some other lesions are reduced in size, for example a segment 4 a lesion measuring 2 cm in diameter today measured 2.7 cm previously. Although the overall appearance is generally next there is seen to be more lesions and overall I feel that the tumor burden in the liver is worsened. Pancreas: Unremarkable Spleen: Unremarkable Adrenals/Urinary Tract: 1.3 cm sharply defined fluid density lesion in the left mid kidney, not appreciably changed from prior, favoring cyst. Stomach/Bowel: Sigmoid colon diverticulosis. Vascular/Lymphatic: Aortoiliac atherosclerotic vascular disease. Notable retrocrural lymph nodes including a 8 mm in short axis node on image 55/2. Lymph node to the right of the IVC at the level the right renal artery measures 1.0 cm in short axis, previously 0.7 cm. Portacaval lymph node measures 1.7 cm in short axis, formerly 0.7 cm in short axis. Reproductive: Unremarkable Other: No supplemental non-categorized findings. Musculoskeletal: Remote mild superior endplate compression fracture of the L1 vertebral body, 30% loss of vertebral body height without significant bony retropulsion. This is stable compared to the prior exam. 1.2 cm sclerotic lesion of the right iliac crest. Similar mild sclerosis in the left iliac crest. Faint sclerosis at  the right ischial tuberosity. IMPRESSION: 1. Mixed response, with significant improvement in malignant involvement in the thorax including the pleural metastatic disease and adenopathy; but with some new and increasing hepatic metastatic lesions, as well as some progressive adenopathy in the abdomen compatible with metastatic disease. The  primary lesion in the right infrahilar region is markedly improved but difficult to separately measure from the surrounding atelectasis. 2. There is some scattered faint areas of sclerosis in the ribs, thoracic vertebra, and bony pelvis, suspicious for subtle osseous metastatic disease. Consider bone scan or nuclear medicine PET-CT for further characterization. 3. Other imaging findings of potential clinical significance: Coronary atherosclerosis. Aortoiliac atherosclerotic vascular disease. Moderate-sized right pleural effusion. Remote superior endplate compression fracture at L1. Sigmoid diverticulosis. Electronically Signed   By: Van Clines M.D.   On: 12/01/2015 10:37    ASSESSMENT AND PLAN: This is a very pleasant 65 years old white male recently diagnosed with extensive stage small cell lung cancer status post 4 cycles of chemotherapy with carboplatin and etoposide. The patient is tolerating his treatment fairly well with no significant adverse effects.  Initial imaging studies showed improvement in his disease and the patient was considered for enrollment in a maintenance clinical trial with ROVA-T but unfortunately he became symptomatic and repeat imaging studies showed evidence for disease progression and the patient was not eligible for the trial. He completed a course of prophylactic cranial irradiation as well as palliative radiotherapy to the chest under the care of Dr. Tammi Klippel. He is currently undergoing second line systemic chemotherapy with cisplatin and irinotecan on days 1 and 8 every 3 weeks status post 3 cycles.  He is tolerating the treatment well except for fatigue and thrombocytopenia. Unfortunately his platelets count are still low. This is likely secondary to his disease. I gave the patient the option of waiting until improvement of his platelets count versus proceeding with systemic chemotherapy but at a reduced dose of cisplatin 25 MG/M2 and irinotecan 50 MG/M2 on days 1  and 8 every 3 weeks. The patient his wife would like to proceed with the treatment and understanding the risk of further thrombocytopenia and bleeding risk. He will start cycle #4 today. For depression, I will increase the dose of Remeron to 30 mg by mouth daily at bedtime. For the weight loss, I started the patient on Marinol 2.5 mg by mouth twice a day. I also gave him a refill of Percocet. He would come back for follow-up visit in 3 weeks for evaluation and management of any adverse effect of his treatment before starting cycle #5. He was advised to call immediately if he has any concerning symptoms in the interval. The patient voices understanding of current disease status and treatment options and is in agreement with the current care plan.  All questions were answered. The patient knows to call the clinic with any problems, questions or concerns. We can certainly see the patient much sooner if necessary.  Disclaimer: This note was dictated with voice recognition software. Similar sounding words can inadvertently be transcribed and may not be corrected upon review.

## 2015-12-31 ENCOUNTER — Ambulatory Visit (HOSPITAL_COMMUNITY)
Admission: RE | Admit: 2015-12-31 | Discharge: 2015-12-31 | Disposition: A | Discharge status: Home / Self Care | Payer: Medicare Other | Source: Ambulatory Visit | Attending: Radiation Oncology | Admitting: Radiation Oncology

## 2015-12-31 DIAGNOSIS — C7949 Secondary malignant neoplasm of other parts of nervous system: Secondary | ICD-10-CM | POA: Diagnosis present

## 2015-12-31 DIAGNOSIS — C7931 Secondary malignant neoplasm of brain: Secondary | ICD-10-CM | POA: Diagnosis not present

## 2015-12-31 MED ORDER — GADOBENATE DIMEGLUMINE 529 MG/ML IV SOLN
12.0000 mL | Freq: Once | INTRAVENOUS | Status: AC | PRN
  Administered 2015-12-31: 12 mL via INTRAVENOUS

## 2016-01-01 ENCOUNTER — Other Ambulatory Visit (HOSPITAL_BASED_OUTPATIENT_CLINIC_OR_DEPARTMENT_OTHER): Payer: Medicare Other

## 2016-01-01 ENCOUNTER — Ambulatory Visit (HOSPITAL_BASED_OUTPATIENT_CLINIC_OR_DEPARTMENT_OTHER): Payer: Medicare Other

## 2016-01-01 VITALS — BP 123/75 | HR 84 | Temp 98.3°F | Resp 16

## 2016-01-01 DIAGNOSIS — C3491 Malignant neoplasm of unspecified part of right bronchus or lung: Secondary | ICD-10-CM | POA: Diagnosis not present

## 2016-01-01 DIAGNOSIS — Z5111 Encounter for antineoplastic chemotherapy: Secondary | ICD-10-CM | POA: Diagnosis not present

## 2016-01-01 DIAGNOSIS — C3431 Malignant neoplasm of lower lobe, right bronchus or lung: Secondary | ICD-10-CM | POA: Diagnosis not present

## 2016-01-01 DIAGNOSIS — C342 Malignant neoplasm of middle lobe, bronchus or lung: Secondary | ICD-10-CM | POA: Diagnosis not present

## 2016-01-01 DIAGNOSIS — Z23 Encounter for immunization: Secondary | ICD-10-CM

## 2016-01-01 LAB — CBC WITH DIFFERENTIAL/PLATELET
BASO%: 0.3 % (ref 0.0–2.0)
BASOS ABS: 0 10*3/uL (ref 0.0–0.1)
EOS ABS: 0 10*3/uL (ref 0.0–0.5)
EOS%: 0.7 % (ref 0.0–7.0)
HCT: 26.4 % — ABNORMAL LOW (ref 38.4–49.9)
HEMOGLOBIN: 8.8 g/dL — AB (ref 13.0–17.1)
LYMPH%: 23.5 % (ref 14.0–49.0)
MCH: 31.5 pg (ref 27.2–33.4)
MCHC: 33.3 g/dL (ref 32.0–36.0)
MCV: 94.6 fL (ref 79.3–98.0)
MONO#: 0.5 10*3/uL (ref 0.1–0.9)
MONO%: 17.1 % — AB (ref 0.0–14.0)
NEUT#: 1.7 10*3/uL (ref 1.5–6.5)
NEUT%: 58.4 % (ref 39.0–75.0)
Platelets: 63 10*3/uL — ABNORMAL LOW (ref 140–400)
RBC: 2.79 10*6/uL — ABNORMAL LOW (ref 4.20–5.82)
RDW: 17.9 % — AB (ref 11.0–14.6)
WBC: 2.9 10*3/uL — ABNORMAL LOW (ref 4.0–10.3)
lymph#: 0.7 10*3/uL — ABNORMAL LOW (ref 0.9–3.3)

## 2016-01-01 LAB — COMPREHENSIVE METABOLIC PANEL
ALBUMIN: 3 g/dL — AB (ref 3.5–5.0)
ALK PHOS: 258 U/L — AB (ref 40–150)
ALT: 41 U/L (ref 0–55)
AST: 44 U/L — ABNORMAL HIGH (ref 5–34)
Anion Gap: 10 mEq/L (ref 3–11)
BUN: 22.9 mg/dL (ref 7.0–26.0)
CALCIUM: 9 mg/dL (ref 8.4–10.4)
CHLORIDE: 107 meq/L (ref 98–109)
CO2: 21 mEq/L — ABNORMAL LOW (ref 22–29)
Creatinine: 1.4 mg/dL — ABNORMAL HIGH (ref 0.7–1.3)
EGFR: 54 mL/min/{1.73_m2} — AB (ref >90)
GLUCOSE: 113 mg/dL (ref 70–140)
POTASSIUM: 4.3 meq/L (ref 3.5–5.1)
SODIUM: 137 meq/L (ref 136–145)
Total Bilirubin: 0.27 mg/dL (ref 0.20–1.20)
Total Protein: 6.7 g/dL (ref 6.4–8.3)

## 2016-01-01 LAB — MAGNESIUM: Magnesium: 2.1 mg/dl (ref 1.5–2.5)

## 2016-01-01 MED ORDER — PALONOSETRON HCL INJECTION 0.25 MG/5ML
0.2500 mg | Freq: Once | INTRAVENOUS | Status: AC
  Administered 2016-01-01: 0.25 mg via INTRAVENOUS

## 2016-01-01 MED ORDER — FAMOTIDINE 20 MG PO TABS
ORAL_TABLET | ORAL | Status: AC
  Filled 2016-01-01: qty 1

## 2016-01-01 MED ORDER — HEPARIN SOD (PORK) LOCK FLUSH 100 UNIT/ML IV SOLN
500.0000 [IU] | Freq: Once | INTRAVENOUS | Status: AC | PRN
  Administered 2016-01-01: 500 [IU]
  Filled 2016-01-01: qty 5

## 2016-01-01 MED ORDER — INFLUENZA VAC SPLIT QUAD 0.5 ML IM SUSY
0.5000 mL | PREFILLED_SYRINGE | Freq: Once | INTRAMUSCULAR | Status: AC
  Administered 2016-01-01: 0.5 mL via INTRAMUSCULAR
  Filled 2016-01-01: qty 0.5

## 2016-01-01 MED ORDER — FOSAPREPITANT DIMEGLUMINE INJECTION 150 MG
Freq: Once | INTRAVENOUS | Status: AC
  Administered 2016-01-01: 13:00:00 via INTRAVENOUS
  Filled 2016-01-01: qty 5

## 2016-01-01 MED ORDER — SODIUM CHLORIDE 0.9% FLUSH
10.0000 mL | INTRAVENOUS | Status: DC | PRN
  Administered 2016-01-01: 10 mL
  Filled 2016-01-01: qty 10

## 2016-01-01 MED ORDER — FAMOTIDINE 20 MG PO TABS
20.0000 mg | ORAL_TABLET | Freq: Once | ORAL | Status: AC
  Administered 2016-01-01: 20 mg via ORAL

## 2016-01-01 MED ORDER — SODIUM CHLORIDE 0.9 % IV SOLN
Freq: Once | INTRAVENOUS | Status: AC
  Administered 2016-01-01: 09:00:00 via INTRAVENOUS

## 2016-01-01 MED ORDER — ATROPINE SULFATE 1 MG/ML IJ SOLN
INTRAMUSCULAR | Status: AC
  Filled 2016-01-01: qty 1

## 2016-01-01 MED ORDER — IRINOTECAN HCL CHEMO INJECTION 100 MG/5ML
50.0000 mg/m2 | Freq: Once | INTRAVENOUS | Status: AC
  Administered 2016-01-01: 80 mg via INTRAVENOUS
  Filled 2016-01-01: qty 4

## 2016-01-01 MED ORDER — SODIUM CHLORIDE 0.9 % IV SOLN
25.0000 mg/m2 | Freq: Once | INTRAVENOUS | Status: AC
  Administered 2016-01-01: 45 mg via INTRAVENOUS
  Filled 2016-01-01: qty 45

## 2016-01-01 MED ORDER — PALONOSETRON HCL INJECTION 0.25 MG/5ML
INTRAVENOUS | Status: AC
  Filled 2016-01-01: qty 5

## 2016-01-01 MED ORDER — ATROPINE SULFATE 1 MG/ML IJ SOLN
0.5000 mg | Freq: Once | INTRAMUSCULAR | Status: AC | PRN
  Administered 2016-01-01: 0.5 mg via INTRAVENOUS

## 2016-01-01 MED ORDER — IRINOTECAN HCL CHEMO INJECTION 100 MG/5ML: 50.0000 mg/m2 | Freq: Once | INTRAVENOUS | Status: DC

## 2016-01-01 MED ORDER — DEXTROSE-NACL 5-0.45 % IV SOLN
Freq: Once | INTRAVENOUS | Status: AC
  Administered 2016-01-01: 10:00:00 via INTRAVENOUS
  Filled 2016-01-01: qty 10

## 2016-01-01 NOTE — Patient Instructions (Signed)
[facility] Discharge Instructions for Patients Receiving Chemotherapy  Today you received the following chemotherapy agents: Irinotecan and Cisplatin   To help prevent nausea and vomiting after your treatment, we encourage you to take your nausea medication as directed.    If you develop nausea and vomiting that is not controlled by your nausea medication, call the clinic.   BELOW ARE SYMPTOMS THAT SHOULD BE REPORTED IMMEDIATELY:  *FEVER GREATER THAN 100.5 F  *CHILLS WITH OR WITHOUT FEVER  NAUSEA AND VOMITING THAT IS NOT CONTROLLED WITH YOUR NAUSEA MEDICATION  *UNUSUAL SHORTNESS OF BREATH  *UNUSUAL BRUISING OR BLEEDING  TENDERNESS IN MOUTH AND THROAT WITH OR WITHOUT PRESENCE OF ULCERS  *URINARY PROBLEMS  *BOWEL PROBLEMS  UNUSUAL RASH Items with * indicate a potential emergency and should be followed up as soon as possible.  Feel free to call the clinic you have any questions or concerns. The clinic phone number is [phone].  Please show the CHEMO ALERT CARD at check-in to the Emergency Department and triage nurse.   

## 2016-01-01 NOTE — Progress Notes (Signed)
Per Dr. Julien Nordmann okay to treat with platelets of 63 and WBC of 2.9.

## 2016-01-01 NOTE — Progress Notes (Signed)
OK to run hydration fluids with Cisplatin per Dr. Julien Nordmann.

## 2016-01-05 ENCOUNTER — Encounter: Payer: Self-pay | Admitting: Radiation Oncology

## 2016-01-05 ENCOUNTER — Ambulatory Visit
Admission: RE | Admit: 2016-01-05 | Discharge: 2016-01-05 | Disposition: A | Discharge status: Home / Self Care | Payer: Medicare Other | Source: Ambulatory Visit | Attending: Radiation Oncology | Admitting: Radiation Oncology

## 2016-01-05 VITALS — BP 150/82 | HR 97 | Resp 16 | Wt 133.6 lb

## 2016-01-05 DIAGNOSIS — Z87891 Personal history of nicotine dependence: Secondary | ICD-10-CM | POA: Diagnosis not present

## 2016-01-05 DIAGNOSIS — Z923 Personal history of irradiation: Secondary | ICD-10-CM | POA: Diagnosis not present

## 2016-01-05 DIAGNOSIS — C3491 Malignant neoplasm of unspecified part of right bronchus or lung: Secondary | ICD-10-CM | POA: Diagnosis present

## 2016-01-05 DIAGNOSIS — Z79899 Other long term (current) drug therapy: Secondary | ICD-10-CM | POA: Insufficient documentation

## 2016-01-05 DIAGNOSIS — Z5189 Encounter for other specified aftercare: Secondary | ICD-10-CM | POA: Insufficient documentation

## 2016-01-05 DIAGNOSIS — C3490 Malignant neoplasm of unspecified part of unspecified bronchus or lung: Secondary | ICD-10-CM

## 2016-01-05 NOTE — Progress Notes (Signed)
Weight and vitals stable. Reports intermittent thoracic spine pain that radiates around his ribs 5 on a scale of 0-10. Reports taking oxycodone to manage this pain. Denies dysphagia, SOB, or cough. Reports occasional headaches. Reports intermittent nausea managed with compazine and zofran. Reports unchanged intermittent tinnitus. Denies visual changes. Denies recent falls. Steady gait noted.   BP (!) 150/82 (BP Location: Left Arm, Patient Position: Sitting, Cuff Size: Normal)   Pulse 97   Resp 16   Wt 133 lb 9.6 oz (60.6 kg)   SpO2 100%   BMI 19.73 kg/m  Wt Readings from Last 3 Encounters:  01/05/16 133 lb 9.6 oz (60.6 kg)  12/25/15 135 lb 9.6 oz (61.5 kg)  12/04/15 138 lb 6.4 oz (62.8 kg)

## 2016-01-05 NOTE — Progress Notes (Signed)
Radiation Oncology         [phone] ________________________________  Name: Shane Vasquez MRN: [id number]  Date: 01/05/2016  DOB: 12-12-1950  Follow-Up Visit Note  CC: [name], MD  Marletta Lor, MD  Diagnosis:  65 y.o.  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    ICD-9-CM ICD-10-CM   1. Primary malignant neoplasm of lung metastatic to other site, unspecified laterality (HCC) 162.9 C34.90   2. Small cell carcinoma of lung, right (HCC) 162.9 C34.91     Interval Since Last Radiation:  3 months  09/17/15-10/03/15 PCI whole brain treated with 25 Gy in 10 fractions, chest was treated to 30 Gy in 10 fractions  Narrative:  The patient returns today for routine follow-up. In summary, this is very pleasant patient with a history of extensive small cell carcinoma of the lung, who received PCI. He is currently receiving cisplatin 30 MG/M2 and irinotecan 65 MG/M2, and is tolerating this well.   On review of systems, the patient reports intermittent thoracic spin pain 5/10 that radiates around his ribs. He reports taking Oxycodone to manage his pain. The patient reports occasional headaches but no progressive visual or auditory changes. He manages his nausea with compazine and zofran, and denies any abdominal pain, bowel or bladder dysfunction. He reports unchanged intermittent tinnitus. Patient denies dysphagia, SOB, cough, visual changes, or recent falls. Steady gait noted. A complete review of systems is obtained and is otherwise negative.                        Past Medical History:  Past Medical History:  Diagnosis Date  . CAP (community acquired pneumonia)    LLL 07/2014  . CERVICAL RADICULOPATHY, RIGHT 08/12/2009  . DIVERTICULOSIS, COLON 08/15/2006  . Empyema lung ([place])    2016 on left  . GERD 08/15/2006  . HYPERLIPIDEMIA 08/15/2006  . HYPERTENSION 08/15/2006  . INTERNAL HEMORRHOIDS 02/29/2008  . Lung cancer  ([place])    metastatic small cell lung cancer  . SKIN CANCER, HX OF 08/15/2006    Past Surgical History: Past Surgical History:  Procedure Laterality Date  . TONSILLECTOMY    . VIDEO ASSISTED THORACOSCOPY (VATS)/EMPYEMA Left 07/27/2014   Procedure: VIDEO ASSISTED THORACOSCOPY (VATS)/EMPYEMA Left, drainage pleural effusion.;  Surgeon: [name], MD;  Location: [place];  Service: Thoracic;  Laterality: Left;  Marland Kitchen VIDEO BRONCHOSCOPY N/A 07/27/2014   Procedure: VIDEO BRONCHOSCOPY;  Surgeon: [name], MD;  Location: [facility] OR;  Service: Thoracic;  Laterality: N/A;  . VIDEO BRONCHOSCOPY Bilateral 05/20/2015   Procedure: VIDEO BRONCHOSCOPY WITH FLUORO;  Surgeon: Collene Gobble, MD;  Location: [place];  Service: Cardiopulmonary;  Laterality: Bilateral;    Social History:  Social History   Social History  . Marital status: Married    Spouse name: N/A  . Number of children: N/A  . Years of education: N/A   Occupational History  . Not on file.   Social History Main Topics  . Smoking status: Former Smoker    Packs/day: 1.00    Types: Cigarettes    Quit date: 08/29/2012  . Smokeless tobacco: Never Used  . Alcohol use No  . Drug use: No  . Sexual activity: No   Other Topics Concern  . Not on file   Social History Narrative  . No narrative on file  The patient is married and is accompanied by his wife. He's originally  from [place].  Family History: Family History  Problem Relation Age of Onset  . Osteoporosis Mother   . COPD Father     ALLERGIES:  is allergic to bee venom.  Meds: Current Outpatient Prescriptions  Medication Sig Dispense Refill  . acetaminophen (TYLENOL) 500 MG tablet Take 1,000 mg by mouth every 8 (eight) hours as needed.    Marland Kitchen albuterol (PROVENTIL HFA;VENTOLIN HFA) 108 (90 Base) MCG/ACT inhaler Inhale 1-2 puffs into the lungs every 6 (six) hours as needed for wheezing or shortness of breath. 1 Inhaler 2  . chlorproMAZINE (THORAZINE) 25 MG tablet Take 1  tablet (25 mg total) by mouth 3 (three) times daily as needed. for  hiccoughs 30 tablet 0  . diphenhydramine-acetaminophen (TYLENOL PM) 25-500 MG TABS tablet Take 1 tablet by mouth at bedtime as needed.    Marland Kitchen EPINEPHrine 0.3 mg/0.3 mL IJ SOAJ injection Inject 0.3 mLs (0.3 mg total) into the muscle once. 1 Device 2  . lidocaine-prilocaine (EMLA) cream Apply to port -a-cath 1-2 hours prior to access. 30 g 2  . mirtazapine (REMERON) 30 MG tablet TK 1 T PO HS  2  . ondansetron (ZOFRAN) 8 MG tablet Take 1 tablet (8 mg total) by mouth every 8 (eight) hours as needed for nausea or vomiting. 30 tablet 1  . oxyCODONE (OXY IR/ROXICODONE) 5 MG immediate release tablet Take 1-2 tabs PO Q 4-6 hours PRN pain. 60 tablet 0  . oxymetazoline (AFRIN) 0.05 % nasal spray Place 1 spray into both nostrils 2 (two) times daily.    . polyethylene glycol (MIRALAX / GLYCOLAX) packet Take 17 g by mouth daily as needed for mild constipation.    . prochlorperazine (COMPAZINE) 10 MG tablet Take 1 tablet (10 mg total) by mouth every 6 (six) hours as needed for nausea or vomiting. DO  NOT  TAKE  WITH  THORAZINE. 30 tablet 0  . senna (SENOKOT) 8.6 MG TABS tablet Take 1 tablet (8.6 mg total) by mouth daily.  0   No current facility-administered medications for this encounter.    Facility-Administered Medications Ordered in Other Encounters  Medication Dose Route Frequency Provider Last Rate Last Dose  . 0.9 %  sodium chloride infusion   Intravenous Once Curt Bears, MD      . sodium chloride 0.9 % injection 10 mL  10 mL Intravenous PRN Curt Bears, MD   10 mL at 12/04/15 0850  . sodium chloride flush (NS) 0.9 % injection 10 mL  10 mL Intracatheter PRN Curt Bears, MD   10 mL at 10/17/15 1829    Physical Findings:  weight is 133 lb 9.6 oz (60.6 kg). His blood pressure is 150/82 (abnormal) and his pulse is 97. His respiration is 16 and oxygen saturation is 100%.     In general this is a well appearing [place] male in no  acute distress. He is alert and oriented x4 and appropriate throughout the examination. Cardiopulmonary assessment is negative for acute distress and he exhibits normal effort. He appears to be grossly intact from a neurologic perspective.   Lab Findings: Lab Results  Component Value Date   WBC 2.9 (L) 01/01/2016   WBC 5.5 10/27/2015   HGB 8.8 (L) 01/01/2016   HCT 26.4 (L) 01/01/2016   PLT 63 (L) 01/01/2016    Lab Results  Component Value Date   NA 137 01/01/2016   K 4.3 01/01/2016   CHLORIDE 107 01/01/2016   CO2 21 (L) 01/01/2016   GLUCOSE 113 01/01/2016  BUN 22.9 01/01/2016   CREATININE 1.4 (H) 01/01/2016   BILITOT 0.27 01/01/2016   ALKPHOS 258 (H) 01/01/2016   AST 44 (H) 01/01/2016   ALT 41 01/01/2016   PROT 6.7 01/01/2016   ALBUMIN 3.0 (L) 01/01/2016   CALCIUM 9.0 01/01/2016   ANIONGAP 10 01/01/2016   ANIONGAP 9 10/27/2015    Radiographic Findings: Mr Jeri Cos VT Contrast  Result Date: 12/31/2015 CLINICAL DATA:  Followup metastatic lung cancer. EXAM: MRI HEAD WITHOUT AND WITH CONTRAST TECHNIQUE: Multiplanar, multiecho pulse sequences of the brain and surrounding structures were obtained without and with intravenous contrast. CONTRAST:  15m MULTIHANCE GADOBENATE DIMEGLUMINE 529 MG/ML IV SOLN COMPARISON:  09/08/2015.  06/03/2015. FINDINGS: Brain: Diffusion imaging does not show any acute or subacute infarction or other cause of restricted diffusion. Abnormal T2 signal within the pons and the cerebral hemispheric deep white matter is progressive over that time period. This could be due to progressive small-vessel ischemic change, patchy radiation white matter change or paraneoplastic white matter change. No cortical or large vessel territory infarction. No evidence of metastatic disease to the brain parenchyma or leptomeninges. Vascular: Major vessels at base of the brain show flow. Skull and upper cervical spine: Marrow heterogeneity of the clivus and upper cervical spine  persists, consistent with treated metastatic disease. Cannot state with this is completely treated or there is residual tumor. No sign of extraosseous tumor or progression. No calvarial lesion is seen. Sinuses/Orbits: Small mastoid effusions right more left. Fluid previously seen in the right lateral sphenoid sinus has resolved. Other: None significant IMPRESSION: No evidence of brain or leptomeningeal metastatic disease. Heterogeneous marrow pattern of the clivus and upper cervical spine again demonstrated, most consistent with treated metastatic disease. No evidence of progressive disease. No calvarial lesion. Abnormal signal within the pons and cerebral hemispheric white matter that is progressive over time and could be due to progressive small-vessel disease, radiation change or paraneoplastic white matter change. Electronically Signed   By: MNelson ChimesM.D.   On: 12/31/2015 10:39    Impression/Plan:   1. Extensive stage lung cancer. The patient will continue to receive chemotherapy with Dr. MJulien Nordmann His MRI scan was reviewed and he will proceed with repeat MRI in 3 months time. He will keep uKoreainformed of any progressive symptoms or concerns he has prior to his next visit. I also discussed with the patient that there remains a risk of identifying disease which would require additional treatment. He states agreement and understanding.     ACarola Rhine PAC   This document serves as a record of services personally performed by AShona Simpson PAC. It was created on her behalf by LBethann Humble a trained medical scribe. The creation of this record is based on the scribe's personal observations and the provider's statements to them. This document has been checked and approved by the attending provider.

## 2016-01-05 NOTE — Addendum Note (Signed)
Encounter addended by: Heywood Footman, RN on: 01/05/2016  4:44 PM<BR>    Actions taken: Charge Capture section accepted

## 2016-01-14 ENCOUNTER — Ambulatory Visit (INDEPENDENT_AMBULATORY_CARE_PROVIDER_SITE_OTHER): Payer: Medicare Other | Admitting: *Deleted

## 2016-01-14 DIAGNOSIS — Z23 Encounter for immunization: Secondary | ICD-10-CM

## 2016-01-15 ENCOUNTER — Ambulatory Visit (HOSPITAL_BASED_OUTPATIENT_CLINIC_OR_DEPARTMENT_OTHER): Payer: Medicare Other

## 2016-01-15 ENCOUNTER — Other Ambulatory Visit (HOSPITAL_BASED_OUTPATIENT_CLINIC_OR_DEPARTMENT_OTHER): Payer: Medicare Other

## 2016-01-15 ENCOUNTER — Other Ambulatory Visit: Payer: Self-pay | Admitting: Internal Medicine

## 2016-01-15 ENCOUNTER — Ambulatory Visit: Payer: Medicare Other

## 2016-01-15 ENCOUNTER — Ambulatory Visit (HOSPITAL_BASED_OUTPATIENT_CLINIC_OR_DEPARTMENT_OTHER): Payer: Medicare Other | Admitting: Nurse Practitioner

## 2016-01-15 ENCOUNTER — Ambulatory Visit (HOSPITAL_COMMUNITY)
Admission: RE | Admit: 2016-01-15 | Discharge: 2016-01-15 | Disposition: A | Discharge status: Home / Self Care | Payer: Medicare Other | Source: Ambulatory Visit | Attending: Internal Medicine | Admitting: Internal Medicine

## 2016-01-15 VITALS — BP 115/66 | HR 88 | Temp 98.5°F | Resp 16 | Ht 69.0 in | Wt 134.1 lb

## 2016-01-15 DIAGNOSIS — C3491 Malignant neoplasm of unspecified part of right bronchus or lung: Secondary | ICD-10-CM

## 2016-01-15 DIAGNOSIS — C3431 Malignant neoplasm of lower lobe, right bronchus or lung: Secondary | ICD-10-CM

## 2016-01-15 DIAGNOSIS — D696 Thrombocytopenia, unspecified: Secondary | ICD-10-CM | POA: Diagnosis not present

## 2016-01-15 DIAGNOSIS — C342 Malignant neoplasm of middle lobe, bronchus or lung: Secondary | ICD-10-CM | POA: Diagnosis present

## 2016-01-15 DIAGNOSIS — R066 Hiccough: Secondary | ICD-10-CM

## 2016-01-15 DIAGNOSIS — Z95828 Presence of other vascular implants and grafts: Secondary | ICD-10-CM

## 2016-01-15 DIAGNOSIS — C3411 Malignant neoplasm of upper lobe, right bronchus or lung: Secondary | ICD-10-CM

## 2016-01-15 DIAGNOSIS — C3490 Malignant neoplasm of unspecified part of unspecified bronchus or lung: Secondary | ICD-10-CM

## 2016-01-15 DIAGNOSIS — D6481 Anemia due to antineoplastic chemotherapy: Secondary | ICD-10-CM | POA: Diagnosis not present

## 2016-01-15 DIAGNOSIS — Z5111 Encounter for antineoplastic chemotherapy: Secondary | ICD-10-CM

## 2016-01-15 DIAGNOSIS — R634 Abnormal weight loss: Secondary | ICD-10-CM | POA: Diagnosis not present

## 2016-01-15 LAB — COMPREHENSIVE METABOLIC PANEL WITH GFR
ALT: 38 U/L (ref 0–55)
AST: 60 U/L — ABNORMAL HIGH (ref 5–34)
Albumin: 2.9 g/dL — ABNORMAL LOW (ref 3.5–5.0)
Alkaline Phosphatase: 269 U/L — ABNORMAL HIGH (ref 40–150)
Anion Gap: 8 meq/L (ref 3–11)
BUN: 15.4 mg/dL (ref 7.0–26.0)
CO2: 21 meq/L — ABNORMAL LOW (ref 22–29)
Calcium: 9 mg/dL (ref 8.4–10.4)
Chloride: 106 meq/L (ref 98–109)
Creatinine: 1.5 mg/dL — ABNORMAL HIGH (ref 0.7–1.3)
EGFR: 47 ml/min/1.73 m2 — ABNORMAL LOW
Glucose: 118 mg/dL (ref 70–140)
Potassium: 4.4 meq/L (ref 3.5–5.1)
Sodium: 136 meq/L (ref 136–145)
Total Bilirubin: 0.32 mg/dL (ref 0.20–1.20)
Total Protein: 6.5 g/dL (ref 6.4–8.3)

## 2016-01-15 LAB — CBC WITH DIFFERENTIAL/PLATELET
BASO%: 0.3 % (ref 0.0–2.0)
BASOS ABS: 0 10*3/uL (ref 0.0–0.1)
EOS%: 0.8 % (ref 0.0–7.0)
Eosinophils Absolute: 0 10*3/uL (ref 0.0–0.5)
HEMATOCRIT: 23.8 % — AB (ref 38.4–49.9)
HGB: 7.9 g/dL — ABNORMAL LOW (ref 13.0–17.1)
LYMPH#: 0.6 10*3/uL — AB (ref 0.9–3.3)
LYMPH%: 17 % (ref 14.0–49.0)
MCH: 32.6 pg (ref 27.2–33.4)
MCHC: 33.2 g/dL (ref 32.0–36.0)
MCV: 98.3 fL — ABNORMAL HIGH (ref 79.3–98.0)
MONO#: 0.7 10*3/uL (ref 0.1–0.9)
MONO%: 18.3 % — ABNORMAL HIGH (ref 0.0–14.0)
NEUT#: 2.4 10*3/uL (ref 1.5–6.5)
NEUT%: 63.6 % (ref 39.0–75.0)
PLATELETS: 55 10*3/uL — AB (ref 140–400)
RBC: 2.42 10*6/uL — ABNORMAL LOW (ref 4.20–5.82)
RDW: 20.7 % — ABNORMAL HIGH (ref 11.0–14.6)
WBC: 3.7 10*3/uL — ABNORMAL LOW (ref 4.0–10.3)

## 2016-01-15 LAB — MAGNESIUM: Magnesium: 2.1 mg/dl (ref 1.5–2.5)

## 2016-01-15 MED ORDER — PALONOSETRON HCL INJECTION 0.25 MG/5ML
INTRAVENOUS | Status: AC
  Filled 2016-01-15: qty 5

## 2016-01-15 MED ORDER — CHLORPROMAZINE HCL 25 MG PO TABS: 25.0000 mg | ORAL_TABLET | Freq: Three times a day (TID) | ORAL | 0 refills | Status: AC | PRN

## 2016-01-15 MED ORDER — IRINOTECAN HCL CHEMO INJECTION 100 MG/5ML
50.0000 mg/m2 | Freq: Once | INTRAVENOUS | Status: AC
  Administered 2016-01-15: 80 mg via INTRAVENOUS
  Filled 2016-01-15: qty 4

## 2016-01-15 MED ORDER — SODIUM CHLORIDE 0.9 % IJ SOLN
10.0000 mL | INTRAMUSCULAR | Status: DC | PRN
  Administered 2016-01-15: 10 mL via INTRAVENOUS
  Filled 2016-01-15: qty 10

## 2016-01-15 MED ORDER — OXYCODONE HCL 5 MG PO TABS: ORAL_TABLET | ORAL | 0 refills | Status: DC

## 2016-01-15 MED ORDER — ATROPINE SULFATE 1 MG/ML IJ SOLN
0.5000 mg | Freq: Once | INTRAMUSCULAR | Status: AC | PRN
Start: 2016-01-15 — End: 2016-01-15
  Administered 2016-01-15: 0.5 mg via INTRAVENOUS

## 2016-01-15 MED ORDER — SODIUM CHLORIDE 0.9 % IV SOLN
25.0000 mg/m2 | Freq: Once | INTRAVENOUS | Status: AC
  Administered 2016-01-15: 45 mg via INTRAVENOUS
  Filled 2016-01-15: qty 45

## 2016-01-15 MED ORDER — HEPARIN SOD (PORK) LOCK FLUSH 100 UNIT/ML IV SOLN
500.0000 [IU] | Freq: Once | INTRAVENOUS | Status: AC | PRN
  Administered 2016-01-15: 500 [IU]
  Filled 2016-01-15: qty 5

## 2016-01-15 MED ORDER — SODIUM CHLORIDE 0.9% FLUSH
10.0000 mL | INTRAVENOUS | Status: DC | PRN
Start: 2016-01-15 — End: 2016-01-15
  Administered 2016-01-15: 10 mL
  Filled 2016-01-15: qty 10

## 2016-01-15 MED ORDER — PALONOSETRON HCL INJECTION 0.25 MG/5ML
0.2500 mg | Freq: Once | INTRAVENOUS | Status: AC
  Administered 2016-01-15: 0.25 mg via INTRAVENOUS

## 2016-01-15 MED ORDER — POTASSIUM CHLORIDE 2 MEQ/ML IV SOLN
Freq: Once | INTRAVENOUS | Status: AC
  Administered 2016-01-15: 11:00:00 via INTRAVENOUS
  Filled 2016-01-15: qty 10

## 2016-01-15 MED ORDER — SODIUM CHLORIDE 0.9 % IV SOLN
Freq: Once | INTRAVENOUS | Status: AC
  Administered 2016-01-15: 10:00:00 via INTRAVENOUS

## 2016-01-15 MED ORDER — ATROPINE SULFATE 1 MG/ML IJ SOLN
INTRAMUSCULAR | Status: AC
  Filled 2016-01-15: qty 1

## 2016-01-15 MED ORDER — SODIUM CHLORIDE 0.9 % IV SOLN
Freq: Once | INTRAVENOUS | Status: AC
  Administered 2016-01-15: 14:00:00 via INTRAVENOUS
  Filled 2016-01-15: qty 5

## 2016-01-15 NOTE — Progress Notes (Signed)
Dr. Julien Nordmann okay to tx pt today with Ctn 1.5, Plt 55, and Hgb 7.9. Performed type and cross for pt to receive blood tomorrow.

## 2016-01-15 NOTE — Patient Instructions (Signed)
Cancer Center Discharge Instructions for Patients Receiving Chemotherapy  Today you received the following chemotherapy agents: Irinotecan and Cisplatin   To help prevent nausea and vomiting after your treatment, we encourage you to take your nausea medication as directed.    If you develop nausea and vomiting that is not controlled by your nausea medication, call the clinic.   BELOW ARE SYMPTOMS THAT SHOULD BE REPORTED IMMEDIATELY:  *FEVER GREATER THAN 100.5 F  *CHILLS WITH OR WITHOUT FEVER  NAUSEA AND VOMITING THAT IS NOT CONTROLLED WITH YOUR NAUSEA MEDICATION  *UNUSUAL SHORTNESS OF BREATH  *UNUSUAL BRUISING OR BLEEDING  TENDERNESS IN MOUTH AND THROAT WITH OR WITHOUT PRESENCE OF ULCERS  *URINARY PROBLEMS  *BOWEL PROBLEMS  UNUSUAL RASH Items with * indicate a potential emergency and should be followed up as soon as possible.  Feel free to call the clinic you have any questions or concerns. The clinic phone number is [phone].  Please show the CHEMO ALERT CARD at check-in to the Emergency Department and triage nurse.   

## 2016-01-15 NOTE — Progress Notes (Signed)
OK to infuse Cisplatin with post hydration fluids per Dr. Julien Nordmann.

## 2016-01-15 NOTE — Progress Notes (Signed)
[place] OFFICE PROGRESS NOTE   DIAGNOSIS: Extensive stage (T3, N3, M1b) small cell lung cancer presented with large right middle and lower lobe mass in addition to extensive mediastinal lymphadenopathy, right pleural effusion as well as extensive liver metastasis and retroperitoneal lymphadenopathy diagnosed in April 2017.  PRIOR THERAPY:  1) Systemic chemotherapy with reduced dose carboplatin for AUC of 3 on day 1 and etoposide 60 MG/M2 on days 1, 2 and 3. First dose was given at [facility]. Status post 4 cycles. Starting from cycle #3 his carboplatin is for AUC of 5 and etoposide 100 MG/M2. Last dose of chemotherapy was given on 08/04/2015 discontinued secondary to disease progression. 2) prophylactic cranial irradiation as well as palliative radiotherapy to the chest under the care of Dr. Tammi Klippel completed 10/03/2015.  CURRENT THERAPY: Systemic chemotherapy with cisplatin 30 MG/M2 and irinotecan 65 MG/M2 on days 1 and 8 every 3 weeks. Status post 3 cycles. Starting from cycle #4 cisplatin 25 MG/M2 and irinotecan 50 MG/M2    INTERVAL HISTORY:   Mr. Szabo returns as scheduled. He completed cycle 4 cisplatin/irinotecan 12/25/2015, 01/01/2016. He denies significant nausea/vomiting. No mouth sores. No diarrhea. No change in baseline tinnitus which predated the chemotherapy. No numbness or tingling in his hands or feet. He denies any bleeding. No significant shortness of breath. He has an occasional slight cough. No fever. He feels cold at times. No shaking chills.  Objective:  Vital signs in last 24 hours:  Blood pressure 115/66, pulse 88, temperature 98.5 F (36.9 C), temperature source Oral, resp. rate 16, height '5\' 9"'$  (1.753 m), weight 134 lb 1.6 oz (60.8 kg), SpO2 97 %.    HEENT: No thrush or ulcers. Resp: Lungs clear bilaterally. Cardio: Regular rate and rhythm. GI: Fullness right upper abdomen. No discrete hepatomegaly. Vascular: No leg edema. Calves  soft and nontender. Skin: No rash. Port-A-Cath without erythema.    Lab Results:  Lab Results  Component Value Date   WBC 3.7 (L) 01/15/2016   HGB 7.9 (L) 01/15/2016   HCT 23.8 (L) 01/15/2016   MCV 98.3 (H) 01/15/2016   PLT 55 (L) 01/15/2016   NEUTROABS 2.4 01/15/2016    Imaging:  No results found.  Medications: I have reviewed the patient's current medications.  Assessment/Plan: 1. Extensive stage small cell lung cancer status post 4 cycles of chemotherapy with carboplatin and etoposide discontinued due to disease progression. He completed prophylactic cranial radiation as well as palliative radiation to the chest. He began treatment with cisplatin/irinotecan on a day one/day 8 schedule every 3 weeks on 10/17/2015. Cycle 4 completed with a dose reduction 12/25/2015, 01/01/2016. 2. Anemia, progressive. He requires periodic red cell transfusion support. 3. Weight loss. Weight has stabilized. 4. Thrombocytopenia. Question disease-related, chemotherapy contributing.   Disposition: Mr. Righi appears stable. He has completed 4 cycles of cisplatin/irinotecan. Chemotherapy dose reduced beginning with cycle 4 due to thrombocytopenia. I reviewed today's labs with Dr. Julien Nordmann. We discussed the anemia, thrombocytopenia and renal dysfunction. Plan to proceed with cycle 5 day 1 cisplatin/irinotecan today as scheduled. Dr. Julien Nordmann recommends restaging CT scans after this cycle. CT scans will be done without IV contrast due to renal dysfunction.  He has progressive anemia. We will arrange for red cell transfusion support 01/16/2016. He has mild progression of thrombocytopenia as well. He understands to contact the office with any bleeding.  He will return for the day 8 treatment in one week.  He will return for a follow-up visit on 02/05/2016. He  will contact the office in the interim as outlined above or with any other problems.  Plan reviewed with Dr. Julien Nordmann. 25 minutes were spent  face-to-face at today's visit with the majority of that time involved in counseling/coordination of care.      Ned Card ANP/GNP-BC   01/15/2016  9:42 AM

## 2016-01-16 ENCOUNTER — Ambulatory Visit (HOSPITAL_COMMUNITY)
Admission: RE | Admit: 2016-01-16 | Discharge: 2016-01-16 | Disposition: A | Discharge status: Home / Self Care | Payer: Medicare Other | Source: Ambulatory Visit | Attending: Internal Medicine | Admitting: Internal Medicine

## 2016-01-16 ENCOUNTER — Encounter: Payer: Self-pay | Admitting: *Deleted

## 2016-01-16 ENCOUNTER — Telehealth: Payer: Self-pay

## 2016-01-16 DIAGNOSIS — C3491 Malignant neoplasm of unspecified part of right bronchus or lung: Secondary | ICD-10-CM | POA: Diagnosis present

## 2016-01-16 LAB — PREPARE RBC (CROSSMATCH)

## 2016-01-16 MED ORDER — SODIUM CHLORIDE 0.9% FLUSH
10.0000 mL | INTRAVENOUS | Status: AC | PRN
  Administered 2016-01-16: 10 mL

## 2016-01-16 MED ORDER — HEPARIN SOD (PORK) LOCK FLUSH 100 UNIT/ML IV SOLN
500.0000 [IU] | Freq: Every day | INTRAVENOUS | Status: AC | PRN
  Administered 2016-01-16: 500 [IU]
  Filled 2016-01-16: qty 5

## 2016-01-16 MED ORDER — SODIUM CHLORIDE 0.9 % IV SOLN
250.0000 mL | Freq: Once | INTRAVENOUS | Status: AC
  Administered 2016-01-16: 250 mL via INTRAVENOUS

## 2016-01-16 NOTE — Progress Notes (Signed)
Patient ID: Shane Vasquez, male   DOB: 20-Dec-1950, 65 y.o.   MRN: [id number] Provider: Ned Card NP  Associated Diagnosis:Small cell carcinoma of lung, right [facility]) (C34.91  Procedure: Transfusion of 2 units of PRBC as ordered  Tolerated transfusion well. No reaction. Went over discharge instructions and copy given to patient. Alert, oriented and ambulatory at time of discharge. Discharged to home with wife.

## 2016-01-16 NOTE — Discharge Instructions (Signed)

## 2016-01-16 NOTE — Telephone Encounter (Signed)
[facility] pharmacy called stating they did not have 2.'5mg'$  in Marinol, it is unavailable from the manufacturer, they are requesting if MD wants '5mg'$  once daily instead. Will route message to Dr. Julien Nordmann and nurse for further instruction. Informed the pharmacist the MD was out today and would most likely know something on Monday the 4th.

## 2016-01-19 LAB — TYPE AND SCREEN
Blood Product Expiration Date: 201712112359
Blood Product Expiration Date: 201712112359
ISSUE DATE / TIME: 201712010901
ISSUE DATE / TIME: 201712010901
UNIT TYPE AND RH: 5100
UNIT TYPE AND RH: 5100

## 2016-01-22 ENCOUNTER — Other Ambulatory Visit (HOSPITAL_BASED_OUTPATIENT_CLINIC_OR_DEPARTMENT_OTHER): Payer: Medicare Other

## 2016-01-22 ENCOUNTER — Ambulatory Visit (HOSPITAL_BASED_OUTPATIENT_CLINIC_OR_DEPARTMENT_OTHER): Payer: Medicare Other

## 2016-01-22 VITALS — BP 154/82 | HR 92 | Temp 98.6°F | Resp 18

## 2016-01-22 DIAGNOSIS — C3491 Malignant neoplasm of unspecified part of right bronchus or lung: Secondary | ICD-10-CM

## 2016-01-22 DIAGNOSIS — Z95828 Presence of other vascular implants and grafts: Secondary | ICD-10-CM

## 2016-01-22 LAB — COMPREHENSIVE METABOLIC PANEL
ALBUMIN: 2.9 g/dL — AB (ref 3.5–5.0)
ALK PHOS: 280 U/L — AB (ref 40–150)
ALT: 35 U/L (ref 0–55)
ANION GAP: 11 meq/L (ref 3–11)
AST: 49 U/L — ABNORMAL HIGH (ref 5–34)
BUN: 26.3 mg/dL — AB (ref 7.0–26.0)
CALCIUM: 8.8 mg/dL (ref 8.4–10.4)
CO2: 21 mEq/L — ABNORMAL LOW (ref 22–29)
Chloride: 105 mEq/L (ref 98–109)
Creatinine: 1.4 mg/dL — ABNORMAL HIGH (ref 0.7–1.3)
EGFR: 53 mL/min/{1.73_m2} — AB (ref >90)
Glucose: 116 mg/dl (ref 70–140)
POTASSIUM: 4.2 meq/L (ref 3.5–5.1)
Sodium: 138 mEq/L (ref 136–145)
Total Bilirubin: 0.51 mg/dL (ref 0.20–1.20)
Total Protein: 6.6 g/dL (ref 6.4–8.3)

## 2016-01-22 LAB — CBC WITH DIFFERENTIAL/PLATELET
BASO%: 0.3 % (ref 0.0–2.0)
BASOS ABS: 0 10*3/uL (ref 0.0–0.1)
EOS%: 0.3 % (ref 0.0–7.0)
Eosinophils Absolute: 0 10*3/uL (ref 0.0–0.5)
HEMATOCRIT: 29.7 % — AB (ref 38.4–49.9)
HEMOGLOBIN: 10.1 g/dL — AB (ref 13.0–17.1)
LYMPH#: 0.6 10*3/uL — AB (ref 0.9–3.3)
LYMPH%: 21 % (ref 14.0–49.0)
MCH: 32.1 pg (ref 27.2–33.4)
MCHC: 34 g/dL (ref 32.0–36.0)
MCV: 94.3 fL (ref 79.3–98.0)
MONO#: 0.7 10*3/uL (ref 0.1–0.9)
MONO%: 22.7 % — ABNORMAL HIGH (ref 0.0–14.0)
NEUT#: 1.7 10*3/uL (ref 1.5–6.5)
NEUT%: 55.7 % (ref 39.0–75.0)
PLATELETS: 21 10*3/uL — AB (ref 140–400)
RBC: 3.15 10*6/uL — ABNORMAL LOW (ref 4.20–5.82)
RDW: 18.8 % — ABNORMAL HIGH (ref 11.0–14.6)
WBC: 3 10*3/uL — ABNORMAL LOW (ref 4.0–10.3)
nRBC: 0 % (ref 0–0)

## 2016-01-22 LAB — MAGNESIUM: Magnesium: 1.8 mg/dl (ref 1.5–2.5)

## 2016-01-22 MED ORDER — HEPARIN SOD (PORK) LOCK FLUSH 100 UNIT/ML IV SOLN
500.0000 [IU] | Freq: Once | INTRAVENOUS | Status: AC | PRN
Start: 2016-01-22 — End: 2016-01-22
  Administered 2016-01-22: 500 [IU] via INTRAVENOUS
  Filled 2016-01-22: qty 5

## 2016-01-22 MED ORDER — SODIUM CHLORIDE 0.9 % IJ SOLN
10.0000 mL | INTRAMUSCULAR | Status: DC | PRN
  Administered 2016-01-22: 10 mL via INTRAVENOUS
  Filled 2016-01-22: qty 10

## 2016-01-22 NOTE — Progress Notes (Signed)
Hold chemotherapy today platelets 21. Thrombocytopenia precautions given. Patient and spouse verbalized understanding to above mentioned plan.

## 2016-01-22 NOTE — Patient Instructions (Signed)
Thrombocytopenia Thrombocytopenia means that you have a low number of platelets in your blood. Platelets are tiny cells in the blood. When you bleed, they clump together at the cut or injury to stop the bleeding. This is called blood clotting. Not having enough platelets can cause bleeding problems. Follow these instructions at home: General instructions  Check your skin and inside your mouth for bruises or blood as told by your doctor.  Check to see if there is blood in your spit (sputum), pee (urine), and poop (stool). Do this as told by your doctor.  Ask your doctor if you can drink alcohol.  Take over-the-counter and prescription medicines only as told by your doctor.  Tell all of your doctors that you have this condition. Be sure to tell your dentist and eye doctor too. Activity  Do not do activities that can cause bumps or bruises until your doctor says it is okay.  Be careful not to cut yourself:  When you shave.  When you use scissors, needles, knives, or other tools.  Be careful not to burn yourself:  When you use an iron.  When you cook. Contact a doctor if:  You have bruises and you do not know why. Get help right away if:  You are bleeding anywhere on your body.  You have blood in your spit, pee, or poop. This information is not intended to replace advice given to you by your health care provider. Make sure you discuss any questions you have with your health care provider. Document Released: 01/21/2011 Document Revised: 10/05/2015 Document Reviewed: 08/05/2014 Elsevier Interactive Patient Education  2017 [employer].

## 2016-01-23 ENCOUNTER — Other Ambulatory Visit: Payer: Self-pay | Admitting: *Deleted

## 2016-01-23 DIAGNOSIS — C3490 Malignant neoplasm of unspecified part of unspecified bronchus or lung: Secondary | ICD-10-CM

## 2016-01-23 MED ORDER — DRONABINOL 2.5 MG PO CAPS: 2.5000 mg | ORAL_CAPSULE | Freq: Two times a day (BID) | ORAL | 0 refills | Status: AC

## 2016-01-29 ENCOUNTER — Ambulatory Visit (HOSPITAL_COMMUNITY)
Admission: RE | Admit: 2016-01-29 | Discharge: 2016-01-29 | Disposition: A | Discharge status: Home / Self Care | Payer: Medicare Other | Source: Ambulatory Visit | Attending: Internal Medicine | Admitting: Internal Medicine

## 2016-01-29 ENCOUNTER — Ambulatory Visit (HOSPITAL_BASED_OUTPATIENT_CLINIC_OR_DEPARTMENT_OTHER): Payer: Medicare Other

## 2016-01-29 ENCOUNTER — Other Ambulatory Visit: Payer: Self-pay | Admitting: Medical Oncology

## 2016-01-29 ENCOUNTER — Telehealth: Payer: Self-pay

## 2016-01-29 DIAGNOSIS — C3491 Malignant neoplasm of unspecified part of right bronchus or lung: Secondary | ICD-10-CM | POA: Diagnosis present

## 2016-01-29 DIAGNOSIS — D696 Thrombocytopenia, unspecified: Secondary | ICD-10-CM

## 2016-01-29 DIAGNOSIS — D649 Anemia, unspecified: Secondary | ICD-10-CM

## 2016-01-29 LAB — COMPREHENSIVE METABOLIC PANEL
ALT: 55 U/L (ref 0–55)
ANION GAP: 12 meq/L — AB (ref 3–11)
AST: 105 U/L — ABNORMAL HIGH (ref 5–34)
Albumin: 2.9 g/dL — ABNORMAL LOW (ref 3.5–5.0)
Alkaline Phosphatase: 367 U/L — ABNORMAL HIGH (ref 40–150)
BUN: 23.9 mg/dL (ref 7.0–26.0)
CHLORIDE: 108 meq/L (ref 98–109)
CO2: 19 meq/L — AB (ref 22–29)
Calcium: 8.9 mg/dL (ref 8.4–10.4)
Creatinine: 1.6 mg/dL — ABNORMAL HIGH (ref 0.7–1.3)
EGFR: 43 mL/min/{1.73_m2} — AB (ref >90)
Glucose: 184 mg/dl — ABNORMAL HIGH (ref 70–140)
Potassium: 4.5 mEq/L (ref 3.5–5.1)
Sodium: 138 mEq/L (ref 136–145)
Total Bilirubin: 0.78 mg/dL (ref 0.20–1.20)
Total Protein: 6.7 g/dL (ref 6.4–8.3)

## 2016-01-29 LAB — CBC WITH DIFFERENTIAL/PLATELET
BASO%: 0.3 % (ref 0.0–2.0)
BASOS ABS: 0 10*3/uL (ref 0.0–0.1)
EOS ABS: 0 10*3/uL (ref 0.0–0.5)
EOS%: 0.3 % (ref 0.0–7.0)
HCT: 27.8 % — ABNORMAL LOW (ref 38.4–49.9)
HGB: 9.3 g/dL — ABNORMAL LOW (ref 13.0–17.1)
LYMPH%: 10.8 % — AB (ref 14.0–49.0)
MCH: 32.4 pg (ref 27.2–33.4)
MCHC: 33.5 g/dL (ref 32.0–36.0)
MCV: 96.9 fL (ref 79.3–98.0)
MONO#: 0.8 10*3/uL (ref 0.1–0.9)
MONO%: 10.7 % (ref 0.0–14.0)
NEUT#: 5.5 10*3/uL (ref 1.5–6.5)
NEUT%: 77.9 % — ABNORMAL HIGH (ref 39.0–75.0)
PLATELETS: 8 10*3/uL — AB (ref 140–400)
RBC: 2.87 10*6/uL — AB (ref 4.20–5.82)
RDW: 20.1 % — ABNORMAL HIGH (ref 11.0–14.6)
WBC: 7 10*3/uL (ref 4.0–10.3)
lymph#: 0.8 10*3/uL — ABNORMAL LOW (ref 0.9–3.3)
nRBC: 0 % (ref 0–0)

## 2016-01-29 LAB — MAGNESIUM: Magnesium: 1.7 mg/dl (ref 1.5–2.5)

## 2016-01-29 MED ORDER — ACETAMINOPHEN 325 MG PO TABS
ORAL_TABLET | ORAL | Status: AC
  Filled 2016-01-29: qty 2

## 2016-01-29 MED ORDER — DIPHENHYDRAMINE HCL 25 MG PO CAPS
ORAL_CAPSULE | ORAL | Status: AC
  Filled 2016-01-29: qty 1

## 2016-01-29 MED ORDER — DIPHENHYDRAMINE HCL 25 MG PO CAPS
25.0000 mg | ORAL_CAPSULE | Freq: Once | ORAL | Status: AC
  Administered 2016-01-29: 25 mg via ORAL

## 2016-01-29 MED ORDER — HEPARIN SOD (PORK) LOCK FLUSH 100 UNIT/ML IV SOLN
250.0000 [IU] | INTRAVENOUS | Status: AC | PRN
  Administered 2016-01-29: 15:00:00
  Filled 2016-01-29: qty 5

## 2016-01-29 MED ORDER — ACETAMINOPHEN 325 MG PO TABS
650.0000 mg | ORAL_TABLET | Freq: Once | ORAL | Status: AC
  Administered 2016-01-29: 650 mg via ORAL

## 2016-01-29 MED ORDER — SODIUM CHLORIDE 0.9% FLUSH
3.0000 mL | INTRAVENOUS | Status: AC | PRN
  Administered 2016-01-29: 15:00:00
  Filled 2016-01-29: qty 10

## 2016-01-29 MED ORDER — SODIUM CHLORIDE 0.9 % IV SOLN
250.0000 mL | Freq: Once | INTRAVENOUS | Status: AC
  Administered 2016-01-29: 250 mL via INTRAVENOUS

## 2016-01-29 NOTE — Telephone Encounter (Signed)
Wife called b/c pt has nose bleed. "not gushing" and also developing some hoarseness. Called back, clumps up in left nostril, will bleed if pick it. Been going on about a week. Will bleed for about a minute.  Hoarseness without sore throat. No fever. Started yesterday, today a little worse. No nausea, no reflux, no SOB.  S/w Dr Julien Nordmann and he said for pt to come in for CBC then he will decide if he will see pt or [place]. Called pt back to let him know. He will be here in about 1 1/2 hour.

## 2016-01-29 NOTE — Patient Instructions (Signed)
Platelet Transfusion, Care After Introduction Refer to this sheet in the next few weeks. These instructions provide you with information about caring for yourself after your procedure. Your health care provider may also give you more specific instructions. Your treatment has been planned according to current medical practices, but problems sometimes occur. Call your health care provider if you have any problems or questions after your procedure. What can I expect after the procedure? After the procedure, it is common to have:  Bruising and soreness at the IV site.  Fever or chills within the first 48 hours of your transfusion. Follow these instructions at home:  Take medicines only as directed by your health care provider. Ask your health care provider if you can take an over-the-counter pain reliever in case you have a fever or headache a day or two after your transfusion.  Return to your normal activities as directed by your health care provider. Contact a health care provider if:  You have a fever.  You have a headache.  You have redness, swelling, or pain at your IV site.  You have skin itching or a rash.  You vomit.  You feel unusually tired or weak. Get help right away if:  You have trouble breathing.  You have a decreased amount of urine or you urinate less often than you normally do.  Your urine is darker than normal.  You have pain in your back, abdomen, or chest.  You have cool, clammy skin.  You have a rapid heartbeat. This information is not intended to replace advice given to you by your health care provider. Make sure you discuss any questions you have with your health care provider. Document Released: 02/22/2014 Document Revised: 07/10/2015 Document Reviewed: 12/12/2013  2017 Elsevier

## 2016-01-30 LAB — PREPARE PLATELET PHERESIS
BLOOD PRODUCT EXPIRATION DATE: 201712152359
ISSUE DATE / TIME: 201712141344
Unit Type and Rh: 5100

## 2016-02-02 ENCOUNTER — Ambulatory Visit (HOSPITAL_COMMUNITY)
Admission: RE | Admit: 2016-02-02 | Discharge: 2016-02-02 | Disposition: A | Discharge status: Home / Self Care | Payer: Medicare Other | Source: Ambulatory Visit | Attending: Nurse Practitioner | Admitting: Nurse Practitioner

## 2016-02-02 ENCOUNTER — Encounter (HOSPITAL_COMMUNITY): Payer: Self-pay

## 2016-02-02 ENCOUNTER — Telehealth: Payer: Self-pay | Admitting: Medical Oncology

## 2016-02-02 DIAGNOSIS — J90 Pleural effusion, not elsewhere classified: Secondary | ICD-10-CM | POA: Insufficient documentation

## 2016-02-02 DIAGNOSIS — C3491 Malignant neoplasm of unspecified part of right bronchus or lung: Secondary | ICD-10-CM | POA: Insufficient documentation

## 2016-02-02 DIAGNOSIS — J479 Bronchiectasis, uncomplicated: Secondary | ICD-10-CM | POA: Diagnosis not present

## 2016-02-02 DIAGNOSIS — C3490 Malignant neoplasm of unspecified part of unspecified bronchus or lung: Secondary | ICD-10-CM

## 2016-02-02 NOTE — Telephone Encounter (Signed)
Asking about marinol strength. PA needed for 2.5 mg and he has not heard about auth. Note sent to managed care.

## 2016-02-03 ENCOUNTER — Telehealth: Payer: Self-pay | Admitting: Medical Oncology

## 2016-02-03 ENCOUNTER — Encounter: Payer: Self-pay | Admitting: Internal Medicine

## 2016-02-03 ENCOUNTER — Telehealth: Payer: Self-pay

## 2016-02-03 NOTE — Telephone Encounter (Signed)
[place] with medicare called stating dronabinol was denied for non FDA approved use. The provider line to call for questions is [phone]. A letter will be sent.

## 2016-02-03 NOTE — Telephone Encounter (Signed)
Pt and wife were told dronabinol not approved for pt diagnosis by [employer].

## 2016-02-03 NOTE — Progress Notes (Signed)
[employer] denied coverage for Dronabinol 2.5 mg capsules because it's not being prescribed for a FDA labeled or medically accepted use.  Gave denial to the nurse.

## 2016-02-03 NOTE — Telephone Encounter (Signed)
Medrol dosepak

## 2016-02-04 ENCOUNTER — Telehealth: Payer: Self-pay | Admitting: Medical Oncology

## 2016-02-04 ENCOUNTER — Other Ambulatory Visit: Payer: Self-pay | Admitting: Medical Oncology

## 2016-02-04 MED ORDER — METHYLPREDNISOLONE 4 MG PO TBPK: ORAL_TABLET | ORAL | 0 refills | Status: AC

## 2016-02-04 NOTE — Telephone Encounter (Signed)
Medrol dose pak called to pharmacy and pt notified.

## 2016-02-05 ENCOUNTER — Other Ambulatory Visit: Payer: Self-pay | Admitting: Medical Oncology

## 2016-02-05 ENCOUNTER — Ambulatory Visit (HOSPITAL_BASED_OUTPATIENT_CLINIC_OR_DEPARTMENT_OTHER): Payer: Medicare Other | Admitting: Internal Medicine

## 2016-02-05 ENCOUNTER — Ambulatory Visit: Payer: Medicare Other

## 2016-02-05 ENCOUNTER — Ambulatory Visit (HOSPITAL_BASED_OUTPATIENT_CLINIC_OR_DEPARTMENT_OTHER): Payer: Medicare Other

## 2016-02-05 ENCOUNTER — Other Ambulatory Visit (HOSPITAL_BASED_OUTPATIENT_CLINIC_OR_DEPARTMENT_OTHER): Payer: Medicare Other

## 2016-02-05 ENCOUNTER — Encounter: Payer: Self-pay | Admitting: Internal Medicine

## 2016-02-05 DIAGNOSIS — C3491 Malignant neoplasm of unspecified part of right bronchus or lung: Secondary | ICD-10-CM

## 2016-02-05 DIAGNOSIS — R634 Abnormal weight loss: Secondary | ICD-10-CM

## 2016-02-05 DIAGNOSIS — D630 Anemia in neoplastic disease: Secondary | ICD-10-CM

## 2016-02-05 DIAGNOSIS — Z95828 Presence of other vascular implants and grafts: Secondary | ICD-10-CM

## 2016-02-05 DIAGNOSIS — D649 Anemia, unspecified: Secondary | ICD-10-CM

## 2016-02-05 DIAGNOSIS — E46 Unspecified protein-calorie malnutrition: Secondary | ICD-10-CM

## 2016-02-05 DIAGNOSIS — C786 Secondary malignant neoplasm of retroperitoneum and peritoneum: Secondary | ICD-10-CM

## 2016-02-05 DIAGNOSIS — G893 Neoplasm related pain (acute) (chronic): Secondary | ICD-10-CM | POA: Diagnosis not present

## 2016-02-05 DIAGNOSIS — C3431 Malignant neoplasm of lower lobe, right bronchus or lung: Secondary | ICD-10-CM

## 2016-02-05 DIAGNOSIS — F329 Major depressive disorder, single episode, unspecified: Secondary | ICD-10-CM | POA: Diagnosis not present

## 2016-02-05 DIAGNOSIS — C787 Secondary malignant neoplasm of liver and intrahepatic bile duct: Secondary | ICD-10-CM | POA: Diagnosis not present

## 2016-02-05 DIAGNOSIS — D6959 Other secondary thrombocytopenia: Secondary | ICD-10-CM | POA: Diagnosis not present

## 2016-02-05 DIAGNOSIS — C342 Malignant neoplasm of middle lobe, bronchus or lung: Secondary | ICD-10-CM

## 2016-02-05 DIAGNOSIS — D61810 Antineoplastic chemotherapy induced pancytopenia: Secondary | ICD-10-CM | POA: Diagnosis not present

## 2016-02-05 DIAGNOSIS — C3490 Malignant neoplasm of unspecified part of unspecified bronchus or lung: Secondary | ICD-10-CM

## 2016-02-05 LAB — CBC WITH DIFFERENTIAL/PLATELET
BASO%: 0.3 % (ref 0.0–2.0)
Basophils Absolute: 0 10*3/uL (ref 0.0–0.1)
EOS%: 0.7 % (ref 0.0–7.0)
Eosinophils Absolute: 0.1 10*3/uL (ref 0.0–0.5)
HCT: 22.3 % — ABNORMAL LOW (ref 38.4–49.9)
HEMOGLOBIN: 7.4 g/dL — AB (ref 13.0–17.1)
LYMPH#: 0.6 10*3/uL — AB (ref 0.9–3.3)
LYMPH%: 9.1 % — ABNORMAL LOW (ref 14.0–49.0)
MCH: 32.7 pg (ref 27.2–33.4)
MCHC: 33.2 g/dL (ref 32.0–36.0)
MCV: 98.7 fL — ABNORMAL HIGH (ref 79.3–98.0)
MONO#: 0.9 10*3/uL (ref 0.1–0.9)
MONO%: 12.7 % (ref 0.0–14.0)
NEUT%: 77.2 % — AB (ref 39.0–75.0)
NEUTROS ABS: 5.4 10*3/uL (ref 1.5–6.5)
Platelets: 11 10*3/uL — ABNORMAL LOW (ref 140–400)
RBC: 2.26 10*6/uL — AB (ref 4.20–5.82)
RDW: 22.8 % — AB (ref 11.0–14.6)
WBC: 6.9 10*3/uL (ref 4.0–10.3)
nRBC: 1 % — ABNORMAL HIGH (ref 0–0)

## 2016-02-05 LAB — COMPREHENSIVE METABOLIC PANEL
ALK PHOS: 493 U/L — AB (ref 40–150)
ALT: 46 U/L (ref 0–55)
ANION GAP: 12 meq/L — AB (ref 3–11)
AST: 129 U/L — ABNORMAL HIGH (ref 5–34)
Albumin: 2.7 g/dL — ABNORMAL LOW (ref 3.5–5.0)
BILIRUBIN TOTAL: 1.58 mg/dL — AB (ref 0.20–1.20)
BUN: 39.9 mg/dL — ABNORMAL HIGH (ref 7.0–26.0)
CALCIUM: 8.7 mg/dL (ref 8.4–10.4)
CO2: 22 meq/L (ref 22–29)
Chloride: 105 mEq/L (ref 98–109)
Creatinine: 2.1 mg/dL — ABNORMAL HIGH (ref 0.7–1.3)
EGFR: 32 mL/min/{1.73_m2} — AB (ref >90)
Glucose: 187 mg/dl — ABNORMAL HIGH (ref 70–140)
Potassium: 4.4 mEq/L (ref 3.5–5.1)
Sodium: 138 mEq/L (ref 136–145)
TOTAL PROTEIN: 6.4 g/dL (ref 6.4–8.3)

## 2016-02-05 LAB — TECHNOLOGIST REVIEW: Technologist Review: 1

## 2016-02-05 LAB — MAGNESIUM: MAGNESIUM: 1.8 mg/dL (ref 1.5–2.5)

## 2016-02-05 LAB — PREPARE RBC (CROSSMATCH)

## 2016-02-05 MED ORDER — ACETAMINOPHEN 325 MG PO TABS
650.0000 mg | ORAL_TABLET | Freq: Once | ORAL | Status: AC
  Administered 2016-02-05: 650 mg via ORAL

## 2016-02-05 MED ORDER — SODIUM CHLORIDE 0.9 % IJ SOLN
10.0000 mL | INTRAMUSCULAR | Status: DC | PRN
  Administered 2016-02-05: 10 mL via INTRAVENOUS
  Filled 2016-02-05: qty 10

## 2016-02-05 MED ORDER — DIPHENHYDRAMINE HCL 25 MG PO CAPS
ORAL_CAPSULE | ORAL | Status: AC
  Filled 2016-02-05: qty 1

## 2016-02-05 MED ORDER — OXYCODONE HCL 5 MG PO TABS: ORAL_TABLET | ORAL | 0 refills | Status: AC

## 2016-02-05 MED ORDER — DIPHENHYDRAMINE HCL 25 MG PO CAPS
25.0000 mg | ORAL_CAPSULE | Freq: Once | ORAL | Status: AC
  Administered 2016-02-05: 25 mg via ORAL

## 2016-02-05 MED ORDER — ACETAMINOPHEN 325 MG PO TABS
ORAL_TABLET | ORAL | Status: AC
  Filled 2016-02-05: qty 2

## 2016-02-05 MED ORDER — SODIUM CHLORIDE 0.9 % IV SOLN
250.0000 mL | Freq: Once | INTRAVENOUS | Status: AC
  Administered 2016-02-05: 250 mL via INTRAVENOUS

## 2016-02-05 MED ORDER — SODIUM CHLORIDE 0.9% FLUSH
10.0000 mL | INTRAVENOUS | Status: AC | PRN
  Administered 2016-02-05: 10 mL
  Filled 2016-02-05: qty 10

## 2016-02-05 MED ORDER — HEPARIN SOD (PORK) LOCK FLUSH 100 UNIT/ML IV SOLN
500.0000 [IU] | Freq: Every day | INTRAVENOUS | Status: AC | PRN
Start: 2016-02-05 — End: 2016-02-05
  Administered 2016-02-05: 500 [IU]
  Filled 2016-02-05: qty 5

## 2016-02-05 MED ORDER — SODIUM CHLORIDE 0.9 % IV SOLN: 250.0000 mL | Freq: Once | INTRAVENOUS | Status: DC

## 2016-02-05 NOTE — Patient Instructions (Signed)
Blood Transfusion, Adult, Care After This sheet gives you information about how to care for yourself after your procedure. Your health care provider may also give you more specific instructions. If you have problems or questions, contact your health care provider. What can I expect after the procedure? After your procedure, it is common to have:  Bruising and soreness where the IV tube was inserted.  Headache. Follow these instructions at home:  Take over-the-counter and prescription medicines only as told by your health care provider.  Return to your normal activities as told by your health care provider.  Follow instructions from your health care provider about how to take care of your IV insertion site. Make sure you:  Wash your hands with soap and water before you change your bandage (dressing). If soap and water are not available, use hand sanitizer.  Change your dressing as told by your health care provider.  Check your IV insertion site every day for signs of infection. Check for:  More redness, swelling, or pain.  More fluid or blood.  Warmth.  Pus or a bad smell. Contact a health care provider if:  You have more redness, swelling, or pain around the IV insertion site.  You have more fluid or blood coming from the IV insertion site.  Your IV insertion site feels warm to the touch.  You have pus or a bad smell coming from the IV insertion site.  Your urine turns pink, red, or brown.  You feel weak after doing your normal activities. Get help right away if:  You have signs of a serious allergic or immune system reaction, including:  Itchiness.  Hives.  Trouble breathing.  Anxiety.  Chest or lower back pain.  Fever, flushing, and chills.  Rapid pulse.  Rash.  Diarrhea.  Vomiting.  Dark urine.  Serious headache.  Dizziness.  Stiff neck.  Yellow coloration of the face or the white parts of the eyes (jaundice). This information is not  intended to replace advice given to you by your health care provider. Make sure you discuss any questions you have with your health care provider. Document Released: 02/22/2014 Document Revised: 10/01/2015 Document Reviewed: 08/18/2015 Elsevier Interactive Patient Education  2017 [place].   Platelet Transfusion, Care After Introduction Refer to this sheet in the next few weeks. These instructions provide you with information about caring for yourself after your procedure. Your health care provider may also give you more specific instructions. Your treatment has been planned according to current medical practices, but problems sometimes occur. Call your health care provider if you have any problems or questions after your procedure. What can I expect after the procedure? After the procedure, it is common to have:  Bruising and soreness at the IV site.  Fever or chills within the first 48 hours of your transfusion. Follow these instructions at home:  Take medicines only as directed by your health care provider. Ask your health care provider if you can take an over-the-counter pain reliever in case you have a fever or headache a day or two after your transfusion.  Return to your normal activities as directed by your health care provider. Contact a health care provider if:  You have a fever.  You have a headache.  You have redness, swelling, or pain at your IV site.  You have skin itching or a rash.  You vomit.  You feel unusually tired or weak. Get help right away if:  You have trouble breathing.  You have  a decreased amount of urine or you urinate less often than you normally do.  Your urine is darker than normal.  You have pain in your back, abdomen, or chest.  You have cool, clammy skin.  You have a rapid heartbeat. This information is not intended to replace advice given to you by your health care provider. Make sure you discuss any questions you have with your  health care provider. Document Released: 02/22/2014 Document Revised: 07/10/2015 Document Reviewed: 12/12/2013  2017 Elsevier

## 2016-02-05 NOTE — Progress Notes (Signed)
[place] [phone]   [phone]  OFFICE PROGRESS NOTE  Shane Cowden, MD [place] [address]  DIAGNOSIS: Extensive stage (T3, N3, M1 B) small cell lung cancer presented with large right middle and lower lobe mass, extensive mediastinal lymphadenopathy, right pleural effusion, extensive liver metastases and retroperitoneal lymphadenopathy diagnosed in April 2017.  PRIOR THERAPY: 1) Systemic chemotherapy with reduced dose carboplatin for AUC of 3 on day 1 and etoposide 60 MG/M2 on days 1, 2 and 3. First dose was given at [facility]. Status post 4 cycles. Starting from cycle #3 his carboplatin is for AUC of 5 and etoposide 100 MG/M2. Last dose of chemotherapy was given on 08/04/2015 discontinued secondary to disease progression. 2) prophylactic cranial irradiation as well as palliative radiotherapy to the chest under the care of Dr. Tammi Klippel completed 10/03/2015. 3) systemic chemotherapy with cisplatin 25 MG/M2 and irinotecan 50 MG/M2 on days 1 and 8 every 3 weeks status post 5 cycles. Discontinued today secondary to disease progression.  CURRENT THERAPY:None  INTERVAL HISTORY: Shane Vasquez 65 y.o. male returns to the clinic today for follow-up visit accompanied by his wife and brother. The patient continues to complain of increasing fatigue and weakness. He also has lack of appetite and weight loss. He denied having any current bleeding issues. He had some epistaxis week ago but this is much better now. He has no chest pain but continues to have shortness breath with exertion with no cough or hemoptysis. He has no nausea or vomiting. He denied having any significant fever or chills. He also continues to have pain and tenderness in the right upper quadrant of the abdomen. His platelets count has been low recently and he received platelet transfusion last week. He is here today for evaluation and repeat CT scan  of the chest, abdomen and pelvis for restaging of his disease.   MEDICAL HISTORY: Past Medical History:  Diagnosis Date  . CAP (community acquired pneumonia)    LLL 07/2014  . CERVICAL RADICULOPATHY, RIGHT 08/12/2009  . DIVERTICULOSIS, COLON 08/15/2006  . Empyema lung ([place])    2016 on left  . GERD 08/15/2006  . HYPERLIPIDEMIA 08/15/2006  . HYPERTENSION 08/15/2006  . INTERNAL HEMORRHOIDS 02/29/2008  . Lung cancer ([place])    metastatic small cell lung cancer  . SKIN CANCER, HX OF 08/15/2006    ALLERGIES:  is allergic to bee venom.  MEDICATIONS:  Current Outpatient Prescriptions  Medication Sig Dispense Refill  . acetaminophen (TYLENOL) 500 MG tablet Take 1,000 mg by mouth every 8 (eight) hours as needed.    Marland Kitchen albuterol (PROVENTIL HFA;VENTOLIN HFA) 108 (90 Base) MCG/ACT inhaler Inhale 1-2 puffs into the lungs every 6 (six) hours as needed for wheezing or shortness of breath. 1 Inhaler 2  . chlorproMAZINE (THORAZINE) 25 MG tablet Take 1 tablet (25 mg total) by mouth 3 (three) times daily as needed. for  hiccoughs 30 tablet 0  . diphenhydramine-acetaminophen (TYLENOL PM) 25-500 MG TABS tablet Take 1 tablet by mouth at bedtime as needed.    . dronabinol (MARINOL) 2.5 MG capsule Take 1 capsule (2.5 mg total) by mouth 2 (two) times daily before a meal. 60 capsule 0  . EPINEPHrine 0.3 mg/0.3 mL IJ SOAJ injection Inject 0.3 mLs (0.3 mg total) into the muscle once. 1 Device 2  . lidocaine-prilocaine (EMLA) cream Apply to port -a-cath 1-2 hours prior to access. 30 g 2  . methylPREDNISolone (MEDROL) 4 MG  TBPK tablet Take per package directions for appetite stimulation 21 tablet 0  . mirtazapine (REMERON) 30 MG tablet TK 1 T PO HS  2  . ondansetron (ZOFRAN) 8 MG tablet Take 1 tablet (8 mg total) by mouth every 8 (eight) hours as needed for nausea or vomiting. 30 tablet 1  . oxyCODONE (OXY IR/ROXICODONE) 5 MG immediate release tablet Take 1-2 tabs PO Q 4-6 hours PRN pain. 60 tablet 0  . oxymetazoline  (AFRIN) 0.05 % nasal spray Place 1 spray into both nostrils 2 (two) times daily.    . polyethylene glycol (MIRALAX / GLYCOLAX) packet Take 17 g by mouth daily as needed for mild constipation.    . prochlorperazine (COMPAZINE) 10 MG tablet Take 1 tablet (10 mg total) by mouth every 6 (six) hours as needed for nausea or vomiting. DO  NOT  TAKE  WITH  THORAZINE. 30 tablet 0  . senna (SENOKOT) 8.6 MG TABS tablet Take 1 tablet (8.6 mg total) by mouth daily.  0   No current facility-administered medications for this visit.    Facility-Administered Medications Ordered in Other Visits  Medication Dose Route Frequency Provider Last Rate Last Dose  . 0.9 %  sodium chloride infusion   Intravenous Once Curt Bears, MD      . sodium chloride 0.9 % injection 10 mL  10 mL Intravenous PRN Curt Bears, MD   10 mL at 12/04/15 0850  . sodium chloride flush (NS) 0.9 % injection 10 mL  10 mL Intracatheter PRN Curt Bears, MD   10 mL at 10/17/15 1829    SURGICAL HISTORY:  Past Surgical History:  Procedure Laterality Date  . TONSILLECTOMY    . VIDEO ASSISTED THORACOSCOPY (VATS)/EMPYEMA Left 07/27/2014   Procedure: VIDEO ASSISTED THORACOSCOPY (VATS)/EMPYEMA Left, drainage pleural effusion.;  Surgeon: [name], MD;  Location: [facility];  Service: Thoracic;  Laterality: Left;  Marland Kitchen VIDEO BRONCHOSCOPY N/A 07/27/2014   Procedure: VIDEO BRONCHOSCOPY;  Surgeon: [name], MD;  Location[facility] OR;  Service: Thoracic;  Laterality: N/A;  . VIDEO BRONCHOSCOPY Bilateral 05/20/2015   Procedure: VIDEO BRONCHOSCOPY WITH FLUORO;  Surgeon: Collene Gobble, MD;  Location: [facility];  Service: Cardiopulmonary;  Laterality: Bilateral;    REVIEW OF SYSTEMS:  Constitutional: positive for anorexia, fatigue and weight loss Eyes: negative Ears, nose, mouth, throat, and face: negative Respiratory: positive for dyspnea on exertion Cardiovascular: negative Gastrointestinal: positive for abdominal  pain Genitourinary:negative Integument/breast: negative Hematologic/lymphatic: negative Musculoskeletal:positive for back pain and muscle weakness Neurological: negative Behavioral/Psych: negative Endocrine: negative Allergic/Immunologic: negative   PHYSICAL EXAMINATION: General appearance: alert, cooperative, fatigued and no distress Head: Normocephalic, without obvious abnormality, atraumatic Neck: no adenopathy, no JVD, supple, symmetrical, trachea midline and thyroid not enlarged, symmetric, no tenderness/mass/nodules Lymph nodes: Cervical, supraclavicular, and axillary nodes normal. Resp: clear to auscultation bilaterally Back: symmetric, no curvature. ROM normal. No CVA tenderness. Cardio: regular rate and rhythm, S1, S2 normal, no murmur, click, rub or gallop GI: abnormal findings:  Tenderness to palpation at the right upper quadrant. Extremities: extremities normal, atraumatic, no cyanosis or edema Neurologic: Alert and oriented X 3, normal strength and tone. Normal symmetric reflexes. Normal coordination and gait  ECOG PERFORMANCE STATUS: 2 - Symptomatic, <50% confined to bed  Blood pressure 121/65, pulse (!) 103, temperature 98 F (36.7 C), temperature source Oral, resp. rate 18, height '5\' 9"'$  (1.753 m), weight 131 lb 9.6 oz (59.7 kg), SpO2 94 %.  LABORATORY DATA: Lab Results  Component Value Date  WBC 6.9 02/05/2016   HGB 7.4 (L) 02/05/2016   HCT 22.3 (L) 02/05/2016   MCV 98.7 (H) 02/05/2016   PLT 11 (L) 02/05/2016      Chemistry      Component Value Date/Time   NA 138 01/29/2016 1047   K 4.5 01/29/2016 1047   CL 103 10/27/2015 1216   CO2 19 (L) 01/29/2016 1047   BUN 23.9 01/29/2016 1047   CREATININE 1.6 (H) 01/29/2016 1047      Component Value Date/Time   CALCIUM 8.9 01/29/2016 1047   ALKPHOS 367 (H) 01/29/2016 1047   AST 105 (H) 01/29/2016 1047   ALT 55 01/29/2016 1047   BILITOT 0.78 01/29/2016 1047       RADIOGRAPHIC STUDIES: Ct Abdomen Pelvis Wo  Contrast  Result Date: 02/02/2016 CLINICAL DATA:  Subsequent treatment evaluation for lung cancer. Chemotherapy in progress. Radiation therapy complete. EXAM: CT CHEST, ABDOMEN AND PELVIS WITHOUT CONTRAST TECHNIQUE: Multidetector CT imaging of the chest, abdomen and pelvis was performed following the standard protocol without IV contrast. COMPARISON:  CT 12/01/2015 FINDINGS: CT CHEST FINDINGS Cardiovascular: Coronary artery calcification and aortic atherosclerotic calcification. Mediastinum/Nodes: Enlarge RIGHT supraclavicular lymphadenopathy. 10 mm node on image 6, series 2 increased from 8 mm. No mediastinal adenopathy. RIGHT hilum is difficult to assess. Lungs/Pleura: Small moderate RIGHT pleural effusion is increased mildly. No discrete pulmonary nodularity. Branching nodular pattern in the RIGHT lower lobe is not changed in mild bronchiectasis in the LEFT lower lobe medially. Scarring at the lung bases CT ABDOMEN PELVIS FINDINGS Hepatobiliary: Although exam is noncontrast lesion in the RIGHT hepatic lobe measures increased in size at 4.3 x 3.6 cm compared to 3.5 x 2.8 cm. The multiple lesions seen on comparison exam are not well demonstrated on this noncontrast exam. Liver has a nodular contour. Pancreas: No focal pancreatic lesion.  No duct dilatation Spleen: Normal Adrenals/Urinary Tract: Adrenal glands normal. Kidneys, ureters bladder normal. Contrast Stomach/Bowel: Stomach, small-bowel, cecum normal. No small bowel obstruction. Fecal rectosigmoid colon. Vascular/Lymphatic: Abdominal aorta normal caliber. Multiple periaortic lymph nodes s are light increase in size compared to prior. Example periaortic lymph node at the level of the celiac trunk the LEFT measures 8 mm short axis increased from 6 mm (image 63, series 2). Lymph node positioned between the IVC and aorta measures 9 mm (image 66, series 2) increased from 4 mm. No pelvic lymphadenopathy.  No inguinal adenopathy. Reproductive: Prostate normal  Other: No peritoneal metastasis. Enlargement retrocrural node measures 11 mm (image 51, series 2 increased from 8 mm. Musculoskeletal: No aggressive osseous lesion IMPRESSION: Chest Impression: 1. Increased RIGHT supraclavicular lymphadenopathy. 2. Small moderate RIGHT pleural effusion is mildly increased. 3. Bronchiectasis and inflammatory nodularity unchanged. Abdomen / Pelvis Impression: 1. Interval increase in size of dominant lesion the RIGHT hepatic lobe consistent with metastatic disease progression. 2. Nodular liver. 3. Increase in periaortic retroperitoneal adenopathy at the level of the renal veins and celiac trunk. 4. Retrocrural adenopathy increased. Electronically Signed   By: Suzy Bouchard M.D.   On: 02/02/2016 16:54   Ct Chest Wo Contrast  Result Date: 02/02/2016 CLINICAL DATA:  Subsequent treatment evaluation for lung cancer. Chemotherapy in progress. Radiation therapy complete. EXAM: CT CHEST, ABDOMEN AND PELVIS WITHOUT CONTRAST TECHNIQUE: Multidetector CT imaging of the chest, abdomen and pelvis was performed following the standard protocol without IV contrast. COMPARISON:  CT 12/01/2015 FINDINGS: CT CHEST FINDINGS Cardiovascular: Coronary artery calcification and aortic atherosclerotic calcification. Mediastinum/Nodes: Enlarge RIGHT supraclavicular lymphadenopathy. 10 mm node on image 6, series  2 increased from 8 mm. No mediastinal adenopathy. RIGHT hilum is difficult to assess. Lungs/Pleura: Small moderate RIGHT pleural effusion is increased mildly. No discrete pulmonary nodularity. Branching nodular pattern in the RIGHT lower lobe is not changed in mild bronchiectasis in the LEFT lower lobe medially. Scarring at the lung bases CT ABDOMEN PELVIS FINDINGS Hepatobiliary: Although exam is noncontrast lesion in the RIGHT hepatic lobe measures increased in size at 4.3 x 3.6 cm compared to 3.5 x 2.8 cm. The multiple lesions seen on comparison exam are not well demonstrated on this noncontrast  exam. Liver has a nodular contour. Pancreas: No focal pancreatic lesion.  No duct dilatation Spleen: Normal Adrenals/Urinary Tract: Adrenal glands normal. Kidneys, ureters bladder normal. Contrast Stomach/Bowel: Stomach, small-bowel, cecum normal. No small bowel obstruction. Fecal rectosigmoid colon. Vascular/Lymphatic: Abdominal aorta normal caliber. Multiple periaortic lymph nodes s are light increase in size compared to prior. Example periaortic lymph node at the level of the celiac trunk the LEFT measures 8 mm short axis increased from 6 mm (image 63, series 2). Lymph node positioned between the IVC and aorta measures 9 mm (image 66, series 2) increased from 4 mm. No pelvic lymphadenopathy.  No inguinal adenopathy. Reproductive: Prostate normal Other: No peritoneal metastasis. Enlargement retrocrural node measures 11 mm (image 51, series 2 increased from 8 mm. Musculoskeletal: No aggressive osseous lesion IMPRESSION: Chest Impression: 1. Increased RIGHT supraclavicular lymphadenopathy. 2. Small moderate RIGHT pleural effusion is mildly increased. 3. Bronchiectasis and inflammatory nodularity unchanged. Abdomen / Pelvis Impression: 1. Interval increase in size of dominant lesion the RIGHT hepatic lobe consistent with metastatic disease progression. 2. Nodular liver. 3. Increase in periaortic retroperitoneal adenopathy at the level of the renal veins and celiac trunk. 4. Retrocrural adenopathy increased. Electronically Signed   By: Suzy Bouchard M.D.   On: 02/02/2016 16:54    ASSESSMENT AND PLAN: This is a very pleasant 65 years old white male with: 1) extensive stage small cell lung cancer status post several chemotherapy regimens and he is currently undergoing treatment with reduced dose cisplatin and irinotecan status post 5 cycles. He had recent CT scan of the chest, abdomen and pelvis. I personally and independently reviewed the scan and discuss the results with the patient and his family.  Unfortunately the scan showed evidence for disease progression so she'll in the liver and retroperitoneal lymphadenopathy as well as supraclavicular lymph nodes. I discussed the scan results with the patient and his family. The patient has poor performance status and he is not a good candidate for continuation of systemic treatment at this point especially with his pancytopenia. I recommended for the patient to continue on observation for now. Palliative care and hospice would be a consideration if no improvement in his pancytopenia. 2) severe thrombocytopenia: Likely secondary to progression of his disease as well as previous systemic chemotherapy. We will arrange for the patient received 1 unit of platelet transfusion today. 3) anemia of neoplastic disease: I will arrange for the patient to receive 2 units of PRBCs transfusion today. 4) weight loss and lack of appetite: His insurance denied coverage for Marinol. The patient was started on Medrol Dosepak. He was also encouraged to increase his oral intake. 5) depression: He will continue on Remeron. 6) pain management: I gave the patient a refill of Percocet. He would come back for follow-up visit in one week for repeat blood work. I will see him back for follow-up visit in 2-3 weeks for evaluation and discussion of other treatment options.  The  patient voices understanding of current disease status and treatment options and is in agreement with the current care plan.  All questions were answered. The patient knows to call the clinic with any problems, questions or concerns. We can certainly see the patient much sooner if necessary.  Disclaimer: This note was dictated with voice recognition software. Similar sounding words can inadvertently be transcribed and may not be corrected upon review.

## 2016-02-06 LAB — TYPE AND SCREEN
ABO/RH(D): O POS
Antibody Screen: NEGATIVE
Unit division: 0
Unit division: 0

## 2016-02-06 LAB — PREPARE PLATELET PHERESIS: Unit division: 0

## 2016-02-11 ENCOUNTER — Telehealth: Payer: Self-pay | Admitting: Internal Medicine

## 2016-02-11 NOTE — Telephone Encounter (Signed)
Per Dr Julien Nordmann, hold off on scheduling a follow up appointment until after he review patient's labs on 02/10/2016. Previously scheduled chemo cancelled per 02/05/16 los. Spoke with patient's wife. Patient is aware. 02/11/16

## 2016-02-12 ENCOUNTER — Ambulatory Visit: Payer: Medicare Other

## 2016-02-12 ENCOUNTER — Other Ambulatory Visit: Payer: Self-pay | Admitting: *Deleted

## 2016-02-12 ENCOUNTER — Inpatient Hospital Stay (HOSPITAL_COMMUNITY): Payer: Medicare Other

## 2016-02-12 ENCOUNTER — Ambulatory Visit (HOSPITAL_COMMUNITY)
Admission: RE | Admit: 2016-02-12 | Discharge: 2016-02-12 | Disposition: A | Discharge status: Home / Self Care | Payer: Medicare Other | Source: Ambulatory Visit | Attending: Internal Medicine | Admitting: Internal Medicine

## 2016-02-12 ENCOUNTER — Encounter (HOSPITAL_COMMUNITY): Payer: Medicare Other

## 2016-02-12 ENCOUNTER — Inpatient Hospital Stay (HOSPITAL_COMMUNITY)
Admission: EM | Admit: 2016-02-12 | Discharge: 2016-02-16 | DRG: 682 | Disposition: E | Payer: Medicare Other | Attending: Family Medicine | Admitting: Family Medicine

## 2016-02-12 ENCOUNTER — Other Ambulatory Visit (HOSPITAL_BASED_OUTPATIENT_CLINIC_OR_DEPARTMENT_OTHER): Payer: Medicare Other

## 2016-02-12 ENCOUNTER — Ambulatory Visit (HOSPITAL_BASED_OUTPATIENT_CLINIC_OR_DEPARTMENT_OTHER): Payer: Medicare Other

## 2016-02-12 ENCOUNTER — Encounter (HOSPITAL_COMMUNITY): Payer: Self-pay | Admitting: Emergency Medicine

## 2016-02-12 DIAGNOSIS — Z825 Family history of asthma and other chronic lower respiratory diseases: Secondary | ICD-10-CM

## 2016-02-12 DIAGNOSIS — C801 Malignant (primary) neoplasm, unspecified: Secondary | ICD-10-CM

## 2016-02-12 DIAGNOSIS — D6481 Anemia due to antineoplastic chemotherapy: Secondary | ICD-10-CM | POA: Diagnosis present

## 2016-02-12 DIAGNOSIS — Z66 Do not resuscitate: Secondary | ICD-10-CM | POA: Diagnosis present

## 2016-02-12 DIAGNOSIS — C787 Secondary malignant neoplasm of liver and intrahepatic bile duct: Secondary | ICD-10-CM | POA: Diagnosis present

## 2016-02-12 DIAGNOSIS — C3431 Malignant neoplasm of lower lobe, right bronchus or lung: Secondary | ICD-10-CM | POA: Diagnosis not present

## 2016-02-12 DIAGNOSIS — C3491 Malignant neoplasm of unspecified part of right bronchus or lung: Secondary | ICD-10-CM

## 2016-02-12 DIAGNOSIS — E872 Acidosis: Secondary | ICD-10-CM | POA: Diagnosis present

## 2016-02-12 DIAGNOSIS — E8729 Other acidosis: Secondary | ICD-10-CM

## 2016-02-12 DIAGNOSIS — R531 Weakness: Secondary | ICD-10-CM | POA: Diagnosis present

## 2016-02-12 DIAGNOSIS — R627 Adult failure to thrive: Secondary | ICD-10-CM | POA: Diagnosis present

## 2016-02-12 DIAGNOSIS — D6959 Other secondary thrombocytopenia: Secondary | ICD-10-CM | POA: Diagnosis present

## 2016-02-12 DIAGNOSIS — Z85828 Personal history of other malignant neoplasm of skin: Secondary | ICD-10-CM | POA: Diagnosis not present

## 2016-02-12 DIAGNOSIS — T451X5A Adverse effect of antineoplastic and immunosuppressive drugs, initial encounter: Secondary | ICD-10-CM | POA: Diagnosis present

## 2016-02-12 DIAGNOSIS — C3490 Malignant neoplasm of unspecified part of unspecified bronchus or lung: Secondary | ICD-10-CM | POA: Diagnosis present

## 2016-02-12 DIAGNOSIS — C342 Malignant neoplasm of middle lobe, bronchus or lung: Secondary | ICD-10-CM | POA: Diagnosis present

## 2016-02-12 DIAGNOSIS — K219 Gastro-esophageal reflux disease without esophagitis: Secondary | ICD-10-CM | POA: Diagnosis present

## 2016-02-12 DIAGNOSIS — E7220 Disorder of urea cycle metabolism, unspecified: Secondary | ICD-10-CM | POA: Diagnosis present

## 2016-02-12 DIAGNOSIS — R7989 Other specified abnormal findings of blood chemistry: Secondary | ICD-10-CM | POA: Diagnosis present

## 2016-02-12 DIAGNOSIS — R945 Abnormal results of liver function studies: Secondary | ICD-10-CM

## 2016-02-12 DIAGNOSIS — N19 Unspecified kidney failure: Secondary | ICD-10-CM

## 2016-02-12 DIAGNOSIS — Z452 Encounter for adjustment and management of vascular access device: Secondary | ICD-10-CM | POA: Diagnosis present

## 2016-02-12 DIAGNOSIS — Z515 Encounter for palliative care: Secondary | ICD-10-CM | POA: Diagnosis present

## 2016-02-12 DIAGNOSIS — Z79899 Other long term (current) drug therapy: Secondary | ICD-10-CM

## 2016-02-12 DIAGNOSIS — E785 Hyperlipidemia, unspecified: Secondary | ICD-10-CM | POA: Diagnosis present

## 2016-02-12 DIAGNOSIS — G9341 Metabolic encephalopathy: Secondary | ICD-10-CM | POA: Diagnosis present

## 2016-02-12 DIAGNOSIS — R590 Localized enlarged lymph nodes: Secondary | ICD-10-CM | POA: Diagnosis present

## 2016-02-12 DIAGNOSIS — D649 Anemia, unspecified: Secondary | ICD-10-CM

## 2016-02-12 DIAGNOSIS — J9690 Respiratory failure, unspecified, unspecified whether with hypoxia or hypercapnia: Secondary | ICD-10-CM | POA: Diagnosis present

## 2016-02-12 DIAGNOSIS — Z87891 Personal history of nicotine dependence: Secondary | ICD-10-CM

## 2016-02-12 DIAGNOSIS — Z8701 Personal history of pneumonia (recurrent): Secondary | ICD-10-CM | POA: Diagnosis not present

## 2016-02-12 DIAGNOSIS — I10 Essential (primary) hypertension: Secondary | ICD-10-CM | POA: Diagnosis present

## 2016-02-12 DIAGNOSIS — E875 Hyperkalemia: Secondary | ICD-10-CM | POA: Diagnosis present

## 2016-02-12 DIAGNOSIS — N179 Acute kidney failure, unspecified: Principal | ICD-10-CM

## 2016-02-12 DIAGNOSIS — D691 Qualitative platelet defects: Secondary | ICD-10-CM

## 2016-02-12 DIAGNOSIS — Z95828 Presence of other vascular implants and grafts: Secondary | ICD-10-CM

## 2016-02-12 LAB — COMPREHENSIVE METABOLIC PANEL
ALBUMIN: 2.4 g/dL — AB (ref 3.5–5.0)
ALK PHOS: 465 U/L — AB (ref 40–150)
ALT: 38 U/L (ref 0–55)
ANION GAP: 22 meq/L — AB (ref 3–11)
AST: 137 U/L — ABNORMAL HIGH (ref 5–34)
BILIRUBIN TOTAL: 5.17 mg/dL — AB (ref 0.20–1.20)
BUN: 108.1 mg/dL — ABNORMAL HIGH (ref 7.0–26.0)
CO2: 12 mEq/L — ABNORMAL LOW (ref 22–29)
Calcium: 8.9 mg/dL (ref 8.4–10.4)
Chloride: 102 mEq/L (ref 98–109)
Creatinine: 3.2 mg/dL (ref 0.7–1.3)
EGFR: 19 mL/min/{1.73_m2} — AB (ref >90)
Glucose: 115 mg/dl (ref 70–140)
POTASSIUM: 6.1 meq/L — AB (ref 3.5–5.1)
Sodium: 135 mEq/L — ABNORMAL LOW (ref 136–145)
TOTAL PROTEIN: 5.9 g/dL — AB (ref 6.4–8.3)

## 2016-02-12 LAB — CBC WITH DIFFERENTIAL/PLATELET
BASO%: 0.1 % (ref 0.0–2.0)
BASOS ABS: 0 10*3/uL (ref 0.0–0.1)
EOS ABS: 0 10*3/uL (ref 0.0–0.5)
EOS%: 0.1 % (ref 0.0–7.0)
HCT: 20.7 % — ABNORMAL LOW (ref 38.4–49.9)
HGB: 6.8 g/dL — CL (ref 13.0–17.1)
LYMPH%: 4.1 % — ABNORMAL LOW (ref 14.0–49.0)
MCH: 31.2 pg (ref 27.2–33.4)
MCHC: 32.9 g/dL (ref 32.0–36.0)
MCV: 95 fL (ref 79.3–98.0)
MONO#: 1.8 10*3/uL — ABNORMAL HIGH (ref 0.1–0.9)
MONO%: 10.7 % (ref 0.0–14.0)
NEUT#: 14 10*3/uL — ABNORMAL HIGH (ref 1.5–6.5)
NEUT%: 85 % — AB (ref 39.0–75.0)
NRBC: 2 % — AB (ref 0–0)
PLATELETS: 16 10*3/uL — AB (ref 140–400)
RBC: 2.18 10*6/uL — AB (ref 4.20–5.82)
RDW: 24.3 % — ABNORMAL HIGH (ref 11.0–14.6)
WBC: 16.5 10*3/uL — AB (ref 4.0–10.3)
lymph#: 0.7 10*3/uL — ABNORMAL LOW (ref 0.9–3.3)

## 2016-02-12 LAB — BASIC METABOLIC PANEL
Anion gap: 17 — ABNORMAL HIGH (ref 5–15)
BUN: 115 mg/dL — AB (ref 6–20)
CALCIUM: 7.5 mg/dL — AB (ref 8.9–10.3)
CHLORIDE: 106 mmol/L (ref 101–111)
CO2: 12 mmol/L — AB (ref 22–32)
CREATININE: 2.97 mg/dL — AB (ref 0.61–1.24)
GFR calc non Af Amer: 20 mL/min — ABNORMAL LOW (ref >60)
GFR, EST AFRICAN AMERICAN: 24 mL/min — AB (ref >60)
Glucose, Bld: 102 mg/dL — ABNORMAL HIGH (ref 65–99)
Potassium: 6 mmol/L — ABNORMAL HIGH (ref 3.5–5.1)
SODIUM: 135 mmol/L (ref 135–145)

## 2016-02-12 LAB — AMMONIA: Ammonia: 57 umol/L — ABNORMAL HIGH (ref 9–35)

## 2016-02-12 LAB — MAGNESIUM: MAGNESIUM: 2.9 mg/dL — AB (ref 1.5–2.5)

## 2016-02-12 LAB — PREPARE RBC (CROSSMATCH)

## 2016-02-12 LAB — TECHNOLOGIST REVIEW

## 2016-02-12 MED ORDER — DIPHENHYDRAMINE HCL 25 MG PO CAPS
25.0000 mg | ORAL_CAPSULE | Freq: Once | ORAL | Status: AC
  Administered 2016-02-12: 25 mg via ORAL
  Filled 2016-02-12: qty 1

## 2016-02-12 MED ORDER — SODIUM CHLORIDE 0.9% FLUSH: 3.0000 mL | INTRAVENOUS | Status: DC | PRN

## 2016-02-12 MED ORDER — ACETAMINOPHEN 325 MG PO TABS
650.0000 mg | ORAL_TABLET | Freq: Once | ORAL | Status: AC
  Administered 2016-02-12: 650 mg via ORAL
  Filled 2016-02-12: qty 2

## 2016-02-12 MED ORDER — SODIUM CHLORIDE 0.9 % IV SOLN
INTRAVENOUS | Status: DC
  Administered 2016-02-12: 17:00:00 via INTRAVENOUS

## 2016-02-12 MED ORDER — SODIUM CHLORIDE 0.9 % IV SOLN: 250.0000 mL | Freq: Once | INTRAVENOUS | Status: DC

## 2016-02-12 MED ORDER — SODIUM CHLORIDE 0.9 % IV SOLN: 1000.0000 mL | INTRAVENOUS | Status: DC

## 2016-02-12 MED ORDER — SODIUM CHLORIDE 0.9 % IJ SOLN
10.0000 mL | INTRAMUSCULAR | Status: DC | PRN
  Administered 2016-02-12: 10 mL via INTRAVENOUS
  Filled 2016-02-12: qty 10

## 2016-02-12 MED ORDER — SODIUM CHLORIDE 0.9% FLUSH: 10.0000 mL | INTRAVENOUS | Status: DC | PRN

## 2016-02-12 MED ORDER — HEPARIN SOD (PORK) LOCK FLUSH 100 UNIT/ML IV SOLN: 500.0000 [IU] | Freq: Every day | INTRAVENOUS | Status: DC | PRN

## 2016-02-12 MED ORDER — ACETAMINOPHEN 650 MG RE SUPP
650.0000 mg | Freq: Four times a day (QID) | RECTAL | Status: DC | PRN
  Filled 2016-02-12: qty 1

## 2016-02-12 MED ORDER — OXYCODONE HCL 5 MG PO TABS: 10.0000 mg | ORAL_TABLET | ORAL | Status: DC | PRN

## 2016-02-12 MED ORDER — SODIUM CHLORIDE 0.9 % IV SOLN
1000.0000 mL | Freq: Once | INTRAVENOUS | Status: AC
  Administered 2016-02-12: 1000 mL via INTRAVENOUS

## 2016-02-12 MED ORDER — MORPHINE SULFATE (PF) 2 MG/ML IV SOLN
2.0000 mg | INTRAVENOUS | Status: DC | PRN
  Administered 2016-02-12 – 2016-02-13 (×3): 2 mg via INTRAVENOUS
  Filled 2016-02-12 (×3): qty 1

## 2016-02-12 MED ORDER — DIPHENHYDRAMINE HCL 50 MG/ML IJ SOLN
12.5000 mg | Freq: Once | INTRAMUSCULAR | Status: AC
  Administered 2016-02-12: 12.5 mg via INTRAVENOUS
  Filled 2016-02-12: qty 1

## 2016-02-12 MED ORDER — ACETAMINOPHEN 325 MG PO TABS
650.0000 mg | ORAL_TABLET | Freq: Four times a day (QID) | ORAL | Status: DC | PRN
  Filled 2016-02-12: qty 2

## 2016-02-12 MED ORDER — MIRTAZAPINE 30 MG PO TABS
30.0000 mg | ORAL_TABLET | Freq: Every day | ORAL | Status: DC
  Filled 2016-02-12: qty 1

## 2016-02-12 MED ORDER — HEPARIN SOD (PORK) LOCK FLUSH 100 UNIT/ML IV SOLN: 250.0000 [IU] | INTRAVENOUS | Status: DC | PRN

## 2016-02-12 MED ORDER — HEPARIN SOD (PORK) LOCK FLUSH 100 UNIT/ML IV SOLN
500.0000 [IU] | Freq: Once | INTRAVENOUS | Status: AC | PRN
  Administered 2016-02-12: 500 [IU] via INTRAVENOUS
  Filled 2016-02-12: qty 5

## 2016-02-12 NOTE — ED Provider Notes (Signed)
[facility] DEPT Provider Note   CSN: [id number] Arrival date & time: 02/14/2016  0938     History   Chief Complaint Chief Complaint  Patient presents with  . Fatigue  . Cancer    HPI Shane Vasquez is a 65 y.o. male.  He is here at the request of his oncologist, who reviewed his labs from this morning and felt he needed to be hospitalized, so sent him here. Patient is nearing end-of-life, with end-stage cancer, and has recently been withdrawn from chemotherapy due to lack of benefit, as well as side effects including pancytopenia. He is here with his brother, who gives the history. The patient has had decreased oral intake for several days, more more lethargy, and needed extreme help getting him to the office today. Patient lives with a family member.  Level V caveat- altered mental status  HPI  Past Medical History:  Diagnosis Date  . CAP (community acquired pneumonia)    LLL 07/2014  . CERVICAL RADICULOPATHY, RIGHT 08/12/2009  . DIVERTICULOSIS, COLON 08/15/2006  . Empyema lung ([place])    2016 on left  . GERD 08/15/2006  . HYPERLIPIDEMIA 08/15/2006  . HYPERTENSION 08/15/2006  . INTERNAL HEMORRHOIDS 02/29/2008  . Lung cancer ([place])    metastatic small cell lung cancer  . SKIN CANCER, HX OF 08/15/2006    Patient Active Problem List   Diagnosis Date Noted  . AKI (acute kidney injury) ([place]) 01/25/2016  . Small cell carcinoma of lung, right ([place]) 01/01/2016  . Depression 12/04/2015  . Chemotherapy induced thrombocytopenia 11/13/2015  . Unintentional weight loss 11/13/2015  . Encounter for antineoplastic chemotherapy 07/15/2015  . Port catheter in place 06/23/2015  . Protein-calorie malnutrition, severe 05/31/2015  . Hyperuricemia   . Metastatic carcinoma ([place])   . Small cell carcinoma of lung ([place]) 05/29/2015  . 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 Metastatic lung cancer (metastasis from lung to other site) [facility])   . Shortness of breath 05/18/2015    . Pleural effusion 05/18/2015  . Elevated LFTs 05/18/2015  . Enlarged lymph nodes   . Anemia, iron deficiency 08/01/2014  . CERVICAL RADICULOPATHY, RIGHT 08/12/2009  . INTERNAL HEMORRHOIDS 02/29/2008  . Dyslipidemia 08/15/2006  . Essential hypertension 08/15/2006  . GERD 08/15/2006  . Diverticulosis of large intestine 08/15/2006  . SKIN CANCER, HX OF 08/15/2006    Past Surgical History:  Procedure Laterality Date  . TONSILLECTOMY    . VIDEO ASSISTED THORACOSCOPY (VATS)/EMPYEMA Left 07/27/2014   Procedure: VIDEO ASSISTED THORACOSCOPY (VATS)/EMPYEMA Left, drainage pleural effusion.;  Surgeon: [name], MD;  Location: [facility];  Service: Thoracic;  Laterality: Left;  Marland Kitchen VIDEO BRONCHOSCOPY N/A 07/27/2014   Procedure: VIDEO BRONCHOSCOPY;  Surgeon: [name], MD;  Location: [facility] OR;  Service: Thoracic;  Laterality: N/A;  . VIDEO BRONCHOSCOPY Bilateral 05/20/2015   Procedure: VIDEO BRONCHOSCOPY WITH FLUORO;  Surgeon: Collene Gobble, MD;  Location: [place];  Service: Cardiopulmonary;  Laterality: Bilateral;       Home Medications    Prior to Admission medications   Medication Sig Start Date End Date Taking? Authorizing Provider  acetaminophen (TYLENOL) 500 MG tablet Take 1,000 mg by mouth every 8 (eight) hours as needed.    Historical Provider, MD  albuterol (PROVENTIL HFA;VENTOLIN HFA) 108 (90 Base) MCG/ACT inhaler Inhale 1-2 puffs into the lungs every 6 (six) hours as needed for wheezing or shortness of breath. 09/12/15   Susanne Borders, NP  chlorproMAZINE (THORAZINE) 25 MG tablet Take  1 tablet (25 mg total) by mouth 3 (three) times daily as needed. for  hiccoughs 01/15/16   Owens Shark, NP  diphenhydramine-acetaminophen (TYLENOL PM) 25-500 MG TABS tablet Take 1 tablet by mouth at bedtime as needed.    Historical Provider, MD  dronabinol (MARINOL) 2.5 MG capsule Take 1 capsule (2.5 mg total) by mouth 2 (two) times daily before a meal. Patient not taking: Reported on  02/05/2016 01/23/16   Curt Bears, MD  EPINEPHrine 0.3 mg/0.3 mL IJ SOAJ injection Inject 0.3 mLs (0.3 mg total) into the muscle once. Patient not taking: Reported on 02/05/2016 08/08/14   Marletta Lor, MD  lidocaine-prilocaine (EMLA) cream Apply to port -a-cath 1-2 hours prior to access. 12/25/15   Curt Bears, MD  methylPREDNISolone (MEDROL) 4 MG TBPK tablet Take per package directions for appetite stimulation 02/04/16   Curt Bears, MD  mirtazapine (REMERON) 30 MG tablet TK 1 T PO HS 12/04/15   Historical Provider, MD  ondansetron (ZOFRAN) 8 MG tablet Take 1 tablet (8 mg total) by mouth every 8 (eight) hours as needed for nausea or vomiting. 11/21/15   Curt Bears, MD  oxyCODONE (OXY IR/ROXICODONE) 5 MG immediate release tablet Take 1-2 tabs PO Q 4-6 hours PRN pain. 02/05/16   Curt Bears, MD  oxymetazoline (AFRIN) 0.05 % nasal spray Place 1 spray into both nostrils 2 (two) times daily.    Historical Provider, MD  polyethylene glycol (MIRALAX / GLYCOLAX) packet Take 17 g by mouth daily as needed for mild constipation.    Historical Provider, MD  prochlorperazine (COMPAZINE) 10 MG tablet Take 1 tablet (10 mg total) by mouth every 6 (six) hours as needed for nausea or vomiting. DO  NOT  TAKE  WITH  THORAZINE. 10/29/15   Curt Bears, MD  senna (SENOKOT) 8.6 MG TABS tablet Take 1 tablet (8.6 mg total) by mouth daily. 06/04/15   Domenic Polite, MD    Family History Family History  Problem Relation Age of Onset  . Osteoporosis Mother   . COPD Father     Social History Social History  Substance Use Topics  . Smoking status: Former Smoker    Packs/day: 1.00    Types: Cigarettes    Quit date: 08/29/2012  . Smokeless tobacco: Never Used  . Alcohol use No     Allergies   Bee venom   Review of Systems Review of Systems  Unable to perform ROS: Mental status change     Physical Exam Updated Vital Signs BP (!) 105/51 (BP Location: Left Arm)   Pulse 97   Temp  97.6 F (36.4 C) (Oral)   Resp 22   Ht '5\' 9"'$  (1.753 m)   Wt 131 lb (59.4 kg)   SpO2 91%   BMI 19.35 kg/m   Physical Exam  Constitutional: He appears well-developed.  Cachectic, appears older than stated age.  HENT:  Head: Normocephalic and atraumatic.  Right Ear: External ear normal.  Left Ear: External ear normal.  Dry oral mucous membranes.  Eyes: Conjunctivae and EOM are normal. Pupils are equal, round, and reactive to light.  Neck: Normal range of motion and phonation normal. Neck supple.  Cardiovascular: Regular rhythm and normal heart sounds.   Tachycardic  Pulmonary/Chest: Effort normal and breath sounds normal. No respiratory distress. He exhibits no bony tenderness.  Abdominal: Soft. Bowel sounds are normal. He exhibits mass (Right upper quadrant, likely liver enlargement.). There is no tenderness.  Musculoskeletal: Normal range of motion. He exhibits no edema  or deformity.  Neurological: He is alert. No cranial nerve deficit or sensory deficit. He exhibits normal muscle tone. Coordination normal.  Skin: Skin is warm, dry and intact.  Psychiatric: He has a normal mood and affect. His behavior is normal.  Nursing note and vitals reviewed.    ED Treatments / Results  Labs (all labs ordered are listed, but only abnormal results are displayed) Labs Reviewed  PREPARE RBC (CROSSMATCH)  TYPE AND SCREEN    EKG  EKG Interpretation None       Radiology No results found.  Procedures Procedures (including critical care time)  Medications Ordered in ED Medications  0.9 %  sodium chloride infusion (not administered)    Followed by  0.9 %  sodium chloride infusion (not administered)    Followed by  0.9 %  sodium chloride infusion (not administered)   Results for orders placed or performed during the hospital encounter of 02/07/2016  Prepare RBC  Result Value Ref Range   Order Confirmation ORDER PROCESSED BY BLOOD BANK   Type and screen [facility]  Result Value Ref Range   Blood Product Unit Number [id number]    Unit Type and Rh 5100    Blood Product Expiration Date 885027741287    Blood Product Unit Number [id number]    Unit Type and Rh 5100    Blood Product Expiration Date [id number]        Initial Impression / Assessment and Plan / ED Course  I have reviewed the triage vital signs and the nursing notes.  Pertinent labs & imaging results that were available during my care of the patient were reviewed by me and considered in my medical decision making (see chart for details).  Clinical Course     Medications  0.9 %  sodium chloride infusion (not administered)    Followed by  0.9 %  sodium chloride infusion (not administered)    Followed by  0.9 %  sodium chloride infusion (not administered)    Patient Vitals for the past 24 hrs:  BP Temp Temp src Pulse Resp SpO2 Height Weight  01/24/2016 1150 (!) 105/51 - - 97 22 91 % - -  01/30/2016 0946 - - - - - - '5\' 9"'$  (1.753 m) 131 lb (59.4 kg)  02/11/2016 0943 130/100 97.6 F (36.4 C) Oral 102 18 95 % - -   Palliative care consult initiated  11:53 AM Reevaluation with update and discussion. After initial assessment and treatment, an updated evaluation reveals No change in clinical status. Family and patient updated on findings. All other questions addressed and answered. Loza Prell L   11:55 AM-Consult complete with Hospitalist. Patient case explained and discussed. He agrees to admit patient for further evaluation and treatment. Call ended at 12:10   Final Clinical Impressions(s) / ED Diagnoses   Final diagnoses:  Anemia, unspecified type  AKI (acute kidney injury) ([place])  Hyperbilirubinemia  Hyperkalemia  Malignant neoplasm of lung, unspecified laterality, unspecified part of lung ([place])  Thrombocytopathia ([place])    Patient with acute kidney injury, and recurrent anemia, related to ongoing cancer, which has been deemed to be untreatable at this time.  Incidental hyperkalemia,  thrombocytopenia, and hyperbilirubinemia. His cancer is metastatic to liver. Palliative care consult initiated for end-of-life care. Patient's family members understand that he is terminal.  Nursing Notes Reviewed/ Care Coordinated, and agree without changes. Applicable Imaging Reviewed.  Interpretation of Laboratory Data incorporated into ED treatment  Plan: Admit for stabilization, and initiation of terminal  care.  New Prescriptions New Prescriptions   No medications on file     Daleen Bo, MD 01/23/2016 1211

## 2016-02-12 NOTE — H&P (Signed)
History and Physical  Shane Vasquez OQH:[id number] DOB: 09-Jan-1951 DOA: 02/05/2016  PCP: [name], MD  Patient coming from: Home  Chief Complaint: Weakness  HPI:  65 year old man PMH widely metastatic small cell lung cancer, no longer a candidate for chemotherapy, presents with failure to thrive, generalized weakness, poor oral intake. Initial evaluation revealed acute kidney injury with uremia, hyperkalemia, metabolic encephalopathy, anion gap metabolic acidosis; profound thrombocytopenia, stable but marked anemia. After further discussion with his wife, plans were made to admit for IV fluids and blood transfusion.  History obtained from brother at bedside. He confirms that chemotherapy was recently discontinued. Over the last week the patient has declined rapidly, poor oral intake, poor appetite. He's been confused over the last 24 hours. No specific complaints which is unusual as the patient usually "complains about everything". No specific complaints noted. Today, brother picked him up to take him to Dr. Worthy Flank office for blood work. This blood work was grossly abnormal and the patient was sent to the emergency department for further evaluation.  Brother spontaneously notes "this looks like the end, it is time for hospice?"  Wife confirms decline over the last week, did not respond well to blood transfusion.  ED Course: Afebrile, vital signs stable, no hypoxia. Treated with IV fluids. Pertinent labs: BUN 108, creatinine 3.2, potassium 6.1, alkaline phosphatase 465, AST 137, ALT 38, ammonia 57, total bilirubin 5.17. Platelets 16. Hemoglobin 6.8. WBC 16.5. Imaging: CT from December 18 noted.  Review of Systems:  Limited secondary to the patient's condition. Per brother bedside Negative for fever, new visual changes, sore throat, rash, new muscle aches, chest pain, SOB, dysuria, bleeding  Has chronic abdominal pain but not complaining of this today.  Past Medical  History:  Diagnosis Date  . CAP (community acquired pneumonia)    LLL 07/2014  . CERVICAL RADICULOPATHY, RIGHT 08/12/2009  . DIVERTICULOSIS, COLON 08/15/2006  . Empyema lung ([place])    2016 on left  . GERD 08/15/2006  . HYPERLIPIDEMIA 08/15/2006  . HYPERTENSION 08/15/2006  . INTERNAL HEMORRHOIDS 02/29/2008  . Lung cancer ([place])    metastatic small cell lung cancer  . SKIN CANCER, HX OF 08/15/2006    Past Surgical History:  Procedure Laterality Date  . TONSILLECTOMY    . VIDEO ASSISTED THORACOSCOPY (VATS)/EMPYEMA Left 07/27/2014   Procedure: VIDEO ASSISTED THORACOSCOPY (VATS)/EMPYEMA Left, drainage pleural effusion.;  Surgeon: [name], MD;  Location: [facility];  Service: Thoracic;  Laterality: Left;  Marland Kitchen VIDEO BRONCHOSCOPY N/A 07/27/2014   Procedure: VIDEO BRONCHOSCOPY;  Surgeon: [name], MD;  Location: [facility] OR;  Service: Thoracic;  Laterality: N/A;  . VIDEO BRONCHOSCOPY Bilateral 05/20/2015   Procedure: VIDEO BRONCHOSCOPY WITH FLUORO;  Surgeon: Collene Gobble, MD;  Location: [facility];  Service: Cardiopulmonary;  Laterality: Bilateral;     reports that he quit smoking about 3 years ago. His smoking use included Cigarettes. He smoked 1.00 pack per day. He has never used smokeless tobacco. He reports that he does not drink alcohol or use drugs.   Allergies  Allergen Reactions  . Bee Venom Anaphylaxis and Nausea And Vomiting    Family History  Problem Relation Age of Onset  . Osteoporosis Mother   . COPD Father      Prior to Admission medications   Medication Sig Start Date End Date Taking? Authorizing Provider  acetaminophen (TYLENOL) 500 MG tablet Take 1,000 mg by mouth every 8 (eight) hours as needed.    Historical Provider, MD  albuterol (PROVENTIL  HFA;VENTOLIN HFA) 108 (90 Base) MCG/ACT inhaler Inhale 1-2 puffs into the lungs every 6 (six) hours as needed for wheezing or shortness of breath. 09/12/15   Susanne Borders, NP  chlorproMAZINE (THORAZINE) 25 MG tablet Take 1  tablet (25 mg total) by mouth 3 (three) times daily as needed. for  hiccoughs 01/15/16   Owens Shark, NP  diphenhydramine-acetaminophen (TYLENOL PM) 25-500 MG TABS tablet Take 1 tablet by mouth at bedtime as needed.    Historical Provider, MD  dronabinol (MARINOL) 2.5 MG capsule Take 1 capsule (2.5 mg total) by mouth 2 (two) times daily before a meal. Patient not taking: Reported on 02/05/2016 01/23/16   Curt Bears, MD  EPINEPHrine 0.3 mg/0.3 mL IJ SOAJ injection Inject 0.3 mLs (0.3 mg total) into the muscle once. Patient not taking: Reported on 02/05/2016 08/08/14   Marletta Lor, MD  lidocaine-prilocaine (EMLA) cream Apply to port -a-cath 1-2 hours prior to access. 12/25/15   Curt Bears, MD  methylPREDNISolone (MEDROL) 4 MG TBPK tablet Take per package directions for appetite stimulation 02/04/16   Curt Bears, MD  mirtazapine (REMERON) 30 MG tablet TK 1 T PO HS 12/04/15   Historical Provider, MD  ondansetron (ZOFRAN) 8 MG tablet Take 1 tablet (8 mg total) by mouth every 8 (eight) hours as needed for nausea or vomiting. 11/21/15   Curt Bears, MD  oxyCODONE (OXY IR/ROXICODONE) 5 MG immediate release tablet Take 1-2 tabs PO Q 4-6 hours PRN pain. 02/05/16   Curt Bears, MD  oxymetazoline (AFRIN) 0.05 % nasal spray Place 1 spray into both nostrils 2 (two) times daily.    Historical Provider, MD  polyethylene glycol (MIRALAX / GLYCOLAX) packet Take 17 g by mouth daily as needed for mild constipation.    Historical Provider, MD  prochlorperazine (COMPAZINE) 10 MG tablet Take 1 tablet (10 mg total) by mouth every 6 (six) hours as needed for nausea or vomiting. DO  NOT  TAKE  WITH  THORAZINE. 10/29/15   Curt Bears, MD  senna (SENOKOT) 8.6 MG TABS tablet Take 1 tablet (8.6 mg total) by mouth daily. 06/04/15   Domenic Polite, MD    Physical Exam: Vitals:   01/21/2016 0943 01/23/2016 0946 02/05/2016 1150 02/05/2016 1336  BP: 130/100  (!) 105/51 (!) 107/46  Pulse: 102  97 95  Resp:  '18  22 15  '$ Temp: 97.6 F (36.4 C)     TempSrc: Oral     SpO2: 95%  91% 90%  Weight:  59.4 kg (131 lb)    Height:  '5\' 9"'$  (1.753 m)      Constitutional:  . Appears to be dying. Appears very weak, debilitated. Appears 69 years older than stated age. Eyes:  . Pupils and irises appear normal . Normal lids ENMT:  . external ears, nose appear normal . grossly normal hearing . Lips appear normal; there is dried blood on the gums. There are are at least 2 buccal ulcers noted. Neck:  . neck appears normal, no masses . no thyromegaly Respiratory:  . CTA bilaterally, no w/r/r.  . Tachypnea, mild increased respiratory effort, does not appear to be dipstick. No retractions or accessory muscle use Cardiovascular:  . RRR, no m/r/g . No LE extremity edema   Abdomen:  . Abdomen appears normal; mild generalized tenderness.  . No hernias noted Musculoskeletal:  . Digits/nails upper and lower extremities bilaterally: no clubbing, cyanosis, petechiae, infection . RUE, LUE, RLE, LLE   o strength and tone normal, no atrophy,  no abnormal movements o No tenderness, masses Skin:  . No rashes, lesions, ulcers . palpation of skin: no induration or nodules Psychiatric:  . judgement and insight appear impaired . Mental status o Mood, affect difficult to assess.  Wt Readings from Last 3 Encounters:  02/04/2016 59.4 kg (131 lb)  02/05/16 59.7 kg (131 lb 9.6 oz)  01/15/16 60.8 kg (134 lb 1.6 oz)    I have personally reviewed following labs and imaging studies  Labs on Admission:  CBC:  Recent Labs Lab 02/09/2016 0814  WBC 16.5*  NEUTROABS 14.0*  HGB 6.8*  HCT 20.7*  MCV 95.0  PLT 16*   Basic Metabolic Panel:  Recent Labs Lab 01/28/2016 0814  NA 135*  K 6.1*  CO2 12*  GLUCOSE 115  BUN 108.1*  CREATININE 3.2*  CALCIUM 8.9  MG 2.9*   Liver Function Tests:  Recent Labs Lab 01/16/2016 0814  AST 137*  ALT 38  ALKPHOS 465*  BILITOT 5.17*  PROT 5.9*  ALBUMIN 2.4*    Recent  Labs Lab 01/26/2016 1216  AMMONIA 57*   Urine analysis:    Component Value Date/Time   COLORURINE YELLOW 10/27/2015 1204   APPEARANCEUR CLOUDY (A) 10/27/2015 1204   LABSPEC 1.018 10/27/2015 1204   PHURINE 5.0 10/27/2015 1204   GLUCOSEU NEGATIVE 10/27/2015 1204   HGBUR MODERATE (A) 10/27/2015 1204   HGBUR negative 08/05/2009 0828   BILIRUBINUR NEGATIVE 10/27/2015 1204   BILIRUBINUR n 11/23/2011 1145   KETONESUR NEGATIVE 10/27/2015 1204   PROTEINUR NEGATIVE 10/27/2015 1204   UROBILINOGEN 0.2 07/26/2014 1953   NITRITE NEGATIVE 10/27/2015 1204   LEUKOCYTESUR NEGATIVE 10/27/2015 1204    Recent Results (from the past 240 hour(s))  TECHNOLOGIST REVIEW     Status: None   Collection Time: 02/05/16  8:10 AM  Result Value Ref Range Status   Technologist Review 1% metamyelocyte, 1% myelocyte  Final  TECHNOLOGIST REVIEW     Status: None   Collection Time: 02/04/2016  8:14 AM  Result Value Ref Range Status   Technologist Review Oc  meta  Final      Radiological Exams on Admission: No results found.    Principal Problem:   AKI (acute kidney injury) ([place]) Active Problems:   Elevated LFTs   Metastatic lung cancer (metastasis from lung to other site) ([facility])   Chemotherapy-induced thrombocytopenia   Hyperkalemia   Uremia   Acute metabolic encephalopathy   High anion gap metabolic acidosis   Assessment/Plan 1. Acute kidney injury with associated hyperkalemia, uremia, acute metabolic encephalopathy, anion gap metabolic acidosis  Likely multifactorial including failure to thrive, poor oral intake and end organ dysfunction related to cancer.  Aggressive IV fluids (hold for blood), strict I/O, check renal ultrasound.  Treat mild hyperkalemia with IVF 2. Small cell lung cancer, extensive mediastinal lymphadenopathy, extensive liver metastases, retroperitoneal lymphadenopathy. No longer a candidate for chemotherapy per oncology.   Supportive care per Dr.  Julien Nordmann 3. Chemotherapy-induced severe thrombocytopenia and anemia.  In regard to thrombocytopenia: No petechiae or active bleeding. Monitor clinically.  Transfuse 2 units PRBC. 4. Hyperammonemia. Secondary to metastatic disease of the liver.  The patient appears to be dying. Discussed in detail with wife, she understands she is critically ill, she wants to treat with IV fluids and blood transfusion. Discussed with Dr. Julien Nordmann who concurs with palliative Involvement and plan of care.  Code Status: DNR/DNI Family Communication: wife bedside Consults called: d/w Dr. Julien Nordmann    Time spent: 42 minutes  Murray Hodgkins, MD  [facility] Direct contact: [phone] --[place]ia [place]  --[email or web address]; password [email or web address]  7PM-7AM contact night coverage as above  02/05/2016, 1:49 PM

## 2016-02-12 NOTE — ED Triage Notes (Signed)
Patient was seen by doctor today and had blood work drawn. Patient was sent here today due to elevated WBC, low hemoglobin, and other abnormal labs. Patient family states he has had increased lethargy over the past week. Hx of lung cancer. Last chemo treatment was about 4 weeks ago.

## 2016-02-13 ENCOUNTER — Other Ambulatory Visit: Payer: Self-pay | Admitting: Nurse Practitioner

## 2016-02-13 ENCOUNTER — Other Ambulatory Visit: Payer: Self-pay

## 2016-02-13 DIAGNOSIS — C3490 Malignant neoplasm of unspecified part of unspecified bronchus or lung: Secondary | ICD-10-CM

## 2016-02-13 DIAGNOSIS — R7989 Other specified abnormal findings of blood chemistry: Secondary | ICD-10-CM

## 2016-02-13 DIAGNOSIS — E875 Hyperkalemia: Secondary | ICD-10-CM

## 2016-02-13 DIAGNOSIS — E872 Acidosis: Secondary | ICD-10-CM

## 2016-02-13 DIAGNOSIS — D6959 Other secondary thrombocytopenia: Secondary | ICD-10-CM

## 2016-02-13 DIAGNOSIS — T451X5A Adverse effect of antineoplastic and immunosuppressive drugs, initial encounter: Secondary | ICD-10-CM

## 2016-02-13 DIAGNOSIS — N179 Acute kidney failure, unspecified: Principal | ICD-10-CM

## 2016-02-13 DIAGNOSIS — N19 Unspecified kidney failure: Secondary | ICD-10-CM

## 2016-02-13 DIAGNOSIS — G9341 Metabolic encephalopathy: Secondary | ICD-10-CM

## 2016-02-13 LAB — CBC
HEMATOCRIT: 19.6 % — AB (ref 39.0–52.0)
HEMOGLOBIN: 6.7 g/dL — AB (ref 13.0–17.0)
MCH: 31 pg (ref 26.0–34.0)
MCHC: 34.2 g/dL (ref 30.0–36.0)
MCV: 90.7 fL (ref 78.0–100.0)
Platelets: 17 10*3/uL — CL (ref 150–400)
RBC: 2.16 MIL/uL — AB (ref 4.22–5.81)
RDW: 19.4 % — ABNORMAL HIGH (ref 11.5–15.5)
WBC: 19.5 10*3/uL — ABNORMAL HIGH (ref 4.0–10.5)

## 2016-02-13 LAB — PREPARE RBC (CROSSMATCH)

## 2016-02-13 LAB — BASIC METABOLIC PANEL
ANION GAP: 22 — AB (ref 5–15)
BUN: 134 mg/dL — ABNORMAL HIGH (ref 6–20)
CALCIUM: 8 mg/dL — AB (ref 8.9–10.3)
CO2: 9 mmol/L — ABNORMAL LOW (ref 22–32)
Chloride: 108 mmol/L (ref 101–111)
Creatinine, Ser: 2.86 mg/dL — ABNORMAL HIGH (ref 0.61–1.24)
GFR calc non Af Amer: 21 mL/min — ABNORMAL LOW (ref >60)
GFR, EST AFRICAN AMERICAN: 25 mL/min — AB (ref >60)
GLUCOSE: 147 mg/dL — AB (ref 65–99)
POTASSIUM: 6.4 mmol/L — AB (ref 3.5–5.1)
Sodium: 139 mmol/L (ref 135–145)

## 2016-02-13 LAB — AMMONIA: AMMONIA: 125 umol/L — AB (ref 9–35)

## 2016-02-13 MED ORDER — HYDROMORPHONE BOLUS VIA INFUSION
0.5000 mg | INTRAVENOUS | Status: DC | PRN
  Filled 2016-02-13: qty 1

## 2016-02-13 MED ORDER — HYDROMORPHONE HCL 2 MG/ML IJ SOLN: 0.5000 mg | INTRAMUSCULAR | Status: DC | PRN

## 2016-02-13 MED ORDER — MORPHINE SULFATE (CONCENTRATE) 10 MG/0.5ML PO SOLN: 5.0000 mg | ORAL | Status: DC | PRN

## 2016-02-13 MED ORDER — SODIUM CHLORIDE 0.9 % IV SOLN
0.5000 mg/h | INTRAVENOUS | Status: DC
  Administered 2016-02-13: 0.5 mg/h via INTRAVENOUS
  Filled 2016-02-13: qty 2.5

## 2016-02-13 MED ORDER — CALCIUM GLUCONATE 10 % IV SOLN
1.0000 g | Freq: Once | INTRAVENOUS | Status: DC
  Administered 2016-02-13: 1 g via INTRAVENOUS
  Filled 2016-02-13: qty 10

## 2016-02-13 MED ORDER — HALOPERIDOL 0.5 MG PO TABS
0.5000 mg | ORAL_TABLET | ORAL | Status: DC | PRN
  Filled 2016-02-13: qty 1

## 2016-02-13 MED ORDER — INSULIN ASPART 100 UNIT/ML IV SOLN
10.0000 [IU] | Freq: Once | INTRAVENOUS | Status: AC
  Administered 2016-02-13: 10 [IU] via INTRAVENOUS
  Filled 2016-02-13: qty 0.1

## 2016-02-13 MED ORDER — DEXTROSE 50 % IV SOLN: 25.0000 g | Freq: Once | INTRAVENOUS | Status: DC

## 2016-02-13 MED ORDER — SODIUM CHLORIDE 0.9 % IV SOLN: Freq: Once | INTRAVENOUS | Status: DC

## 2016-02-13 MED ORDER — HALOPERIDOL LACTATE 5 MG/ML IJ SOLN: 0.5000 mg | INTRAMUSCULAR | Status: DC | PRN

## 2016-02-13 MED ORDER — LORAZEPAM 1 MG PO TABS: 1.0000 mg | ORAL_TABLET | ORAL | Status: DC | PRN

## 2016-02-13 MED ORDER — LORAZEPAM 2 MG/ML PO CONC: 1.0000 mg | ORAL | Status: DC | PRN

## 2016-02-13 MED ORDER — LORAZEPAM 2 MG/ML IJ SOLN
1.0000 mg | INTRAMUSCULAR | Status: DC | PRN
  Administered 2016-02-13: 1 mg via INTRAVENOUS
  Filled 2016-02-13: qty 1

## 2016-02-13 MED ORDER — HALOPERIDOL LACTATE 2 MG/ML PO CONC
0.5000 mg | ORAL | Status: DC | PRN
  Filled 2016-02-13: qty 0.3

## 2016-02-16 LAB — TYPE AND SCREEN
BLOOD PRODUCT EXPIRATION DATE: 201801092359
BLOOD PRODUCT EXPIRATION DATE: 201801092359
Blood Product Expiration Date: 201801102359
ISSUE DATE / TIME: 201712281848
ISSUE DATE / TIME: 201712282132
UNIT TYPE AND RH: 5100
UNIT TYPE AND RH: 5100
Unit Type and Rh: 5100

## 2016-02-16 NOTE — Progress Notes (Signed)
SLP Cancellation Note  Patient Details Name: KABIR BRANNOCK MRN: [id number] DOB: 06-Feb-1951   Cancelled treatment:       Reason Eval/Treat Not Completed: Patient declined, no reason specified; spoke with nursing; initiating comfort care, nursing deferred BSE; please re-order prn.   Shriyan Arakawa,PAT, M.S., CCC-SLP 2016/02/28, 9:18 AM

## 2016-02-16 NOTE — Progress Notes (Signed)
Nutrition Brief Note  Patient identified to be seen for Malnutrition Screening Tool. Chart reviewed. Patient now transitioning to comfort care. Prognosis hours to days.   Patient is NPO. No nutrition interventions warranted at this time. Please consult Dietitian as needed.   Willey Blade, MS, RD, LDN Pager: [phone] After Hours Pager: [phone]

## 2016-02-16 NOTE — Consult Note (Signed)
Consultation Note Date: 02-22-2016   Patient Name: Shane Vasquez  DOB: 06/30/1950  MRN: [id number]  Age / Sex: 66 y.o., male  PCP: Marletta Lor, MD Referring Physician: Eber Jones, MD  Reason for Consultation: Establishing goals of care  HPI/Patient Profile: 66 y.o. male     admitted on 02/14/2016  66 year old man PMH widely metastatic small cell lung cancer, no longer a candidate for chemotherapy, presents with failure to thrive, generalized weakness, poor oral intake. Initial evaluation revealed acute kidney injury with uremia, hyperkalemia, metabolic encephalopathy, anion gap metabolic acidosis;profound thrombocytopenia, stable but marked anemia.    Clinical Assessment and Goals of Care:  Patient has rapidly declined over the last week. He has had poor oral intake, marked functional decline. He is now admitted with acute kidney injury, acute metabolic encephalopathy. He has small cell lung cancer, extensive mediastinal lymphadenopathy, extensive liver metastases, retroperitoneal lymphadenopathy. No longer a candidate for chemotherapy per oncology.   A palliative consult has been obtained for continuation of comfort measures and support for the family.   The patient is resting in bed, he is unresponsive, he is with moderate resp distress. He is using accessory muscles of respirations. I introduced myself and palliative care as follows: Palliative medicine is specialized medical care for people living with serious illness. It focuses on providing relief from the symptoms and stress of a serious illness. The goal is to improve quality of life for both the patient and the family.  The patient has non purposeful movements, he does not awaken, he does not follow commands. End of life signs and symptoms discussed in detail. We recommend Dilaudid drip, rotate from IV Morphine due to patient's  uremia and kidney injury. Do not recommend transfer to hospice facility. This has been discussed with wife and brother.   See recommendations below, thank you for the consult.   HCPOA  wife Brother   SUMMARY OF RECOMMENDATIONS    DNR DNI Comfort care Dilaudid IV and bolus infusions NPO Anticipated hospital death, prognosis hours to days discussed with family Comfort cart for family  Code Status/Advance Care Planning:  DNR    Symptom Management:     As above  Palliative Prophylaxis:   Delirium Protocol  Additional Recommendations (Limitations, Scope, Preferences):  Full Comfort Care  Psycho-social/Spiritual:   Desire for further Chaplaincy support:no  Additional Recommendations: Caregiving  Support/Resources  Prognosis:   Hours - Days  Discharge Planning: Anticipated Hospital Death      Primary Diagnoses: Present on Admission: . AKI (acute kidney injury) ([place]) . Chemotherapy-induced thrombocytopenia . Elevated LFTs . Metastatic lung cancer (metastasis from lung to other site) [facility])   I have reviewed the medical record, interviewed the patient and family, and examined the patient. The following aspects are pertinent.  Past Medical History:  Diagnosis Date  . CAP (community acquired pneumonia)    LLL 07/2014  . CERVICAL RADICULOPATHY, RIGHT 08/12/2009  . DIVERTICULOSIS, COLON 08/15/2006  . Empyema lung ([place])    2016 on left  . GERD  08/15/2006  . HYPERLIPIDEMIA 08/15/2006  . HYPERTENSION 08/15/2006  . INTERNAL HEMORRHOIDS 02/29/2008  . Lung cancer ([place])    metastatic small cell lung cancer  . SKIN CANCER, HX OF 08/15/2006   Social History   Social History  . Marital status: Married    Spouse name: N/A  . Number of children: N/A  . Years of education: N/A   Social History Main Topics  . Smoking status: Former Smoker    Packs/day: 1.00    Types: Cigarettes    Quit date: 08/29/2012  . Smokeless tobacco: Never Used  . Alcohol use No  . Drug use:  No  . Sexual activity: No   Other Topics Concern  . None   Social History Narrative  . None   Family History  Problem Relation Age of Onset  . Osteoporosis Mother   . COPD Father    Scheduled Meds: Continuous Infusions: . HYDROmorphone     PRN Meds:.haloperidol **OR** haloperidol **OR** haloperidol lactate, HYDROmorphone **AND** HYDROmorphone, LORazepam **OR** LORazepam **OR** LORazepam Medications Prior to Admission:  Prior to Admission medications   Medication Sig Start Date End Date Taking? Authorizing Provider  acetaminophen (TYLENOL) 500 MG tablet Take 1,000 mg by mouth every 8 (eight) hours as needed.   Yes Historical Provider, MD  calcium & magnesium carbonates (MYLANTA) 311-232 MG tablet Take 1 tablet by mouth daily as needed for heartburn.   Yes Historical Provider, MD  calcium carbonate (TUMS - DOSED IN MG ELEMENTAL CALCIUM) 500 MG chewable tablet Chew 1 tablet by mouth daily as needed for indigestion or heartburn.   Yes Historical Provider, MD  chlorproMAZINE (THORAZINE) 25 MG tablet Take 1 tablet (25 mg total) by mouth 3 (three) times daily as needed. for  hiccoughs 01/15/16  Yes Owens Shark, NP  EPINEPHrine 0.3 mg/0.3 mL IJ SOAJ injection Inject 0.3 mLs (0.3 mg total) into the muscle once. 08/08/14  Yes Marletta Lor, MD  lidocaine-prilocaine (EMLA) cream Apply to port -a-cath 1-2 hours prior to access. 12/25/15  Yes Curt Bears, MD  methylPREDNISolone (MEDROL) 4 MG TBPK tablet Take per package directions for appetite stimulation 02/04/16  Yes Curt Bears, MD  mirtazapine (REMERON) 30 MG tablet take 1 tablet by mouth daily at bedtime 12/04/15  Yes Historical Provider, MD  ondansetron (ZOFRAN) 8 MG tablet Take 1 tablet (8 mg total) by mouth every 8 (eight) hours as needed for nausea or vomiting. 11/21/15  Yes Curt Bears, MD  oxyCODONE (OXY IR/ROXICODONE) 5 MG immediate release tablet Take 1-2 tabs PO Q 4-6 hours PRN pain. 02/05/16  Yes Curt Bears, MD    polyethylene glycol [facility] / GLYCOLAX) packet Take 17 g by mouth daily as needed for mild constipation.   Yes Historical Provider, MD  prochlorperazine (COMPAZINE) 10 MG tablet Take 1 tablet (10 mg total) by mouth every 6 (six) hours as needed for nausea or vomiting. DO  NOT  TAKE  WITH  THORAZINE. 10/29/15  Yes Curt Bears, MD  senna (SENOKOT) 8.6 MG TABS tablet Take 1 tablet (8.6 mg total) by mouth daily. 06/04/15  Yes Domenic Polite, MD  albuterol (PROVENTIL HFA;VENTOLIN HFA) 108 (90 Base) MCG/ACT inhaler Inhale 1-2 puffs into the lungs every 6 (six) hours as needed for wheezing or shortness of breath. Patient not taking: Reported on 02/07/2016 09/12/15   Susanne Borders, NP  diphenhydramine-acetaminophen (TYLENOL PM) 25-500 MG TABS tablet Take 1 tablet by mouth at bedtime as needed.    Historical Provider, MD  dronabinol (MARINOL)  2.5 MG capsule Take 1 capsule (2.5 mg total) by mouth 2 (two) times daily before a meal. Patient not taking: Reported on 01/31/2016 01/23/16   Curt Bears, MD   Allergies  Allergen Reactions  . Bee Venom Anaphylaxis and Nausea And Vomiting   Review of Systems Unresponsive   Physical Exam Moderate resp distress Using accessory muscles of respirations S1 S2 Abdomen soft Cachectic Unresponsive  Vital Signs: BP (!) 111/41 (BP Location: Left Arm)   Pulse (!) 102   Temp 98 F (36.7 C) (Axillary)   Resp 16   Ht '5\' 9"'$  (1.753 m)   Wt 59.9 kg (132 lb)   SpO2 98%   BMI 19.49 kg/m  Pain Assessment: 0-10   Pain Score: Asleep   SpO2: SpO2: 98 % O2 Device:SpO2: 98 % O2 Flow Rate: .O2 Flow Rate (L/min): 2 L/min  IO: Intake/output summary:  Intake/Output Summary (Last 24 hours) at 02/25/2016 0932 Last data filed at February 25, 2016 0100  Gross per 24 hour  Intake          1217.08 ml  Output              150 ml  Net          1067.08 ml    LBM: Last BM Date: 01/29/2016 Baseline Weight: Weight: 59.4 kg (131 lb) Most recent weight: Weight: 59.9 kg (132 lb)      Palliative Assessment/Data:   Flowsheet Rows   Flowsheet Row Most Recent Value  Intake Tab  Referral Department  Hospitalist  Unit at Time of Referral  Oncology Unit  Palliative Care Primary Diagnosis  Cancer  Palliative Care Type  New Palliative care  Reason for referral  End of Life Care Assistance  Date first seen by Palliative Care  2016-02-25  Clinical Assessment  Palliative Performance Scale Score  10%  Pain Max last 24 hours  6  Pain Min Last 24 hours  3  Dyspnea Max Last 24 Hours  4  Dyspnea Min Last 24 hours  3  Nausea Max Last 24 Hours  2  Nausea Min Last 24 Hours  4  Anxiety Max Last 24 Hours  2  Anxiety Min Last 24 Hours  3  Psychosocial & Spiritual Assessment  Palliative Care Outcomes  Patient/Family meeting held?  Yes  Who was at the meeting?  patient's wife and brother   Palliative Care Outcomes  Improved pain interventions      Time In:  8.30 Time Out:  9.30 Time Total:  60 min  Greater than 50%  of this time was spent counseling and coordinating care related to the above assessment and plan.  Signed by: Loistine Chance, MD  [phone]  Please contact Palliative Medicine Team phone at [phone] for questions and concerns.  For individual provider: See Shea Evans

## 2016-02-16 NOTE — Progress Notes (Signed)
Called to patients room for rapid response call. Patient found on NRB with spo2 100%. HR was in the 170's nursing obtained EKG. Patients HR was down in the low 100's, rapid response nurse said nothing else needed from respiratory, instructed nurse to call if needed.

## 2016-02-16 NOTE — Progress Notes (Signed)
Wife at bedside and called nurse to report patient has stopped breathing. Patient found to have no BP, no respirations, no pulse and no O2 saturation readings. Time of death is 1325. Dr. Adair Patter on unit and made aware.

## 2016-02-16 NOTE — Progress Notes (Signed)
PROGRESS NOTE    Shane Vasquez  JEH:[id number] DOB: 01/27/1951 DOA: 02/04/2016 PCP: [name], MD   Brief Narrative:  66 year old man PMH widely metastatic small cell lung cancer, no longer a candidate for chemotherapy, presents with failure to thrive, generalized weakness, poor oral intake. Initial evaluation revealed acute kidney injury with uremia, hyperkalemia, metabolic encephalopathy, anion gap metabolic acidosis; profound thrombocytopenia, stable but marked anemia. After further discussion with his wife, plans were made to admit for IV fluids and blood transfusion.  History obtained from brother at bedside. He confirms that chemotherapy was recently discontinued. Over the last week the patient has declined rapidly, poor oral intake, poor appetite. He's been confused over the last 24 hours. No specific complaints which is unusual as the patient usually "complains about everything". No specific complaints noted. Today, brother picked him up to take him to Dr. Worthy Flank office for blood work. This blood work was grossly abnormal and the patient was sent to the emergency department for further evaluation.  Brother spontaneously notes "this looks like the end, it is time for hospice?"  Wife confirms decline over the last week, did not respond well to blood transfusion.  ED Course: Afebrile, vital signs stable, no hypoxia. Treated with IV fluids. Pertinent labs: BUN 108, creatinine 3.2, potassium 6.1, alkaline phosphatase 465, AST 137, ALT 38, ammonia 57, total bilirubin 5.17. Platelets 16. Hemoglobin 6.8. WBC 16.5. Imaging: CT from December 18 noted.   Assessment & Plan:   Principal Problem:   AKI (acute kidney injury) ([place]) Active Problems:   Elevated LFTs   Metastatic lung cancer (metastasis from lung to other site) ([place])   Chemotherapy-induced thrombocytopenia   Hyperkalemia   Uremia   Acute metabolic encephalopathy   High anion gap metabolic acidosis  Acute  kidney injury with associated hyperkalemia, uremia, acute metabolic encephalopathy, anion gap metabolic acidosis - Likely multifactorial including failure to thrive, poor oral intake and end organ dysfunction related to cancer. - comfort based measures only - will d/c IVF and further lab draws   Small cell lung cancer, extensive mediastinal lymphadenopathy, extensive liver metastases, retroperitoneal lymphadenopathy. No longer a candidate for chemotherapy per oncology.  - comfort measures only - palliative care consulted  Chemotherapy-induced severe thrombocytopenia and anemia. - H/H stable from yesterday - will d/c further lab draws and stop PRBC transfusions - comfort based measures only  Hyperammonemia - Secondary to metastatic disease of the liver.  Decision made this morning with patietn's wife and brother that patient be transitioned to comfort based measures only.  Palliative care consulted as is social work to assist with possibly getting patient to [facility] for hospice services  Code Status: DNR/DNI Family Communication: wife and brother bedside Consults called: d/w Dr. Rowe Pavy of palliative care  Disposition: hopefully can discharge to hospice today, otherwise anticipate hospital death     Consultants:   Palliative Care  Procedures:   None  Antimicrobials:   None    Subjective: Called by nursing staff 2/2 family wanting to discuss transition to comfort based measures.  Family would like patient to be either be put on a morphine drip or given medicine to decrease pain.  Patient not awake, not alert.  Objective: Vitals:   01/23/2016 2212 2016/02/15 0048 02/15/16 0441 February 15, 2016 0442  BP: 115/70 (!) 137/48 (!) 111/41   Pulse: 87 (!) 104 (!) 102   Resp:  16 16   Temp: 97.8 F (36.6 C) 97.8 F (36.6 C) 98 F (36.7 C)   TempSrc: Oral  Axillary Axillary   SpO2: 94% 100% 99% 98%  Weight:      Height:        Intake/Output Summary (Last 24 hours) at February 27, 2016  0750 Last data filed at 02/27/16 0100  Gross per 24 hour  Intake          1217.08 ml  Output              150 ml  Net          1067.08 ml   Filed Weights   01/30/2016 0946 01/23/2016 1654  Weight: 59.4 kg (131 lb) 59.9 kg (132 lb)    Examination:  General exam: Appears in some degree of distress- involuntary movements noted  Respiratory system: Clear to auscultation. Respiratory effort normal. Cardiovascular system: S1 & S2 heard, RRR. No JVD, murmurs, rubs, gallops or clicks. No pedal edema. Gastrointestinal system: Abdomen is nondistended, soft and nontender. No organomegaly or masses felt. Normal bowel sounds heard. Central nervous system: not awake or oriented Extremities: unable to assess Skin: jaundiced Psychiatry: unable to assess    Data Reviewed: I have personally reviewed following labs and imaging studies  CBC:  Recent Labs Lab 02/09/2016 0814 February 27, 2016 0517  WBC 16.5* 19.5*  NEUTROABS 14.0*  --   HGB 6.8* 6.7*  HCT 20.7* 19.6*  MCV 95.0 90.7  PLT 16* 17*   Basic Metabolic Panel:  Recent Labs Lab 02/08/2016 0814 02/01/2016 1755 Feb 27, 2016 0517  NA 135* 135 139  K 6.1* 6.0* 6.4*  CL  --  106 108  CO2 12* 12* 9*  GLUCOSE 115 102* 147*  BUN 108.1* 115* 134*  CREATININE 3.2* 2.97* 2.86*  CALCIUM 8.9 7.5* 8.0*  MG 2.9*  --   --    GFR: Estimated Creatinine Clearance: 21.2 mL/min (by C-G formula based on SCr of 2.86 mg/dL (H)). Liver Function Tests:  Recent Labs Lab 01/24/2016 0814  AST 137*  ALT 38  ALKPHOS 465*  BILITOT 5.17*  PROT 5.9*  ALBUMIN 2.4*   No results for input(s): LIPASE, AMYLASE in the last 168 hours.  Recent Labs Lab 01/23/2016 1216 02/27/16 0517  AMMONIA 57* 125*   Coagulation Profile: No results for input(s): INR, PROTIME in the last 168 hours. Cardiac Enzymes: No results for input(s): CKTOTAL, CKMB, CKMBINDEX, TROPONINI in the last 168 hours. BNP (last 3 results) No results for input(s): PROBNP in the last 8760  hours. HbA1C: No results for input(s): HGBA1C in the last 72 hours. CBG: No results for input(s): GLUCAP in the last 168 hours. Lipid Profile: No results for input(s): CHOL, HDL, LDLCALC, TRIG, CHOLHDL, LDLDIRECT in the last 72 hours. Thyroid Function Tests: No results for input(s): TSH, T4TOTAL, FREET4, T3FREE, THYROIDAB in the last 72 hours. Anemia Panel: No results for input(s): VITAMINB12, FOLATE, FERRITIN, TIBC, IRON, RETICCTPCT in the last 72 hours. Sepsis Labs: No results for input(s): PROCALCITON, LATICACIDVEN in the last 168 hours.  Recent Results (from the past 240 hour(s))  TECHNOLOGIST REVIEW     Status: None   Collection Time: 02/05/16  8:10 AM  Result Value Ref Range Status   Technologist Review 1% metamyelocyte, 1% myelocyte  Final  TECHNOLOGIST REVIEW     Status: None   Collection Time: 01/31/2016  8:14 AM  Result Value Ref Range Status   Technologist Review Oc  meta  Final         Radiology Studies: US Renal  Result Date: 02-27-16 CLINICAL DATA:  Acute kidney injury. EXAM: RENAL / URINARY TRACT  ULTRASOUND COMPLETE COMPARISON:  CT 02/02/2016 FINDINGS: Right Kidney: Length: 10.1 cm. There is thinning of renal parenchyma and increased echogenicity. No mass, shadowing calculus, or hydronephrosis visualized. Left Kidney: Length: 11.7 cm. There is minute thinning of renal parenchyma and increased renal echogenicity. No mass, shadowing calculus, or hydronephrosis visualized. Bladder: Appears normal for degree of bladder distention. IMPRESSION: 1. No hydronephrosis or obstructive uropathy. 2. Thinning of renal parenchyma and increased echogenicity consistent with chronic medical renal disease. Electronically Signed   By: Jeb Levering M.D.   On: 03-09-16 00:23        Scheduled Meds: Continuous Infusions:   LOS: 1 day    Time spent: 30 minutes    Loretha Stapler, MD [facility] Pager [phone]  If 7PM-7AM, please contact  night-coverage [email or web address] Password [email or web address] 2016/03/09, 7:50 AM

## 2016-02-16 NOTE — H&P (Signed)
Sent a message to Dr. Hilbert Bible that Mr.Macmaster has a Hgb of 6.7, and PLTC of 17 despite receiving 2 units of blood today. He also continues to have a low K+ of 6.4.

## 2016-02-16 NOTE — Significant Event (Signed)
CRITICAL VALUE ALERT  Critical value received:  Hgb 6.7,Platelets 17  Date of notification:  2016/02/14  Time of notification:  0600  Critical value read back:yes  Nurse who received alert:  Shawnie Dapper  MD notified (1st page): Fransisca Connors  Time of first page:  0610  MD notified (2nd page):  Time of second page:  Responding MD:  Fransisca Connors  Time MD responded:  [id number]

## 2016-02-16 NOTE — Progress Notes (Signed)
CSW consulted to assist with residential hospice home placement. PN reviewed. Palliative Care Team is following. Hospital death is anticipated. CSW met with spouse / brother at bedside. Family requested [facility] placement. [place] has no openings at this time. If pt is stable for transport, CSW will contact [facility] tomorrow to check bed availability. MD updated and reports hospital death expected today.  Werner Lean LCSW [phone]

## 2016-02-16 NOTE — Significant Event (Signed)
Rapid Response Event Note  Overview: Time Called: 6295 Arrival Time: 0247 Event Type: Cardiac, Respiratory  Initial Focused Assessment: Rapid response called for patient due to labored breathing and tachycardia. On arrival to patient's room, patient was in bed with eyes closed. His work of breathing look labored with respiation of 25. His oxygen saturation was 100% on non-rebreather mask, BP 123/50 (72). Patient was drowsy but was responsive to his name, he was able to follow simple commands. lung sound clear but diminished bilaterally. Of note patient is a DNR- which his wife at bedside confirmed.  Interventions: He was placed on 4L [place] and his saturation was 100%. EKG showed sinus tach, his HR dropped to the 100s.  Emotional support was provided both to the patient and his wife. At this time patient respiration has become normal and even. Plan of Care (if not transferred): Patient to remain on the unit, and call rapid response if needed.  Event Summary: Name of Physician Notified: Chaney Malling, NP at 0250    at    Outcome: Stayed in room and stabalized  Event End Time: 0303  Richrd Humbles

## 2016-02-16 NOTE — Progress Notes (Signed)
Mr. Canavan became agitated and was physically trying to get out of the bed to void. He appeared confused and agitated. This RN explained to him that he needed to stay in bed to avoid the risk of falling, but he did not appear to understand. With the assistance of the NT, he was placed back into the bed.  At this time, noted increased SOB with elevated apical pulse of between 90 to 180. The charge nurse Neil Crouch, RN-Charge Nurse called the Rapid response team and Mr Brooks was placed on non-re-breather mask.  EKG could not be read due to artifact. Presently his HR is 102 and SPO2 is 98. Currently on 4 liters of O2 via nasal cannnula. Sleeping with greater than 30 degree elevation

## 2016-02-16 NOTE — Significant Event (Signed)
CRITICAL VALUE ALERT  Critical value received:  Potassium 6.4  Date of notification:  2016-02-15  Time of notification:  0615  Critical value read back:yes Nurse who received alert:  Shawnie Dapper  MD notified (1st page):  Fransisca Connors  Time of first page:  0617  MD notified (2nd page):  Time of second page:  Responding MD:  Fransisca Connors  Time MD responded:  [id number]

## 2016-02-16 NOTE — Progress Notes (Signed)
Dilaudid gtt discontinued with 43m's  to waste. Dilaudid wasted in sink verified by RAldean BakerRN and EMarta LamasRN.

## 2016-02-16 NOTE — Progress Notes (Signed)
   03/04/2016 1300  Clinical Encounter Type  Visited With Family  Visit Type Initial;Psychological support;Spiritual support;Death  Referral From Nurse  Consult/Referral To Chaplain  Spiritual Encounters  Spiritual Needs Emotional;Grief support;Other (Comment) (Pastoral Conversation/Support)  Stress Factors  Patient Stress Factors Not reviewed  Family Stress Factors Loss;Major life changes   I visited with the patient's family per referral by the Charge Nurse who stated that the patient had recently passed away.  I provided brief grief support and the family stated that they would be ok.   Please, contact Spiritual Care for further assistance.  [place] M.Div.

## 2016-02-16 NOTE — Discharge Summary (Signed)
Death Summary  TRAVARIS KOSH CXK:[id number] DOB: 1950-12-15 DOA: 04-Mar-2016  PCP: [name], MD  Admit date: Mar 04, 2016 Date of Death: 03-05-16 Time of Death: 1323-05-18 Notification: [name], MD notified of death of Mar 05, 2016   History of present illness:  NIZAR CUTLER is a 66 y.o. male with a history of small cell lung cancer, extensive mediastinal lymphadenopathy, extensive liver metastases, retroperitoneal lymphadenopathy and is no longer candidate for chemotherapy (per oncology). Reuben E Steckman presented with complaint of acute encephalopathy and AKI. Eston E Garside did not improve overnight.  On admission found to be very anemic, hyperkalemic and uremic.  Family opted to attempt aggressive IVF resuscitation and PRBC transfusion if needed.  Patient did require transfusion and even with previously mentioned interventions showed no improvement.  Family requested TRH to place patient on comfort measures only in am of 03-06-2023.  Patient's brother and wife understand that patient is actively dying and were prepared for hospital death.  Final Diagnoses:  1.   Small Cell Lung Cancer 2. Uremia 3. Respiratory failure 4. Hyperkalemia   The results of significant diagnostics from this hospitalization (including imaging, microbiology, ancillary and laboratory) are listed below for reference.    Significant Diagnostic Studies: Ct Abdomen Pelvis Wo Contrast  Result Date: 02/02/2016 CLINICAL DATA:  Subsequent treatment evaluation for lung cancer. Chemotherapy in progress. Radiation therapy complete. EXAM: CT CHEST, ABDOMEN AND PELVIS WITHOUT CONTRAST TECHNIQUE: Multidetector CT imaging of the chest, abdomen and pelvis was performed following the standard protocol without IV contrast. COMPARISON:  CT 12/01/2015 FINDINGS: CT CHEST FINDINGS Cardiovascular: Coronary artery calcification and aortic atherosclerotic calcification. Mediastinum/Nodes: Enlarge RIGHT  supraclavicular lymphadenopathy. 10 mm node on image 6, series 2 increased from 8 mm. No mediastinal adenopathy. RIGHT hilum is difficult to assess. Lungs/Pleura: Small moderate RIGHT pleural effusion is increased mildly. No discrete pulmonary nodularity. Branching nodular pattern in the RIGHT lower lobe is not changed in mild bronchiectasis in the LEFT lower lobe medially. Scarring at the lung bases CT ABDOMEN PELVIS FINDINGS Hepatobiliary: Although exam is noncontrast lesion in the RIGHT hepatic lobe measures increased in size at 4.3 x 3.6 cm compared to 3.5 x 2.8 cm. The multiple lesions seen on comparison exam are not well demonstrated on this noncontrast exam. Liver has a nodular contour. Pancreas: No focal pancreatic lesion.  No duct dilatation Spleen: Normal Adrenals/Urinary Tract: Adrenal glands normal. Kidneys, ureters bladder normal. Contrast Stomach/Bowel: Stomach, small-bowel, cecum normal. No small bowel obstruction. Fecal rectosigmoid colon. Vascular/Lymphatic: Abdominal aorta normal caliber. Multiple periaortic lymph nodes s are light increase in size compared to prior. Example periaortic lymph node at the level of the celiac trunk the LEFT measures 8 mm short axis increased from 6 mm (image 63, series 2). Lymph node positioned between the IVC and aorta measures 9 mm (image 66, series 2) increased from 4 mm. No pelvic lymphadenopathy.  No inguinal adenopathy. Reproductive: Prostate normal Other: No peritoneal metastasis. Enlargement retrocrural node measures 11 mm (image 51, series 2 increased from 8 mm. Musculoskeletal: No aggressive osseous lesion IMPRESSION: Chest Impression: 1. Increased RIGHT supraclavicular lymphadenopathy. 2. Small moderate RIGHT pleural effusion is mildly increased. 3. Bronchiectasis and inflammatory nodularity unchanged. Abdomen / Pelvis Impression: 1. Interval increase in size of dominant lesion the RIGHT hepatic lobe consistent with metastatic disease progression. 2.  Nodular liver. 3. Increase in periaortic retroperitoneal adenopathy at the level of the renal veins and celiac trunk. 4. Retrocrural adenopathy increased. Electronically Signed   By: Helane Gunther.D.  On: 02/02/2016 16:54   Ct Chest Wo Contrast  Result Date: 02/02/2016 CLINICAL DATA:  Subsequent treatment evaluation for lung cancer. Chemotherapy in progress. Radiation therapy complete. EXAM: CT CHEST, ABDOMEN AND PELVIS WITHOUT CONTRAST TECHNIQUE: Multidetector CT imaging of the chest, abdomen and pelvis was performed following the standard protocol without IV contrast. COMPARISON:  CT 12/01/2015 FINDINGS: CT CHEST FINDINGS Cardiovascular: Coronary artery calcification and aortic atherosclerotic calcification. Mediastinum/Nodes: Enlarge RIGHT supraclavicular lymphadenopathy. 10 mm node on image 6, series 2 increased from 8 mm. No mediastinal adenopathy. RIGHT hilum is difficult to assess. Lungs/Pleura: Small moderate RIGHT pleural effusion is increased mildly. No discrete pulmonary nodularity. Branching nodular pattern in the RIGHT lower lobe is not changed in mild bronchiectasis in the LEFT lower lobe medially. Scarring at the lung bases CT ABDOMEN PELVIS FINDINGS Hepatobiliary: Although exam is noncontrast lesion in the RIGHT hepatic lobe measures increased in size at 4.3 x 3.6 cm compared to 3.5 x 2.8 cm. The multiple lesions seen on comparison exam are not well demonstrated on this noncontrast exam. Liver has a nodular contour. Pancreas: No focal pancreatic lesion.  No duct dilatation Spleen: Normal Adrenals/Urinary Tract: Adrenal glands normal. Kidneys, ureters bladder normal. Contrast Stomach/Bowel: Stomach, small-bowel, cecum normal. No small bowel obstruction. Fecal rectosigmoid colon. Vascular/Lymphatic: Abdominal aorta normal caliber. Multiple periaortic lymph nodes s are light increase in size compared to prior. Example periaortic lymph node at the level of the celiac trunk the LEFT measures 8  mm short axis increased from 6 mm (image 63, series 2). Lymph node positioned between the IVC and aorta measures 9 mm (image 66, series 2) increased from 4 mm. No pelvic lymphadenopathy.  No inguinal adenopathy. Reproductive: Prostate normal Other: No peritoneal metastasis. Enlargement retrocrural node measures 11 mm (image 51, series 2 increased from 8 mm. Musculoskeletal: No aggressive osseous lesion IMPRESSION: Chest Impression: 1. Increased RIGHT supraclavicular lymphadenopathy. 2. Small moderate RIGHT pleural effusion is mildly increased. 3. Bronchiectasis and inflammatory nodularity unchanged. Abdomen / Pelvis Impression: 1. Interval increase in size of dominant lesion the RIGHT hepatic lobe consistent with metastatic disease progression. 2. Nodular liver. 3. Increase in periaortic retroperitoneal adenopathy at the level of the renal veins and celiac trunk. 4. Retrocrural adenopathy increased. Electronically Signed   By: Suzy Bouchard M.D.   On: 02/02/2016 16:54   US Renal  Result Date: Mar 11, 2016 CLINICAL DATA:  Acute kidney injury. EXAM: RENAL / URINARY TRACT ULTRASOUND COMPLETE COMPARISON:  CT 02/02/2016 FINDINGS: Right Kidney: Length: 10.1 cm. There is thinning of renal parenchyma and increased echogenicity. No mass, shadowing calculus, or hydronephrosis visualized. Left Kidney: Length: 11.7 cm. There is minute thinning of renal parenchyma and increased renal echogenicity. No mass, shadowing calculus, or hydronephrosis visualized. Bladder: Appears normal for degree of bladder distention. IMPRESSION: 1. No hydronephrosis or obstructive uropathy. 2. Thinning of renal parenchyma and increased echogenicity consistent with chronic medical renal disease. Electronically Signed   By: Jeb Levering M.D.   On: Mar 11, 2016 00:23    Microbiology: Recent Results (from the past 240 hour(s))  TECHNOLOGIST REVIEW     Status: None   Collection Time: 02/05/16  8:10 AM  Result Value Ref Range Status    Technologist Review 1% metamyelocyte, 1% myelocyte  Final  TECHNOLOGIST REVIEW     Status: None   Collection Time: 01/20/2016  8:14 AM  Result Value Ref Range Status   Technologist Review Oc  meta  Final     Labs: Basic Metabolic Panel:  Recent Labs Lab 02/04/2016 [phone]  02/02/2016 1755 03/04/16 0517  NA 135* 135 139  K 6.1* 6.0* 6.4*  CL  --  106 108  CO2 12* 12* 9*  GLUCOSE 115 102* 147*  BUN 108.1* 115* 134*  CREATININE 3.2* 2.97* 2.86*  CALCIUM 8.9 7.5* 8.0*  MG 2.9*  --   --    Liver Function Tests:  Recent Labs Lab 01/16/2016 0814  AST 137*  ALT 38  ALKPHOS 465*  BILITOT 5.17*  PROT 5.9*  ALBUMIN 2.4*   No results for input(s): LIPASE, AMYLASE in the last 168 hours.  Recent Labs Lab 01/28/2016 1216 03/04/2016 0517  AMMONIA 57* 125*   CBC:  Recent Labs Lab 01/20/2016 0814 2016-03-04 0517  WBC 16.5* 19.5*  NEUTROABS 14.0*  --   HGB 6.8* 6.7*  HCT 20.7* 19.6*  MCV 95.0 90.7  PLT 16* 17*   Cardiac Enzymes: No results for input(s): CKTOTAL, CKMB, CKMBINDEX, TROPONINI in the last 168 hours. D-Dimer No results for input(s): DDIMER in the last 72 hours. BNP: Invalid input(s): POCBNP CBG: No results for input(s): GLUCAP in the last 168 hours. Anemia work up No results for input(s): VITAMINB12, FOLATE, FERRITIN, TIBC, IRON, RETICCTPCT in the last 72 hours. Urinalysis    Component Value Date/Time   COLORURINE YELLOW 10/27/2015 1204   APPEARANCEUR CLOUDY (A) 10/27/2015 1204   LABSPEC 1.018 10/27/2015 1204   PHURINE 5.0 10/27/2015 1204   GLUCOSEU NEGATIVE 10/27/2015 1204   HGBUR MODERATE (A) 10/27/2015 1204   HGBUR negative 08/05/2009 0828   BILIRUBINUR NEGATIVE 10/27/2015 1204   BILIRUBINUR n 11/23/2011 1145   KETONESUR NEGATIVE 10/27/2015 1204   PROTEINUR NEGATIVE 10/27/2015 1204   UROBILINOGEN 0.2 07/26/2014 1953   NITRITE NEGATIVE 10/27/2015 1204   LEUKOCYTESUR NEGATIVE 10/27/2015 1204   Sepsis Labs Invalid input(s): PROCALCITONIN,  WBC,   LACTICIDVEN     SIGNED:  Loretha Stapler, MD  [facility] Mar 04, 2016, 4:35 PM Pager [phone]  If 7PM-7AM, please contact night-coverage [email or web address] Password [email or web address]

## 2016-02-16 DEATH — deceased

## 2016-02-17 ENCOUNTER — Ambulatory Visit: Payer: Medicare Other

## 2016-06-10 IMAGING — CR DG CHEST 2V
2 series · 2 of 2 positions shown · non-contrast
Comparison: Chest x-ray of 09/19/2014

CLINICAL DATA: History of left VA TS for empyema, some cough and
chest congestion currently

EXAM:
CHEST  2 VIEW

[w chest pa]
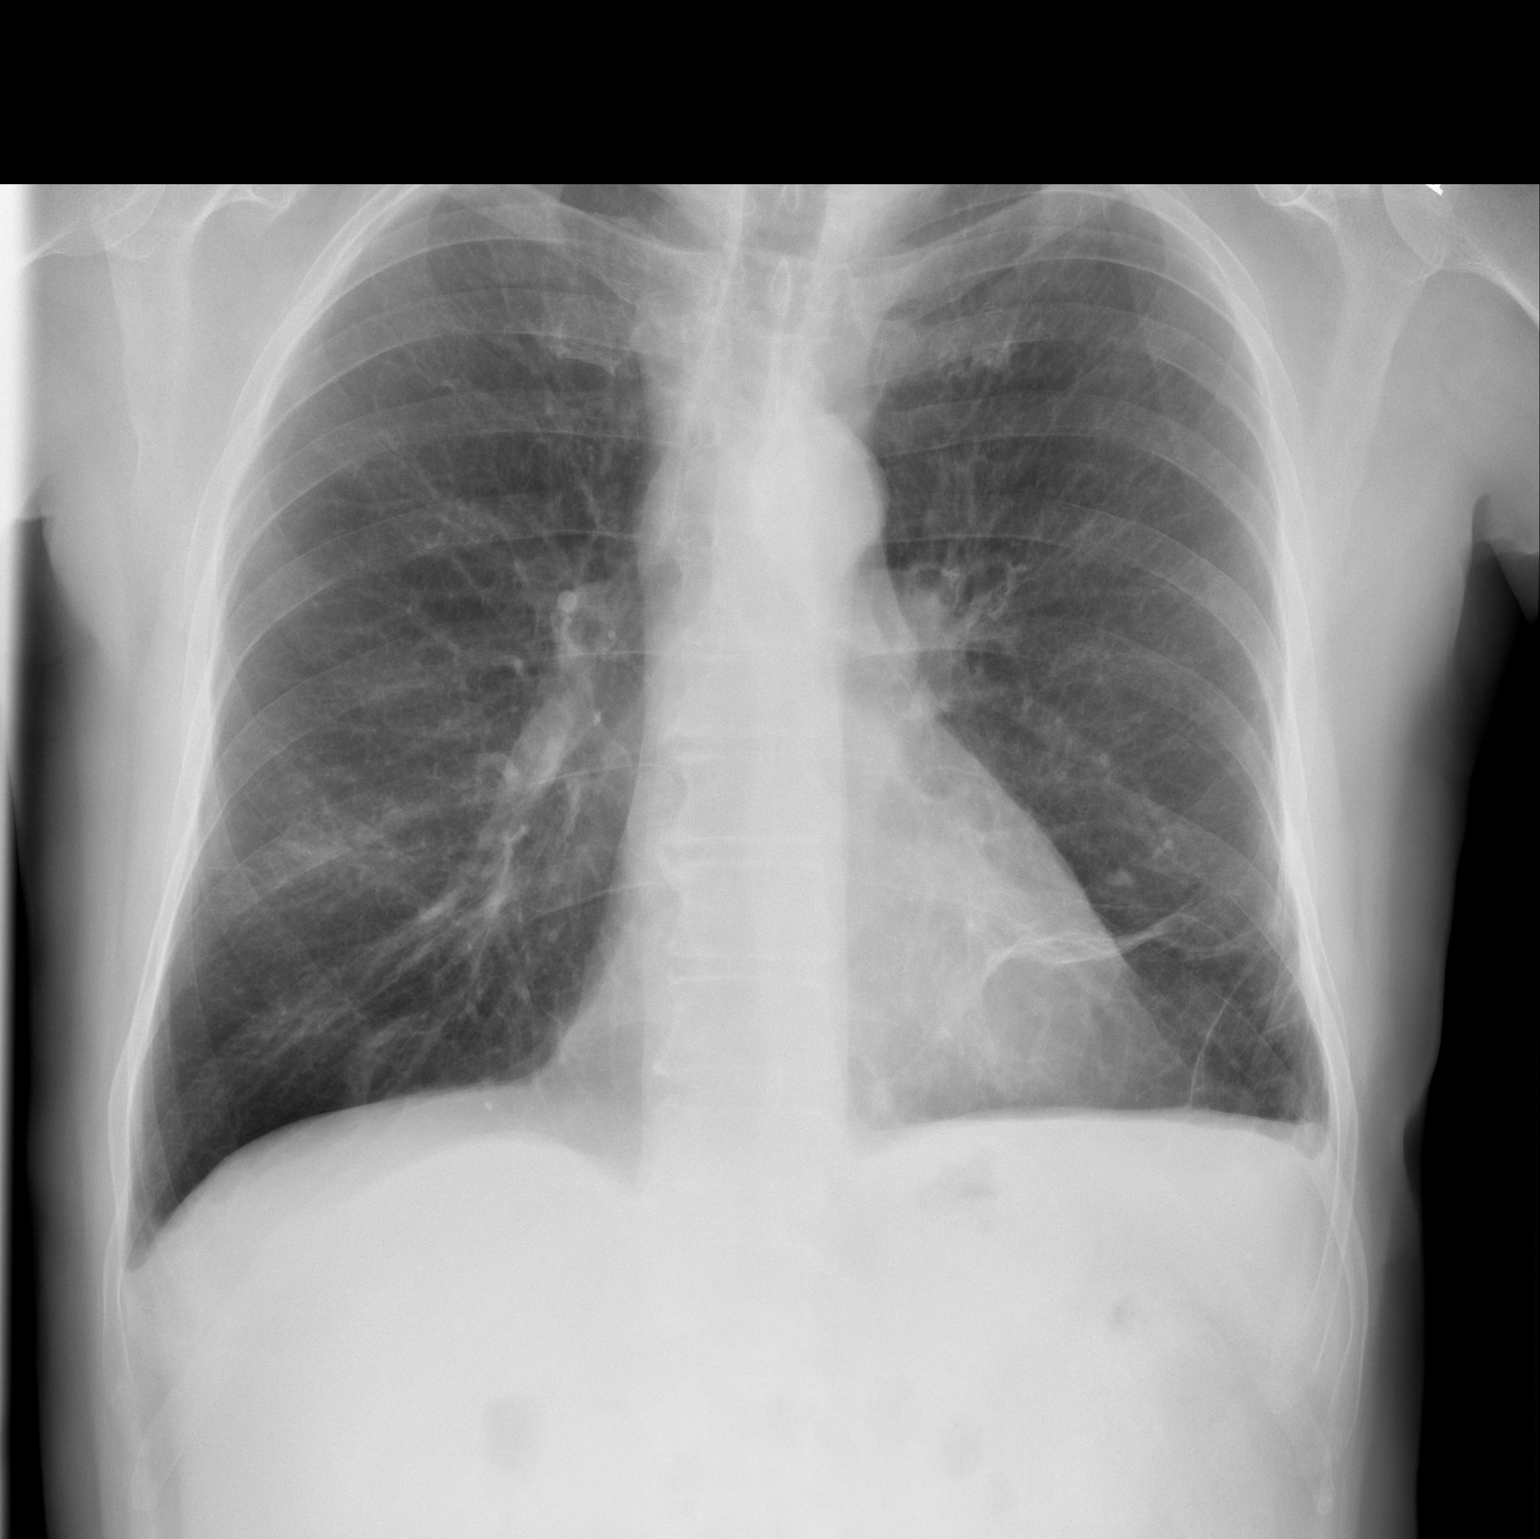

[w chest lat]
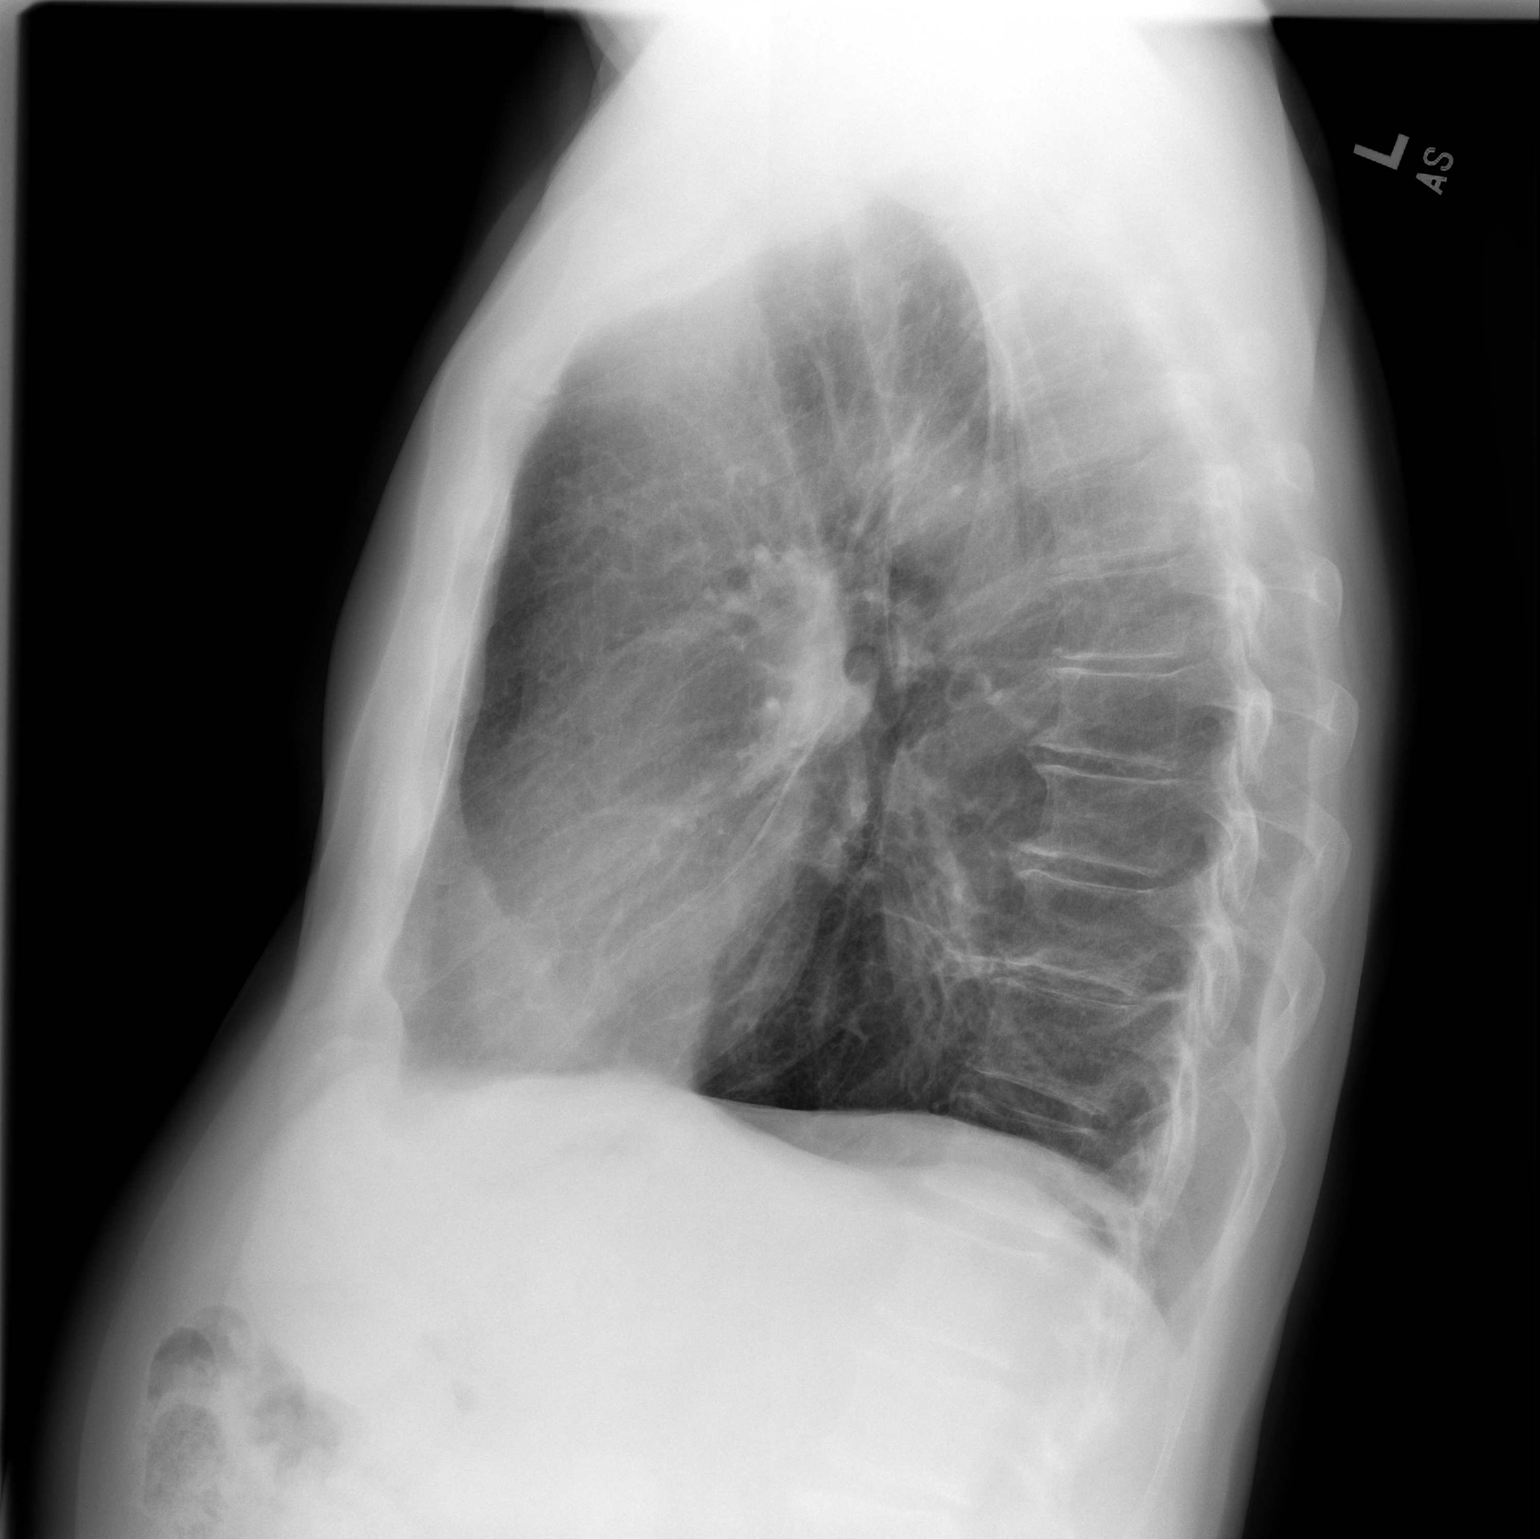

[2 of 2 positions shown; findings below may reference images not displayed]

FINDINGS: Aeration of the left lung base has improved with only linear
atelectasis remaining. There may be a tiny left pleural effusion
remaining. The right lung is clear. Mediastinal and hilar contours
are unchanged. The heart is within normal limits in size. No acute
bony abnormality is seen.
IMPRESSION: Improved aeration of the left lung base with linear atelectasis
remaining. Possible tiny left effusion as well.

## 2017-05-09 IMAGING — CT CT ABD-PELV W/ CM
2 of 5 series · 14 of 46 positions shown, 16 images · IV contrast (ISOVUE)
Comparison: Multiple exams, including 09/18/2015

CLINICAL DATA: Chemotherapy in progress for small cell right-sided
lung cancer. Restaging assessment.

EXAM:
CT CHEST, ABDOMEN, AND PELVIS WITH CONTRAST
TECHNIQUE: Multidetector CT imaging of the chest, abdomen and pelvis was
performed following the standard protocol during bolus
administration of intravenous contrast.
CONTRAST:  100mL [id number] IOPAMIDOL ([id number]) INJECTION 61%

[Series 2: cap with st · axial · 0.77mm/px · z∈[+696,+1220]mm · 11 of 127 slices shown, 13 images]
[im 11/127  soft-tissue]
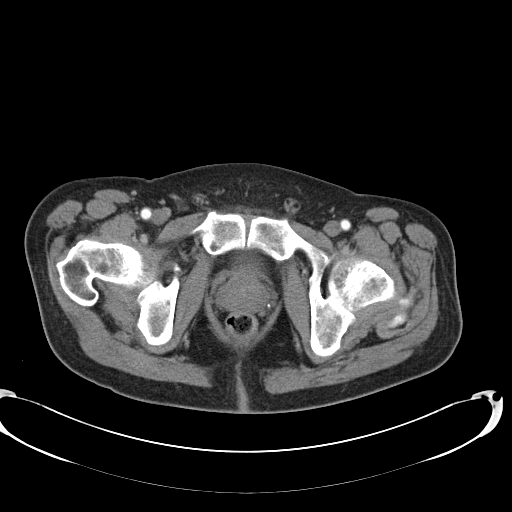
[im 11/127  bone]
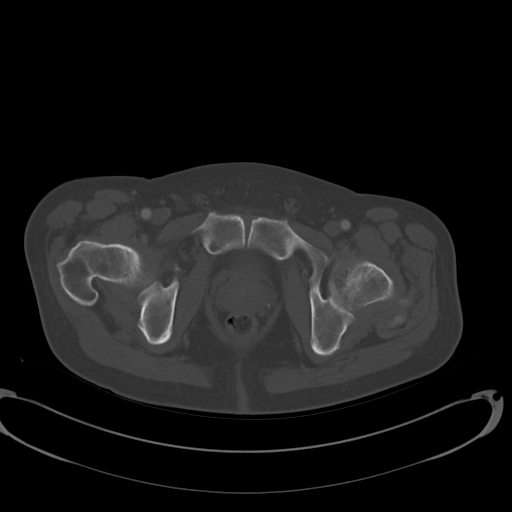
[im 22/127  soft-tissue]
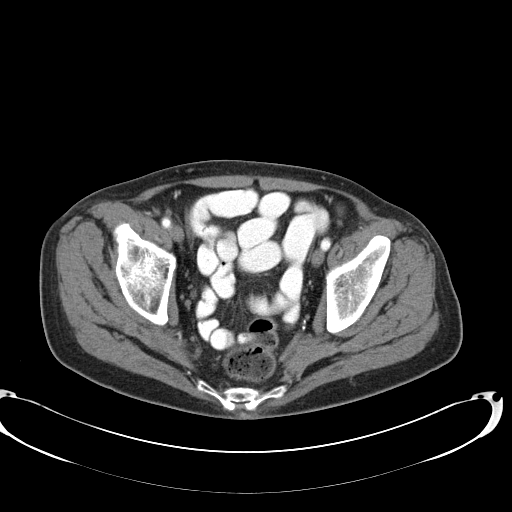
[im 32/127  soft-tissue]
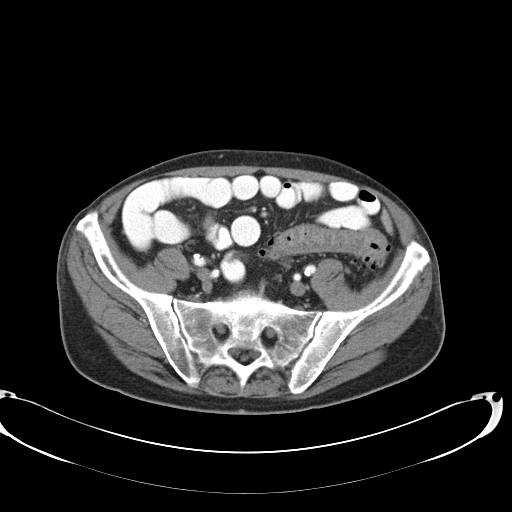
[im 43/127  soft-tissue]
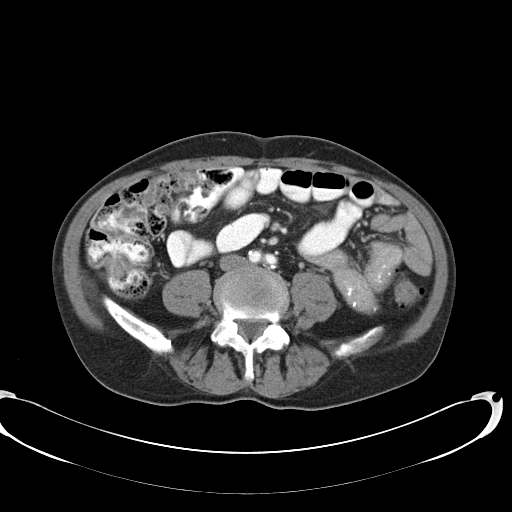
[im 53/127  soft-tissue]
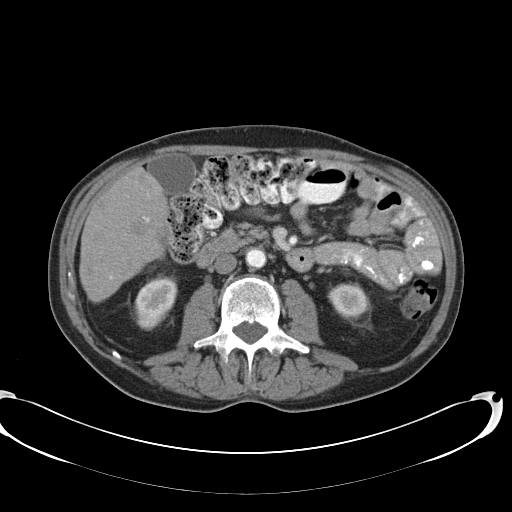
[im 64/127  soft-tissue]
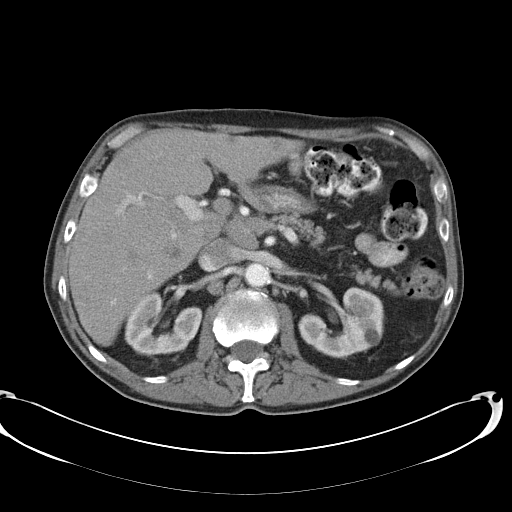
[im 74/127  soft-tissue]
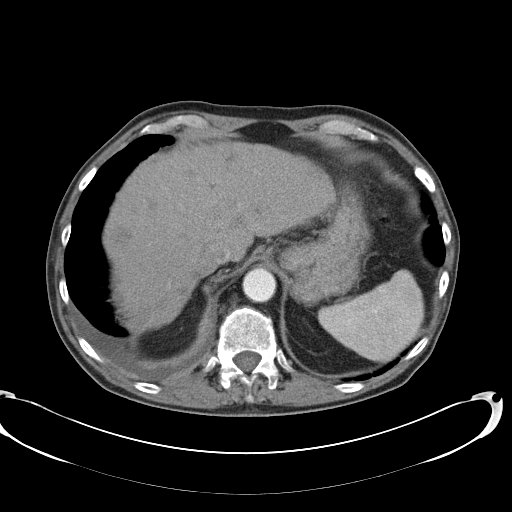
[im 85/127  soft-tissue]
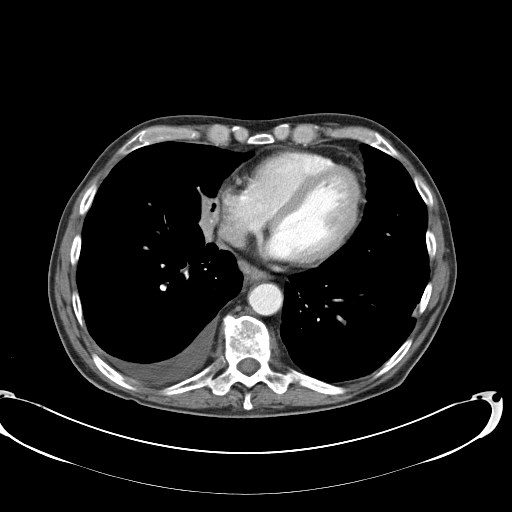
[im 95/127  soft-tissue]
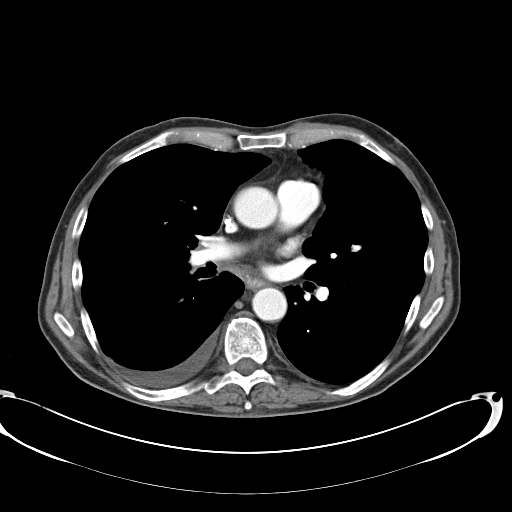
[im 95/127  bone]
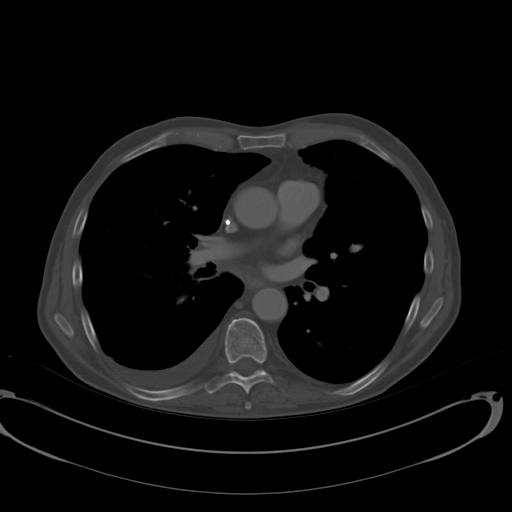
[im 106/127  soft-tissue]
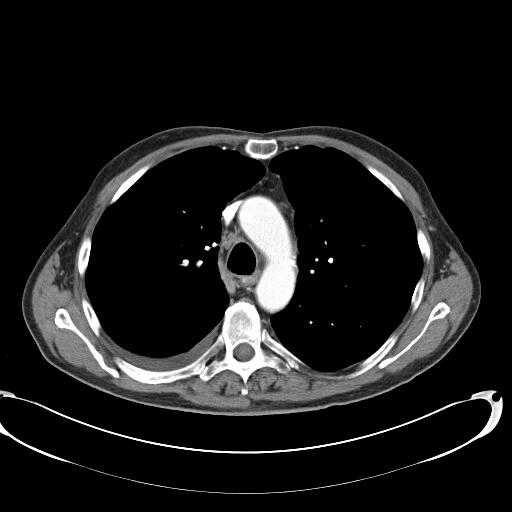
[im 116/127  soft-tissue]
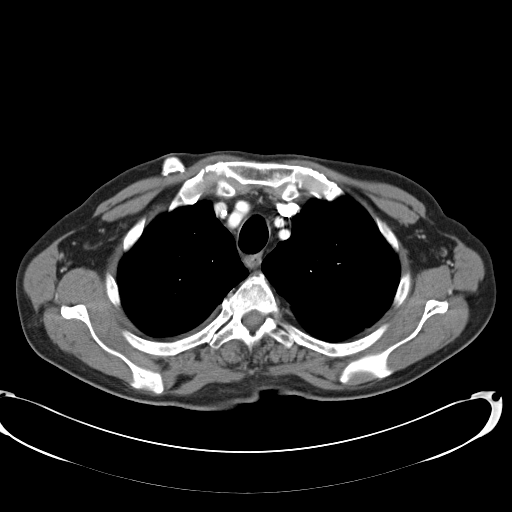

[Series 602: <mpr thick range> · coronal · 1.24mm/px · 3 of 126 slices shown]
[im 42/126  soft-tissue]
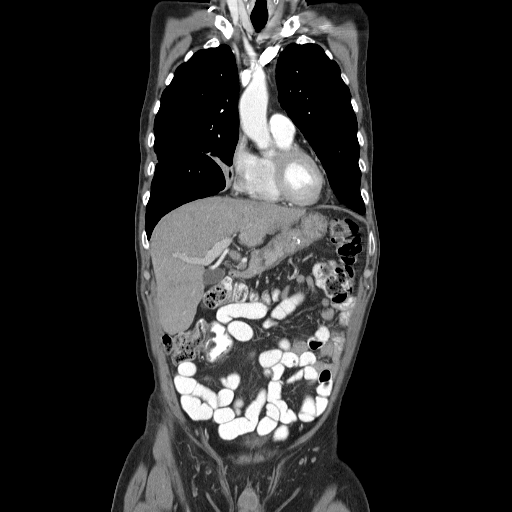
[im 56/126  soft-tissue]
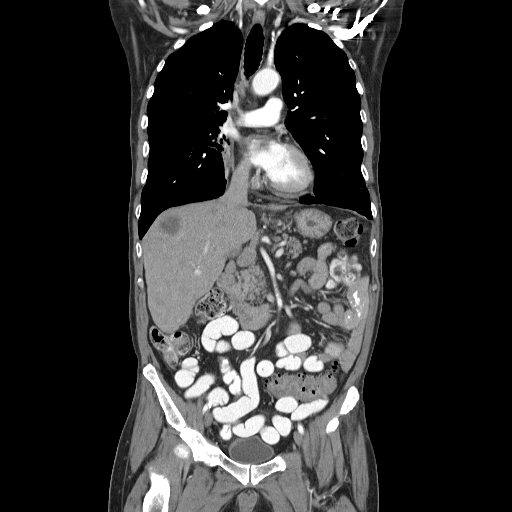
[im 70/126  soft-tissue]
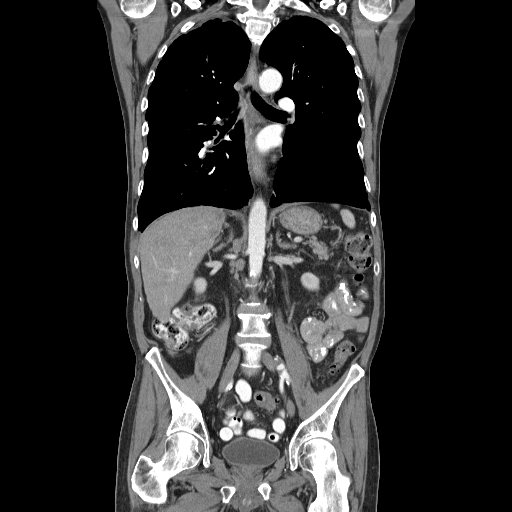

[14 of 46 positions shown; findings below may reference images not displayed]

FINDINGS: CT CHEST FINDINGS

Cardiovascular: Mild left anterior descending coronary artery
atherosclerotic calcification.

Mediastinum/Nodes: Right paratracheal lymph node 0.4 cm in short
axis on image [DATE], formerly 1.4 cm. Right lower paratracheal lymph
node 0.6 cm in short axis on image [DATE], formerly 2.8 cm. Subcarinal
lymph node short axis diameter 0.8 cm in short axis, formerly
cm. Right hilar and infrahilar adenopathy are also markedly
improved.

Lungs/Pleura: Moderate right-sided pleural effusion with inferior
pleural tumor along the dependent portions of the right pleural
space. However, this does appear improved, measuring only about 5 mm
and rind like thickness as opposed to prior measurements of up to 10
mm. Nodularity along the pleural adipose tissue on the right is also
reduced.

Biapical pleuroparenchymal scarring. Centrilobular emphysema. The
previously occluded right middle lobe bronchus is now patent
although there still is a considerable amount of right middle lobe
volume loss/ atelectasis, involving almost all the right middle
lobe. Airway thickening noted bilaterally. Scarring in the lingula
and left lower lobe.

Musculoskeletal: Subtle heterogeneity of density in various vertebra
and in the ribs, compatible with osseous metastatic disease.

CT ABDOMEN PELVIS FINDINGS

Hepatobiliary: Innumerable metastatic lesions are scattered
throughout all segments of the liver. The margins of these lesions
are less sharply defined than on the prior exam. Dominant right
hepatic lobe lesion measures 3.5 cm in the anterior- posterior
dimension, previously 3.3 cm. This lesion demonstrates central
necrosis although the smaller lesions do not. Some other lesions are
reduced in size, for example a segment 4 a lesion measuring 2 cm in
diameter today measured 2.7 cm previously. Although the overall
appearance is generally next there is seen to be more lesions and
overall I feel that the tumor burden in the liver is worsened.

Pancreas: Unremarkable

Spleen: Unremarkable

Adrenals/Urinary Tract: 1.3 cm sharply defined fluid density lesion
in the left mid kidney, not appreciably changed from prior, favoring
cyst.

Stomach/Bowel: Sigmoid colon diverticulosis.

Vascular/Lymphatic: Aortoiliac atherosclerotic vascular disease.
Notable retrocrural lymph nodes including a 8 mm in short axis node
on image 55/2. Lymph node to the right of the IVC at the level the
right renal artery measures 1.0 cm in short axis, previously 0.7 cm.
Portacaval lymph node measures 1.7 cm in short axis, formerly 0.7 cm
in short axis.

Reproductive: Unremarkable

Other: No supplemental non-categorized findings.

Musculoskeletal: Remote mild superior endplate compression fracture
of the L1 vertebral body, 30% loss of vertebral body height without
significant bony retropulsion. This is stable compared to the prior
exam. 1.2 cm sclerotic lesion of the right iliac crest. Similar mild
sclerosis in the left iliac crest. Faint sclerosis at the right
ischial tuberosity.
IMPRESSION: 1. Mixed response, with significant improvement in malignant
involvement in the thorax including the pleural metastatic disease
and adenopathy; but with some new and increasing hepatic metastatic
lesions, as well as some progressive adenopathy in the abdomen
compatible with metastatic disease. The primary lesion in the right
infrahilar region is markedly improved but difficult to separately
measure from the surrounding atelectasis.
2. There is some scattered faint areas of sclerosis in the ribs,
thoracic vertebra, and bony pelvis, suspicious for subtle osseous
metastatic disease. Consider bone scan or nuclear medicine PET-CT
for further characterization.
3. Other imaging findings of potential clinical significance:
Coronary atherosclerosis. Aortoiliac atherosclerotic vascular
disease. Moderate-sized right pleural effusion. Remote superior
endplate compression fracture at L1. Sigmoid diverticulosis.

## 2018-06-24 ENCOUNTER — Encounter
